# Patient Record
Sex: Female | Born: 1935 | Race: White | Hispanic: No | Smoking: Former smoker
Health system: Southern US, Community
[De-identification: ages and names within clinical notes are randomized; demographics above are authoritative.]

## PROBLEM LIST (undated history)

## (undated) DIAGNOSIS — I5032 Chronic diastolic (congestive) heart failure: Secondary | ICD-10-CM

## (undated) DIAGNOSIS — Z8709 Personal history of other diseases of the respiratory system: Secondary | ICD-10-CM

## (undated) DIAGNOSIS — E7800 Pure hypercholesterolemia, unspecified: Secondary | ICD-10-CM

## (undated) DIAGNOSIS — C50919 Malignant neoplasm of unspecified site of unspecified female breast: Secondary | ICD-10-CM

## (undated) DIAGNOSIS — I509 Heart failure, unspecified: Secondary | ICD-10-CM

## (undated) DIAGNOSIS — IMO0001 Reserved for inherently not codable concepts without codable children: Secondary | ICD-10-CM

## (undated) DIAGNOSIS — R918 Other nonspecific abnormal finding of lung field: Secondary | ICD-10-CM

## (undated) DIAGNOSIS — R4189 Other symptoms and signs involving cognitive functions and awareness: Secondary | ICD-10-CM

## (undated) DIAGNOSIS — J90 Pleural effusion, not elsewhere classified: Secondary | ICD-10-CM

## (undated) DIAGNOSIS — R011 Cardiac murmur, unspecified: Secondary | ICD-10-CM

## (undated) DIAGNOSIS — I639 Cerebral infarction, unspecified: Secondary | ICD-10-CM

## (undated) DIAGNOSIS — R202 Paresthesia of skin: Secondary | ICD-10-CM

## (undated) DIAGNOSIS — R200 Anesthesia of skin: Secondary | ICD-10-CM

## (undated) HISTORY — PX: CORONARY ANGIOPLASTY WITH STENT PLACEMENT: SHX49

## (undated) HISTORY — PX: TONSILLECTOMY: SUR1361

## (undated) HISTORY — PX: COLONOSCOPY: SHX174

## (undated) HISTORY — DX: Chronic diastolic (congestive) heart failure: I50.32

## (undated) HISTORY — PX: CATARACT EXTRACTION: SUR2

## (undated) HISTORY — PX: FOOT SURGERY: SHX648

## (undated) HISTORY — DX: Malignant neoplasm of unspecified site of unspecified female breast: C50.919

---

## 1974-12-22 HISTORY — PX: ABDOMINAL HYSTERECTOMY: SHX81

## 1983-12-23 HISTORY — PX: MASTECTOMY: SHX3

## 2012-12-22 DIAGNOSIS — I639 Cerebral infarction, unspecified: Secondary | ICD-10-CM

## 2012-12-22 HISTORY — DX: Cerebral infarction, unspecified: I63.9

## 2014-12-26 DIAGNOSIS — K5900 Constipation, unspecified: Secondary | ICD-10-CM | POA: Diagnosis not present

## 2014-12-26 DIAGNOSIS — J90 Pleural effusion, not elsewhere classified: Secondary | ICD-10-CM | POA: Diagnosis not present

## 2014-12-26 DIAGNOSIS — R51 Headache: Secondary | ICD-10-CM | POA: Diagnosis not present

## 2014-12-26 DIAGNOSIS — I639 Cerebral infarction, unspecified: Secondary | ICD-10-CM | POA: Diagnosis not present

## 2014-12-26 DIAGNOSIS — E785 Hyperlipidemia, unspecified: Secondary | ICD-10-CM | POA: Diagnosis not present

## 2014-12-26 DIAGNOSIS — R918 Other nonspecific abnormal finding of lung field: Secondary | ICD-10-CM | POA: Diagnosis not present

## 2014-12-26 DIAGNOSIS — R05 Cough: Secondary | ICD-10-CM | POA: Diagnosis not present

## 2015-01-23 DIAGNOSIS — N390 Urinary tract infection, site not specified: Secondary | ICD-10-CM | POA: Diagnosis not present

## 2015-01-23 DIAGNOSIS — I10 Essential (primary) hypertension: Secondary | ICD-10-CM | POA: Diagnosis not present

## 2015-02-06 DIAGNOSIS — B373 Candidiasis of vulva and vagina: Secondary | ICD-10-CM | POA: Diagnosis not present

## 2015-02-06 DIAGNOSIS — R300 Dysuria: Secondary | ICD-10-CM | POA: Diagnosis not present

## 2015-02-06 DIAGNOSIS — N390 Urinary tract infection, site not specified: Secondary | ICD-10-CM | POA: Diagnosis not present

## 2015-02-06 DIAGNOSIS — R609 Edema, unspecified: Secondary | ICD-10-CM | POA: Diagnosis not present

## 2015-02-14 DIAGNOSIS — N390 Urinary tract infection, site not specified: Secondary | ICD-10-CM | POA: Diagnosis not present

## 2015-02-14 DIAGNOSIS — N3000 Acute cystitis without hematuria: Secondary | ICD-10-CM | POA: Diagnosis not present

## 2015-02-27 DIAGNOSIS — N390 Urinary tract infection, site not specified: Secondary | ICD-10-CM | POA: Diagnosis not present

## 2015-07-03 DIAGNOSIS — Z1231 Encounter for screening mammogram for malignant neoplasm of breast: Secondary | ICD-10-CM | POA: Diagnosis not present

## 2015-07-03 DIAGNOSIS — I639 Cerebral infarction, unspecified: Secondary | ICD-10-CM | POA: Diagnosis not present

## 2015-07-03 DIAGNOSIS — K5909 Other constipation: Secondary | ICD-10-CM | POA: Diagnosis not present

## 2015-07-03 DIAGNOSIS — J984 Other disorders of lung: Secondary | ICD-10-CM | POA: Diagnosis not present

## 2015-07-26 DIAGNOSIS — R269 Unspecified abnormalities of gait and mobility: Secondary | ICD-10-CM | POA: Diagnosis not present

## 2015-07-26 DIAGNOSIS — R531 Weakness: Secondary | ICD-10-CM | POA: Diagnosis not present

## 2015-07-26 DIAGNOSIS — Z5189 Encounter for other specified aftercare: Secondary | ICD-10-CM | POA: Diagnosis not present

## 2015-07-31 DIAGNOSIS — R531 Weakness: Secondary | ICD-10-CM | POA: Diagnosis not present

## 2015-07-31 DIAGNOSIS — Z5189 Encounter for other specified aftercare: Secondary | ICD-10-CM | POA: Diagnosis not present

## 2015-07-31 DIAGNOSIS — R269 Unspecified abnormalities of gait and mobility: Secondary | ICD-10-CM | POA: Diagnosis not present

## 2015-08-03 DIAGNOSIS — R269 Unspecified abnormalities of gait and mobility: Secondary | ICD-10-CM | POA: Diagnosis not present

## 2015-08-03 DIAGNOSIS — Z5189 Encounter for other specified aftercare: Secondary | ICD-10-CM | POA: Diagnosis not present

## 2015-08-03 DIAGNOSIS — R531 Weakness: Secondary | ICD-10-CM | POA: Diagnosis not present

## 2015-08-07 DIAGNOSIS — Z5189 Encounter for other specified aftercare: Secondary | ICD-10-CM | POA: Diagnosis not present

## 2015-08-07 DIAGNOSIS — R531 Weakness: Secondary | ICD-10-CM | POA: Diagnosis not present

## 2015-08-07 DIAGNOSIS — R269 Unspecified abnormalities of gait and mobility: Secondary | ICD-10-CM | POA: Diagnosis not present

## 2015-08-10 DIAGNOSIS — R531 Weakness: Secondary | ICD-10-CM | POA: Diagnosis not present

## 2015-08-10 DIAGNOSIS — Z5189 Encounter for other specified aftercare: Secondary | ICD-10-CM | POA: Diagnosis not present

## 2015-08-10 DIAGNOSIS — R269 Unspecified abnormalities of gait and mobility: Secondary | ICD-10-CM | POA: Diagnosis not present

## 2015-08-13 DIAGNOSIS — J984 Other disorders of lung: Secondary | ICD-10-CM | POA: Diagnosis not present

## 2015-08-13 DIAGNOSIS — I712 Thoracic aortic aneurysm, without rupture: Secondary | ICD-10-CM | POA: Diagnosis not present

## 2015-08-13 DIAGNOSIS — J181 Lobar pneumonia, unspecified organism: Secondary | ICD-10-CM | POA: Diagnosis not present

## 2015-08-13 DIAGNOSIS — J90 Pleural effusion, not elsewhere classified: Secondary | ICD-10-CM | POA: Diagnosis not present

## 2015-08-14 DIAGNOSIS — Z5189 Encounter for other specified aftercare: Secondary | ICD-10-CM | POA: Diagnosis not present

## 2015-08-14 DIAGNOSIS — R531 Weakness: Secondary | ICD-10-CM | POA: Diagnosis not present

## 2015-08-14 DIAGNOSIS — R269 Unspecified abnormalities of gait and mobility: Secondary | ICD-10-CM | POA: Diagnosis not present

## 2015-08-21 DIAGNOSIS — J984 Other disorders of lung: Secondary | ICD-10-CM | POA: Diagnosis not present

## 2015-08-28 DIAGNOSIS — D190 Benign neoplasm of mesothelial tissue of pleura: Secondary | ICD-10-CM | POA: Diagnosis not present

## 2015-08-28 DIAGNOSIS — R918 Other nonspecific abnormal finding of lung field: Secondary | ICD-10-CM | POA: Diagnosis not present

## 2015-08-28 DIAGNOSIS — J90 Pleural effusion, not elsewhere classified: Secondary | ICD-10-CM | POA: Diagnosis not present

## 2015-12-31 DIAGNOSIS — Z8673 Personal history of transient ischemic attack (TIA), and cerebral infarction without residual deficits: Secondary | ICD-10-CM | POA: Diagnosis not present

## 2015-12-31 DIAGNOSIS — E7800 Pure hypercholesterolemia, unspecified: Secondary | ICD-10-CM | POA: Diagnosis not present

## 2015-12-31 DIAGNOSIS — R011 Cardiac murmur, unspecified: Secondary | ICD-10-CM | POA: Diagnosis not present

## 2016-03-03 ENCOUNTER — Encounter: Payer: Self-pay | Admitting: Hematology & Oncology

## 2016-03-03 ENCOUNTER — Other Ambulatory Visit (HOSPITAL_BASED_OUTPATIENT_CLINIC_OR_DEPARTMENT_OTHER): Payer: Medicare Other

## 2016-03-03 ENCOUNTER — Ambulatory Visit (HOSPITAL_BASED_OUTPATIENT_CLINIC_OR_DEPARTMENT_OTHER): Payer: Medicare Other | Admitting: Hematology & Oncology

## 2016-03-03 ENCOUNTER — Ambulatory Visit: Payer: Medicare Other

## 2016-03-03 VITALS — BP 146/71 | HR 77 | Temp 97.3°F | Resp 16 | Ht 63.0 in | Wt 134.0 lb

## 2016-03-03 DIAGNOSIS — M818 Other osteoporosis without current pathological fracture: Secondary | ICD-10-CM

## 2016-03-03 DIAGNOSIS — C50912 Malignant neoplasm of unspecified site of left female breast: Secondary | ICD-10-CM

## 2016-03-03 DIAGNOSIS — Z853 Personal history of malignant neoplasm of breast: Secondary | ICD-10-CM

## 2016-03-03 DIAGNOSIS — R899 Unspecified abnormal finding in specimens from other organs, systems and tissues: Secondary | ICD-10-CM

## 2016-03-03 DIAGNOSIS — I427 Cardiomyopathy due to drug and external agent: Secondary | ICD-10-CM

## 2016-03-03 DIAGNOSIS — J90 Pleural effusion, not elsewhere classified: Secondary | ICD-10-CM | POA: Diagnosis not present

## 2016-03-03 DIAGNOSIS — T386X5A Adverse effect of antigonadotrophins, antiestrogens, antiandrogens, not elsewhere classified, initial encounter: Secondary | ICD-10-CM

## 2016-03-03 DIAGNOSIS — T451X5A Adverse effect of antineoplastic and immunosuppressive drugs, initial encounter: Secondary | ICD-10-CM

## 2016-03-03 LAB — COMPREHENSIVE METABOLIC PANEL
ALT: 9 U/L (ref 0–55)
ANION GAP: 8 meq/L (ref 3–11)
AST: 15 U/L (ref 5–34)
Albumin: 3.4 g/dL — ABNORMAL LOW (ref 3.5–5.0)
Alkaline Phosphatase: 79 U/L (ref 40–150)
BUN: 14.2 mg/dL (ref 7.0–26.0)
CALCIUM: 9.1 mg/dL (ref 8.4–10.4)
CHLORIDE: 103 meq/L (ref 98–109)
CO2: 28 meq/L (ref 22–29)
CREATININE: 1.1 mg/dL (ref 0.6–1.1)
EGFR: 46 mL/min/{1.73_m2} — AB (ref >90)
Glucose: 120 mg/dl (ref 70–140)
Potassium: 4.1 mEq/L (ref 3.5–5.1)
Sodium: 139 mEq/L (ref 136–145)
Total Bilirubin: 0.37 mg/dL (ref 0.20–1.20)
Total Protein: 7.2 g/dL (ref 6.4–8.3)

## 2016-03-03 LAB — CBC WITH DIFFERENTIAL (CANCER CENTER ONLY)
BASO#: 0 10*3/uL (ref 0.0–0.2)
BASO%: 0.4 % (ref 0.0–2.0)
EOS ABS: 0.3 10*3/uL (ref 0.0–0.5)
EOS%: 3.5 % (ref 0.0–7.0)
HCT: 41.4 % (ref 34.8–46.6)
HGB: 13.5 g/dL (ref 11.6–15.9)
LYMPH#: 0.7 10*3/uL — ABNORMAL LOW (ref 0.9–3.3)
LYMPH%: 9.3 % — AB (ref 14.0–48.0)
MCH: 29 pg (ref 26.0–34.0)
MCHC: 32.6 g/dL (ref 32.0–36.0)
MCV: 89 fL (ref 81–101)
MONO#: 0.6 10*3/uL (ref 0.1–0.9)
MONO%: 8.6 % (ref 0.0–13.0)
NEUT#: 5.6 10*3/uL (ref 1.5–6.5)
NEUT%: 78.2 % (ref 39.6–80.0)
PLATELETS: 307 10*3/uL (ref 145–400)
RBC: 4.66 10*6/uL (ref 3.70–5.32)
RDW: 13.7 % (ref 11.1–15.7)
WBC: 7.1 10*3/uL (ref 3.9–10.0)

## 2016-03-03 LAB — LACTATE DEHYDROGENASE: LDH: 171 U/L (ref 125–245)

## 2016-03-03 NOTE — Progress Notes (Signed)
Referral MD  Reason for Referral: Left pleural effusion. History of left breast cancer in 1985   Chief Complaint  Patient presents with  . OTHER    New Patient  : I just moved down here from [place].  HPI: Leslie Carr is a very charming 82-year-old white female. She has a very interesting history. She had breast cancer back in 1985. At that time, she had a mastectomy. She says she took chemotherapy and radiation therapy.  She has moved to different places. She was up in [place] when she had a CT scan done. I'm still not sure as to why the scan was done. She is not having any symptoms of shortness of breath. However, the CT scan showed that there was a significant left pleural effusion.  She had a left thoracentesis. This is back in August 2016. The procedure through out 700 mL of fluid. Cytology on the fluid was negative for any obvious malignancy. However, the pathologist noted some "abnormal" T cells. On flow cytometry, there is no monoclonal population of cells. Even the pathologist was not sure what this T-cell population signified.  Because this, when she moved down to [place], her family doctor felt that this needed to be followed up by oncology. As such, we are seeing her for this.  She denies any shortness of breath. She denies any chest pain. Her appetite is doing okay. She has lost a little bit of weight.  She's had no nausea or vomiting. She's had no cough. She's had no bony pain. She's had no leg swelling. She's had no rashes.  Overall, her performance status is ECOG 0.     Past Medical History  Diagnosis Date  . Breast cancer ([place])   :  No past surgical history on file.:   Current outpatient prescriptions:  .  aspirin 81 MG tablet, Take 81 mg by mouth daily., Disp: , Rfl:  .  pravastatin (PRAVACHOL) 20 MG tablet, Take 20 mg by mouth daily., Disp: , Rfl: :  :  Not on File:  No family history on file.:  Social History   Social History  .  Marital Status: Divorced    Spouse Name: N/A  . Number of Children: N/A  . Years of Education: N/A   Occupational History  . Not on file.   Social History Main Topics  . Smoking status: Former Research scientist (life sciences)  . Smokeless tobacco: Not on file     Comment: Quit over 40 years ago  . Alcohol Use: Not on file  . Drug Use: Not on file  . Sexual Activity: Not on file   Other Topics Concern  . Not on file   Social History Narrative  . No narrative on file  :  Pertinent items are noted in HPI.  Exam: @[facility] @ Elderly but well nourished white female in no obvious distress. Vital signs show temperature of 97.3. All 77. Low pressure 146/71. Weight is 134 pounds. Head and neck exam shows no ocular or oral lesions. Are no palpable cervical or supraclavicular lymph nodes. Lungs show clear breath sounds bilaterally. Cardiac exam regular rate and rhythm with a 2/6 systolic ejection murmur. Breast exam shows right breast with no masses, edema or erythema. There is no right axillary adenopathy. Left chest wall shows well-healed mastectomy. No left chest wall nodules are noted. There is no left axillary adenopathy. Abdomen is soft. She has good bowel sounds. There is no fluid wave. There is no palpable liver or spleen tip. Back exam  shows no tenderness over the spine, ribs or hips. She has some slight osteoporotic changes. Extremities shows no clubbing, cyanosis or edema. No lymphedema is noted in the left arm. She has marked decreased range of motion of the left shoulder. Skin exam shows no rashes, ecchymoses or petechia. Neurological exam shows no focal neurological deficits.   Recent Labs  03/03/16 0946  WBC 7.1  HGB 13.5  HCT 41.4  PLT 307    Recent Labs  03/03/16 0946  NA 139  K 4.1  CO2 28  GLUCOSE 120  BUN 14.2  CREATININE 1.1  CALCIUM 9.1    Blood smear review: None  Pathology: None     Assessment and Plan:  Leslie Carr is an 82 year old white female with a left pleural effusion.  It just surprised me how this was not further evaluated. I guess she must have decided not to go back to see the doctor.  I have no clue what this "abnormal T-cell population" means. I cannot imagine that this is malignant.  I suspect that she probably has some cardiac issues. Given the fact that she had radiation back in 1985, I suspect her heart took a "hit" from the radiation and that she may have some cardiomyopathy. She really needs to have an echocardiogram done.  She does need to have a CT scan done. I believe this is imperative so we can look at this left pleural space again. If she does have recurrence of the effusion, and another thoracentesis would be reasonable. I would not put her through a thoracoscopic procedure right now.  She needs to have a bone density test done. She is not taking vitamin D. She needs to be on 2000 units daily of vitamin D.  I spent about 45 minutes with she and her daughter. They're both very nice. It was nice talking to her. She has a good performance status.  I want see her back in 2 months. It is anything on the scans that are suspicious, then we will get her back sooner.

## 2016-03-04 ENCOUNTER — Telehealth: Payer: Self-pay | Admitting: *Deleted

## 2016-03-04 ENCOUNTER — Ambulatory Visit (HOSPITAL_BASED_OUTPATIENT_CLINIC_OR_DEPARTMENT_OTHER)
Admission: RE | Admit: 2016-03-04 | Discharge: 2016-03-04 | Disposition: A | Discharge status: Home / Self Care | Payer: Medicare Other | Source: Ambulatory Visit | Attending: Hematology & Oncology | Admitting: Hematology & Oncology

## 2016-03-04 ENCOUNTER — Other Ambulatory Visit: Payer: Self-pay | Admitting: Hematology & Oncology

## 2016-03-04 ENCOUNTER — Encounter (HOSPITAL_BASED_OUTPATIENT_CLINIC_OR_DEPARTMENT_OTHER): Payer: Self-pay

## 2016-03-04 DIAGNOSIS — R05 Cough: Secondary | ICD-10-CM | POA: Diagnosis not present

## 2016-03-04 DIAGNOSIS — J90 Pleural effusion, not elsewhere classified: Secondary | ICD-10-CM | POA: Diagnosis not present

## 2016-03-04 DIAGNOSIS — T386X5A Adverse effect of antigonadotrophins, antiestrogens, antiandrogens, not elsewhere classified, initial encounter: Secondary | ICD-10-CM

## 2016-03-04 DIAGNOSIS — T451X5A Adverse effect of antineoplastic and immunosuppressive drugs, initial encounter: Secondary | ICD-10-CM | POA: Diagnosis not present

## 2016-03-04 DIAGNOSIS — M818 Other osteoporosis without current pathological fracture: Secondary | ICD-10-CM

## 2016-03-04 DIAGNOSIS — I427 Cardiomyopathy due to drug and external agent: Secondary | ICD-10-CM | POA: Diagnosis not present

## 2016-03-04 DIAGNOSIS — Z1231 Encounter for screening mammogram for malignant neoplasm of breast: Secondary | ICD-10-CM

## 2016-03-04 DIAGNOSIS — C50912 Malignant neoplasm of unspecified site of left female breast: Secondary | ICD-10-CM | POA: Insufficient documentation

## 2016-03-04 DIAGNOSIS — I2510 Atherosclerotic heart disease of native coronary artery without angina pectoris: Secondary | ICD-10-CM | POA: Diagnosis not present

## 2016-03-04 DIAGNOSIS — Z853 Personal history of malignant neoplasm of breast: Secondary | ICD-10-CM

## 2016-03-04 DIAGNOSIS — R899 Unspecified abnormal finding in specimens from other organs, systems and tissues: Secondary | ICD-10-CM

## 2016-03-04 DIAGNOSIS — R918 Other nonspecific abnormal finding of lung field: Secondary | ICD-10-CM | POA: Insufficient documentation

## 2016-03-04 HISTORY — DX: Heart failure, unspecified: I50.9

## 2016-03-04 MED ORDER — IOHEXOL 300 MG/ML  SOLN
100.0000 mL | Freq: Once | INTRAMUSCULAR | Status: AC | PRN
  Administered 2016-03-04: 100 mL via INTRAVENOUS

## 2016-03-04 NOTE — Telephone Encounter (Addendum)
Patient aware of results  ----- Message from Volanda Napoleon, MD sent at 03/03/2016  4:38 PM EDT ----- Call - the chemical studies (liver, kidney, sodium, calcium, etc..) look great!!  pete

## 2016-03-05 ENCOUNTER — Other Ambulatory Visit: Payer: Self-pay | Admitting: Hematology & Oncology

## 2016-03-05 DIAGNOSIS — J90 Pleural effusion, not elsewhere classified: Secondary | ICD-10-CM

## 2016-03-07 ENCOUNTER — Other Ambulatory Visit: Payer: Self-pay | Admitting: Radiology

## 2016-03-07 ENCOUNTER — Ambulatory Visit (HOSPITAL_COMMUNITY)
Admission: RE | Admit: 2016-03-07 | Discharge: 2016-03-07 | Disposition: A | Discharge status: Home / Self Care | Payer: Medicare Other | Source: Ambulatory Visit | Attending: Hematology & Oncology | Admitting: Hematology & Oncology

## 2016-03-07 ENCOUNTER — Ambulatory Visit (HOSPITAL_COMMUNITY)
Admission: RE | Admit: 2016-03-07 | Discharge: 2016-03-07 | Disposition: A | Discharge status: Home / Self Care | Payer: Medicare Other | Source: Ambulatory Visit | Attending: Radiology | Admitting: Radiology

## 2016-03-07 ENCOUNTER — Other Ambulatory Visit (HOSPITAL_BASED_OUTPATIENT_CLINIC_OR_DEPARTMENT_OTHER): Payer: Medicare Other

## 2016-03-07 DIAGNOSIS — Z9889 Other specified postprocedural states: Secondary | ICD-10-CM | POA: Diagnosis not present

## 2016-03-07 DIAGNOSIS — M818 Other osteoporosis without current pathological fracture: Secondary | ICD-10-CM | POA: Insufficient documentation

## 2016-03-07 DIAGNOSIS — J90 Pleural effusion, not elsewhere classified: Secondary | ICD-10-CM

## 2016-03-07 DIAGNOSIS — Z79899 Other long term (current) drug therapy: Secondary | ICD-10-CM | POA: Insufficient documentation

## 2016-03-07 DIAGNOSIS — T451X5A Adverse effect of antineoplastic and immunosuppressive drugs, initial encounter: Secondary | ICD-10-CM | POA: Insufficient documentation

## 2016-03-07 DIAGNOSIS — Z794 Long term (current) use of insulin: Secondary | ICD-10-CM | POA: Diagnosis not present

## 2016-03-07 DIAGNOSIS — R918 Other nonspecific abnormal finding of lung field: Secondary | ICD-10-CM | POA: Insufficient documentation

## 2016-03-07 DIAGNOSIS — I509 Heart failure, unspecified: Secondary | ICD-10-CM | POA: Diagnosis not present

## 2016-03-07 DIAGNOSIS — I5189 Other ill-defined heart diseases: Secondary | ICD-10-CM | POA: Diagnosis not present

## 2016-03-07 DIAGNOSIS — J948 Other specified pleural conditions: Secondary | ICD-10-CM | POA: Diagnosis not present

## 2016-03-07 DIAGNOSIS — R899 Unspecified abnormal finding in specimens from other organs, systems and tissues: Secondary | ICD-10-CM | POA: Diagnosis not present

## 2016-03-07 DIAGNOSIS — I427 Cardiomyopathy due to drug and external agent: Secondary | ICD-10-CM | POA: Diagnosis not present

## 2016-03-07 DIAGNOSIS — T386X5A Adverse effect of antigonadotrophins, antiestrogens, antiandrogens, not elsewhere classified, initial encounter: Secondary | ICD-10-CM | POA: Insufficient documentation

## 2016-03-07 DIAGNOSIS — Z7982 Long term (current) use of aspirin: Secondary | ICD-10-CM | POA: Diagnosis not present

## 2016-03-07 DIAGNOSIS — I517 Cardiomegaly: Secondary | ICD-10-CM | POA: Diagnosis not present

## 2016-03-07 DIAGNOSIS — C50912 Malignant neoplasm of unspecified site of left female breast: Secondary | ICD-10-CM | POA: Diagnosis not present

## 2016-03-07 DIAGNOSIS — E785 Hyperlipidemia, unspecified: Secondary | ICD-10-CM | POA: Insufficient documentation

## 2016-03-07 MED ORDER — LIDOCAINE HCL (PF) 1 % IJ SOLN
INTRAMUSCULAR | Status: AC
  Filled 2016-03-07: qty 10

## 2016-03-07 NOTE — Procedures (Signed)
   US guided Left thoracentesis  950 cc dark yellow Sent for labs per MD  cxr pending Pt tolerated well

## 2016-03-07 NOTE — Progress Notes (Signed)
  Echocardiogram 2D Echocardiogram has been performed.  Diamond Nickel 03/07/2016, 12:23 PM

## 2016-03-08 ENCOUNTER — Ambulatory Visit (HOSPITAL_COMMUNITY)
Admission: RE | Admit: 2016-03-08 | Discharge: 2016-03-08 | Disposition: A | Discharge status: Home / Self Care | Payer: Medicare Other | Source: Ambulatory Visit | Attending: Radiology | Admitting: Radiology

## 2016-03-08 ENCOUNTER — Other Ambulatory Visit: Payer: Self-pay | Admitting: Physician Assistant

## 2016-03-08 ENCOUNTER — Ambulatory Visit (HOSPITAL_COMMUNITY)
Admission: AD | Admit: 2016-03-08 | Discharge: 2016-03-08 | Disposition: A | Discharge status: Home / Self Care | Payer: Medicare Other | Source: Ambulatory Visit | Attending: Radiology | Admitting: Radiology

## 2016-03-08 DIAGNOSIS — J90 Pleural effusion, not elsewhere classified: Secondary | ICD-10-CM

## 2016-03-08 DIAGNOSIS — J948 Other specified pleural conditions: Secondary | ICD-10-CM | POA: Diagnosis not present

## 2016-03-08 DIAGNOSIS — J939 Pneumothorax, unspecified: Secondary | ICD-10-CM | POA: Diagnosis not present

## 2016-03-08 NOTE — Progress Notes (Signed)
Referring Physician(s): Burney Gauze  Supervising Physician: Markus Daft  Chief Complaint:  F/u after thoracentesis 03/07/2016  Subjective:  Leslie Carr underwent a thoracentesis yesterday by Jannifer Franklin, PA-C  There was incomplete re-expansion of the left lung most likely due to chronic cortical scarring  She was instructed to return today for repeat CXR to make sure she was table.  CXR revealed increase in left hydropneumothorax since the previous day's exam.  Leslie Carr is completely comfortable.  She denies any SOB or dyspnea on exertion.   Her daughter is a Marine scientist and is with her today.   She reports "I just walked all the way in from the car and I was fine".  Allergies: Lipitor  Medications: Prior to Admission medications   Medication Sig Start Date End Date Taking? Authorizing Provider  aspirin 81 MG tablet Take 81 mg by mouth daily.    Historical Provider, MD  pravastatin (PRAVACHOL) 20 MG tablet Take 20 mg by mouth daily.    Historical Provider, MD     Vital Signs: There were no vitals taken for this visit.  Physical Exam  Awake and alert NAD Lung clear bilaterally with breath sounds up to apices and actually clear at both bases Heart RRR  Imaging: Dg Chest 1 View  03/08/2016  CLINICAL DATA:  Pneumothorax post thoracentesis. Recheck small pneumo post left thoracentesis from yesterday. Pt states that she is having improved breathing and energy EXAM: CHEST  1 VIEW COMPARISON:  the previous day's study FINDINGS: Left hydropneumothorax, appearing marginally increased since previous exam. Filling apex projects at the level of the left fourth rib posteriorly. There is no midline shift. Nodular poorly marginated opacity in the right mid lung stable. Heart size within normal limits for technique. Atheromatous aorta. Advanced degenerative change of the left shoulder. IMPRESSION: 1. Slight increase in left hydropneumothorax since previous day's exam. Patient will be  examined and evaluated. Electronically Signed   By: Lucrezia Europe M.D.   On: 03/08/2016 09:53   Dg Chest 1 View  03/07/2016  CLINICAL DATA:  Status post left-sided thoracentesis. EXAM: CHEST 1 VIEW COMPARISON:  CT scan of March 04, 2016. FINDINGS: Mild cardiomegaly is noted. Atherosclerosis of descending thoracic aorta is noted. Minimal left apical and mild left basilar pneumothorax is noted most likely due to incomplete re-expansion of the lung secondary to cortical scarring status post thoracentesis. Nodular mass is noted in the right middle lobe as described on prior CT scan. Left plural effusion is significantly smaller compared to prior exam. Bony thorax is unremarkable. IMPRESSION: Left pleural effusion is significantly smaller status post left thoracentesis. Incomplete re-expansion of the left lung is noted most likely due to chronic cortical scarring. Right middle lobe mass is noted as described on prior CT scan. Electronically Signed   By: Marijo Conception, M.D.   On: 03/07/2016 14:09   Ct Chest W Contrast  03/04/2016  CLINICAL DATA:  82 year old female with history of left-sided breast cancer. Cough now improving. Abnormal pleural fluid. Followup study. EXAM: CT CHEST, ABDOMEN, AND PELVIS WITH CONTRAST TECHNIQUE: Multidetector CT imaging of the chest, abdomen and pelvis was performed following the standard protocol during bolus administration of intravenous contrast. CONTRAST:  173mL OMNIPAQUE IOHEXOL 300 MG/ML  SOLN COMPARISON:  CT the abdomen and pelvis 11/05/2006. FINDINGS: CT CHEST FINDINGS Mediastinum/Lymph Nodes: Heart size is normal. There is no significant pericardial fluid, thickening or pericardial calcification. There is atherosclerosis of the thoracic aorta, the great vessels of the mediastinum and  the coronary arteries, including calcified atherosclerotic plaque in the left anterior descending and right coronary arteries. In addition, there is mild aneurysmal dilatation of the ascending  thoracic aorta (4.5 cm in diameter). Extensive atheromatous plaque is noted in the left subclavian artery, where there is potentially hemodynamically significant stenosis as the vessel traverses the thoracic inlet. Thickening calcification of the aortic valve, mitral valve and mitral annulus. Esophagus is unremarkable in appearance. No axillary lymphadenopathy. Lungs/Pleura: Large left pleural effusion which appears to be partially loculated anteriorly. Additionally, there are areas of pleural thickening and enhancement, suggestive of a malignant pleural effusion. There is near complete atelectasis of the left upper lobe. Partial subsegmental atelectasis of the left lower lobe. New mass-like lesion in the lateral segment of the right middle lobe measuring 1.7 x 3.1 x 1.7 cm (image 37 of series 3 and coronal image 69 of series 4). 3 mm right lower lobe pulmonary nodule (image 47 of series 3) is new compared to the prior study, but nonspecific. Musculoskeletal/Soft Tissues: There are no aggressive appearing lytic or blastic lesions noted in the visualized portions of the skeleton. CT ABDOMEN AND PELVIS FINDINGS Hepatobiliary: No cystic or solid hepatic lesions. No intra or extrahepatic biliary ductal dilatation. Gallbladder is normal in appearance. Pancreas: No pancreatic mass. No pancreatic ductal dilatation. No pancreatic or peripancreatic fluid or inflammatory changes. Spleen: Unremarkable. Adrenals/Urinary Tract: Bilateral adrenal glands and bilateral kidneys are normal in appearance. No hydroureteronephrosis. Urinary bladder is normal in appearance. Stomach/Bowel: Stomach is normal in appearance. No pathologic dilatation of small bowel or colon. Vascular/Lymphatic: Atherosclerosis throughout the abdominal and pelvic vasculature, including focal saccular aneurysmal dilatation of the immediate infrarenal abdominal aorta which measures up to 3.5 x 3.2 cm. No lymphadenopathy noted in the abdomen or pelvis.  Reproductive: Status post hysterectomy. Ovaries are not confidently identified may be surgically absent or atrophic. Other: No significant volume of ascites.  No pneumoperitoneum. Musculoskeletal: There are no aggressive appearing lytic or blastic lesions noted in the visualized portions of the skeleton. IMPRESSION: 1. Large left pleural effusion with extensive pleural thickening and enhancement, concerning for a malignant pleural effusion. Correlation with thoracentesis is suggested. This is associated with extensive passive atelectasis in the left lung, as discussed above. 2. New mass-like area in the lateral segment of the right middle lobe measuring 1.7 x 3.1 x 1.7 cm. Differential considerations include a primary bronchogenic neoplasm, focus of infection/inflammation, or a new pulmonary metastasis. Correlation with PET-CT and/or biopsy should be considered if clinically appropriate. 3. Atherosclerosis, including 2 vessel coronary artery disease. 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 Recommend followup by ultrasound in 3 years. This recommendation follows ACR consensus guidelines: White Paper of the ACR Incidental Findings Committee II on Vascular Findings. J Am Coll Radiol 2013; 10:789-794. 4. Additional incidental findings, as above. Electronically Signed   By: Vinnie Langton M.D.   On: 03/04/2016 14:31   Ct Abdomen Pelvis W Contrast  03/04/2016  CLINICAL DATA:  82 year old female with history of left-sided breast cancer. Cough now improving. Abnormal pleural fluid. Followup study. EXAM: CT CHEST, ABDOMEN, AND PELVIS WITH CONTRAST TECHNIQUE: Multidetector CT imaging of the chest, abdomen and pelvis was performed following the standard protocol during bolus administration of intravenous contrast. CONTRAST:  165mL OMNIPAQUE IOHEXOL 300 MG/ML  SOLN COMPARISON:  CT the abdomen and  pelvis 11/05/2006. FINDINGS: CT CHEST FINDINGS Mediastinum/Lymph Nodes: Heart size  is normal. There is no significant pericardial fluid, thickening or pericardial calcification. There is atherosclerosis of the thoracic aorta, the great vessels of the mediastinum and the coronary arteries, including calcified atherosclerotic plaque in the left anterior descending and right coronary arteries. In addition, there is mild aneurysmal dilatation of the ascending thoracic aorta (4.5 cm in diameter). Extensive atheromatous plaque is noted in the left subclavian artery, where there is potentially hemodynamically significant stenosis as the vessel traverses the thoracic inlet. Thickening calcification of the aortic valve, mitral valve and mitral annulus. Esophagus is unremarkable in appearance. No axillary lymphadenopathy. Lungs/Pleura: Large left pleural effusion which appears to be partially loculated anteriorly. Additionally, there are areas of pleural thickening and enhancement, suggestive of a malignant pleural effusion. There is near complete atelectasis of the left upper lobe. Partial subsegmental atelectasis of the left lower lobe. New mass-like lesion in the lateral segment of the right middle lobe measuring 1.7 x 3.1 x 1.7 cm (image 37 of series 3 and coronal image 69 of series 4). 3 mm right lower lobe pulmonary nodule (image 47 of series 3) is new compared to the prior study, but nonspecific. Musculoskeletal/Soft Tissues: There are no aggressive appearing lytic or blastic lesions noted in the visualized portions of the skeleton. CT ABDOMEN AND PELVIS FINDINGS Hepatobiliary: No cystic or solid hepatic lesions. No intra or extrahepatic biliary ductal dilatation. Gallbladder is normal in appearance. Pancreas: No pancreatic mass. No pancreatic ductal dilatation. No pancreatic or peripancreatic fluid or inflammatory changes. Spleen: Unremarkable. Adrenals/Urinary Tract: Bilateral adrenal glands and bilateral kidneys are  normal in appearance. No hydroureteronephrosis. Urinary bladder is normal in appearance. Stomach/Bowel: Stomach is normal in appearance. No pathologic dilatation of small bowel or colon. Vascular/Lymphatic: Atherosclerosis throughout the abdominal and pelvic vasculature, including focal saccular aneurysmal dilatation of the immediate infrarenal abdominal aorta which measures up to 3.5 x 3.2 cm. No lymphadenopathy noted in the abdomen or pelvis. Reproductive: Status post hysterectomy. Ovaries are not confidently identified may be surgically absent or atrophic. Other: No significant volume of ascites.  No pneumoperitoneum. Musculoskeletal: There are no aggressive appearing lytic or blastic lesions noted in the visualized portions of the skeleton. IMPRESSION: 1. Large left pleural effusion with extensive pleural thickening and enhancement, concerning for a malignant pleural effusion. Correlation with thoracentesis is suggested. This is associated with extensive passive atelectasis in the left lung, as discussed above. 2. New mass-like area in the lateral segment of the right middle lobe measuring 1.7 x 3.1 x 1.7 cm. Differential considerations include a primary bronchogenic neoplasm, focus of infection/inflammation, or a new pulmonary metastasis. Correlation with PET-CT and/or biopsy should be considered if clinically appropriate. 3. Atherosclerosis, including 2 vessel coronary artery disease. 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 Recommend followup by ultrasound in 3 years. This recommendation follows ACR consensus guidelines: White Paper of the ACR Incidental Findings Committee II on Vascular Findings. J Am Coll Radiol 2013; 10:789-794. 4. Additional incidental findings, as above. Electronically Signed   By: Vinnie Langton M.D.   On: 03/04/2016 14:31   US Thoracentesis Asp Pleural Space  W/img Guide  03/07/2016  INDICATION: Symptomatic left sided pleural effusion EXAM: US THORACENTESIS ASP PLEURAL SPACE W/IMG GUIDE COMPARISON:  None. MEDICATIONS: 10 cc 1% lidocaine COMPLICATIONS: None immediate. TECHNIQUE: Informed written consent was obtained from the patient after a discussion of the risks, benefits and alternatives to treatment. A timeout  was performed prior to the initiation of the procedure. Initial ultrasound scanning demonstrates a left pleural effusion. The lower chest was prepped and draped in the usual sterile fashion. 1% lidocaine was used for local anesthesia. Under direct ultrasound guidance, a 19 gauge, 7-cm, Yueh catheter was introduced. An ultrasound image was saved for documentation purposes. The thoracentesis was performed. The catheter was removed and a dressing was applied. The patient tolerated the procedure well without immediate post procedural complication. The patient was escorted to have an upright chest radiograph. FINDINGS: A total of approximately 950 cc of dark yellow fluid was removed. Requested samples were sent to the laboratory. IMPRESSION: Successful ultrasound-guided left sided thoracentesis yielding 950 cc of pleural fluid. Read by:  Lavonia Drafts [facility] Electronically Signed   By: Lucrezia Europe M.D.   On: 03/07/2016 13:44    Labs:  CBC:  Recent Labs  03/03/16 0946  WBC 7.1  HGB 13.5  HCT 41.4  PLT 307    COAGS: No results for input(s): INR, APTT in the last 8760 hours.  BMP:  Recent Labs  03/03/16 0946  NA 139  K 4.1  CO2 28  GLUCOSE 120  BUN 14.2  CALCIUM 9.1  CREATININE 1.1    LIVER FUNCTION TESTS:  Recent Labs  03/03/16 0946  BILITOT 0.37  AST 15  ALT 9  ALKPHOS 79  PROT 7.2  ALBUMIN 3.4*    Assessment and Plan:  Pleural effison s/p thoracentesis yesterday.  Incomplete re-expansion on CXR yesterday  F/U CXR with slightly larger hydropneumothorax.    Patient is completely asymptomatic.    Agrees to return on  Monday for repeat CXR.    Understands if her condition should worsen over the weekend she is to go immediately to an Emergency Room.  She and her daughter express understanding.  Electronically Signed: Murrell Redden PA-C 03/08/2016, 12:00 PM   I spent a total of 15 Minutes at the the patient's bedside AND on the patient's hospital floor or unit, greater than 50% of which was counseling/coordinating care for f/u after thoracentesis.

## 2016-03-10 ENCOUNTER — Ambulatory Visit (HOSPITAL_COMMUNITY)
Admission: RE | Admit: 2016-03-10 | Discharge: 2016-03-10 | Disposition: A | Discharge status: Home / Self Care | Payer: Medicare Other | Source: Ambulatory Visit | Attending: Physician Assistant | Admitting: Physician Assistant

## 2016-03-10 DIAGNOSIS — J95811 Postprocedural pneumothorax: Secondary | ICD-10-CM | POA: Diagnosis not present

## 2016-03-10 DIAGNOSIS — J948 Other specified pleural conditions: Secondary | ICD-10-CM | POA: Insufficient documentation

## 2016-03-10 DIAGNOSIS — R918 Other nonspecific abnormal finding of lung field: Secondary | ICD-10-CM | POA: Diagnosis not present

## 2016-03-10 NOTE — Progress Notes (Signed)
Patient ID: Leslie Carr, female   DOB: 12-30-1930, 82 y.o.   MRN: [id number]   Left thora 3/17 [id number]: Left pleural effusion is significantly smaller status post left thoracentesis. Incomplete re-expansion of the left lung is noted most likely due to chronic cortical scarring. Right middle lobe mass is noted as described on prior CT scan.  CXR 3/18 am:  IMPRESSION: 1. Slight increase in left hydropneumothorax since previous day's exam. Patient will be examined and evaluated.  Pt asymptomatic agreed to return 3/20 am for cxr   3/20 CXR: IMPRESSION: No change in the size of the left pneumothorax. Increased left effusion. More conspicuous mass in the right middle lobe.   Remains asymptomatic breathing better daily No complaints Discussed with Dr Vernard Gambles  Pt given reassurance Await results of labs on thora Pt should hear from Dr Marin Olp office soon  Leaves here today with good understanding To go to ED if chest pain or SOB

## 2016-03-12 ENCOUNTER — Other Ambulatory Visit: Payer: Self-pay | Admitting: Hematology & Oncology

## 2016-03-12 ENCOUNTER — Telehealth: Payer: Self-pay | Admitting: *Deleted

## 2016-03-12 DIAGNOSIS — R918 Other nonspecific abnormal finding of lung field: Secondary | ICD-10-CM

## 2016-03-12 NOTE — Telephone Encounter (Addendum)
Patient's daughter aware of results  ----- Message from Volanda Napoleon, MD sent at 03/12/2016  7:09 AM EDT ----- Call her dgtr - No cancer seen in the fluid taken off around the lung!!  Leslie Carr

## 2016-03-14 ENCOUNTER — Ambulatory Visit (HOSPITAL_BASED_OUTPATIENT_CLINIC_OR_DEPARTMENT_OTHER)
Admission: RE | Admit: 2016-03-14 | Discharge: 2016-03-14 | Disposition: A | Discharge status: Home / Self Care | Payer: Medicare Other | Source: Ambulatory Visit | Attending: Hematology & Oncology | Admitting: Hematology & Oncology

## 2016-03-14 ENCOUNTER — Other Ambulatory Visit: Payer: Self-pay | Admitting: Hematology & Oncology

## 2016-03-14 DIAGNOSIS — Z1231 Encounter for screening mammogram for malignant neoplasm of breast: Secondary | ICD-10-CM | POA: Insufficient documentation

## 2016-03-14 DIAGNOSIS — T386X5A Adverse effect of antigonadotrophins, antiestrogens, antiandrogens, not elsewhere classified, initial encounter: Secondary | ICD-10-CM

## 2016-03-14 DIAGNOSIS — T451X5A Adverse effect of antineoplastic and immunosuppressive drugs, initial encounter: Secondary | ICD-10-CM | POA: Insufficient documentation

## 2016-03-14 DIAGNOSIS — C50912 Malignant neoplasm of unspecified site of left female breast: Secondary | ICD-10-CM | POA: Diagnosis not present

## 2016-03-14 DIAGNOSIS — Z9012 Acquired absence of left breast and nipple: Secondary | ICD-10-CM | POA: Insufficient documentation

## 2016-03-14 DIAGNOSIS — C50919 Malignant neoplasm of unspecified site of unspecified female breast: Secondary | ICD-10-CM

## 2016-03-14 DIAGNOSIS — R899 Unspecified abnormal finding in specimens from other organs, systems and tissues: Secondary | ICD-10-CM

## 2016-03-14 DIAGNOSIS — M818 Other osteoporosis without current pathological fracture: Secondary | ICD-10-CM

## 2016-03-14 DIAGNOSIS — I427 Cardiomyopathy due to drug and external agent: Secondary | ICD-10-CM | POA: Diagnosis not present

## 2016-03-14 DIAGNOSIS — Z90710 Acquired absence of both cervix and uterus: Secondary | ICD-10-CM | POA: Diagnosis not present

## 2016-03-14 DIAGNOSIS — Z780 Asymptomatic menopausal state: Secondary | ICD-10-CM | POA: Diagnosis not present

## 2016-03-14 DIAGNOSIS — Z87891 Personal history of nicotine dependence: Secondary | ICD-10-CM | POA: Insufficient documentation

## 2016-03-14 DIAGNOSIS — M810 Age-related osteoporosis without current pathological fracture: Secondary | ICD-10-CM | POA: Diagnosis not present

## 2016-03-20 ENCOUNTER — Ambulatory Visit: Payer: Medicare Other

## 2016-03-26 ENCOUNTER — Ambulatory Visit (HOSPITAL_COMMUNITY)
Admission: RE | Admit: 2016-03-26 | Discharge: 2016-03-26 | Disposition: A | Discharge status: Home / Self Care | Payer: Medicare Other | Source: Ambulatory Visit | Attending: Hematology & Oncology | Admitting: Hematology & Oncology

## 2016-03-26 DIAGNOSIS — C50919 Malignant neoplasm of unspecified site of unspecified female breast: Secondary | ICD-10-CM | POA: Insufficient documentation

## 2016-03-26 DIAGNOSIS — C799 Secondary malignant neoplasm of unspecified site: Secondary | ICD-10-CM | POA: Diagnosis present

## 2016-03-26 DIAGNOSIS — J9811 Atelectasis: Secondary | ICD-10-CM | POA: Diagnosis not present

## 2016-03-26 DIAGNOSIS — J90 Pleural effusion, not elsewhere classified: Secondary | ICD-10-CM | POA: Insufficient documentation

## 2016-03-26 DIAGNOSIS — I700 Atherosclerosis of aorta: Secondary | ICD-10-CM | POA: Insufficient documentation

## 2016-03-26 DIAGNOSIS — I719 Aortic aneurysm of unspecified site, without rupture: Secondary | ICD-10-CM | POA: Diagnosis not present

## 2016-03-26 LAB — GLUCOSE, CAPILLARY: GLUCOSE-CAPILLARY: 91 mg/dL (ref 65–99)

## 2016-03-26 MED ORDER — FLUDEOXYGLUCOSE F - 18 (FDG) INJECTION
6.5100 | Freq: Once | INTRAVENOUS | Status: AC | PRN
  Administered 2016-03-26: 6.51 via INTRAVENOUS

## 2016-03-27 ENCOUNTER — Telehealth: Payer: Self-pay | Admitting: Hematology & Oncology

## 2016-03-27 NOTE — Telephone Encounter (Signed)
I left a message on answering machine for Ms. Hohenstein daughter, Santiago Glad, and told her what was going on with the PET scan. I think that the next episode had to be a VATS procedure to look in the chest wall and the pleura of the left lung. The fluid keeps coming back. There is activity on the PET scan that might suggest malignancy.  I think a VATS thoracoscopic procedure is the best way breast to get biopsies and drain the fluid again.  I have to believe that there is some form of malignancy there. We discussed have not found it yet. Again with pleural biopsies, this will be the best way for [place] notes going on. Plus, the surgeon can place some Talc powder to sclerose the pleural lining.  I told her that she can call us back if she has any questions.  I will have to talk to her mom about the results.  Laurey Arrow

## 2016-04-09 ENCOUNTER — Encounter: Payer: Self-pay | Admitting: Thoracic Surgery (Cardiothoracic Vascular Surgery)

## 2016-04-09 ENCOUNTER — Other Ambulatory Visit: Payer: Self-pay | Admitting: *Deleted

## 2016-04-09 ENCOUNTER — Institutional Professional Consult (permissible substitution) (INDEPENDENT_AMBULATORY_CARE_PROVIDER_SITE_OTHER): Payer: Medicare Other | Admitting: Thoracic Surgery (Cardiothoracic Vascular Surgery)

## 2016-04-09 VITALS — BP 179/90 | HR 90 | Resp 20 | Ht 63.0 in | Wt 134.0 lb

## 2016-04-09 DIAGNOSIS — I639 Cerebral infarction, unspecified: Secondary | ICD-10-CM | POA: Diagnosis not present

## 2016-04-09 DIAGNOSIS — J90 Pleural effusion, not elsewhere classified: Secondary | ICD-10-CM | POA: Diagnosis not present

## 2016-04-09 DIAGNOSIS — Z853 Personal history of malignant neoplasm of breast: Secondary | ICD-10-CM

## 2016-04-09 DIAGNOSIS — J9811 Atelectasis: Secondary | ICD-10-CM | POA: Diagnosis not present

## 2016-04-09 DIAGNOSIS — R011 Cardiac murmur, unspecified: Secondary | ICD-10-CM

## 2016-04-09 DIAGNOSIS — R911 Solitary pulmonary nodule: Secondary | ICD-10-CM | POA: Diagnosis not present

## 2016-04-09 DIAGNOSIS — Z8673 Personal history of transient ischemic attack (TIA), and cerebral infarction without residual deficits: Secondary | ICD-10-CM | POA: Insufficient documentation

## 2016-04-09 DIAGNOSIS — E785 Hyperlipidemia, unspecified: Secondary | ICD-10-CM

## 2016-04-09 NOTE — Progress Notes (Signed)
PCP is Corine Shelter, PA-C Referring Provider is Ennever, Rudell Cobb, MD  Chief Complaint  Patient presents with  . Pleural Effusion    Surgical eval on recurrent Pleural Effusion, Pet Scan 03/26/16, Chest CT 03/04/16    HPI: [name] is an 82 year old woman who was sent for consultation regarding a left pleural effusion.  [name] is an 82 year old woman with a past medical history significant for hypercholesterolemia, breast cancer treated in 1985, and a stroke affecting her left arm in 2014. She lived in [place] until recently. In August 2016 she was found to have a left pleural effusion. She is not sure why imaging was done and thinks it may have just been a routine checkup. She did not have any complaints of shortness of breath. A thoracentesis was performed and drained 700 mL of fluid. Cytology was negative for malignancy but reported "abnormal T cells". Flow cytometry was negative. Because of this result her family physician felt that she needed to see an oncologist when she came to [place].  She recently saw Dr. Burney Gauze.  He recommended a repeat CT scan and it showed a large left pleural effusion. There also was a right middle lobe nodule. An ultrasound-guided thoracentesis was performed. There was incomplete reexpansion of the left lower lobe on the postthoracentesis film. She was already showing signs of fluid reaccumulation the following day. Cytology was negative for malignancy. A PET CT was done which showed a left pleural effusion with associated atelectasis. There was hypermetabolic enhancing rim along the pleura. There was mild hypermetabolic activity associated with the right middle lobe nodule which radiology felt was most likely infectious or inflammatory.  She has been feeling well. She has some residual left arm and hand weakness due to her stroke in 2014. She has not had any recent changes in her neurologic status. She also has paresthesias in the left arm. She  denies any chest pressure or tightness with exertion. She does not have any orthopnea or paroxysmal nocturnal dyspnea. She does get short of breath when she walks up a full flight of stairs, but can do so without stopping. She does have some mild left sided pain under the left breast. She has had a nonproductive cough. She is not aware of any hemoptysis or wheezing. Her appetite is good. She has lost 3 pounds in the past 3 months.  Zubrod Score: At the time of surgery this patient's most appropriate activity status/level should be described as: [x]     0    Normal activity, no symptoms []     1    Restricted in physical strenuous activity but ambulatory, able to do out light work []     2    Ambulatory and capable of self care, unable to do work activities, up and about >50 % of waking hours                              []     3    Only limited self care, in bed greater than 50% of waking hours []     4    Completely disabled, no self care, confined to bed or chair []     5    Moribund   Past Medical History  Diagnosis Date  . Breast cancer ([facility])   . CHF (congestive heart failure) ([facility])   Hyperlipidemia Left pleural effusion  Past Surgical History  Hysterectomy 1976 Left mastectomy 1985   Family  history  Father and brother with heart disease Mother had cancer  Social History Social History  Substance Use Topics  . Smoking status: Former Research scientist (life sciences)  . Smokeless tobacco: None     Comment: Quit over 40 years ago  . Alcohol Use: None  Smoked in the 1960s, quit 50 years ago  Current Outpatient Prescriptions  Medication Sig Dispense Refill  . aspirin 81 MG tablet Take 81 mg by mouth daily.    . pravastatin (PRAVACHOL) 20 MG tablet Take 20 mg by mouth daily.     No current facility-administered medications for this visit.    Allergies  Allergen Reactions  . Lipitor [Atorvastatin] Other (See Comments)    Dizzy and make her head feel huge     Review of Systems  Constitutional:  Negative for fever, chills, activity change, appetite change and unexpected weight change.  HENT: Negative for trouble swallowing and voice change.   Eyes: Negative for visual disturbance.  Respiratory: Positive for cough (Nonproductive), shortness of breath (With heavy exertion) and wheezing.        No orthopnea or paroxysmal nocturnal dyspnea, mild pleuritic pain under her left breast  Cardiovascular: Negative for chest pain, palpitations and leg swelling.       No orthopnea or paroxysmal nocturnal dyspnea  Gastrointestinal: Negative for abdominal pain and blood in stool.  Genitourinary: Negative for hematuria and difficulty urinating.  Musculoskeletal: Negative for arthralgias and gait problem.  Neurological: Positive for weakness (LUE). Negative for syncope.       Paresthesias left arm  Hematological: Negative for adenopathy. Does not bruise/bleed easily.  All other systems reviewed and are negative.   BP 179/90 mmHg  Pulse 90  Resp 20  Ht 5\' 3"  (1.6 m)  Wt 134 lb (60.782 kg)  BMI 23.74 kg/m2  SpO2 91% Physical Exam  Constitutional: She is oriented to person, place, and time. She appears well-developed and well-nourished. No distress.  HENT:  Head: Normocephalic and atraumatic.  Eyes: Conjunctivae and EOM are normal. No scleral icterus.  Neck: Neck supple. No thyromegaly present.  Cardiovascular: Normal rate and regular rhythm.  Exam reveals no gallop and no friction rub.   Murmur (2/6 crescendo decrescendo right upper sternal border) heard. Pulmonary/Chest: Effort normal. No respiratory distress. She has no wheezes. She has no rales.  Absent breath sounds left base, diminished throughout the left side. Status post mastectomy with prosthesis on left  Abdominal: Soft. She exhibits no distension. There is no tenderness.  Musculoskeletal: She exhibits no edema.  Lymphadenopathy:    She has no cervical adenopathy.  Neurological: She is alert and oriented to person, place, and  time. No cranial nerve deficit. She exhibits abnormal muscle tone (Left arm and hand).  Diminished grip strength left hand  Skin: Skin is warm and dry.  Vitals reviewed.    Diagnostic Tests: CT CHEST, ABDOMEN, AND PELVIS WITH CONTRAST 03/04/2016  TECHNIQUE: Multidetector CT imaging of the chest, abdomen and pelvis was performed following the standard protocol during bolus administration of intravenous contrast.  CONTRAST: 145mL OMNIPAQUE IOHEXOL 300 MG/ML SOLN  COMPARISON: CT the abdomen and pelvis 11/05/2006.  FINDINGS: CT CHEST FINDINGS  Mediastinum/Lymph Nodes: Heart size is normal. There is no significant pericardial fluid, thickening or pericardial calcification. There is atherosclerosis of the thoracic aorta, the great vessels of the mediastinum and the coronary arteries, including calcified atherosclerotic plaque in the left anterior descending and right coronary arteries. In addition, there is mild aneurysmal dilatation of the ascending thoracic aorta (4.5 cm  in diameter). Extensive atheromatous plaque is noted in the left subclavian artery, where there is potentially hemodynamically significant stenosis as the vessel traverses the thoracic inlet. Thickening calcification of the aortic valve, mitral valve and mitral annulus. Esophagus is unremarkable in appearance. No axillary lymphadenopathy.  Lungs/Pleura: Large left pleural effusion which appears to be partially loculated anteriorly. Additionally, there are areas of pleural thickening and enhancement, suggestive of a malignant pleural effusion. There is near complete atelectasis of the left upper lobe. Partial subsegmental atelectasis of the left lower lobe. New mass-like lesion in the lateral segment of the right middle lobe measuring 1.7 x 3.1 x 1.7 cm (image 37 of series 3 and coronal image 69 of series 4). 3 mm right lower lobe pulmonary nodule (image 47 of series 3) is new compared to the prior  study, but nonspecific.  Musculoskeletal/Soft Tissues: There are no aggressive appearing lytic or blastic lesions noted in the visualized portions of the skeleton.  CT ABDOMEN AND PELVIS FINDINGS  Hepatobiliary: No cystic or solid hepatic lesions. No intra or extrahepatic biliary ductal dilatation. Gallbladder is normal in appearance.  Pancreas: No pancreatic mass. No pancreatic ductal dilatation. No pancreatic or peripancreatic fluid or inflammatory changes.  Spleen: Unremarkable.  Adrenals/Urinary Tract: Bilateral adrenal glands and bilateral kidneys are normal in appearance. No hydroureteronephrosis. Urinary bladder is normal in appearance.  Stomach/Bowel: Stomach is normal in appearance. No pathologic dilatation of small bowel or colon.  Vascular/Lymphatic: Atherosclerosis throughout the abdominal and pelvic vasculature, including focal saccular aneurysmal dilatation of the immediate infrarenal abdominal aorta which measures up to 3.5 x 3.2 cm. No lymphadenopathy noted in the abdomen or pelvis.  Reproductive: Status post hysterectomy. Ovaries are not confidently identified may be surgically absent or atrophic.  Other: No significant volume of ascites. No pneumoperitoneum.  Musculoskeletal: There are no aggressive appearing lytic or blastic lesions noted in the visualized portions of the skeleton.  IMPRESSION: 1. Large left pleural effusion with extensive pleural thickening and enhancement, concerning for a malignant pleural effusion. Correlation with thoracentesis is suggested. This is associated with extensive passive atelectasis in the left lung, as discussed above. 2. New mass-like area in the lateral segment of the right middle lobe measuring 1.7 x 3.1 x 1.7 cm. Differential considerations include a primary bronchogenic neoplasm, focus of infection/inflammation, or a new pulmonary metastasis. Correlation with PET-CT and/or biopsy should be considered  if clinically appropriate. 3. Atherosclerosis, including 2 vessel coronary artery disease. 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 Recommend followup by ultrasound in 3 years. This recommendation follows ACR consensus guidelines: White Paper of the ACR Incidental Findings Committee II on Vascular Findings. J Am Coll Radiol 2013; 10:789-794. 4. Additional incidental findings, as above.   Electronically Signed  By: Vinnie Langton M.D.  On: 03/04/2016 14:31  NUCLEAR MEDICINE PET SKULL BASE TO THIGH 03/26/2016  TECHNIQUE: 6.5 mCi F-18 FDG was injected intravenously. Full-ring PET imaging was performed from the skull base to thigh after the radiotracer. CT data was obtained and used for attenuation correction and anatomic localization.  FASTING BLOOD GLUCOSE: Value: 91 mg/dl  COMPARISON: CT scans 03/04/2016  FINDINGS: NECK  No hypermetabolic lymph nodes in the neck.  CHEST  Surgical changes from a left mastectomy. No findings for chest wall tumor or axillary adenopathy.  Persistent large loculated left pleural fluid collection with compressive atelectasis. There is a rim of hypermetabolism around pleural  fluid which could be inflammatory, infectious or neoplastic. I do not see any obvious nodularity. The SUV max is 2.9.  The right middle lobe lesion identified on the prior chest CT is weakly hypermetabolic with SUV max of 2.5. This could be an inflammatory or infectious process. Recommend continued surveillance.  Stable advanced atherosclerotic calcifications involving the thoracic aorta and branch vessels including the coronary arteries. The ascending aorta measures 4.4 cm.  ABDOMEN/PELVIS  No abnormal hypermetabolic activity within the liver, pancreas, adrenal glands, or spleen. No hypermetabolic lymph nodes in the abdomen  or pelvis.  Advanced atherosclerotic calcifications involving the aorta and branch vessels. Stable 3 cm infrarenal aortic aneurysm.  SKELETON  No findings to suggest osseous metastatic disease.  Severe degenerative changes involving the left shoulder. The the  IMPRESSION: 1. Persistent large loculated left pleural fluid collection and compressive lung atelectasis. Rim of hypermetabolism involving the pleural could be inflammatory, infectious or possibly neoplastic. No obvious nodularity and the SUV max is 2.9. 2. Stable right middle lobe lesion weakly hypermetabolic. This is most likely inflammatory or infectious. Recommend continued surveillance. 3. Severe atherosclerotic disease involving the thoracic and abdominal aorta and branch vessels.   Electronically Signed  By: Marijo Sanes M.D.  On: 03/26/2016 13:12   Echocardiogram 03/07/2016  Study Conclusions  - Procedure narrative: Transthoracic echocardiography. Technically  difficult study. - Left ventricle: The cavity size was normal. There was moderate  concentric hypertrophy. Systolic function was normal. The  estimated ejection fraction was 48%. Diffuse hypokinesis. Doppler  parameters are consistent with abnormal left ventricular  relaxation (grade 1 diastolic dysfunction). The E/e&' ratio is  >15, suggesting elevated LV filling pressure. - Aortic valve: Poorly visualized. Transvalvular velocity was  minimally increased. - Left atrium: The atrium was normal in size. - Pericardium, extracardiac: There was a right pleural effusion.  There was a left pleural effusion.  Impressions:  - Technically difficult study. LVEF 48%, diffuse hypokinesis,  moderate LVH, disatolic dysfunction, elevated LV filling  pressure, bilateral pleural effusions, normal LA size, mildly  increased aortic valve velocity.  I personally reviewed the CT and PET CT and I concur with the findings as noted  above. Impression: 82 year old woman with a recurrent left pleural effusion and a right lower lobe lung nodule. She has had thoracentesis on 2 occasions, the first in [place] in August and more recently in March here in [place]. Cytology was negative on both occasions. Of note after her recent thoracentesis she had incomplete reexpansion of the left lower lobe. PET/CT shows hypermetabolic enhancement of the parietal pleura, which is suspicious for possible malignancy, but could also be an indicator for chronic inflammatory state. She needs a pleural biopsy for diagnostic purposes.  I recommended to [name] and her daughter accompanied her that we proceed with left VATS, drainage of pleural effusion, pleural biopsy, possible decortication and possible pleural catheter placement. I doubt that a decortication will be possible, but will need to evaluate that intraoperatively before making a final determination. They understand that the decision to do a decortication and/or place a pleural catheter will depend on intraoperative findings. I explained the general nature of the procedure to them including the need for general anesthesia, the incisions to be used, the use of drainage tubes postoperatively, the intraoperative decision making, the expected hospital stay, and the overall recovery. I reviewed the indications, risks, benefits, and alternatives. They understand the risks include, but are not limited to death, MI, DVT, PE, bleeding, possible need for transfusion, infection, inability to  reexpand the lung, respiratory failure, as well as the possibility for other unforeseeable complications.  She accepts the risk and wishes to proceed with surgery  She does have a rather prominent heart murmur. Her recent echo showed possible mild aortic valve gradient, but no suggestion of significant aortic stenosis. She is not having any anginal-type symptoms.  She does have a history of stroke but has  minimal residual deficit.  Workup of the right middle lobe nodule be deferred while dealing with the left pleural effusion.  Plan: Left VATS, drainage of pleural effusion, pleural biopsy, possible decortication, possible pleural catheter placement on Thursday, 04/17/2016  Melrose Nakayama, MD [facility] [phone]

## 2016-04-15 ENCOUNTER — Other Ambulatory Visit: Payer: Self-pay

## 2016-04-15 ENCOUNTER — Encounter (HOSPITAL_COMMUNITY)
Admission: RE | Admit: 2016-04-15 | Discharge: 2016-04-15 | Disposition: A | Discharge status: Home / Self Care | Payer: Medicare Other | Source: Ambulatory Visit | Attending: Thoracic Surgery (Cardiothoracic Vascular Surgery) | Admitting: Thoracic Surgery (Cardiothoracic Vascular Surgery)

## 2016-04-15 ENCOUNTER — Ambulatory Visit (HOSPITAL_COMMUNITY)
Admission: RE | Admit: 2016-04-15 | Discharge: 2016-04-15 | Disposition: A | Discharge status: Home / Self Care | Payer: Medicare Other | Source: Ambulatory Visit | Attending: Thoracic Surgery (Cardiothoracic Vascular Surgery) | Admitting: Thoracic Surgery (Cardiothoracic Vascular Surgery)

## 2016-04-15 ENCOUNTER — Encounter (HOSPITAL_COMMUNITY): Payer: Self-pay

## 2016-04-15 VITALS — BP 145/68 | HR 90 | Temp 98.0°F | Resp 20 | Ht 61.0 in | Wt 133.5 lb

## 2016-04-15 DIAGNOSIS — D62 Acute posthemorrhagic anemia: Secondary | ICD-10-CM | POA: Diagnosis not present

## 2016-04-15 DIAGNOSIS — Z01818 Encounter for other preprocedural examination: Secondary | ICD-10-CM | POA: Insufficient documentation

## 2016-04-15 DIAGNOSIS — Z01810 Encounter for preprocedural cardiovascular examination: Secondary | ICD-10-CM | POA: Insufficient documentation

## 2016-04-15 DIAGNOSIS — Z7982 Long term (current) use of aspirin: Secondary | ICD-10-CM

## 2016-04-15 DIAGNOSIS — R911 Solitary pulmonary nodule: Secondary | ICD-10-CM

## 2016-04-15 DIAGNOSIS — Z79899 Other long term (current) drug therapy: Secondary | ICD-10-CM

## 2016-04-15 DIAGNOSIS — J948 Other specified pleural conditions: Secondary | ICD-10-CM

## 2016-04-15 DIAGNOSIS — J90 Pleural effusion, not elsewhere classified: Secondary | ICD-10-CM

## 2016-04-15 DIAGNOSIS — E785 Hyperlipidemia, unspecified: Secondary | ICD-10-CM

## 2016-04-15 DIAGNOSIS — I5032 Chronic diastolic (congestive) heart failure: Secondary | ICD-10-CM | POA: Diagnosis not present

## 2016-04-15 DIAGNOSIS — Z01812 Encounter for preprocedural laboratory examination: Secondary | ICD-10-CM

## 2016-04-15 DIAGNOSIS — I69354 Hemiplegia and hemiparesis following cerebral infarction affecting left non-dominant side: Secondary | ICD-10-CM | POA: Diagnosis not present

## 2016-04-15 HISTORY — DX: Reserved for inherently not codable concepts without codable children: IMO0001

## 2016-04-15 HISTORY — DX: Pleural effusion, not elsewhere classified: J90

## 2016-04-15 HISTORY — DX: Cardiac murmur, unspecified: R01.1

## 2016-04-15 HISTORY — DX: Personal history of other diseases of the respiratory system: Z87.09

## 2016-04-15 HISTORY — DX: Anesthesia of skin: R20.2

## 2016-04-15 HISTORY — DX: Anesthesia of skin: R20.0

## 2016-04-15 HISTORY — DX: Cerebral infarction, unspecified: I63.9

## 2016-04-15 HISTORY — DX: Pure hypercholesterolemia, unspecified: E78.00

## 2016-04-15 LAB — URINALYSIS, ROUTINE W REFLEX MICROSCOPIC
BILIRUBIN URINE: NEGATIVE
Glucose, UA: NEGATIVE mg/dL
Hgb urine dipstick: NEGATIVE
Ketones, ur: NEGATIVE mg/dL
LEUKOCYTES UA: NEGATIVE
NITRITE: NEGATIVE
PH: 6 (ref 5.0–8.0)
Protein, ur: NEGATIVE mg/dL
SPECIFIC GRAVITY, URINE: 1.014 (ref 1.005–1.030)

## 2016-04-15 LAB — CBC
HEMATOCRIT: 40.6 % (ref 36.0–46.0)
HEMOGLOBIN: 12.9 g/dL (ref 12.0–15.0)
MCH: 27.9 pg (ref 26.0–34.0)
MCHC: 31.8 g/dL (ref 30.0–36.0)
MCV: 87.9 fL (ref 78.0–100.0)
Platelets: 326 10*3/uL (ref 150–400)
RBC: 4.62 MIL/uL (ref 3.87–5.11)
RDW: 14 % (ref 11.5–15.5)
WBC: 7 10*3/uL (ref 4.0–10.5)

## 2016-04-15 LAB — COMPREHENSIVE METABOLIC PANEL
ALK PHOS: 71 U/L (ref 38–126)
ALT: 12 U/L — ABNORMAL LOW (ref 14–54)
AST: 21 U/L (ref 15–41)
Albumin: 3.4 g/dL — ABNORMAL LOW (ref 3.5–5.0)
Anion gap: 9 (ref 5–15)
BILIRUBIN TOTAL: 0.3 mg/dL (ref 0.3–1.2)
BUN: 14 mg/dL (ref 6–20)
CALCIUM: 8.9 mg/dL (ref 8.9–10.3)
CO2: 27 mmol/L (ref 22–32)
Chloride: 102 mmol/L (ref 101–111)
Creatinine, Ser: 1.13 mg/dL — ABNORMAL HIGH (ref 0.44–1.00)
GFR calc Af Amer: 51 mL/min — ABNORMAL LOW (ref >60)
GFR, EST NON AFRICAN AMERICAN: 44 mL/min — AB (ref >60)
GLUCOSE: 113 mg/dL — AB (ref 65–99)
POTASSIUM: 4.4 mmol/L (ref 3.5–5.1)
Sodium: 138 mmol/L (ref 135–145)
TOTAL PROTEIN: 6.6 g/dL (ref 6.5–8.1)

## 2016-04-15 LAB — BLOOD GAS, ARTERIAL
Acid-Base Excess: 2.2 mmol/L — ABNORMAL HIGH (ref 0.0–2.0)
BICARBONATE: 25.3 meq/L — AB (ref 20.0–24.0)
Drawn by: 206361
FIO2: 0.21
O2 Saturation: 95.7 %
PCO2 ART: 33.3 mmHg — AB (ref 35.0–45.0)
PH ART: 7.494 — AB (ref 7.350–7.450)
Patient temperature: 98.6
TCO2: 26.3 mmol/L (ref 0–100)
pO2, Arterial: 76.7 mmHg — ABNORMAL LOW (ref 80.0–100.0)

## 2016-04-15 LAB — SURGICAL PCR SCREEN
MRSA, PCR: NEGATIVE
STAPHYLOCOCCUS AUREUS: NEGATIVE

## 2016-04-15 LAB — PROTIME-INR
INR: 1.02 (ref 0.00–1.49)
PROTHROMBIN TIME: 13.6 s (ref 11.6–15.2)

## 2016-04-15 LAB — APTT: aPTT: 25 seconds (ref 24–37)

## 2016-04-15 NOTE — Progress Notes (Signed)
PCP - Dr. Brandt Loosen Cardiologist - denies  EKG - 04/15/16 CXR - 04/15/16  Echo - 2017 Stress test/Cardiac Cath - denies  Patient denies chest pain and shortness of breath at PAT appointment.

## 2016-04-15 NOTE — Pre-Procedure Instructions (Signed)
    Leslie Carr  04/15/2016      [id number] - [place], [place] - [address] [place] [address] Phone: [phone] Fax: [phone]    Your procedure is scheduled on Thursday, April 27th, 2017.  Report to [facility] Admitting at 5:30 A.M.   Call this number if you have problems the morning of surgery:  [phone]   Remember:   Do not eat food or drink liquids after midnight.   Take these medicines the morning of surgery with A SIP OF WATER: None.   Stop taking: Aspirin, NSAIDS, Aleve, Naproxen, Ibuprofen, Advil, Motrin, BC's, Goody's, Fish oil, all herbal medications, and all vitamins.    Do not wear jewelry, make-up or nail polish.  Do not wear lotions, powders, or perfumes.  You may NOT wear deodorant.  Do not shave 48 hours prior to surgery.    Do not bring valuables to the hospital.   [facility] is not responsible for any belongings or valuables.  Contacts, dentures or bridgework may not be worn into surgery.  Leave your suitcase in the car.  After surgery it may be brought to your room.  For patients admitted to the hospital, discharge time will be determined by your treatment team.  Patients discharged the day of surgery will not be allowed to drive home.   Special instructions:  See attached.   Please read over the following fact sheets that you were given. Pain Booklet, Coughing and Deep Breathing, Blood Transfusion Information, MRSA Information and Surgical Site Infection Prevention

## 2016-04-16 ENCOUNTER — Other Ambulatory Visit: Payer: Self-pay | Admitting: *Deleted

## 2016-04-16 DIAGNOSIS — J90 Pleural effusion, not elsewhere classified: Secondary | ICD-10-CM

## 2016-04-16 MED ORDER — DEXTROSE 5 % IV SOLN
1.5000 g | INTRAVENOUS | Status: AC
  Administered 2016-04-17: 1.5 g via INTRAVENOUS
  Filled 2016-04-16: qty 1.5

## 2016-04-17 ENCOUNTER — Inpatient Hospital Stay (HOSPITAL_COMMUNITY): Payer: Medicare Other | Admitting: Anesthesiology

## 2016-04-17 ENCOUNTER — Encounter (HOSPITAL_COMMUNITY)
Admission: RE | Disposition: A | Discharge status: Home / Self Care | Payer: Self-pay | Source: Ambulatory Visit | Attending: Thoracic Surgery (Cardiothoracic Vascular Surgery)

## 2016-04-17 ENCOUNTER — Encounter (HOSPITAL_COMMUNITY): Payer: Self-pay | Admitting: General Practice

## 2016-04-17 ENCOUNTER — Inpatient Hospital Stay (HOSPITAL_COMMUNITY)
Admission: RE | Admit: 2016-04-17 | Discharge: 2016-04-25 | DRG: 164 | Disposition: A | Discharge status: Home / Self Care | Payer: Medicare Other | Source: Ambulatory Visit | Attending: Thoracic Surgery (Cardiothoracic Vascular Surgery) | Admitting: Thoracic Surgery (Cardiothoracic Vascular Surgery)

## 2016-04-17 ENCOUNTER — Inpatient Hospital Stay (HOSPITAL_COMMUNITY): Payer: Medicare Other

## 2016-04-17 DIAGNOSIS — Z09 Encounter for follow-up examination after completed treatment for conditions other than malignant neoplasm: Secondary | ICD-10-CM

## 2016-04-17 DIAGNOSIS — Z9012 Acquired absence of left breast and nipple: Secondary | ICD-10-CM

## 2016-04-17 DIAGNOSIS — J939 Pneumothorax, unspecified: Secondary | ICD-10-CM

## 2016-04-17 DIAGNOSIS — J948 Other specified pleural conditions: Secondary | ICD-10-CM | POA: Diagnosis present

## 2016-04-17 DIAGNOSIS — J9811 Atelectasis: Secondary | ICD-10-CM | POA: Diagnosis present

## 2016-04-17 DIAGNOSIS — Z809 Family history of malignant neoplasm, unspecified: Secondary | ICD-10-CM

## 2016-04-17 DIAGNOSIS — Z8249 Family history of ischemic heart disease and other diseases of the circulatory system: Secondary | ICD-10-CM | POA: Diagnosis not present

## 2016-04-17 DIAGNOSIS — I5032 Chronic diastolic (congestive) heart failure: Secondary | ICD-10-CM | POA: Diagnosis not present

## 2016-04-17 DIAGNOSIS — S2239XA Fracture of one rib, unspecified side, initial encounter for closed fracture: Secondary | ICD-10-CM | POA: Diagnosis not present

## 2016-04-17 DIAGNOSIS — Z853 Personal history of malignant neoplasm of breast: Secondary | ICD-10-CM

## 2016-04-17 DIAGNOSIS — E785 Hyperlipidemia, unspecified: Secondary | ICD-10-CM | POA: Diagnosis not present

## 2016-04-17 DIAGNOSIS — R911 Solitary pulmonary nodule: Secondary | ICD-10-CM | POA: Diagnosis present

## 2016-04-17 DIAGNOSIS — I4891 Unspecified atrial fibrillation: Secondary | ICD-10-CM | POA: Diagnosis not present

## 2016-04-17 DIAGNOSIS — Z8673 Personal history of transient ischemic attack (TIA), and cerebral infarction without residual deficits: Secondary | ICD-10-CM | POA: Diagnosis present

## 2016-04-17 DIAGNOSIS — I359 Nonrheumatic aortic valve disorder, unspecified: Secondary | ICD-10-CM

## 2016-04-17 DIAGNOSIS — Z23 Encounter for immunization: Secondary | ICD-10-CM | POA: Diagnosis not present

## 2016-04-17 DIAGNOSIS — Z90710 Acquired absence of both cervix and uterus: Secondary | ICD-10-CM | POA: Diagnosis not present

## 2016-04-17 DIAGNOSIS — I69354 Hemiplegia and hemiparesis following cerebral infarction affecting left non-dominant side: Secondary | ICD-10-CM

## 2016-04-17 DIAGNOSIS — Z888 Allergy status to other drugs, medicaments and biological substances status: Secondary | ICD-10-CM | POA: Diagnosis not present

## 2016-04-17 DIAGNOSIS — E7800 Pure hypercholesterolemia, unspecified: Secondary | ICD-10-CM | POA: Diagnosis present

## 2016-04-17 DIAGNOSIS — I63429 Cerebral infarction due to embolism of unspecified anterior cerebral artery: Secondary | ICD-10-CM | POA: Diagnosis not present

## 2016-04-17 DIAGNOSIS — I9789 Other postprocedural complications and disorders of the circulatory system, not elsewhere classified: Secondary | ICD-10-CM | POA: Diagnosis not present

## 2016-04-17 DIAGNOSIS — J60 Coalworker's pneumoconiosis: Secondary | ICD-10-CM | POA: Diagnosis not present

## 2016-04-17 DIAGNOSIS — I639 Cerebral infarction, unspecified: Secondary | ICD-10-CM

## 2016-04-17 DIAGNOSIS — Z87891 Personal history of nicotine dependence: Secondary | ICD-10-CM | POA: Diagnosis not present

## 2016-04-17 DIAGNOSIS — Z9010 Acquired absence of unspecified breast and nipple: Secondary | ICD-10-CM

## 2016-04-17 DIAGNOSIS — I480 Paroxysmal atrial fibrillation: Secondary | ICD-10-CM | POA: Diagnosis not present

## 2016-04-17 DIAGNOSIS — J9382 Other air leak: Secondary | ICD-10-CM | POA: Diagnosis not present

## 2016-04-17 DIAGNOSIS — I2510 Atherosclerotic heart disease of native coronary artery without angina pectoris: Secondary | ICD-10-CM | POA: Diagnosis present

## 2016-04-17 DIAGNOSIS — Z7982 Long term (current) use of aspirin: Secondary | ICD-10-CM

## 2016-04-17 DIAGNOSIS — Z4682 Encounter for fitting and adjustment of non-vascular catheter: Secondary | ICD-10-CM | POA: Diagnosis not present

## 2016-04-17 DIAGNOSIS — D62 Acute posthemorrhagic anemia: Secondary | ICD-10-CM | POA: Diagnosis not present

## 2016-04-17 DIAGNOSIS — J90 Pleural effusion, not elsewhere classified: Principal | ICD-10-CM | POA: Diagnosis present

## 2016-04-17 DIAGNOSIS — R091 Pleurisy: Secondary | ICD-10-CM | POA: Diagnosis not present

## 2016-04-17 DIAGNOSIS — I350 Nonrheumatic aortic (valve) stenosis: Secondary | ICD-10-CM

## 2016-04-17 DIAGNOSIS — Z79899 Other long term (current) drug therapy: Secondary | ICD-10-CM

## 2016-04-17 DIAGNOSIS — R918 Other nonspecific abnormal finding of lung field: Secondary | ICD-10-CM | POA: Diagnosis not present

## 2016-04-17 DIAGNOSIS — I509 Heart failure, unspecified: Secondary | ICD-10-CM | POA: Diagnosis not present

## 2016-04-17 HISTORY — PX: VIDEO ASSISTED THORACOSCOPY: SHX5073

## 2016-04-17 HISTORY — PX: PLEURAL EFFUSION DRAINAGE: SHX5099

## 2016-04-17 HISTORY — PX: DECORTICATION: SHX5101

## 2016-04-17 HISTORY — PX: PLEURAL BIOPSY: SHX5082

## 2016-04-17 LAB — POCT I-STAT 7, (LYTES, BLD GAS, ICA,H+H)
ACID-BASE DEFICIT: 2 mmol/L (ref 0.0–2.0)
Acid-base deficit: 1 mmol/L (ref 0.0–2.0)
BICARBONATE: 24.5 meq/L — AB (ref 20.0–24.0)
Bicarbonate: 23.9 mEq/L (ref 20.0–24.0)
CALCIUM ION: 1.02 mmol/L — AB (ref 1.13–1.30)
Calcium, Ion: 1.02 mmol/L — ABNORMAL LOW (ref 1.13–1.30)
HCT: 26 % — ABNORMAL LOW (ref 36.0–46.0)
HEMATOCRIT: 27 % — AB (ref 36.0–46.0)
Hemoglobin: 9.2 g/dL — ABNORMAL LOW (ref 12.0–15.0)
Hemoglobin: 8.8 g/dL — ABNORMAL LOW (ref 12.0–15.0)
O2 SAT: 100 %
O2 SAT: 100 %
PCO2 ART: 41.9 mmHg (ref 35.0–45.0)
PH ART: 7.376 (ref 7.350–7.450)
PO2 ART: 484 mmHg — AB (ref 80.0–100.0)
POTASSIUM: 4 mmol/L (ref 3.5–5.1)
POTASSIUM: 4.9 mmol/L (ref 3.5–5.1)
Patient temperature: 35.4
Patient temperature: 35.2
SODIUM: 143 mmol/L (ref 135–145)
SODIUM: 138 mmol/L (ref 135–145)
TCO2: 25 mmol/L (ref 0–100)
TCO2: 26 mmol/L (ref 0–100)
pCO2 arterial: 40 mmHg (ref 35.0–45.0)
pH, Arterial: 7.367 (ref 7.350–7.450)
pO2, Arterial: 316 mmHg — ABNORMAL HIGH (ref 80.0–100.0)

## 2016-04-17 LAB — TYPE AND SCREEN
ABO/RH(D): B POS
ANTIBODY SCREEN: NEGATIVE

## 2016-04-17 LAB — CBC
HEMATOCRIT: 21.8 % — AB (ref 36.0–46.0)
Hemoglobin: 7.1 g/dL — ABNORMAL LOW (ref 12.0–15.0)
MCH: 28.4 pg (ref 26.0–34.0)
MCHC: 32.6 g/dL (ref 30.0–36.0)
MCV: 87.2 fL (ref 78.0–100.0)
PLATELETS: 111 10*3/uL — AB (ref 150–400)
RBC: 2.5 MIL/uL — ABNORMAL LOW (ref 3.87–5.11)
RDW: 14.3 % (ref 11.5–15.5)
WBC: 9.3 10*3/uL (ref 4.0–10.5)

## 2016-04-17 LAB — BASIC METABOLIC PANEL
ANION GAP: 5 (ref 5–15)
BUN: 13 mg/dL (ref 6–20)
CALCIUM: 5.8 mg/dL — AB (ref 8.9–10.3)
CHLORIDE: 118 mmol/L — AB (ref 101–111)
CO2: 18 mmol/L — ABNORMAL LOW (ref 22–32)
Creatinine, Ser: 0.9 mg/dL (ref 0.44–1.00)
GFR calc non Af Amer: 58 mL/min — ABNORMAL LOW (ref >60)
Glucose, Bld: 197 mg/dL — ABNORMAL HIGH (ref 65–99)
Potassium: 3.8 mmol/L (ref 3.5–5.1)
SODIUM: 141 mmol/L (ref 135–145)

## 2016-04-17 LAB — BLOOD GAS, ARTERIAL
ACID-BASE DEFICIT: 2.8 mmol/L — AB (ref 0.0–2.0)
Bicarbonate: 22.6 mEq/L (ref 20.0–24.0)
O2 CONTENT: 15 L/min
O2 SAT: 98.8 %
PATIENT TEMPERATURE: 97.9
PCO2 ART: 45.5 mmHg — AB (ref 35.0–45.0)
PO2 ART: 149 mmHg — AB (ref 80.0–100.0)
TCO2: 24 mmol/L (ref 0–100)
pH, Arterial: 7.314 — ABNORMAL LOW (ref 7.350–7.450)

## 2016-04-17 LAB — GRAM STAIN

## 2016-04-17 LAB — GLUCOSE, CAPILLARY: GLUCOSE-CAPILLARY: 194 mg/dL — AB (ref 65–99)

## 2016-04-17 LAB — MAGNESIUM: MAGNESIUM: 1.3 mg/dL — AB (ref 1.7–2.4)

## 2016-04-17 LAB — PREPARE RBC (CROSSMATCH)

## 2016-04-17 SURGERY — VIDEO ASSISTED THORACOSCOPY
Anesthesia: General | Site: Chest | Laterality: Left

## 2016-04-17 MED ORDER — OXYCODONE HCL 5 MG PO TABS: 5.0000 mg | ORAL_TABLET | Freq: Once | ORAL | Status: DC | PRN

## 2016-04-17 MED ORDER — ONDANSETRON HCL 4 MG/2ML IJ SOLN
4.0000 mg | Freq: Four times a day (QID) | INTRAMUSCULAR | Status: DC | PRN
  Administered 2016-04-19 – 2016-04-24 (×4): 4 mg via INTRAVENOUS
  Filled 2016-04-17 (×5): qty 2

## 2016-04-17 MED ORDER — SUGAMMADEX SODIUM 200 MG/2ML IV SOLN
INTRAVENOUS | Status: DC | PRN
  Administered 2016-04-17: 200 mg via INTRAVENOUS

## 2016-04-17 MED ORDER — DIPHENHYDRAMINE HCL 50 MG/ML IJ SOLN
12.5000 mg | Freq: Four times a day (QID) | INTRAMUSCULAR | Status: DC | PRN
  Filled 2016-04-17: qty 0.25

## 2016-04-17 MED ORDER — BUPIVACAINE ON-Q PAIN PUMP (FOR ORDER SET NO CHG)
INJECTION | Status: AC
  Filled 2016-04-17: qty 1

## 2016-04-17 MED ORDER — SUGAMMADEX SODIUM 200 MG/2ML IV SOLN
INTRAVENOUS | Status: AC
  Filled 2016-04-17: qty 2

## 2016-04-17 MED ORDER — ACETAMINOPHEN 160 MG/5ML PO SOLN
325.0000 mg | ORAL | Status: DC | PRN
  Filled 2016-04-17: qty 20.3

## 2016-04-17 MED ORDER — ASPIRIN EC 81 MG PO TBEC
81.0000 mg | DELAYED_RELEASE_TABLET | Freq: Every day | ORAL | Status: DC
  Administered 2016-04-19 – 2016-04-25 (×7): 81 mg via ORAL
  Filled 2016-04-17 (×7): qty 1

## 2016-04-17 MED ORDER — MAGNESIUM SULFATE 4 GM/100ML IV SOLN
4.0000 g | Freq: Once | INTRAVENOUS | Status: AC
  Administered 2016-04-17: 4 g via INTRAVENOUS
  Filled 2016-04-17: qty 100

## 2016-04-17 MED ORDER — ONDANSETRON HCL 4 MG/2ML IJ SOLN
4.0000 mg | Freq: Four times a day (QID) | INTRAMUSCULAR | Status: DC | PRN
  Filled 2016-04-17: qty 2

## 2016-04-17 MED ORDER — ADULT MULTIVITAMIN W/MINERALS CH
1.0000 | ORAL_TABLET | Freq: Every day | ORAL | Status: DC
  Administered 2016-04-18 – 2016-04-25 (×7): 1 via ORAL
  Filled 2016-04-17 (×7): qty 1

## 2016-04-17 MED ORDER — SENNOSIDES-DOCUSATE SODIUM 8.6-50 MG PO TABS
1.0000 | ORAL_TABLET | Freq: Every day | ORAL | Status: DC
  Administered 2016-04-17 – 2016-04-23 (×4): 1 via ORAL
  Filled 2016-04-17 (×7): qty 1

## 2016-04-17 MED ORDER — LACTATED RINGERS IV SOLN
INTRAVENOUS | Status: DC | PRN
  Administered 2016-04-17 (×2): via INTRAVENOUS

## 2016-04-17 MED ORDER — NALOXONE HCL 0.4 MG/ML IJ SOLN
0.4000 mg | INTRAMUSCULAR | Status: DC | PRN
  Filled 2016-04-17: qty 1

## 2016-04-17 MED ORDER — ONDANSETRON HCL 4 MG/2ML IJ SOLN
INTRAMUSCULAR | Status: AC
  Filled 2016-04-17: qty 2

## 2016-04-17 MED ORDER — PHENYLEPHRINE HCL 10 MG/ML IJ SOLN
INTRAMUSCULAR | Status: DC | PRN
  Administered 2016-04-17: 80 ug via INTRAVENOUS

## 2016-04-17 MED ORDER — BUPIVACAINE HCL (PF) 0.5 % IJ SOLN
INTRAMUSCULAR | Status: DC | PRN
  Administered 2016-04-17: 10 mL

## 2016-04-17 MED ORDER — PRAVASTATIN SODIUM 20 MG PO TABS
20.0000 mg | ORAL_TABLET | Freq: Every evening | ORAL | Status: DC
  Administered 2016-04-17 – 2016-04-21 (×5): 20 mg via ORAL
  Filled 2016-04-17 (×5): qty 1

## 2016-04-17 MED ORDER — DIPHENHYDRAMINE HCL 12.5 MG/5ML PO ELIX
12.5000 mg | ORAL_SOLUTION | Freq: Four times a day (QID) | ORAL | Status: DC | PRN
  Filled 2016-04-17: qty 5

## 2016-04-17 MED ORDER — ROCURONIUM BROMIDE 50 MG/5ML IV SOLN
INTRAVENOUS | Status: AC
  Filled 2016-04-17: qty 2

## 2016-04-17 MED ORDER — OXYCODONE HCL 5 MG/5ML PO SOLN: 5.0000 mg | Freq: Once | ORAL | Status: DC | PRN

## 2016-04-17 MED ORDER — BUPIVACAINE HCL (PF) 0.5 % IJ SOLN
INTRAMUSCULAR | Status: AC
  Filled 2016-04-17: qty 10

## 2016-04-17 MED ORDER — ROCURONIUM BROMIDE 100 MG/10ML IV SOLN
INTRAVENOUS | Status: DC | PRN
  Administered 2016-04-17: 10 mg via INTRAVENOUS
  Administered 2016-04-17: 50 mg via INTRAVENOUS
  Administered 2016-04-17 (×2): 20 mg via INTRAVENOUS

## 2016-04-17 MED ORDER — 0.9 % SODIUM CHLORIDE (POUR BTL) OPTIME
TOPICAL | Status: DC | PRN
  Administered 2016-04-17: 1000 mL
  Administered 2016-04-17: 2000 mL

## 2016-04-17 MED ORDER — VASOPRESSIN 20 UNIT/ML IV SOLN
INTRAVENOUS | Status: DC | PRN
  Administered 2016-04-17 (×2): 1 [IU] via INTRAVENOUS

## 2016-04-17 MED ORDER — SODIUM CHLORIDE 0.9% FLUSH: 9.0000 mL | INTRAVENOUS | Status: DC | PRN

## 2016-04-17 MED ORDER — PANTOPRAZOLE SODIUM 40 MG PO TBEC
40.0000 mg | DELAYED_RELEASE_TABLET | Freq: Every day | ORAL | Status: DC
  Administered 2016-04-17 – 2016-04-25 (×9): 40 mg via ORAL
  Filled 2016-04-17 (×9): qty 1

## 2016-04-17 MED ORDER — TRAMADOL HCL 50 MG PO TABS
50.0000 mg | ORAL_TABLET | Freq: Four times a day (QID) | ORAL | Status: DC | PRN
  Administered 2016-04-19 – 2016-04-25 (×9): 50 mg via ORAL
  Filled 2016-04-17 (×9): qty 1

## 2016-04-17 MED ORDER — BISACODYL 5 MG PO TBEC
10.0000 mg | DELAYED_RELEASE_TABLET | Freq: Every day | ORAL | Status: DC
  Administered 2016-04-19 – 2016-04-24 (×5): 10 mg via ORAL
  Filled 2016-04-17 (×6): qty 2

## 2016-04-17 MED ORDER — HYDROMORPHONE HCL 1 MG/ML IJ SOLN
0.2500 mg | INTRAMUSCULAR | Status: DC | PRN
  Administered 2016-04-17 (×2): 0.5 mg via INTRAVENOUS

## 2016-04-17 MED ORDER — ACETAMINOPHEN 500 MG PO TABS
1000.0000 mg | ORAL_TABLET | Freq: Four times a day (QID) | ORAL | Status: AC
Start: 2016-04-17 — End: 2016-04-22
  Administered 2016-04-18 – 2016-04-22 (×7): 1000 mg via ORAL
  Filled 2016-04-17 (×11): qty 2

## 2016-04-17 MED ORDER — FENTANYL CITRATE (PF) 250 MCG/5ML IJ SOLN
INTRAMUSCULAR | Status: AC
  Filled 2016-04-17: qty 5

## 2016-04-17 MED ORDER — CETYLPYRIDINIUM CHLORIDE 0.05 % MT LIQD
7.0000 mL | Freq: Two times a day (BID) | OROMUCOSAL | Status: DC
  Administered 2016-04-18 – 2016-04-25 (×11): 7 mL via OROMUCOSAL

## 2016-04-17 MED ORDER — POTASSIUM CHLORIDE 10 MEQ/50ML IV SOLN
10.0000 meq | INTRAVENOUS | Status: AC
  Administered 2016-04-17 (×3): 10 meq via INTRAVENOUS
  Filled 2016-04-17 (×3): qty 50

## 2016-04-17 MED ORDER — CEFUROXIME SODIUM 1.5 G IJ SOLR
1.5000 g | Freq: Two times a day (BID) | INTRAMUSCULAR | Status: AC
  Administered 2016-04-17 – 2016-04-18 (×2): 1.5 g via INTRAVENOUS
  Filled 2016-04-17 (×2): qty 1.5

## 2016-04-17 MED ORDER — ALBUMIN HUMAN 5 % IV SOLN
INTRAVENOUS | Status: DC | PRN
  Administered 2016-04-17 (×2): via INTRAVENOUS

## 2016-04-17 MED ORDER — PROPOFOL 10 MG/ML IV BOLUS
INTRAVENOUS | Status: AC
  Filled 2016-04-17: qty 20

## 2016-04-17 MED ORDER — OXYCODONE HCL 5 MG PO TABS
5.0000 mg | ORAL_TABLET | ORAL | Status: DC | PRN
Start: 2016-04-17 — End: 2016-04-19
  Filled 2016-04-17: qty 2

## 2016-04-17 MED ORDER — SODIUM CHLORIDE 0.9 % IV SOLN: Freq: Once | INTRAVENOUS | Status: AC

## 2016-04-17 MED ORDER — PROPOFOL 10 MG/ML IV BOLUS
INTRAVENOUS | Status: DC | PRN
  Administered 2016-04-17: 50 mg via INTRAVENOUS
  Administered 2016-04-17: 30 mg via INTRAVENOUS
  Administered 2016-04-17: 100 mg via INTRAVENOUS

## 2016-04-17 MED ORDER — INSULIN ASPART 100 UNIT/ML ~~LOC~~ SOLN
0.0000 [IU] | Freq: Four times a day (QID) | SUBCUTANEOUS | Status: DC
Start: 2016-04-17 — End: 2016-04-18
  Administered 2016-04-17: 4 [IU] via SUBCUTANEOUS
  Administered 2016-04-18: 8 [IU] via SUBCUTANEOUS

## 2016-04-17 MED ORDER — ACETAMINOPHEN 160 MG/5ML PO SOLN
1000.0000 mg | Freq: Four times a day (QID) | ORAL | Status: AC
  Administered 2016-04-19 – 2016-04-22 (×5): 1000 mg via ORAL
  Filled 2016-04-17 (×5): qty 40.6

## 2016-04-17 MED ORDER — DOCUSATE SODIUM 100 MG PO CAPS
100.0000 mg | ORAL_CAPSULE | Freq: Every evening | ORAL | Status: DC
  Administered 2016-04-18 – 2016-04-23 (×5): 100 mg via ORAL
  Filled 2016-04-17 (×5): qty 1

## 2016-04-17 MED ORDER — POTASSIUM CHLORIDE 10 MEQ/50ML IV SOLN: 10.0000 meq | Freq: Every day | INTRAVENOUS | Status: DC | PRN

## 2016-04-17 MED ORDER — BUPIVACAINE 0.5 % ON-Q PUMP SINGLE CATH 400 ML
400.0000 mL | INJECTION | Status: DC
  Administered 2016-04-17: 400 mL
  Filled 2016-04-17: qty 400

## 2016-04-17 MED ORDER — DEXAMETHASONE SODIUM PHOSPHATE 4 MG/ML IJ SOLN
INTRAMUSCULAR | Status: DC | PRN
  Administered 2016-04-17: 5 mg via INTRAVENOUS

## 2016-04-17 MED ORDER — FENTANYL 40 MCG/ML IV SOLN
INTRAVENOUS | Status: DC
  Administered 2016-04-17: 20 ug via INTRAVENOUS
  Administered 2016-04-17: 14:00:00 via INTRAVENOUS
  Administered 2016-04-18: 60 ug via INTRAVENOUS
  Administered 2016-04-18: 10 ug via INTRAVENOUS
  Administered 2016-04-18: 20 ug via INTRAVENOUS
  Administered 2016-04-18: 0 ug via INTRAVENOUS
  Administered 2016-04-18: 20 ug via INTRAVENOUS
  Administered 2016-04-19: 60 ug via INTRAVENOUS

## 2016-04-17 MED ORDER — EPHEDRINE 5 MG/ML INJ
INTRAVENOUS | Status: AC
Start: 2016-04-17 — End: 2016-04-17
  Filled 2016-04-17: qty 10

## 2016-04-17 MED ORDER — ONDANSETRON HCL 4 MG/2ML IJ SOLN
INTRAMUSCULAR | Status: DC | PRN
  Administered 2016-04-17: 4 mg via INTRAVENOUS

## 2016-04-17 MED ORDER — HYDROMORPHONE HCL 1 MG/ML IJ SOLN
INTRAMUSCULAR | Status: AC
  Administered 2016-04-17: 0.5 mg via INTRAVENOUS
  Filled 2016-04-17: qty 1

## 2016-04-17 MED ORDER — SODIUM CHLORIDE 0.9 % IV SOLN: Freq: Once | INTRAVENOUS | Status: DC

## 2016-04-17 MED ORDER — ACETAMINOPHEN 325 MG PO TABS: 325.0000 mg | ORAL_TABLET | ORAL | Status: DC | PRN

## 2016-04-17 MED ORDER — MULTI-VITAMIN/MINERALS PO TABS: 1.0000 | ORAL_TABLET | Freq: Every day | ORAL | Status: DC

## 2016-04-17 MED ORDER — PHENYLEPHRINE HCL 10 MG/ML IJ SOLN
10.0000 mg | INTRAVENOUS | Status: DC | PRN
  Administered 2016-04-17: 20 ug/min via INTRAVENOUS

## 2016-04-17 MED ORDER — DEXTROSE-NACL 5-0.9 % IV SOLN
INTRAVENOUS | Status: DC
  Administered 2016-04-17 – 2016-04-18 (×2): via INTRAVENOUS

## 2016-04-17 MED ORDER — FENTANYL CITRATE (PF) 250 MCG/5ML IJ SOLN
INTRAMUSCULAR | Status: DC | PRN
  Administered 2016-04-17: 50 ug via INTRAVENOUS
  Administered 2016-04-17: 100 ug via INTRAVENOUS
  Administered 2016-04-17 (×7): 50 ug via INTRAVENOUS

## 2016-04-17 MED ORDER — PHENYLEPHRINE 40 MCG/ML (10ML) SYRINGE FOR IV PUSH (FOR BLOOD PRESSURE SUPPORT)
PREFILLED_SYRINGE | INTRAVENOUS | Status: AC
  Filled 2016-04-17: qty 10

## 2016-04-17 MED ORDER — FENTANYL 40 MCG/ML IV SOLN
INTRAVENOUS | Status: AC
  Administered 2016-04-17: 20 ug via INTRAVENOUS
  Filled 2016-04-17: qty 25

## 2016-04-17 SURGICAL SUPPLY — 97 items
ADH SKN CLS APL DERMABOND .7 (GAUZE/BANDAGES/DRESSINGS) ×2
APPLIER CLIP ROT 10 11.4 M/L (STAPLE)
APR CLP MED LRG 11.4X10 (STAPLE)
BAG SPEC RTRVL LRG 6X4 10 (ENDOMECHANICALS)
BIT DRILL 7/64X5 DISP (BIT) ×2 IMPLANT
CANISTER SUCTION 2500CC (MISCELLANEOUS) ×4 IMPLANT
CATH KIT ON Q 5IN SLV (PAIN MANAGEMENT) IMPLANT
CATH KIT ON-Q SILVERSOAK 5 (CATHETERS) IMPLANT
CATH KIT ON-Q SILVERSOAK 5IN (CATHETERS) ×4 IMPLANT
CATH THORACIC 28FR (CATHETERS) IMPLANT
CATH THORACIC 28FR RT ANG (CATHETERS) IMPLANT
CATH THORACIC 36FR (CATHETERS) IMPLANT
CATH THORACIC 36FR RT ANG (CATHETERS) IMPLANT
CLIP APPLIE ROT 10 11.4 M/L (STAPLE) IMPLANT
CLIP TI MEDIUM 6 (CLIP) IMPLANT
CONN ST 1/4X3/8  BEN (MISCELLANEOUS) ×4
CONN ST 1/4X3/8 BEN (MISCELLANEOUS) IMPLANT
CONN Y 3/8X3/8X3/8  BEN (MISCELLANEOUS) ×2
CONN Y 3/8X3/8X3/8 BEN (MISCELLANEOUS) ×2 IMPLANT
CONT SPEC 4OZ CLIKSEAL STRL BL (MISCELLANEOUS) ×10 IMPLANT
COVER SURGICAL LIGHT HANDLE (MISCELLANEOUS) ×4 IMPLANT
DERMABOND ADVANCED (GAUZE/BANDAGES/DRESSINGS) ×2
DERMABOND ADVANCED .7 DNX12 (GAUZE/BANDAGES/DRESSINGS) ×2 IMPLANT
DRAIN CHANNEL 28F RND 3/8 FF (WOUND CARE) ×2 IMPLANT
DRAIN CHANNEL 32F RND 10.7 FF (WOUND CARE) ×2 IMPLANT
DRAPE C-ARM 42X72 X-RAY (DRAPES) ×2 IMPLANT
DRAPE LAPAROSCOPIC ABDOMINAL (DRAPES) ×4 IMPLANT
DRAPE WARM FLUID 44X44 (DRAPE) ×2 IMPLANT
ELECT BLADE 6.5 EXT (BLADE) ×2 IMPLANT
ELECT REM PT RETURN 9FT ADLT (ELECTROSURGICAL) ×4
ELECTRODE REM PT RTRN 9FT ADLT (ELECTROSURGICAL) ×2 IMPLANT
FELT TEFLON 1X6 (MISCELLANEOUS) ×2 IMPLANT
GAUZE SPONGE 4X4 12PLY STRL (GAUZE/BANDAGES/DRESSINGS) ×2 IMPLANT
GLOVE ECLIPSE 6.5 STRL STRAW (GLOVE) ×2 IMPLANT
GLOVE SURG SIGNA 7.5 PF LTX (GLOVE) ×8 IMPLANT
GOWN STRL REUS W/ TWL LRG LVL3 (GOWN DISPOSABLE) ×4 IMPLANT
GOWN STRL REUS W/ TWL XL LVL3 (GOWN DISPOSABLE) ×2 IMPLANT
GOWN STRL REUS W/TWL LRG LVL3 (GOWN DISPOSABLE) ×12
GOWN STRL REUS W/TWL XL LVL3 (GOWN DISPOSABLE) ×8
HEMOSTAT SURGICEL 2X14 (HEMOSTASIS) IMPLANT
KIT BASIN OR (CUSTOM PROCEDURE TRAY) ×4 IMPLANT
KIT PLEURX DRAIN CATH 1000ML (MISCELLANEOUS) ×2 IMPLANT
KIT PLEURX DRAIN CATH 15.5FR (DRAIN) ×2 IMPLANT
KIT ROOM TURNOVER OR (KITS) ×4 IMPLANT
KIT SUCTION CATH 14FR (SUCTIONS) ×2 IMPLANT
NS IRRIG 1000ML POUR BTL (IV SOLUTION) ×8 IMPLANT
PACK CHEST (CUSTOM PROCEDURE TRAY) ×4 IMPLANT
PACK GENERAL/GYN (CUSTOM PROCEDURE TRAY) ×2 IMPLANT
PAD ARMBOARD 7.5X6 YLW CONV (MISCELLANEOUS) ×8 IMPLANT
POUCH ENDO CATCH II 15MM (MISCELLANEOUS) IMPLANT
POUCH SPECIMEN RETRIEVAL 10MM (ENDOMECHANICALS) IMPLANT
SEALANT PROGEL (MISCELLANEOUS) IMPLANT
SEALANT SURG COSEAL 4ML (VASCULAR PRODUCTS) IMPLANT
SEALANT SURG COSEAL 8ML (VASCULAR PRODUCTS) IMPLANT
SET DRAINAGE LINE (MISCELLANEOUS) IMPLANT
SOLUTION ANTI FOG 6CC (MISCELLANEOUS) ×4 IMPLANT
SPECIMEN JAR MEDIUM (MISCELLANEOUS) ×2 IMPLANT
SPONGE GAUZE 4X4 12PLY STER LF (GAUZE/BANDAGES/DRESSINGS) ×2 IMPLANT
SPONGE INTESTINAL PEANUT (DISPOSABLE) ×22 IMPLANT
SPONGE TONSIL 1 RF SGL (DISPOSABLE) ×6 IMPLANT
SUT ETHILON 3 0 FSL (SUTURE) ×2 IMPLANT
SUT PROLENE 3 0 SH DA (SUTURE) ×2 IMPLANT
SUT PROLENE 4 0 RB 1 (SUTURE) ×24
SUT PROLENE 4 0 SH DA (SUTURE) ×2 IMPLANT
SUT PROLENE 4-0 RB1 .5 CRCL 36 (SUTURE) IMPLANT
SUT SILK  1 MH (SUTURE) ×6
SUT SILK 1 MH (SUTURE) ×4 IMPLANT
SUT SILK 2 0 SH (SUTURE) ×4 IMPLANT
SUT SILK 2 0SH CR/8 30 (SUTURE) IMPLANT
SUT SILK 3 0SH CR/8 30 (SUTURE) IMPLANT
SUT VIC AB 1 CTX 27 (SUTURE) ×2 IMPLANT
SUT VIC AB 1 CTX 36 (SUTURE) ×12
SUT VIC AB 1 CTX36XBRD ANBCTR (SUTURE) IMPLANT
SUT VIC AB 2-0 CTX 36 (SUTURE) ×6 IMPLANT
SUT VIC AB 2-0 UR6 27 (SUTURE) IMPLANT
SUT VIC AB 3-0 MH 27 (SUTURE) IMPLANT
SUT VIC AB 3-0 X1 27 (SUTURE) ×6 IMPLANT
SUT VICRYL 2 TP 1 (SUTURE) ×2 IMPLANT
SWAB COLLECTION DEVICE MRSA (MISCELLANEOUS) IMPLANT
SYR CONTROL 10ML LL (SYRINGE) ×2 IMPLANT
SYSTEM SAHARA CHEST DRAIN ATS (WOUND CARE) ×4 IMPLANT
TAPE CLOTH 4X10 WHT NS (GAUZE/BANDAGES/DRESSINGS) ×4 IMPLANT
TAPE CLOTH SURG 6X10 WHT LF (GAUZE/BANDAGES/DRESSINGS) ×2 IMPLANT
TIP APPLICATOR SPRAY EXTEND 16 (VASCULAR PRODUCTS) IMPLANT
TOWEL OR 17X24 6PK STRL BLUE (TOWEL DISPOSABLE) ×6 IMPLANT
TOWEL OR 17X26 10 PK STRL BLUE (TOWEL DISPOSABLE) ×4 IMPLANT
TRAP SPECIMEN MUCOUS 40CC (MISCELLANEOUS) ×4 IMPLANT
TRAY FOLEY CATH 16FRSI W/METER (SET/KITS/TRAYS/PACK) ×4 IMPLANT
TROCAR XCEL BLADELESS 5X75MML (TROCAR) ×4 IMPLANT
TROCAR XCEL NON-BLD 5MMX100MML (ENDOMECHANICALS) ×2 IMPLANT
TUBE ANAEROBIC SPECIMEN COL (MISCELLANEOUS) IMPLANT
TUBE CONNECTING 20'X1/4 (TUBING) ×1
TUBE CONNECTING 20X1/4 (TUBING) ×1 IMPLANT
TUNNELER SHEATH ON-Q 11GX8 DSP (PAIN MANAGEMENT) ×2 IMPLANT
VALVE REPLACEMENT CAP (MISCELLANEOUS) IMPLANT
WATER STERILE IRR 1000ML POUR (IV SOLUTION) ×6 IMPLANT
YANKAUER SUCT BULB TIP NO VENT (SUCTIONS) ×2 IMPLANT

## 2016-04-17 NOTE — Anesthesia Postprocedure Evaluation (Deleted)
Anesthesia Post Note  Patient: Leslie Carr  Procedure(s) Performed: Procedure(s) (LRB): VIDEO ASSISTED THORACOSCOPY (Left) DRAINAGE OF PLEURAL EFFUSION (Left) PLEURAL BIOPSY (Left) POSSIBLE DECORTICATION (Left) POSSIBLE INSERTION PLEURAL DRAINAGE CATHETER (Left)  Patient location during evaluation: PACU Anesthesia Type: MAC Level of consciousness: awake Pain management: pain level controlled Vital Signs Assessment: post-procedure vital signs reviewed and stable Respiratory status: spontaneous breathing Cardiovascular status: stable Postop Assessment: no signs of nausea or vomiting Anesthetic complications: no    Last Vitals:  Filed Vitals:   04/17/16 0603  BP: 166/67  Pulse: 86  Temp: 36.8 C  Resp: 20    Last Pain:  Filed Vitals:   04/17/16 0604  PainSc: 1                  Trystyn Sitts

## 2016-04-17 NOTE — Transfer of Care (Signed)
Immediate Anesthesia Transfer of Care Note  Patient: Leslie Carr  Procedure(s) Performed: Procedure(s):  Left VIDEO ASSISTED THORACOSCOPY with Drainage of Pleural Effusion, Pleural Biopsy, Visceral Pleural Decortication and Placement of On-Q pain pump (Left)  Left PLEURAL BIOPSY (Left) Visceral Pleural DECORTICATION (Left) DRAINAGE OF Left PLEURAL EFFUSION (Left)  Patient Location: PACU  Anesthesia Type:General  Level of Consciousness: awake and alert   Airway & Oxygen Therapy: Patient Spontanous Breathing and Patient connected to face mask oxygen  Post-op Assessment: Report given to RN, Post -op Vital signs reviewed and stable and Patient moving all extremities X 4  Post vital signs: Reviewed and stable  Last Vitals:  Filed Vitals:   04/17/16 0603  BP: 166/67  Pulse: 86  Temp: 36.8 C  Resp: 20    Last Pain:  Filed Vitals:   04/17/16 0604  PainSc: 1       Patients Stated Pain Goal: 2 ([id number])  Complications: No apparent anesthesia complications

## 2016-04-17 NOTE — Op Note (Signed)
NAMENASHAYLA, DEC              ACCOUNT NO.:  [id number]  MEDICAL RECORD NO.:  [id number]  LOCATION:  2S03C                        FACILITY:  [facility]  PHYSICIAN:  Revonda Standard. Roxan Hockey, M.D.DATE OF BIRTH:  03/28/1931  DATE OF PROCEDURE:  04/17/2016 DATE OF DISCHARGE:                              OPERATIVE REPORT   PREOPERATIVE DIAGNOSIS:  Recurrent left pleural effusion.  POSTOPERATIVE DIAGNOSIS:  Recurrent left pleural effusion.  PROCEDURE:   Left video-assisted thoracoscopy with conversion to thoracotomy Drainage of left pleural effusion Pleural biopsies Visceral pleural decortication  On-Q local anesthetic catheter placement.  SURGEON:  Revonda Standard. Roxan Hockey, M.D.  ASSISTANT:  Lars Pinks, PA.  ANESTHESIA:  General.  FINDINGS:  Large pleural effusion, slightly greater than a liter of fluid.  Pleural biopsy frozen section revealed chronic inflammation. Lung trapped. Good re-expansion of the lung after decortication. Bleeding from pulmonary artery necessitated conversion to open thoracotomy.  CLINICAL NOTE:  Ms. Orfanos is an 82 year old woman who recently moved to [place] from [place].  She has a chronic recurrent left pleural effusion.  On PET-CT, there was some hypermetabolic activity in the pleura.  She was advised to undergo left VATS, drainage of the pleural effusion with pleural biopsies and possible decortication or pleural catheter placement.  The indications, risks, benefits, and alternatives were discussed in detail with the patient.  She understood and accepted the risks and wished to proceed.  OPERATIVE NOTE:  Ms. Pirolli was brought to the preoperative holding area on April 17, 2016.  Anesthesia placed a central line and arterial blood pressure monitoring line.  She was taken to the operating room, anesthetized, and intubated with a double-lumen endotracheal tube. Intravenous antibiotics were administered.  A Foley catheter was  placed. Sequential compression devices were placed on the calves for DVT prophylaxis.  She was placed in a right lateral decubitus position, and the left chest was prepped and draped in usual sterile fashion.  A small incision was made in the seventh intercostal space in the midaxillary line.  A sucker was placed through the incision but there was minimal fluid. Palpation through the incision revealed adhesions in the area.  A small working incision was made in the fifth interspace anterolaterally, this was initially 5 cm in length.  After opening this incision, the pleural effusion was encountered.  It was a large effusion with over a liter of fluid evacuated.  The fluid was sent for cytology and culture.  A port then was inserted through the original port incision and a second port incision was made anterior to that.  The parietal pleural surface was smooth and white with thickened tissue.  The ribs were not visible. Biopsies were taken of the parietal pleura from the lateral lower portion of the chest wall and sent for frozen section.  The diaphragm likewise was encased in the same type of fibrous tissue as was the lung. The lower lobe was essentially completely encased, the upper lobe was also encased but to a lesser degree on the lateral aspect.  The frozen section returned showing chronic inflammation.  Decision made to attempt to decorticate the lung. The peel came off the lower lobe of the lung relatively easily.  This  was an extensive peel, and there were multiple invaginations and this was a slow and tedious process.  The lower lobe was freed up circumferentially.  There were a few small pleural tears. The major fissure was not apparent, although was later identified.  As the dissection was carried superiorly, there were adhesions of the upper lobe to the lateral chest wall and these were taken down.  A fissure was identified, this turned out to be an accessory fissure in the  anterior segment of the left upper lobe not the true major fissure.  The lung was densely adhered to the mediastinum anteriorly.  This was limiting re- expansion of the upper lobe so the adhesions were taken down.  As the lung was being retracted laterally.  There was pulmonary arterial bleeding noted. Pressure was applied with a sponge stick to control bleeding but there was significant blood loss.  Decision was made to convert to an open thoracotomy.  The incision was extended posteriorly, the superior rib was cut and retractor was placed.  The patient was resuscitated and only had transient hypotension during resuscitation.  Attempts to visualize the bleeding resulted in further bleeding and the decision was made to open the pericardium.  The peel overlying the pericardium was removed and the pericardium was incised.  The right pulmonary artery was identified and a clamp was placed on the right pulmonary artery.  The patient tolerated this well hemodynamically.  There was still significant backbleeding from thetear, but this maneuver did allow visualization of the bleeding site. It was a branch artery of the upper lobe had partially avulsed off the main pulmonary artery.  This was repaired with 4-0 Prolene sutures.  After achieving control of the bleeding, the clamp was removed from the pulmonary artery and no further bleeding was noted.  The decortication was completed on the upper lobe.  The chest was copiously irrigated with warm saline.  The patient did receive 3 units of packed red blood cells during the resuscitation.  An On-Q local anesthetic catheter was placed through a separate stab incision posteriorly and tunneled into a subpleural location, it was primed with 10 mL of 0.5% Marcaine.  A test inflation showed good aeration of the left lung with essentially complete re-expansion. There were multiple areas of small air leaks from pleural tears.  A 32-French Blake drain was  placed posteriorly and a 28- [place] Blake drain was placed along the diaphragm.  A 28-French chest tube was placed through a separate stab incision and directed anteriorly and laterally to the apex.  The tubes were secured to skin with #1 silk sutures.  There was a rib fracture noted after removal of the retractor. The rib was reapproximated with a #1 Vicryl suture.  The rib that had been cut was also reapproximated with a #1 Vicryl suture.  A 2-0 Vicryl pericostal sutures were placed.  The lung was reinflated, and the chest was closed.  The serratus and latissimus were closed with #1 Vicryl sutures, the subcutaneous tissue and skin were closed in standard fashion.  The chest tubes were placed to suction.  The patient then was extubated in the operating room and taken to the postanesthetic care unit in good condition.     Revonda Standard Roxan Hockey, M.D.     SCH/MEDQ  D:  04/17/2016  T:  04/17/2016  Job:  [id number]

## 2016-04-17 NOTE — Progress Notes (Signed)
CT surgery p.m. Rounds  Patient sitting in bed comfortable on room air Vital signs stable after  last unit of blood transfusion Chest tube drainage is minimal but 3+ air leak present Patient with pain well controlled using her PCA [facility] stable

## 2016-04-17 NOTE — Progress Notes (Signed)
04/17/2016 1630 Dr. Roxan Hockey at bedside and made aware of BMET results from PACU. Orders received for 3 Runs KCL IV and 4g Mag Sulfate IV. Orders enacted. Pt. And family updated on plan of care. Will continue to closely monitor patient.  Antonea Gaut, Arville Lime

## 2016-04-17 NOTE — Brief Op Note (Addendum)
04/17/2016  12:25 PM  PATIENT:  Leslie Carr  82 y.o. female  PRE-OPERATIVE DIAGNOSIS:  RECURRENT LEFT PLEURAL EFFUSION  POST-OPERATIVE DIAGNOSIS:  RECURRENT LEFT PLEURAL EFFUSION  PROCEDURE:   LEFT VIDEO ASSISTED THORACOSCOPY,  DRAINAGE OF LEFT PLEURAL EFFUSION,  PLEURAL BIOPSIES VISCERAL PLEURAL DECORTICATION, ON Q LOCAL ANESTHETIC CATHETER PLACEMENT  SURGEON:  Surgeon(s) and Role:    * Melrose Nakayama, MD - Primary  PHYSICIAN ASSISTANT: Lars Pinks PA-C  ANESTHESIA:   general  EBL:  Total I/O In: 2505 [I.V.:1000; Blood:1005; IV Piggyback:500] Out: 76 [Urine:100; Blood:2500]  BLOOD ADMINISTERED:Three CC PRBC  DRAINS: 28 French chest tube and 28 and 32 Blake drains all placed in the left pleural space   LOCAL MEDICATIONS USED:  MARCAINE     SPECIMEN:  Source of Specimen:  Pleural biopsies and left pleural fluid  DISPOSITION OF SPECIMEN:  Pathology and culture  COUNTS CORRECT:  YES  PLAN OF CARE: Admit to inpatient   PATIENT DISPOSITION:  PACU - hemodynamically stable.   Delay start of Pharmacological VTE agent (>24hrs) due to surgical blood loss or risk of bleeding: yes

## 2016-04-17 NOTE — H&P (View-Only) (Signed)
PCP is Corine Shelter, PA-C Referring Provider is Ennever, Rudell Cobb, MD  Chief Complaint  Patient presents with  . Pleural Effusion    Surgical eval on recurrent Pleural Effusion, Pet Scan 03/26/16, Chest CT 03/04/16    HPI: Mrs. Kurth is an 82 year old woman who was sent for consultation regarding a left pleural effusion.  Mrs. Leser is an 82 year old woman with a past medical history significant for hypercholesterolemia, breast cancer treated in 1985, and a stroke affecting her left arm in 2014. She lived in [place] until recently. In August 2016 she was found to have a left pleural effusion. She is not sure why imaging was done and thinks it may have just been a routine checkup. She did not have any complaints of shortness of breath. A thoracentesis was performed and drained 700 mL of fluid. Cytology was negative for malignancy but reported "abnormal T cells". Flow cytometry was negative. Because of this result her family physician felt that she needed to see an oncologist when she came to [place].  She recently saw Dr. Burney Gauze.  He recommended a repeat CT scan and it showed a large left pleural effusion. There also was a right middle lobe nodule. An ultrasound-guided thoracentesis was performed. There was incomplete reexpansion of the left lower lobe on the postthoracentesis film. She was already showing signs of fluid reaccumulation the following day. Cytology was negative for malignancy. A PET CT was done which showed a left pleural effusion with associated atelectasis. There was hypermetabolic enhancing rim along the pleura. There was mild hypermetabolic activity associated with the right middle lobe nodule which radiology felt was most likely infectious or inflammatory.  She has been feeling well. She has some residual left arm and hand weakness due to her stroke in 2014. She has not had any recent changes in her neurologic status. She also has paresthesias in the left arm. She  denies any chest pressure or tightness with exertion. She does not have any orthopnea or paroxysmal nocturnal dyspnea. She does get short of breath when she walks up a full flight of stairs, but can do so without stopping. She does have some mild left sided pain under the left breast. She has had a nonproductive cough. She is not aware of any hemoptysis or wheezing. Her appetite is good. She has lost 3 pounds in the past 3 months.  Zubrod Score: At the time of surgery this patient's most appropriate activity status/level should be described as: [x]     0    Normal activity, no symptoms []     1    Restricted in physical strenuous activity but ambulatory, able to do out light work []     2    Ambulatory and capable of self care, unable to do work activities, up and about >50 % of waking hours                              []     3    Only limited self care, in bed greater than 50% of waking hours []     4    Completely disabled, no self care, confined to bed or chair []     5    Moribund   Past Medical History  Diagnosis Date  . Breast cancer ([place])   . CHF (congestive heart failure) ([facility])   Hyperlipidemia Left pleural effusion  Past Surgical History  Hysterectomy 1976 Left mastectomy 1985   Family  history  Father and brother with heart disease Mother had cancer  Social History Social History  Substance Use Topics  . Smoking status: Former Research scientist (life sciences)  . Smokeless tobacco: None     Comment: Quit over 40 years ago  . Alcohol Use: None  Smoked in the 1960s, quit 50 years ago  Current Outpatient Prescriptions  Medication Sig Dispense Refill  . aspirin 81 MG tablet Take 81 mg by mouth daily.    . pravastatin (PRAVACHOL) 20 MG tablet Take 20 mg by mouth daily.     No current facility-administered medications for this visit.    Allergies  Allergen Reactions  . Lipitor [Atorvastatin] Other (See Comments)    Dizzy and make her head feel huge     Review of Systems  Constitutional:  Negative for fever, chills, activity change, appetite change and unexpected weight change.  HENT: Negative for trouble swallowing and voice change.   Eyes: Negative for visual disturbance.  Respiratory: Positive for cough (Nonproductive), shortness of breath (With heavy exertion) and wheezing.        No orthopnea or paroxysmal nocturnal dyspnea, mild pleuritic pain under her left breast  Cardiovascular: Negative for chest pain, palpitations and leg swelling.       No orthopnea or paroxysmal nocturnal dyspnea  Gastrointestinal: Negative for abdominal pain and blood in stool.  Genitourinary: Negative for hematuria and difficulty urinating.  Musculoskeletal: Negative for arthralgias and gait problem.  Neurological: Positive for weakness (LUE). Negative for syncope.       Paresthesias left arm  Hematological: Negative for adenopathy. Does not bruise/bleed easily.  All other systems reviewed and are negative.   BP 179/90 mmHg  Pulse 90  Resp 20  Ht 5\' 3"  (1.6 m)  Wt 134 lb (60.782 kg)  BMI 23.74 kg/m2  SpO2 91% Physical Exam  Constitutional: She is oriented to person, place, and time. She appears well-developed and well-nourished. No distress.  HENT:  Head: Normocephalic and atraumatic.  Eyes: Conjunctivae and EOM are normal. No scleral icterus.  Neck: Neck supple. No thyromegaly present.  Cardiovascular: Normal rate and regular rhythm.  Exam reveals no gallop and no friction rub.   Murmur (2/6 crescendo decrescendo right upper sternal border) heard. Pulmonary/Chest: Effort normal. No respiratory distress. She has no wheezes. She has no rales.  Absent breath sounds left base, diminished throughout the left side. Status post mastectomy with prosthesis on left  Abdominal: Soft. She exhibits no distension. There is no tenderness.  Musculoskeletal: She exhibits no edema.  Lymphadenopathy:    She has no cervical adenopathy.  Neurological: She is alert and oriented to person, place, and  time. No cranial nerve deficit. She exhibits abnormal muscle tone (Left arm and hand).  Diminished grip strength left hand  Skin: Skin is warm and dry.  Vitals reviewed.    Diagnostic Tests: CT CHEST, ABDOMEN, AND PELVIS WITH CONTRAST 03/04/2016  TECHNIQUE: Multidetector CT imaging of the chest, abdomen and pelvis was performed following the standard protocol during bolus administration of intravenous contrast.  CONTRAST: 18mL OMNIPAQUE IOHEXOL 300 MG/ML SOLN  COMPARISON: CT the abdomen and pelvis 11/05/2006.  FINDINGS: CT CHEST FINDINGS  Mediastinum/Lymph Nodes: Heart size is normal. There is no significant pericardial fluid, thickening or pericardial calcification. There is atherosclerosis of the thoracic aorta, the great vessels of the mediastinum and the coronary arteries, including calcified atherosclerotic plaque in the left anterior descending and right coronary arteries. In addition, there is mild aneurysmal dilatation of the ascending thoracic aorta (4.5 cm  in diameter). Extensive atheromatous plaque is noted in the left subclavian artery, where there is potentially hemodynamically significant stenosis as the vessel traverses the thoracic inlet. Thickening calcification of the aortic valve, mitral valve and mitral annulus. Esophagus is unremarkable in appearance. No axillary lymphadenopathy.  Lungs/Pleura: Large left pleural effusion which appears to be partially loculated anteriorly. Additionally, there are areas of pleural thickening and enhancement, suggestive of a malignant pleural effusion. There is near complete atelectasis of the left upper lobe. Partial subsegmental atelectasis of the left lower lobe. New mass-like lesion in the lateral segment of the right middle lobe measuring 1.7 x 3.1 x 1.7 cm (image 37 of series 3 and coronal image 69 of series 4). 3 mm right lower lobe pulmonary nodule (image 47 of series 3) is new compared to the prior  study, but nonspecific.  Musculoskeletal/Soft Tissues: There are no aggressive appearing lytic or blastic lesions noted in the visualized portions of the skeleton.  CT ABDOMEN AND PELVIS FINDINGS  Hepatobiliary: No cystic or solid hepatic lesions. No intra or extrahepatic biliary ductal dilatation. Gallbladder is normal in appearance.  Pancreas: No pancreatic mass. No pancreatic ductal dilatation. No pancreatic or peripancreatic fluid or inflammatory changes.  Spleen: Unremarkable.  Adrenals/Urinary Tract: Bilateral adrenal glands and bilateral kidneys are normal in appearance. No hydroureteronephrosis. Urinary bladder is normal in appearance.  Stomach/Bowel: Stomach is normal in appearance. No pathologic dilatation of small bowel or colon.  Vascular/Lymphatic: Atherosclerosis throughout the abdominal and pelvic vasculature, including focal saccular aneurysmal dilatation of the immediate infrarenal abdominal aorta which measures up to 3.5 x 3.2 cm. No lymphadenopathy noted in the abdomen or pelvis.  Reproductive: Status post hysterectomy. Ovaries are not confidently identified may be surgically absent or atrophic.  Other: No significant volume of ascites. No pneumoperitoneum.  Musculoskeletal: There are no aggressive appearing lytic or blastic lesions noted in the visualized portions of the skeleton.  IMPRESSION: 1. Large left pleural effusion with extensive pleural thickening and enhancement, concerning for a malignant pleural effusion. Correlation with thoracentesis is suggested. This is associated with extensive passive atelectasis in the left lung, as discussed above. 2. New mass-like area in the lateral segment of the right middle lobe measuring 1.7 x 3.1 x 1.7 cm. Differential considerations include a primary bronchogenic neoplasm, focus of infection/inflammation, or a new pulmonary metastasis. Correlation with PET-CT and/or biopsy should be considered  if clinically appropriate. 3. Atherosclerosis, including 2 vessel coronary artery disease. 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 Recommend followup by ultrasound in 3 years. This recommendation follows ACR consensus guidelines: White Paper of the ACR Incidental Findings Committee II on Vascular Findings. J Am Coll Radiol 2013; 10:789-794. 4. Additional incidental findings, as above.   Electronically Signed  By: Vinnie Langton M.D.  On: 03/04/2016 14:31  NUCLEAR MEDICINE PET SKULL BASE TO THIGH 03/26/2016  TECHNIQUE: 6.5 mCi F-18 FDG was injected intravenously. Full-ring PET imaging was performed from the skull base to thigh after the radiotracer. CT data was obtained and used for attenuation correction and anatomic localization.  FASTING BLOOD GLUCOSE: Value: 91 mg/dl  COMPARISON: CT scans 03/04/2016  FINDINGS: NECK  No hypermetabolic lymph nodes in the neck.  CHEST  Surgical changes from a left mastectomy. No findings for chest wall tumor or axillary adenopathy.  Persistent large loculated left pleural fluid collection with compressive atelectasis. There is a rim of hypermetabolism around pleural  fluid which could be inflammatory, infectious or neoplastic. I do not see any obvious nodularity. The SUV max is 2.9.  The right middle lobe lesion identified on the prior chest CT is weakly hypermetabolic with SUV max of 2.5. This could be an inflammatory or infectious process. Recommend continued surveillance.  Stable advanced atherosclerotic calcifications involving the thoracic aorta and branch vessels including the coronary arteries. The ascending aorta measures 4.4 cm.  ABDOMEN/PELVIS  No abnormal hypermetabolic activity within the liver, pancreas, adrenal glands, or spleen. No hypermetabolic lymph nodes in the abdomen  or pelvis.  Advanced atherosclerotic calcifications involving the aorta and branch vessels. Stable 3 cm infrarenal aortic aneurysm.  SKELETON  No findings to suggest osseous metastatic disease.  Severe degenerative changes involving the left shoulder. The the  IMPRESSION: 1. Persistent large loculated left pleural fluid collection and compressive lung atelectasis. Rim of hypermetabolism involving the pleural could be inflammatory, infectious or possibly neoplastic. No obvious nodularity and the SUV max is 2.9. 2. Stable right middle lobe lesion weakly hypermetabolic. This is most likely inflammatory or infectious. Recommend continued surveillance. 3. Severe atherosclerotic disease involving the thoracic and abdominal aorta and branch vessels.   Electronically Signed  By: Marijo Sanes M.D.  On: 03/26/2016 13:12   Echocardiogram 03/07/2016  Study Conclusions  - Procedure narrative: Transthoracic echocardiography. Technically  difficult study. - Left ventricle: The cavity size was normal. There was moderate  concentric hypertrophy. Systolic function was normal. The  estimated ejection fraction was 48%. Diffuse hypokinesis. Doppler  parameters are consistent with abnormal left ventricular  relaxation (grade 1 diastolic dysfunction). The E/e&' ratio is  >15, suggesting elevated LV filling pressure. - Aortic valve: Poorly visualized. Transvalvular velocity was  minimally increased. - Left atrium: The atrium was normal in size. - Pericardium, extracardiac: There was a right pleural effusion.  There was a left pleural effusion.  Impressions:  - Technically difficult study. LVEF 48%, diffuse hypokinesis,  moderate LVH, disatolic dysfunction, elevated LV filling  pressure, bilateral pleural effusions, normal LA size, mildly  increased aortic valve velocity.  I personally reviewed the CT and PET CT and I concur with the findings as noted  above. Impression: 82 year old woman with a recurrent left pleural effusion and a right lower lobe lung nodule. She has had thoracentesis on 2 occasions, the first in [place] in August and more recently in March here in [place]. Cytology was negative on both occasions. Of note after her recent thoracentesis she had incomplete reexpansion of the left lower lobe. PET/CT shows hypermetabolic enhancement of the parietal pleura, which is suspicious for possible malignancy, but could also be an indicator for chronic inflammatory state. She needs a pleural biopsy for diagnostic purposes.  I recommended to Mrs. Delsol and her daughter accompanied her that we proceed with left VATS, drainage of pleural effusion, pleural biopsy, possible decortication and possible pleural catheter placement. I doubt that a decortication will be possible, but will need to evaluate that intraoperatively before making a final determination. They understand that the decision to do a decortication and/or place a pleural catheter will depend on intraoperative findings. I explained the general nature of the procedure to them including the need for general anesthesia, the incisions to be used, the use of drainage tubes postoperatively, the intraoperative decision making, the expected hospital stay, and the overall recovery. I reviewed the indications, risks, benefits, and alternatives. They understand the risks include, but are not limited to death, MI, DVT, PE, bleeding, possible need for transfusion, infection, inability to  reexpand the lung, respiratory failure, as well as the possibility for other unforeseeable complications.  She accepts the risk and wishes to proceed with surgery  She does have a rather prominent heart murmur. Her recent echo showed possible mild aortic valve gradient, but no suggestion of significant aortic stenosis. She is not having any anginal-type symptoms.  She does have a history of stroke but has  minimal residual deficit.  Workup of the right middle lobe nodule be deferred while dealing with the left pleural effusion.  Plan: Left VATS, drainage of pleural effusion, pleural biopsy, possible decortication, possible pleural catheter placement on Thursday, 04/17/2016  Melrose Nakayama, MD [facility] [phone]

## 2016-04-17 NOTE — Interval H&P Note (Signed)
History and Physical Interval Note:  04/17/2016 7:34 AM  Leslie Carr  has presented today for surgery, with the diagnosis of LEFT PLEURAL EFFUSION  The various methods of treatment have been discussed with the patient and family. After consideration of risks, benefits and other options for treatment, the patient has consented to  Procedure(s): VIDEO ASSISTED THORACOSCOPY (Left) DRAINAGE OF PLEURAL EFFUSION (Left) PLEURAL BIOPSY (Left) POSSIBLE DECORTICATION (Left) POSSIBLE INSERTION PLEURAL DRAINAGE CATHETER (Left) as a surgical intervention .  The patient's history has been reviewed, patient examined, no change in status, stable for surgery.  I have reviewed the patient's chart and labs.  Questions were answered to the patient's satisfaction.     Melrose Nakayama

## 2016-04-17 NOTE — Anesthesia Procedure Notes (Addendum)
Procedure Name: Intubation Date/Time: 04/17/2016 7:58 AM Performed by: Ollen Bowl Pre-anesthesia Checklist: Patient identified, Emergency Drugs available, Suction available, Patient being monitored and Timeout performed Patient Re-evaluated:Patient Re-evaluated prior to inductionOxygen Delivery Method: Circle system utilized Preoxygenation: Pre-oxygenation with 100% oxygen Intubation Type: IV induction Ventilation: Mask ventilation without difficulty Laryngoscope Size: Mac and 3 Grade View: Grade I Endobronchial tube: Left, Double lumen EBT, EBT position confirmed by auscultation and EBT position confirmed by fiberoptic bronchoscope and 37 Fr Number of attempts: 1 Airway Equipment and Method: Patient positioned with wedge pillow,  Stylet,  Fiberoptic brochoscope and Oral airway Placement Confirmation: ETT inserted through vocal cords under direct vision,  positive ETCO2 and breath sounds checked- equal and bilateral Secured at: 29 cm Tube secured with: Tape Dental Injury: Teeth and Oropharynx as per pre-operative assessment    Central Venous Catheter Insertion Performed by: anesthesiologist Patient location: OR. Preanesthetic checklist: patient identified, IV checked, site marked, risks and benefits discussed, surgical consent, monitors and equipment checked, pre-op evaluation, timeout performed and anesthesia consent Position: supine Lidocaine 1% used for infiltration Landmarks identified and Seldinger technique used Catheter size: 8 Fr Central line was placed.Double lumen Procedure performed using ultrasound guided technique. Attempts: 1 Following insertion, dressing applied, line sutured and Biopatch. Post procedure assessment: blood return through all ports, free fluid flow and no air. Patient tolerated the procedure well with no immediate complications.

## 2016-04-17 NOTE — Anesthesia Preprocedure Evaluation (Signed)
Anesthesia Evaluation  Patient identified by MRN, date of birth, ID band Patient awake    Reviewed: Allergy & Precautions, NPO status , Patient's Chart, lab work & pertinent test results  History of Anesthesia Complications Negative for: history of anesthetic complications  Airway Mallampati: II  TM Distance: <3 FB Neck ROM: Full    Dental  (+) Teeth Intact   Pulmonary shortness of breath, former smoker,    breath sounds clear to auscultation       Cardiovascular +CHF   Rhythm:Regular     Neuro/Psych Left hand tingling  CVA, Residual Symptoms negative psych ROS   GI/Hepatic negative GI ROS, Neg liver ROS,   Endo/Other  negative endocrine ROS  Renal/GU negative Renal ROS     Musculoskeletal   Abdominal   Peds  Hematology negative hematology ROS (+)   Anesthesia Other Findings   Reproductive/Obstetrics                             Anesthesia Physical Anesthesia Plan  ASA: III  Anesthesia Plan: General   Post-op Pain Management:    Induction: Intravenous  Airway Management Planned: Double Lumen EBT  Additional Equipment: Arterial line  Intra-op Plan:   Post-operative Plan: Extubation in OR  Informed Consent: I have reviewed the patients History and Physical, chart, labs and discussed the procedure including the risks, benefits and alternatives for the proposed anesthesia with the patient or authorized representative who has indicated his/her understanding and acceptance.   Dental advisory given  Plan Discussed with: CRNA and Surgeon  Anesthesia Plan Comments:         Anesthesia Quick Evaluation

## 2016-04-18 ENCOUNTER — Inpatient Hospital Stay (HOSPITAL_COMMUNITY): Payer: Medicare Other

## 2016-04-18 ENCOUNTER — Encounter (HOSPITAL_COMMUNITY): Payer: Self-pay | Admitting: Thoracic Surgery (Cardiothoracic Vascular Surgery)

## 2016-04-18 LAB — TYPE AND SCREEN
ABO/RH(D): B POS
Antibody Screen: NEGATIVE
UNIT DIVISION: 0
UNIT DIVISION: 0
UNIT DIVISION: 0
Unit division: 0

## 2016-04-18 LAB — POCT I-STAT 3, ART BLOOD GAS (G3+)
Acid-base deficit: 3 mmol/L — ABNORMAL HIGH (ref 0.0–2.0)
BICARBONATE: 21.3 meq/L (ref 20.0–24.0)
O2 Saturation: 92 %
PH ART: 7.39 (ref 7.350–7.450)
PO2 ART: 64 mmHg — AB (ref 80.0–100.0)
TCO2: 22 mmol/L (ref 0–100)
pCO2 arterial: 35.1 mmHg (ref 35.0–45.0)

## 2016-04-18 LAB — GLUCOSE, CAPILLARY
GLUCOSE-CAPILLARY: 103 mg/dL — AB (ref 65–99)
GLUCOSE-CAPILLARY: 105 mg/dL — AB (ref 65–99)
Glucose-Capillary: 120 mg/dL — ABNORMAL HIGH (ref 65–99)
Glucose-Capillary: 160 mg/dL — ABNORMAL HIGH (ref 65–99)
Glucose-Capillary: 203 mg/dL — ABNORMAL HIGH (ref 65–99)

## 2016-04-18 LAB — BASIC METABOLIC PANEL
Anion gap: 5 (ref 5–15)
BUN: 20 mg/dL (ref 6–20)
CHLORIDE: 110 mmol/L (ref 101–111)
CO2: 21 mmol/L — AB (ref 22–32)
Calcium: 7.6 mg/dL — ABNORMAL LOW (ref 8.9–10.3)
Creatinine, Ser: 1.34 mg/dL — ABNORMAL HIGH (ref 0.44–1.00)
GFR calc Af Amer: 42 mL/min — ABNORMAL LOW (ref >60)
GFR calc non Af Amer: 36 mL/min — ABNORMAL LOW (ref >60)
GLUCOSE: 150 mg/dL — AB (ref 65–99)
POTASSIUM: 5.3 mmol/L — AB (ref 3.5–5.1)
Sodium: 136 mmol/L (ref 135–145)

## 2016-04-18 LAB — CBC
HCT: 33.9 % — ABNORMAL LOW (ref 36.0–46.0)
HEMOGLOBIN: 11.3 g/dL — AB (ref 12.0–15.0)
MCH: 28.3 pg (ref 26.0–34.0)
MCHC: 33.3 g/dL (ref 30.0–36.0)
MCV: 85 fL (ref 78.0–100.0)
Platelets: 159 10*3/uL (ref 150–400)
RBC: 3.99 MIL/uL (ref 3.87–5.11)
RDW: 14.8 % (ref 11.5–15.5)
WBC: 12.7 10*3/uL — ABNORMAL HIGH (ref 4.0–10.5)

## 2016-04-18 MED ORDER — INSULIN ASPART 100 UNIT/ML ~~LOC~~ SOLN
0.0000 [IU] | Freq: Three times a day (TID) | SUBCUTANEOUS | Status: DC
  Administered 2016-04-18: 2 [IU] via SUBCUTANEOUS
  Administered 2016-04-19 – 2016-04-22 (×4): 1 [IU] via SUBCUTANEOUS

## 2016-04-18 MED ORDER — SODIUM CHLORIDE 0.45 % IV SOLN
INTRAVENOUS | Status: DC
  Administered 2016-04-18 (×2): via INTRAVENOUS

## 2016-04-18 MED ORDER — ENOXAPARIN SODIUM 30 MG/0.3ML ~~LOC~~ SOLN
30.0000 mg | Freq: Every day | SUBCUTANEOUS | Status: DC
  Administered 2016-04-18 – 2016-04-23 (×6): 30 mg via SUBCUTANEOUS
  Filled 2016-04-18 (×6): qty 0.3

## 2016-04-18 NOTE — Progress Notes (Signed)
CCMD notified that patient had an episode of AFIB with HR in the 120's. AFIB currently ended but pt continues to have elevated HR. Nurse in room with pt as she began AFIB, pt began vomiting. Pt is having increased confusion. VSS. Nurse to notify MD Roxan Hockey.

## 2016-04-18 NOTE — Progress Notes (Signed)
1 Day Post-Op Procedure(s) (LRB):  Left VIDEO ASSISTED THORACOSCOPY with Drainage of Pleural Effusion, Pleural Biopsy, Visceral Pleural Decortication and Placement of On-Q pain pump (Left)  Left PLEURAL BIOPSY (Left) Visceral Pleural DECORTICATION (Left) DRAINAGE OF Left PLEURAL EFFUSION (Left) Subjective: C/o incisional pain, feels tired  Objective: Vital signs in last 24 hours: Temp:  [97 F (36.1 C)-98.2 F (36.8 C)] 98.1 F (36.7 C) (04/28 0400) Pulse Rate:  [74-115] 85 (04/28 0700) Cardiac Rhythm:  [-] Normal sinus rhythm (04/28 0600) Resp:  [6-22] 16 (04/28 0726) BP: (77-120)/(50-79) 101/60 mmHg (04/28 0700) SpO2:  [85 %-100 %] 100 % (04/28 0726) Arterial Line BP: (91-137)/(42-73) 107/49 mmHg (04/28 0700) Weight:  [135 lb 9.3 oz (61.5 kg)] 135 lb 9.3 oz (61.5 kg) (04/28 0600)  Hemodynamic parameters for last 24 hours:    Intake/Output from previous day: 04/27 0701 - 04/28 0700 In: 6000 [I.V.:3850; Blood:1370; IV Piggyback:780] Out: [id number] [Urine:665; Blood:2500; Chest Tube:780] Intake/Output this shift:    General appearance: alert, cooperative and no distress Neurologic: intact Heart: regular rate and rhythm Lungs: rubs on left Abdomen: normal findings: soft, non-tender + air leak  Lab Results:  Recent Labs  04/17/16 1330 04/18/16 0410  WBC 9.3 12.7*  HGB 7.1* 11.3*  HCT 21.8* 33.9*  PLT 111* 159   BMET:  Recent Labs  04/17/16 1330 04/18/16 0410  NA 141 136  K 3.8 5.3*  CL 118* 110  CO2 18* 21*  GLUCOSE 197* 150*  BUN 13 20  CREATININE 0.90 1.34*  CALCIUM 5.8* 7.6*    PT/INR:  Recent Labs  04/15/16 1333  LABPROT 13.6  INR 1.02   ABG    Component Value Date/Time   PHART 7.390 04/18/2016 0411   HCO3 21.3 04/18/2016 0411   TCO2 22 04/18/2016 0411   ACIDBASEDEF 3.0* 04/18/2016 0411   O2SAT 92.0 04/18/2016 0411   CBG (last 3)   Recent Labs  04/17/16 1702 04/18/16 0013 04/18/16 0611  GLUCAP 194* 203* 103*     Assessment/Plan: S/P Procedure(s) (LRB):  Left VIDEO ASSISTED THORACOSCOPY with Drainage of Pleural Effusion, Pleural Biopsy, Visceral Pleural Decortication and Placement of On-Q pain pump (Left)  Left PLEURAL BIOPSY (Left) Visceral Pleural DECORTICATION (Left) DRAINAGE OF Left PLEURAL EFFUSION (Left) Plan for transfer to step-down: see transfer orders  CV- in SR, BP a little on low side but no significant hypotension  RESP- s/p decortication. CXR shows excellent result  Air leak- keep CT to suction today  IS  RENAL- creatinine up this AM. Good UO, continue IVF.  Keep Foley  ENDO- CBG elevated last night.  Take D5 out of IVF. Change to AC/HS  Anemia- secondary to ABL, resolved after transfusion  SCD prophylaxis- SCD, add enoxaparin   LOS: 1 day    Leslie Carr 04/18/2016

## 2016-04-18 NOTE — Progress Notes (Signed)
CCMD notified that Pt HR reached 125. Pt is not sustaining elevated HR. Nurse continues to remind pt to use PCA to relieve pain. VSS, pt asymptomatic and resting comfortably

## 2016-04-18 NOTE — Care Management Note (Signed)
Case Management Note  Patient Details  Name: Leslie Carr MRN: [id number] Date of Birth: [birth year]  Subjective/Objective:       Pt s/p VATS             Action/Plan:  Pt is from home with husband on bottom floor - adult daughter stays upstairs.  CM will continue to monitor for disposition needs   Expected Discharge Date:                  Expected Discharge Plan:  Home/Self Care  In-House Referral:     Discharge planning Services  CM Consult  Post Acute Care Choice:    Choice offered to:     DME Arranged:    DME Agency:     HH Arranged:    HH Agency:     Status of Service:  In process, will continue to follow  Medicare Important Message Given:    Date Medicare IM Given:    Medicare IM give by:    Date Additional Medicare IM Given:    Additional Medicare Important Message give by:     If discussed at [place] of Stay Meetings, dates discussed:    Additional Comments:  Maryclare Labrador, RN 04/18/2016, 11:49 AM

## 2016-04-19 ENCOUNTER — Inpatient Hospital Stay (HOSPITAL_COMMUNITY): Payer: Medicare Other

## 2016-04-19 LAB — COMPREHENSIVE METABOLIC PANEL
ALT: 14 U/L (ref 14–54)
AST: 24 U/L (ref 15–41)
Albumin: 2 g/dL — ABNORMAL LOW (ref 3.5–5.0)
Alkaline Phosphatase: 42 U/L (ref 38–126)
Anion gap: 6 (ref 5–15)
BUN: 13 mg/dL (ref 6–20)
CHLORIDE: 101 mmol/L (ref 101–111)
CO2: 21 mmol/L — AB (ref 22–32)
CREATININE: 1.23 mg/dL — AB (ref 0.44–1.00)
Calcium: 7.4 mg/dL — ABNORMAL LOW (ref 8.9–10.3)
GFR calc non Af Amer: 40 mL/min — ABNORMAL LOW (ref >60)
GFR, EST AFRICAN AMERICAN: 46 mL/min — AB (ref >60)
Glucose, Bld: 134 mg/dL — ABNORMAL HIGH (ref 65–99)
Potassium: 4.8 mmol/L (ref 3.5–5.1)
SODIUM: 128 mmol/L — AB (ref 135–145)
Total Bilirubin: 0.5 mg/dL (ref 0.3–1.2)
Total Protein: 4.2 g/dL — ABNORMAL LOW (ref 6.5–8.1)

## 2016-04-19 LAB — GLUCOSE, CAPILLARY
GLUCOSE-CAPILLARY: 112 mg/dL — AB (ref 65–99)
Glucose-Capillary: 139 mg/dL — ABNORMAL HIGH (ref 65–99)
Glucose-Capillary: 137 mg/dL — ABNORMAL HIGH (ref 65–99)
Glucose-Capillary: 111 mg/dL — ABNORMAL HIGH (ref 65–99)

## 2016-04-19 LAB — CBC
HCT: 30.7 % — ABNORMAL LOW (ref 36.0–46.0)
Hemoglobin: 10.2 g/dL — ABNORMAL LOW (ref 12.0–15.0)
MCH: 28.8 pg (ref 26.0–34.0)
MCHC: 33.2 g/dL (ref 30.0–36.0)
MCV: 86.7 fL (ref 78.0–100.0)
PLATELETS: 156 10*3/uL (ref 150–400)
RBC: 3.54 MIL/uL — AB (ref 3.87–5.11)
RDW: 14.7 % (ref 11.5–15.5)
WBC: 17.1 10*3/uL — AB (ref 4.0–10.5)

## 2016-04-19 MED ORDER — METOCLOPRAMIDE HCL 5 MG/ML IJ SOLN
10.0000 mg | Freq: Four times a day (QID) | INTRAMUSCULAR | Status: AC
  Administered 2016-04-19 – 2016-04-21 (×8): 10 mg via INTRAVENOUS
  Filled 2016-04-19 (×8): qty 2

## 2016-04-19 MED ORDER — SODIUM CHLORIDE 0.9 % IV SOLN
INTRAVENOUS | Status: DC
  Administered 2016-04-19 (×2): 100 mL/h via INTRAVENOUS
  Administered 2016-04-21: 11:00:00 via INTRAVENOUS

## 2016-04-19 MED ORDER — METOPROLOL TARTRATE 5 MG/5ML IV SOLN
2.5000 mg | Freq: Once | INTRAVENOUS | Status: AC
  Administered 2016-04-19: 2.5 mg via INTRAVENOUS
  Filled 2016-04-19: qty 5

## 2016-04-19 NOTE — Progress Notes (Signed)
PCA discontinued. Wasted 60ml in [place], verified by Juanda Crumble, Therapist, sports. Disposed of properly.

## 2016-04-19 NOTE — Anesthesia Postprocedure Evaluation (Signed)
Anesthesia Post Note  Patient: Leslie Carr  Procedure(s) Performed: Procedure(s) (LRB):  Left VIDEO ASSISTED THORACOSCOPY with Drainage of Pleural Effusion, Pleural Biopsy, Visceral Pleural Decortication and Placement of On-Q pain pump (Left)  Left PLEURAL BIOPSY (Left) Visceral Pleural DECORTICATION (Left) DRAINAGE OF Left PLEURAL EFFUSION (Left)  Patient location during evaluation: PACU Anesthesia Type: General Level of consciousness: awake Pain management: pain level controlled Vital Signs Assessment: post-procedure vital signs reviewed and stable Respiratory status: spontaneous breathing Cardiovascular status: stable Postop Assessment: no signs of nausea or vomiting Anesthetic complications: no    Last Vitals:  Filed Vitals:   04/18/16 2326 04/19/16 0000  BP: 109/61   Pulse: 100   Temp: 36.7 C   Resp: 21 25    Last Pain:  Filed Vitals:   04/19/16 0004  PainSc: 6                  Tehila Sokolow

## 2016-04-19 NOTE — Progress Notes (Signed)
[address]       ,[place] [address]             [phone]      2 Days Post-Op Procedure(s) (LRB):  Left VIDEO ASSISTED THORACOSCOPY with Drainage of Pleural Effusion, Pleural Biopsy, Visceral Pleural Decortication and Placement of On-Q pain pump (Left)  Left PLEURAL BIOPSY (Left) Visceral Pleural DECORTICATION (Left) DRAINAGE OF Left PLEURAL EFFUSION (Left) Subjective: Feels nauseated and is somewhat confused  Objective: Vital signs in last 24 hours: Temp:  [98 F (36.7 C)-99.9 F (37.7 C)] 99.9 F (37.7 C) (04/29 0350) Pulse Rate:  [87-117] 91 (04/29 0800) Cardiac Rhythm:  [-] Junctional rhythm (04/29 0746) Resp:  [18-25] 20 (04/29 0800) BP: (88-136)/(45-71) 94/49 mmHg (04/29 0800) SpO2:  [92 %-100 %] 96 % (04/29 0800)  Hemodynamic parameters for last 24 hours:    Intake/Output from previous day: 04/28 0701 - 04/29 0700 In: [id number] [P.O.:240; I.V.:900; IV Piggyback:50] Out: 1630 [Urine:1060; Chest Tube:570] Intake/Output this shift:    General appearance: alert, cooperative, distracted and no distress Heart: regular rate and rhythm Lungs: dim in lower fields Abdomen: benign Extremities: no edema Wound: incis healing well  Lab Results:  Recent Labs  04/18/16 0410 04/19/16 0311  WBC 12.7* 17.1*  HGB 11.3* 10.2*  HCT 33.9* 30.7*  PLT 159 156   BMET:  Recent Labs  04/18/16 0410 04/19/16 0311  NA 136 128*  K 5.3* 4.8  CL 110 101  CO2 21* 21*  GLUCOSE 150* 134*  BUN 20 13  CREATININE 1.34* 1.23*  CALCIUM 7.6* 7.4*    PT/INR: No results for input(s): LABPROT, INR in the last 72 hours. ABG    Component Value Date/Time   PHART 7.390 04/18/2016 0411   HCO3 21.3 04/18/2016 0411   TCO2 22 04/18/2016 0411   ACIDBASEDEF 3.0* 04/18/2016 0411   O2SAT 92.0 04/18/2016 0411   CBG (last 3)   Recent Labs  04/18/16 1704 04/18/16 2130 04/19/16 0810  GLUCAP 160* 105* 139*    Meds Scheduled Meds: . acetaminophen  1,000 mg  Oral Q6H   Or  . acetaminophen (TYLENOL) oral liquid 160 mg/5 mL  1,000 mg Oral Q6H  . antiseptic oral rinse  7 mL Mouth Rinse BID  . aspirin EC  81 mg Oral Daily  . bisacodyl  10 mg Oral Daily  . docusate sodium  100 mg Oral QPM  . enoxaparin (LOVENOX) injection  30 mg Subcutaneous Daily  . insulin aspart  0-9 Units Subcutaneous TID WC  . multivitamin with minerals  1 tablet Oral Daily  . pantoprazole  40 mg Oral Daily  . pravastatin  20 mg Oral QPM  . senna-docusate  1 tablet Oral QHS   Continuous Infusions: . sodium chloride 100 mL/hr (04/19/16 0747)  . bupivacaine ON-Q pain pump     PRN Meds:.ondansetron (ZOFRAN) IV, oxyCODONE, potassium chloride, traMADol  Xrays Dg Chest Port 1 View  04/19/2016  CLINICAL DATA:  Patient with history of pleural effusion. EXAM: PORTABLE CHEST 1 VIEW COMPARISON:  Chest radiograph 04/18/2016. FINDINGS: Stable cardiac and mediastinal contours. Left IJ central venous catheter tip projects over the superior vena cava. Multiple left-sided chest tubes are stable in position. Low lung volumes. Slight interval worsening bibasilar airspace opacities. Persistent tiny left apical pneumothorax. IMPRESSION: Three left chest tubes remain in place. Unchanged small left apical pneumothorax. Worsening bilateral mid and lower lung airspace opacities favored to represent atelectasis. Electronically Signed   By: Dian Situ  Rosana Hoes M.D.   On: 04/19/2016 07:50   Dg Chest Port 1 View  04/18/2016  CLINICAL DATA:  Status post left VATS EXAM: PORTABLE CHEST 1 VIEW COMPARISON:  04/17/2016 FINDINGS: Left central venous line is again identified and stable. Three left-sided thoracostomy catheters are again identified. Small apical pneumothorax is again seen on the left and stable. No new focal abnormality is seen. The cardiac shadow is unchanged. No focal infiltrate is noted. IMPRESSION: Stable tiny left apical pneumothorax. No new focal abnormality is seen. Electronically Signed   By: Inez Catalina M.D.   On: 04/18/2016 07:27   Dg Chest Port 1 View  04/17/2016  CLINICAL DATA:  Status post VATS surgery with drainage of left pleural effusion and pleural decortication. EXAM: PORTABLE CHEST 1 VIEW COMPARISON:  04/15/2016 FINDINGS: A lateral left-sided chest tube is present as well as 2 additional surgical drains in the left pleural space. The left pleural effusion now appears completely drained. There is a very small left apical pneumothorax present of less than 5% volume. Left jugular central line present with the catheter tip at the origin of the SVC. No pulmonary edema identified. The heart size and mediastinal contours are normal. IMPRESSION: Small left apical pneumothorax of less than 5% volume postoperatively. Electronically Signed   By: Aletta Edouard M.D.   On: 04/17/2016 13:25   Results for orders placed or performed during the hospital encounter of 04/17/16  Culture, body fluid-bottle     Status: None (Preliminary result)   Collection Time: 04/17/16  8:48 AM  Result Value Ref Range Status   Specimen Description FLUID PLEURAL LEFT  Final   Special Requests NONE  Final   Culture NO GROWTH < 24 HOURS  Final   Report Status PENDING  Incomplete  Gram stain     Status: None   Collection Time: 04/17/16  8:48 AM  Result Value Ref Range Status   Specimen Description FLUID PLEURAL LEFT  Final   Special Requests NONE  Final   Gram Stain   Final    MODERATE WBC PRESENT, PREDOMINANTLY MONONUCLEAR NO ORGANISMS SEEN    Report Status 04/17/2016 FINAL  Final   Assessment/Plan: S/P Procedure(s) (LRB):  Left VIDEO ASSISTED THORACOSCOPY with Drainage of Pleural Effusion, Pleural Biopsy, Visceral Pleural Decortication and Placement of On-Q pain pump (Left)  Left PLEURAL BIOPSY (Left) Visceral Pleural DECORTICATION (Left) DRAINAGE OF Left PLEURAL EFFUSION (Left)  1 confusion and nausea, prob med related- PCA now not being used 2 conts with air leak- cont suction to CT, still a sig amt  of drainage- 630 cc yesterday 3 change IVF to saline- BP has been low at times 4 low grade temp, increased leukocytosis, some increased pulm opacities- may have to consider further abx coverage- monitor closely/push pulm toilet, cultures neg so far 5 H.H fairly  stable 6 creat improved, UO is good    LOS: 2 days    Isabella Roemmich E 04/19/2016

## 2016-04-19 NOTE — Progress Notes (Signed)
Notified MD Roxan Hockey.Orders received.

## 2016-04-20 ENCOUNTER — Inpatient Hospital Stay (HOSPITAL_COMMUNITY): Payer: Medicare Other

## 2016-04-20 LAB — CBC
HCT: 27.4 % — ABNORMAL LOW (ref 36.0–46.0)
Hemoglobin: 9.2 g/dL — ABNORMAL LOW (ref 12.0–15.0)
MCH: 29.8 pg (ref 26.0–34.0)
MCHC: 33.6 g/dL (ref 30.0–36.0)
MCV: 88.7 fL (ref 78.0–100.0)
PLATELETS: 155 10*3/uL (ref 150–400)
RBC: 3.09 MIL/uL — ABNORMAL LOW (ref 3.87–5.11)
RDW: 15.2 % (ref 11.5–15.5)
WBC: 9.7 10*3/uL (ref 4.0–10.5)

## 2016-04-20 LAB — BASIC METABOLIC PANEL
Anion gap: 6 (ref 5–15)
BUN: 13 mg/dL (ref 6–20)
CO2: 22 mmol/L (ref 22–32)
CREATININE: 1.09 mg/dL — AB (ref 0.44–1.00)
Calcium: 7.8 mg/dL — ABNORMAL LOW (ref 8.9–10.3)
Chloride: 109 mmol/L (ref 101–111)
GFR calc Af Amer: 53 mL/min — ABNORMAL LOW (ref >60)
GFR, EST NON AFRICAN AMERICAN: 46 mL/min — AB (ref >60)
GLUCOSE: 99 mg/dL (ref 65–99)
Potassium: 4.2 mmol/L (ref 3.5–5.1)
SODIUM: 137 mmol/L (ref 135–145)

## 2016-04-20 LAB — GLUCOSE, CAPILLARY
GLUCOSE-CAPILLARY: 91 mg/dL (ref 65–99)
GLUCOSE-CAPILLARY: 107 mg/dL — AB (ref 65–99)
GLUCOSE-CAPILLARY: 110 mg/dL — AB (ref 65–99)
GLUCOSE-CAPILLARY: 110 mg/dL — AB (ref 65–99)

## 2016-04-20 LAB — TSH: TSH: 2.961 u[IU]/mL (ref 0.350–4.500)

## 2016-04-20 MED ORDER — AMIODARONE HCL IN DEXTROSE 360-4.14 MG/200ML-% IV SOLN
60.0000 mg/h | INTRAVENOUS | Status: AC
  Administered 2016-04-20: 60 mg/h via INTRAVENOUS
  Filled 2016-04-20: qty 200

## 2016-04-20 MED ORDER — AMIODARONE LOAD VIA INFUSION
150.0000 mg | Freq: Once | INTRAVENOUS | Status: AC
  Administered 2016-04-20: 150 mg via INTRAVENOUS
  Filled 2016-04-20: qty 83.34

## 2016-04-20 MED ORDER — AMIODARONE HCL IN DEXTROSE 360-4.14 MG/200ML-% IV SOLN
30.0000 mg/h | INTRAVENOUS | Status: AC
  Administered 2016-04-20 – 2016-04-21 (×3): 30 mg/h via INTRAVENOUS
  Filled 2016-04-20 (×4): qty 200

## 2016-04-20 NOTE — Progress Notes (Signed)
Report received from [facility], RN for assumption of care.

## 2016-04-20 NOTE — Progress Notes (Addendum)
[address]       [place] [address]             [phone]      3 Days Post-Op Procedure(s) (LRB):  Left VIDEO ASSISTED THORACOSCOPY with Drainage of Pleural Effusion, Pleural Biopsy, Visceral Pleural Decortication and Placement of On-Q pain pump (Left)  Left PLEURAL BIOPSY (Left) Visceral Pleural DECORTICATION (Left) DRAINAGE OF Left PLEURAL EFFUSION (Left) Subjective: Feels a bit better, has gone into afib w/RVR in 130's  Objective: Vital signs in last 24 hours: Temp:  [97.7 F (36.5 C)-98.8 F (37.1 C)] 97.7 F (36.5 C) (04/30 0839) Pulse Rate:  [79-125] 79 (04/30 0530) Cardiac Rhythm:  [-] Normal sinus rhythm (04/30 0800) Resp:  [16-23] 16 (04/30 0530) BP: (81-112)/(49-79) 103/49 mmHg (04/30 0530) SpO2:  [98 %-100 %] 100 % (04/30 0530)  Hemodynamic parameters for last 24 hours:    Intake/Output from previous day: 04/29 0701 - 04/30 0700 In: 1000 [I.V.:1000] Out: 2545 [Urine:2025; Chest Tube:520] Intake/Output this shift:    General appearance: alert, cooperative and no distress Heart: irregularly irregular rhythm Lungs: coarse Abdomen: benign Extremities: no edema Wound: incis ok  Lab Results:  Recent Labs  04/19/16 0311 04/20/16 0500  WBC 17.1* 9.7  HGB 10.2* 9.2*  HCT 30.7* 27.4*  PLT 156 155   BMET:  Recent Labs  04/19/16 0311 04/20/16 0500  NA 128* 137  K 4.8 4.2  CL 101 109  CO2 21* 22  GLUCOSE 134* 99  BUN 13 13  CREATININE 1.23* 1.09*  CALCIUM 7.4* 7.8*    PT/INR: No results for input(s): LABPROT, INR in the last 72 hours. ABG    Component Value Date/Time   PHART 7.390 04/18/2016 0411   HCO3 21.3 04/18/2016 0411   TCO2 22 04/18/2016 0411   ACIDBASEDEF 3.0* 04/18/2016 0411   O2SAT 92.0 04/18/2016 0411   CBG (last 3)   Recent Labs  04/19/16 1646 04/19/16 2137 04/20/16 0837  GLUCAP 111* 112* 91    Meds Scheduled Meds: . acetaminophen  1,000 mg Oral Q6H   Or  . acetaminophen (TYLENOL) oral  liquid 160 mg/5 mL  1,000 mg Oral Q6H  . antiseptic oral rinse  7 mL Mouth Rinse BID  . aspirin EC  81 mg Oral Daily  . bisacodyl  10 mg Oral Daily  . docusate sodium  100 mg Oral QPM  . enoxaparin (LOVENOX) injection  30 mg Subcutaneous Daily  . insulin aspart  0-9 Units Subcutaneous TID WC  . metoCLOPramide (REGLAN) injection  10 mg Intravenous Q6H  . multivitamin with minerals  1 tablet Oral Daily  . pantoprazole  40 mg Oral Daily  . pravastatin  20 mg Oral QPM  . senna-docusate  1 tablet Oral QHS   Continuous Infusions: . sodium chloride 100 mL/hr (04/19/16 1926)  . bupivacaine ON-Q pain pump     PRN Meds:.ondansetron (ZOFRAN) IV, potassium chloride, traMADol  Xrays Dg Chest Port 1 View  04/19/2016  CLINICAL DATA:  Patient with history of pleural effusion. EXAM: PORTABLE CHEST 1 VIEW COMPARISON:  Chest radiograph 04/18/2016. FINDINGS: Stable cardiac and mediastinal contours. Left IJ central venous catheter tip projects over the superior vena cava. Multiple left-sided chest tubes are stable in position. Low lung volumes. Slight interval worsening bibasilar airspace opacities. Persistent tiny left apical pneumothorax. IMPRESSION: Three left chest tubes remain in place. Unchanged small left apical pneumothorax. Worsening bilateral mid and lower lung airspace opacities favored to represent atelectasis.  Electronically Signed   By: Lovey Newcomer M.D.   On: 04/19/2016 07:50    Assessment/Plan: S/P Procedure(s) (LRB):  Left VIDEO ASSISTED THORACOSCOPY with Drainage of Pleural Effusion, Pleural Biopsy, Visceral Pleural Decortication and Placement of On-Q pain pump (Left)  Left PLEURAL BIOPSY (Left) Visceral Pleural DECORTICATION (Left) DRAINAGE OF Left PLEURAL EFFUSION (Left)  1 afib- will d/w MD 2 H/H - now 9.7/27.4- CT's mod drainage , serosang. 450 out yesterday. Platelets normal. On lovenox/ low dose asa. On protonix - cont to monitor 3 good UO 4 CXR is stable- + air leak- cont  suction 5 sodium improved- decrease IVF rate  LOS: 3 days    Carr,Leslie E 04/20/2016  Patient seen and examined, agree with above She is in rapid atrial fib this AM- will load with amiodarone She is on prophylactic enoxaparin. No indication for full dose anticoagulation at present but if atrial fib persists will ned to consider.  Leslie Standard Roxan Hockey, MD [facility] [phone]

## 2016-04-20 NOTE — Progress Notes (Signed)
  Amiodarone Drug - Drug Interaction Consult Note  Recommendations: Only interaction at this time is with pravastatin - no adjustment required. Monitor for muscle pain and weakness.   Amiodarone is metabolized by the cytochrome P450 system and therefore has the potential to cause many drug interactions. Amiodarone has an average plasma half-life of 50 days (range 20 to 100 days).   There is potential for drug interactions to occur several weeks or months after stopping treatment and the onset of drug interactions may be slow after initiating amiodarone.   [x]  Statins: Increased risk of myopathy. Simvastatin- restrict dose to 20mg  daily. Other statins: counsel patients to report any muscle pain or weakness immediately.  []  Anticoagulants: Amiodarone can increase anticoagulant effect. Consider warfarin dose reduction. Patients should be monitored closely and the dose of anticoagulant altered accordingly, remembering that amiodarone levels take several weeks to stabilize.  []  Antiepileptics: Amiodarone can increase plasma concentration of phenytoin, the dose should be reduced. Note that small changes in phenytoin dose can result in large changes in levels. Monitor patient and counsel on signs of toxicity.  []  Beta blockers: increased risk of bradycardia, AV block and myocardial depression. Sotalol - avoid concomitant use.  []   Calcium channel blockers (diltiazem and verapamil): increased risk of bradycardia, AV block and myocardial depression.  []   Cyclosporine: Amiodarone increases levels of cyclosporine. Reduced dose of cyclosporine is recommended.  []  Digoxin dose should be halved when amiodarone is started.  []  Diuretics: increased risk of cardiotoxicity if hypokalemia occurs.  []  Oral hypoglycemic agents (glyburide, glipizide, glimepiride): increased risk of hypoglycemia. Patient's glucose levels should be monitored closely when initiating amiodarone therapy.   []  Drugs that prolong the  QT interval:  Torsades de pointes risk may be increased with concurrent use - avoid if possible.  Monitor QTc, also keep magnesium/potassium WNL if concurrent therapy can't be avoided. Marland Kitchen Antibiotics: e.g. fluoroquinolones, erythromycin. . Antiarrhythmics: e.g. quinidine, procainamide, disopyramide, sotalol. . Antipsychotics: e.g. phenothiazines, haloperidol.  . Lithium, tricyclic antidepressants, and methadone. Thank You,  Brain Hilts  04/20/2016 10:20 AM

## 2016-04-21 ENCOUNTER — Inpatient Hospital Stay (HOSPITAL_COMMUNITY): Payer: Medicare Other

## 2016-04-21 LAB — GLUCOSE, CAPILLARY
GLUCOSE-CAPILLARY: 108 mg/dL — AB (ref 65–99)
GLUCOSE-CAPILLARY: 88 mg/dL (ref 65–99)
Glucose-Capillary: 132 mg/dL — ABNORMAL HIGH (ref 65–99)
Glucose-Capillary: 82 mg/dL (ref 65–99)

## 2016-04-21 LAB — CBC
HEMATOCRIT: 26 % — AB (ref 36.0–46.0)
HEMOGLOBIN: 8.7 g/dL — AB (ref 12.0–15.0)
MCH: 29.4 pg (ref 26.0–34.0)
MCHC: 33.5 g/dL (ref 30.0–36.0)
MCV: 87.8 fL (ref 78.0–100.0)
Platelets: 186 10*3/uL (ref 150–400)
RBC: 2.96 MIL/uL — ABNORMAL LOW (ref 3.87–5.11)
RDW: 15.2 % (ref 11.5–15.5)
WBC: 8.2 10*3/uL (ref 4.0–10.5)

## 2016-04-21 MED ORDER — METOPROLOL SUCCINATE ER 25 MG PO TB24
12.5000 mg | ORAL_TABLET | Freq: Every day | ORAL | Status: DC
  Administered 2016-04-21 – 2016-04-24 (×4): 12.5 mg via ORAL
  Filled 2016-04-21 (×5): qty 1

## 2016-04-21 MED ORDER — PNEUMOCOCCAL VAC POLYVALENT 25 MCG/0.5ML IJ INJ
0.5000 mL | INJECTION | INTRAMUSCULAR | Status: AC
  Administered 2016-04-24: 0.5 mL via INTRAMUSCULAR
  Filled 2016-04-21 (×2): qty 0.5

## 2016-04-21 MED ORDER — AMIODARONE IV BOLUS ONLY 150 MG/100ML
150.0000 mg | Freq: Once | INTRAVENOUS | Status: AC
  Administered 2016-04-21: 150 mg via INTRAVENOUS
  Filled 2016-04-21: qty 100

## 2016-04-21 NOTE — Care Management Important Message (Signed)
Important Message  Patient Details  Name: Leslie Carr MRN: [id number] Date of Birth: [birth year]   Medicare Important Message Given:  Yes    Nathen May 04/21/2016, 4:09 PM

## 2016-04-21 NOTE — Care Management Note (Signed)
Case Management Note  Patient Details  Name: Leslie Carr MRN: [id number] Date of Birth: 02-26-1931  Subjective/Objective:   Patient  pod4 l vats, drainage of pl. Effusion, decortication, positie air leak, conts on amiodarone for afib, transition to po, chest tube to water seal.  Patient states she will be going to stay with her daughter who is a Therapist, sports at discharge, she has a rolling walker and a bsc at home. NCM will cont to follow for dc needs.               Action/Plan:   Expected Discharge Date:                  Expected Discharge Plan:  Home/Self Care  In-House Referral:     Discharge planning Services  CM Consult  Post Acute Care Choice:    Choice offered to:     DME Arranged:    DME Agency:     HH Arranged:    HH Agency:     Status of Service:  In process, will continue to follow  Medicare Important Message Given:  Yes Date Medicare IM Given:    Medicare IM give by:    Date Additional Medicare IM Given:    Additional Medicare Important Message give by:     If discussed at [place] of Stay Meetings, dates discussed:    Additional Comments:  Zenon Mayo, RN 04/21/2016, 7:22 PM

## 2016-04-21 NOTE — Progress Notes (Addendum)
[address]       [place] [address]             [phone]      4 Days Post-Op Procedure(s) (LRB):  Left VIDEO ASSISTED THORACOSCOPY with Drainage of Pleural Effusion, Pleural Biopsy, Visceral Pleural Decortication and Placement of On-Q pain pump (Left)  Left PLEURAL BIOPSY (Left) Visceral Pleural DECORTICATION (Left) DRAINAGE OF Left PLEURAL EFFUSION (Left) Subjective: Feels fairly well, some soreness, back in sinus rhythm  Objective: Vital signs in last 24 hours: Temp:  [97.7 F (36.5 C)-99 F (37.2 C)] 98.5 F (36.9 C) (05/01 0306) Pulse Rate:  [78-132] 78 (05/01 0310) Cardiac Rhythm:  [-] Normal sinus rhythm (05/01 0310) Resp:  [18-30] 18 (05/01 0310) BP: (92-118)/(47-74) 118/54 mmHg (05/01 0310) SpO2:  [94 %-100 %] 95 % (05/01 0310)  Hemodynamic parameters for last 24 hours:    Intake/Output from previous day: 04/30 0701 - 05/01 0700 In: 593.6 [P.O.:60; I.V.:533.6] Out: 1330 [Urine:1000; Chest Tube:330] Intake/Output this shift:    General appearance: alert, cooperative, fatigued and no distress Heart: regular rate and rhythm Lungs: coarse L>R Abdomen: benign Extremities: no edema Wound: incis healing well  Lab Results:  Recent Labs  04/20/16 0500 04/21/16 0400  WBC 9.7 8.2  HGB 9.2* 8.7*  HCT 27.4* 26.0*  PLT 155 186   BMET:  Recent Labs  04/19/16 0311 04/20/16 0500  NA 128* 137  K 4.8 4.2  CL 101 109  CO2 21* 22  GLUCOSE 134* 99  BUN 13 13  CREATININE 1.23* 1.09*  CALCIUM 7.4* 7.8*    PT/INR: No results for input(s): LABPROT, INR in the last 72 hours. ABG    Component Value Date/Time   PHART 7.390 04/18/2016 0411   HCO3 21.3 04/18/2016 0411   TCO2 22 04/18/2016 0411   ACIDBASEDEF 3.0* 04/18/2016 0411   O2SAT 92.0 04/18/2016 0411   CBG (last 3)   Recent Labs  04/20/16 1140 04/20/16 1657 04/20/16 2106  GLUCAP 107* 110* 110*    Meds Scheduled Meds: . acetaminophen  1,000 mg Oral Q6H   Or  .  acetaminophen (TYLENOL) oral liquid 160 mg/5 mL  1,000 mg Oral Q6H  . antiseptic oral rinse  7 mL Mouth Rinse BID  . aspirin EC  81 mg Oral Daily  . bisacodyl  10 mg Oral Daily  . docusate sodium  100 mg Oral QPM  . enoxaparin (LOVENOX) injection  30 mg Subcutaneous Daily  . insulin aspart  0-9 Units Subcutaneous TID WC  . multivitamin with minerals  1 tablet Oral Daily  . pantoprazole  40 mg Oral Daily  . [START ON 04/22/2016] pneumococcal 23 valent vaccine  0.5 mL Intramuscular Tomorrow-1000  . pravastatin  20 mg Oral QPM  . senna-docusate  1 tablet Oral QHS   Continuous Infusions: . sodium chloride 50 mL/hr (04/20/16 1548)  . amiodarone 30 mg/hr (04/20/16 2212)   PRN Meds:.ondansetron (ZOFRAN) IV, potassium chloride, traMADol  Xrays Dg Chest Port 1 View  04/20/2016  CLINICAL DATA:  82 year old female with history of left-sided chest tube. Left pleural effusion. EXAM: PORTABLE CHEST 1 VIEW COMPARISON:  Chest x-ray 04/19/2016. FINDINGS: There are 3 left-sided chest tubes in place, 2 with tip directed to the left apex and 1 with tip directed in the medial aspect of the left base. Small residual left apical pneumothorax occupying less than 5% of the volume of the left hemithorax. There is a left-sided internal jugular central venous  catheter with tip terminating in the mid superior vena cava. Patchy asymmetrically distributed interstitial and airspace opacities throughout the lungs bilaterally. In addition, there is passive subsegmental atelectasis in the left lower lobe. No right-sided pleural effusion. No evidence of pulmonary edema. Heart size is borderline enlarged. The patient is rotated to the left on today's exam, resulting in distortion of the mediastinal contours and reduced diagnostic sensitivity and specificity for mediastinal pathology. Atherosclerosis in the thoracic aorta. IMPRESSION: 1. Support apparatus, as above. 2. Small left apical pneumothorax appears essentially unchanged  compared to the prior study. 3. Asymmetrically distributed airspace opacities in the lungs bilaterally concerning for mild multilobar bronchopneumonia. 4. Subsegmental atelectasis in the left lower lobe. 5. Atherosclerosis. Electronically Signed   By: Vinnie Langton M.D.   On: 04/20/2016 10:26    Assessment/Plan: S/P Procedure(s) (LRB):  Left VIDEO ASSISTED THORACOSCOPY with Drainage of Pleural Effusion, Pleural Biopsy, Visceral Pleural Decortication and Placement of On-Q pain pump (Left)  Left PLEURAL BIOPSY (Left) Visceral Pleural DECORTICATION (Left) DRAINAGE OF Left PLEURAL EFFUSION (Left)  1 has + air leak, no CXR - will check, may be able to get a tube out, drainage decreasing 2 cont amio, transition to PO over next day 3 H/H is pretty stable, no leukocytosis 4 push rehab/pulm toilet as able   LOS: 4 days    GOLD,WAYNE E 04/21/2016  Patient seen and examined, agree with above She is back in atrial fib currently Drainage down and air leak minimal- will try on water seal- hopefully can start getting tubes out tomorrow  Remo Lipps C. Roxan Hockey, MD [facility] [phone]

## 2016-04-22 ENCOUNTER — Inpatient Hospital Stay (HOSPITAL_COMMUNITY): Payer: Medicare Other

## 2016-04-22 DIAGNOSIS — I639 Cerebral infarction, unspecified: Secondary | ICD-10-CM

## 2016-04-22 DIAGNOSIS — E785 Hyperlipidemia, unspecified: Secondary | ICD-10-CM

## 2016-04-22 DIAGNOSIS — I350 Nonrheumatic aortic (valve) stenosis: Secondary | ICD-10-CM

## 2016-04-22 DIAGNOSIS — I359 Nonrheumatic aortic valve disorder, unspecified: Secondary | ICD-10-CM

## 2016-04-22 DIAGNOSIS — I480 Paroxysmal atrial fibrillation: Secondary | ICD-10-CM

## 2016-04-22 DIAGNOSIS — I63429 Cerebral infarction due to embolism of unspecified anterior cerebral artery: Secondary | ICD-10-CM

## 2016-04-22 LAB — GLUCOSE, CAPILLARY
Glucose-Capillary: 96 mg/dL (ref 65–99)
Glucose-Capillary: 135 mg/dL — ABNORMAL HIGH (ref 65–99)
Glucose-Capillary: 101 mg/dL — ABNORMAL HIGH (ref 65–99)
Glucose-Capillary: 94 mg/dL (ref 65–99)

## 2016-04-22 LAB — CULTURE, BODY FLUID-BOTTLE: CULTURE: NO GROWTH

## 2016-04-22 LAB — TROPONIN I: Troponin I: <0.03 ng/mL (ref <0.031)

## 2016-04-22 LAB — CULTURE, BODY FLUID W GRAM STAIN -BOTTLE

## 2016-04-22 MED ORDER — ROSUVASTATIN CALCIUM 20 MG PO TABS
20.0000 mg | ORAL_TABLET | Freq: Every day | ORAL | Status: DC
  Administered 2016-04-22 – 2016-04-24 (×3): 20 mg via ORAL
  Filled 2016-04-22 (×3): qty 1

## 2016-04-22 MED ORDER — AMIODARONE HCL 200 MG PO TABS
400.0000 mg | ORAL_TABLET | Freq: Two times a day (BID) | ORAL | Status: DC
  Administered 2016-04-22 – 2016-04-24 (×6): 400 mg via ORAL
  Filled 2016-04-22 (×6): qty 2

## 2016-04-22 MED ORDER — FERROUS SULFATE 325 (65 FE) MG PO TABS
325.0000 mg | ORAL_TABLET | Freq: Three times a day (TID) | ORAL | Status: DC
  Administered 2016-04-22 – 2016-04-23 (×4): 325 mg via ORAL
  Filled 2016-04-22 (×5): qty 1

## 2016-04-22 MED ORDER — HYDROCODONE-HOMATROPINE 5-1.5 MG/5ML PO SYRP
5.0000 mL | ORAL_SOLUTION | Freq: Four times a day (QID) | ORAL | Status: DC | PRN
  Administered 2016-04-22 – 2016-04-25 (×4): 5 mL via ORAL
  Filled 2016-04-22 (×4): qty 5

## 2016-04-22 MED ORDER — BENZONATATE 100 MG PO CAPS
200.0000 mg | ORAL_CAPSULE | Freq: Three times a day (TID) | ORAL | Status: DC | PRN
  Administered 2016-04-22: 200 mg via ORAL
  Filled 2016-04-22: qty 2

## 2016-04-22 MED ORDER — MORPHINE SULFATE (PF) 2 MG/ML IV SOLN
2.0000 mg | INTRAVENOUS | Status: DC | PRN
  Administered 2016-04-22 – 2016-04-24 (×3): 2 mg via INTRAVENOUS
  Filled 2016-04-22 (×3): qty 1

## 2016-04-22 NOTE — Consult Note (Addendum)
Name: Leslie Carr is a 82 y.o. female Admit date: 04/17/2016 Referring Physician:  Dr. Roxan Hockey Primary Physician:  Leslie Shelter, PA-C Primary Cardiologist:  none  Reason for Consultation:  Atrial fibrillation w/ RVR  ASSESSMENT: 82 y/o F w/ PMHx of CAD, dCHF, breast cancer, and recurrent left pleural effusions who went into afib 1 day after left lung pleural decortication on 4/27. Pt has a hx of a stroke with unclear cause that could be reasonably assumed to be due to embolic source from afib as she endorses 5 year hx of intermittent chest palpitations that feel the same as the palpitations she experiences during this hospitalization when she is noted to be in afib w/ RVR. She has been in and out of afib since after her procedure 5 days ago which could be the result the procedure as a pulmonary etiology can cause afib, also her age itself is a risk factor for pAFib. Other etiologies could be pericarditis but she denies pleuritic chest pain and EKG is not suggestive of this, could also be structural heart disease although ECHO last month was not significant for left atrial dilation, she has a LUSB systolic murmur consistent with aortic stenosis however her aortic valve gradient is not clinically significant. Likely patient has had underlying paroxysmal afib with a CHA2Ds- VASc score of 7 that warrants lifelong anticoagulation.   PLAN: 1. Paroxysmal atrial fibrillation-- This patients CHA2DS2-VASc Score and unadjusted Ischemic Stroke Rate (% per year) is equal to 11.2 % stroke rate/year from a score of 7 (CHF, Vascular=MI/PAD/Aortic Plaque, Age if 65-74, Female,Age > 75, and Stroke). She was started on max dose of po amiodarone 400mg  BID and metoprolol 12.5mg  qd for afib. Diltiazem is contraindicated for afib in this patient w/ CHF. Can consider increasing pt's BB for better rate control however her BP are low normal. Can also consider starting digoxin if afib does not resolve after improvement  of lungs from recent procedure 5 days ago.  - start on AC once stable as per history likely pt has had episodes of afib in the past, along w/ hx stroke that was probably due to embolic source, per primary - consider outpatient ECHO - f/u with cardiology outpatient to wean amiodarone as needed  2. Two vessel coronary artery disease--noted on CT chest on 03/04/16, asymptomatic - checking a troponin level. - risk modification -- changing statin to crestor 20mg , will monitor for side effects as she could not tolerate lipitor.  3. Diastolic congestive heart failure-- EF 48% w/ grade 1 diastolic dysfunction, LVH, diffuse hypokinesis.  - can consider ACEinh once creatinine returns to baseline - continue BB as above  4. Acute blood loss anemia-- s/p 4 units RBC transfusion - started on iron 325mg  TID w/ meals   HPI: [name] is an 82 year old female with past medical history of breast cancer s/p lt mastectomy in [id number], systolic and diastolic CHF, CVA w/ residual left sided weakness, and recurrent lt pleural effusions who is now day 5 s/p left visceral pleural decortication. Cardiology was consulted for new atrial fibrillation with rapid ventricular rate of 120 that developed one day after her pleural decortication procedure. She was started on amiodarone with intermittent restoration of NSR. Today she was changed from IV amiodarone to po 400mg  BID. She is also started on metoprolol XL 12.5mg  this admission. Her TSH level was nl. Her EKG today confirmed atrial fib w/ RVR, however she was in NSR on exam. She endorses intermittent chest palpitations that  feel like the palpitations she experiences here in the hospital during episodes of afib w/ RVR that she has noticed the past 5 years and they are associated w/ feelings of anxiety. She has a hx of a stroke with residual left sided weakness 3 years ago, she reports the source of the stroke was due to stress of her son's death. These records are not in EPIC.  After her left pleural decortication procedure 5 days ago she received 4 units of RBC transfusion due to acute blood loss from the procedure, her hemoglobin is 8.7 today and she denies melena, hematochezia, SOB, and chest pain. Her only pain is from her surgery incision site. She has had increased coughing, mainly at night due to a dry through, denies congestion or sputum production.   PMH:   Past Medical History  Diagnosis Date  . Breast cancer ([place])   . CHF (congestive heart failure) ([place])   . Heart murmur   . Hypercholesteremia   . Stroke [facility]) 2014    left sided weakness - mainly left hand  . Pleural effusion on left   . Shortness of breath dyspnea     "only going up a flight of stairs"  . History of bronchitis   . Numbness and tingling in left hand     PSH:   Past Surgical History  Procedure Laterality Date  . Abdominal hysterectomy  1976  . Mastectomy Left 1985  . Foot surgery Right     "bone spur removal"  . Cataract extraction Right   . Colonoscopy    . Tonsillectomy    . Video assisted thoracoscopy Left 04/17/2016    Procedure:  Left VIDEO ASSISTED THORACOSCOPY with Drainage of Pleural Effusion, Pleural Biopsy, Visceral Pleural Decortication and Placement of On-Q pain pump;  Surgeon: Melrose Nakayama, MD;  Location: [place];  Service: Thoracic;  Laterality: Left;  . Pleural biopsy Left 04/17/2016    Procedure:  Left PLEURAL BIOPSY;  Surgeon: Melrose Nakayama, MD;  Location: [place];  Service: Thoracic;  Laterality: Left;  . Decortication Left 04/17/2016    Procedure: Visceral Pleural DECORTICATION;  Surgeon: Melrose Nakayama, MD;  Location: [place];  Service: Thoracic;  Laterality: Left;  . Pleural effusion drainage Left 04/17/2016    Procedure: DRAINAGE OF Left PLEURAL EFFUSION;  Surgeon: Melrose Nakayama, MD;  Location: [place];  Service: Thoracic;  Laterality: Left;   Allergies:  Lipitor Prior to Admit Meds:   Prescriptions prior to admission  Medication Sig  Dispense Refill Last Dose  . aspirin 81 MG tablet Take 81 mg by mouth every evening.    04/16/2016 at Unknown time  . docusate sodium (COLACE) 100 MG capsule Take 100 mg by mouth every evening.   04/16/2016 at Unknown time  . Multiple Vitamins-Minerals (MULTIVITAMIN WITH MINERALS) tablet Take 1 tablet by mouth daily.   04/16/2016 at Unknown time  . pravastatin (PRAVACHOL) 20 MG tablet Take 20 mg by mouth every evening.    04/16/2016 at Unknown time   Fam HX:   History reviewed. No pertinent family history. Social HX:    Social History   Social History  . Marital Status: Divorced    Spouse Name: N/A  . Number of Children: N/A  . Years of Education: N/A   Occupational History  . Not on file.   Social History Main Topics  . Smoking status: Former Research scientist (life sciences)  . Smokeless tobacco: Never Used     Comment: Quit over 40 years ago  .  Alcohol Use: No  . Drug Use: No  . Sexual Activity: Not on file   Other Topics Concern  . Not on file   Social History Narrative     Review of Systems: Pertinent ROS noted in HPI. All other systems are negative.  Physical Exam: Blood pressure 106/59, pulse 107, temperature 98.8 F (37.1 C), temperature source Oral, resp. rate 20, height 5\' 1"  (1.549 m), weight 135 lb 9.3 oz (61.5 kg), SpO2 96 %. Weight change:   Physical Exam  Constitutional: She appears well-developed and well-nourished. No distress.  HENT:  Head: Normocephalic and atraumatic.  Nose: Nose normal.  Eyes: Conjunctivae and EOM are normal. No scleral icterus.  Cardiovascular: Normal rate and regular rhythm.  Exam reveals no gallop and no friction rub.   Murmur (systolic murmur at LUSB) heard. Pulmonary/Chest: Effort normal. No respiratory distress (LLL). She has no wheezes. She has rales.  Abdominal: Soft. Bowel sounds are normal. She exhibits no distension and no mass. There is no tenderness. There is no rebound and no guarding.  Skin: Skin is warm and dry. No rash noted. She is not  diaphoretic. No erythema. No pallor.    Labs: Lab Results  Component Value Date   WBC 8.2 04/21/2016   HGB 8.7* 04/21/2016   HCT 26.0* 04/21/2016   MCV 87.8 04/21/2016   PLT 186 04/21/2016    Recent Labs Lab 04/19/16 0311 04/20/16 0500  NA 128* 137  K 4.8 4.2  CL 101 109  CO2 21* 22  BUN 13 13  CREATININE 1.23* 1.09*  CALCIUM 7.4* 7.8*  PROT 4.2*  --   BILITOT 0.5  --   ALKPHOS 42  --   ALT 14  --   AST 24  --   GLUCOSE 134* 99   No results found for: PTT Lab Results  Component Value Date   INR 1.02 04/15/2016      Radiology:  Dg Chest Port 1 View  04/22/2016  CLINICAL DATA:  Pleural effusion. EXAM: PORTABLE CHEST 1 VIEW COMPARISON:  Apr 21, 2016. FINDINGS: Stable cardiomegaly. Three left-sided chest tubes are again noted and unchanged in position. Stable mild left apical pneumothorax is noted. Stable bibasilar atelectasis is noted. Left internal jugular catheter is unchanged in position. IMPRESSION: Three left-sided chest tubes are unchanged in position. Stable mild left apical pneumothorax. Electronically Signed   By: Marijo Conception, M.D.   On: 04/22/2016 07:52   Dg Chest Port 1 View  04/21/2016  CLINICAL DATA:  Pleural effusion.  Evaluate chest tube. EXAM: PORTABLE CHEST 1 VIEW COMPARISON:  04/20/2016 FINDINGS: There is a left IJ catheter with tip in the projection of the SVC. The heart size is mildly enlarged. Aortic atherosclerosis noted. There are 3 left chest tubes. The small left apical pneumothorax appears decreased in volume from previous exam. Atelectasis within both lung bases noted. IMPRESSION: 1. Decrease in size of small left apical pneumothorax. 2. No new findings. Electronically Signed   By: Kerby Moors M.D.   On: 04/21/2016 09:32    EKG:  afib w/ RVR, VR 101    Julious Oka 04/22/2016 9:35 AM   The patient has been seen in conjunction with Julious Oka, MD. All aspects of care have been considered and discussed. The patient has been personally  interviewed, examined, and all clinical data has been reviewed.   Suspect h/o PAF over several years, finally documented during this admission. Prior h/o CVA x 2 of unknown etiology. She has a several  year h/o palpitations similar to those present during AF identified on this admission.  Needs Amiodarone to suppress AF recurrence and chronic life-long anticoagulation going forward given extremely high CHADS-VASC.  Start anticoagulation when safe from surgical standpoint. She wants to avoid coumadin.

## 2016-04-22 NOTE — Progress Notes (Signed)
5 Days Post-Op Procedure(s) (LRB):  Left VIDEO ASSISTED THORACOSCOPY with Drainage of Pleural Effusion, Pleural Biopsy, Visceral Pleural Decortication and Placement of On-Q pain pump (Left)  Left PLEURAL BIOPSY (Left) Visceral Pleural DECORTICATION (Left) DRAINAGE OF Left PLEURAL EFFUSION (Left) Subjective Having more pain this AM- had severe coughing spell last night  Objective: Vital signs in last 24 hours: Temp:  [97.5 F (36.4 C)-98.8 F (37.1 C)] 98.8 F (37.1 C) (05/02 0406) Pulse Rate:  [69-118] 69 (05/02 0406) Cardiac Rhythm:  [-] Normal sinus rhythm (05/02 0406) Resp:  [20-23] 22 (05/02 0406) BP: (94-139)/(52-71) 126/52 mmHg (05/02 0406) SpO2:  [96 %-100 %] 100 % (05/02 0406)  Hemodynamic parameters for last 24 hours:    Intake/Output from previous day: 05/01 0701 - 05/02 0700 In: 1200.6 [I.V.:1200.6] Out: 1060 [Urine:700; Chest Tube:360] Intake/Output this shift:    General appearance: alert, cooperative and no distress Neurologic: intact Heart: slightly irregular Lungs: clear to auscultation bilaterally no air leak  Lab Results:  Recent Labs  04/20/16 0500 04/21/16 0400  WBC 9.7 8.2  HGB 9.2* 8.7*  HCT 27.4* 26.0*  PLT 155 186   BMET:  Recent Labs  04/20/16 0500  NA 137  K 4.2  CL 109  CO2 22  GLUCOSE 99  BUN 13  CREATININE 1.09*  CALCIUM 7.8*    PT/INR: No results for input(s): LABPROT, INR in the last 72 hours. ABG    Component Value Date/Time   PHART 7.390 04/18/2016 0411   HCO3 21.3 04/18/2016 0411   TCO2 22 04/18/2016 0411   ACIDBASEDEF 3.0* 04/18/2016 0411   O2SAT 92.0 04/18/2016 0411   CBG (last 3)   Recent Labs  04/21/16 1129 04/21/16 1715 04/21/16 2102  GLUCAP 82 108* 88    Assessment/Plan: S/P Procedure(s) (LRB):  Left VIDEO ASSISTED THORACOSCOPY with Drainage of Pleural Effusion, Pleural Biopsy, Visceral Pleural Decortication and Placement of On-Q pain pump (Left)  Left PLEURAL BIOPSY (Left) Visceral Pleural  DECORTICATION (Left) DRAINAGE OF Left PLEURAL EFFUSION (Left) -  CV- in SR with PACs this AM  Change amiodarone to PO  RESP- no air leak, still with moderate drainage from CT  DC anterior CT  Continue IS  RENAL- no issues  ENDO- CBG well controlled  DVT prophylaxis- SCD + enoxaparin   LOS: 5 days    Melrose Nakayama 04/22/2016

## 2016-04-23 ENCOUNTER — Inpatient Hospital Stay (HOSPITAL_COMMUNITY): Payer: Medicare Other

## 2016-04-23 DIAGNOSIS — I639 Cerebral infarction, unspecified: Secondary | ICD-10-CM

## 2016-04-23 LAB — GLUCOSE, CAPILLARY
GLUCOSE-CAPILLARY: 86 mg/dL (ref 65–99)
GLUCOSE-CAPILLARY: 112 mg/dL — AB (ref 65–99)
Glucose-Capillary: 83 mg/dL (ref 65–99)
Glucose-Capillary: 117 mg/dL — ABNORMAL HIGH (ref 65–99)

## 2016-04-23 MED ORDER — RIVAROXABAN 15 MG PO TABS
15.0000 mg | ORAL_TABLET | Freq: Every day | ORAL | Status: DC
  Administered 2016-04-23 – 2016-04-24 (×2): 15 mg via ORAL
  Filled 2016-04-23 (×2): qty 1

## 2016-04-23 NOTE — Progress Notes (Signed)
[address]       [place] [address]             [phone]      6 Days Post-Op Procedure(s) (LRB):  Left VIDEO ASSISTED THORACOSCOPY with Drainage of Pleural Effusion, Pleural Biopsy, Visceral Pleural Decortication and Placement of On-Q pain pump (Left)  Left PLEURAL BIOPSY (Left) Visceral Pleural DECORTICATION (Left) DRAINAGE OF Left PLEURAL EFFUSION (Left) Subjective: conts to feel better  Objective: Vital signs in last 24 hours: Temp:  [97.7 F (36.5 C)-99.2 F (37.3 C)] 98.8 F (37.1 C) (05/03 0324) Pulse Rate:  [78-137] 78 (05/03 0735) Cardiac Rhythm:  [-] Heart block (05/02 2002) Resp:  [20-29] 23 (05/03 0735) BP: (101-146)/(52-74) 135/65 mmHg (05/03 0735) SpO2:  [91 %-100 %] 100 % (05/03 0735)  Hemodynamic parameters for last 24 hours:    Intake/Output from previous day: 05/02 0701 - 05/03 0700 In: 1460 [P.O.:660; I.V.:800] Out: 900 [Urine:700; Chest Tube:200] Intake/Output this shift:    General appearance: alert, cooperative and no distress Heart: regular rate and rhythm Lungs: dim in left base Abdomen: benign Extremities: no edema Wound: incis healing well  Lab Results:  Recent Labs  04/21/16 0400  WBC 8.2  HGB 8.7*  HCT 26.0*  PLT 186   BMET: No results for input(s): NA, K, CL, CO2, GLUCOSE, BUN, CREATININE, CALCIUM in the last 72 hours.  PT/INR: No results for input(s): LABPROT, INR in the last 72 hours. ABG    Component Value Date/Time   PHART 7.390 04/18/2016 0411   HCO3 21.3 04/18/2016 0411   TCO2 22 04/18/2016 0411   ACIDBASEDEF 3.0* 04/18/2016 0411   O2SAT 92.0 04/18/2016 0411   CBG (last 3)   Recent Labs  04/22/16 1216 04/22/16 1644 04/22/16 2118  GLUCAP 135* 101* 94    Meds Scheduled Meds: . amiodarone  400 mg Oral BID  . antiseptic oral rinse  7 mL Mouth Rinse BID  . aspirin EC  81 mg Oral Daily  . bisacodyl  10 mg Oral Daily  . docusate sodium  100 mg Oral QPM  . enoxaparin (LOVENOX)  injection  30 mg Subcutaneous Daily  . ferrous sulfate  325 mg Oral TID WC  . insulin aspart  0-9 Units Subcutaneous TID WC  . metoprolol succinate  12.5 mg Oral Daily  . multivitamin with minerals  1 tablet Oral Daily  . pantoprazole  40 mg Oral Daily  . pneumococcal 23 valent vaccine  0.5 mL Intramuscular Tomorrow-1000  . rosuvastatin  20 mg Oral q1800  . senna-docusate  1 tablet Oral QHS   Continuous Infusions: . sodium chloride 50 mL/hr at 04/22/16 1600   PRN Meds:.benzonatate, HYDROcodone-homatropine, morphine injection, ondansetron (ZOFRAN) IV, potassium chloride, traMADol  Xrays Dg Chest Port 1 View  04/23/2016  CLINICAL DATA:  History of pleural effusion, followup, history of breast carcinoma EXAM: PORTABLE CHEST 1 VIEW COMPARISON:  Portable chest x-ray of 04/22/2016 and two-view chest x-ray of 04/15/2016 FINDINGS: The lungs are not as well aerated with and increase in bibasilar opacities consistent with atelectasis and effusions. A left chest tube remains and no definite left pneumothorax is seen. A nodular opacity remains at the right lung base. Left central venous line is unchanged. Cardiomegaly is stable. IMPRESSION: 1. Diminished aeration with increase in bibasilar atelectasis and effusions. 2. Left chest tube remains.  No definite left pneumothorax is seen. Electronically Signed   By: Ivar Drape M.D.   On: 04/23/2016 08:07  Dg Chest Port 1 View  04/22/2016  CLINICAL DATA:  Pleural effusion. EXAM: PORTABLE CHEST 1 VIEW COMPARISON:  Apr 21, 2016. FINDINGS: Stable cardiomegaly. Three left-sided chest tubes are again noted and unchanged in position. Stable mild left apical pneumothorax is noted. Stable bibasilar atelectasis is noted. Left internal jugular catheter is unchanged in position. IMPRESSION: Three left-sided chest tubes are unchanged in position. Stable mild left apical pneumothorax. Electronically Signed   By: Marijo Conception, M.D.   On: 04/22/2016 07:52   Dg Chest Port 1  View  04/21/2016  CLINICAL DATA:  Pleural effusion.  Evaluate chest tube. EXAM: PORTABLE CHEST 1 VIEW COMPARISON:  04/20/2016 FINDINGS: There is a left IJ catheter with tip in the projection of the SVC. The heart size is mildly enlarged. Aortic atherosclerosis noted. There are 3 left chest tubes. The small left apical pneumothorax appears decreased in volume from previous exam. Atelectasis within both lung bases noted. IMPRESSION: 1. Decrease in size of small left apical pneumothorax. 2. No new findings. Electronically Signed   By: Kerby Moors M.D.   On: 04/21/2016 09:32    Assessment/Plan: S/P Procedure(s) (LRB):  Left VIDEO ASSISTED THORACOSCOPY with Drainage of Pleural Effusion, Pleural Biopsy, Visceral Pleural Decortication and Placement of On-Q pain pump (Left)  Left PLEURAL BIOPSY (Left) Visceral Pleural DECORTICATION (Left) DRAINAGE OF Left PLEURAL EFFUSION (Left)  1 doing well 2 CT no air leak, drainage conts to decrease 3 prob can d/c another tube today 4 cardiology recs coumadin for high chads-vasc score (7), when safe from surgical viewpoint. She is maintaining sinus rhythm- start xarelto  LOS: 6 days    GOLD,WAYNE E 04/23/2016

## 2016-04-23 NOTE — Progress Notes (Signed)
ANTICOAGULATION CONSULT NOTE - Initial Consult  Pharmacy Consult for Xarelto Indication: atrial fibrillation  Allergies  Allergen Reactions  . Lipitor [Atorvastatin] Other (See Comments)    Dizzy and make her head feel huge     Patient Measurements: Height: 5\' 1"  (154.9 cm) Weight: 135 lb 9.3 oz (61.5 kg) IBW/kg (Calculated) : 47.8  Vital Signs: Temp: 98.8 F (37.1 C) (05/03 0324) Temp Source: Oral (05/03 0324) BP: 135/65 mmHg (05/03 0735) Pulse Rate: 78 (05/03 0735)  Labs:  Recent Labs  04/21/16 0400 04/22/16 1425  HGB 8.7*  --   HCT 26.0*  --   PLT 186  --   TROPONINI  --  <0.03    Estimated Creatinine Clearance: 33.5 mL/min (by C-G formula based on Cr of 1.09).   Medical History: Past Medical History  Diagnosis Date  . Breast cancer ([place])   . CHF (congestive heart failure) ([place])   . Heart murmur   . Hypercholesteremia   . Stroke [facility]) 2014    left sided weakness - mainly left hand  . Pleural effusion on left   . Shortness of breath dyspnea     "only going up a flight of stairs"  . History of bronchitis   . Numbness and tingling in left hand     Assessment: 82 yo F presents on 4/19 with pleural effusion. Found to have Aifb. CHA2DS2-VASc = 7. Pharmacy consulted to start Xarelto. Got last enoxaparin dose at 0800 this am. Hgb low at 8.7, plts wnl. No s/s of bleed. SCr down to 1.09, CrCl ~38ml/min.   Goal of Therapy:  Monitor platelets by anticoagulation protocol: Yes   Plan:  Start Xarelto 15mg  PO with supper Monitor CBC, s/s of bleed   Rilynne Lonsway J 04/23/2016,8:47 AM

## 2016-04-23 NOTE — Progress Notes (Signed)
Patient Name: Leslie Carr Date of Encounter: 04/23/2016    SUBJECTIVE:Pt only complaints are regarding sx incision site, otherwise doing well.   TELEMETRY:  NSR Filed Vitals:   04/22/16 2310 04/23/16 0322 04/23/16 0324 04/23/16 0735  BP: 138/74 135/57  135/65  Pulse: 91 89  78  Temp:   98.8 F (37.1 C)   TempSrc:   Oral   Resp: 25 29  23   Height:      Weight:      SpO2: 98% 99%  100%    Intake/Output Summary (Last 24 hours) at 04/23/16 0842 Last data filed at 04/23/16 0400  Gross per 24 hour  Intake   1060 ml  Output    550 ml  Net    510 ml   LABS: Basic Metabolic Panel: No results for input(s): NA, K, CL, CO2, GLUCOSE, BUN, CREATININE, CALCIUM, MG, PHOS in the last 72 hours. CBC:  Recent Labs  04/21/16 0400  WBC 8.2  HGB 8.7*  HCT 26.0*  MCV 87.8  PLT 186   Cardiac Enzymes:  Recent Labs  04/22/16 1425  TROPONINI <0.03    Physical Exam: Blood pressure 135/65, pulse 78, temperature 98.8 F (37.1 C), temperature source Oral, resp. rate 23, height 5\' 1"  (1.549 m), weight 135 lb 9.3 oz (61.5 kg), SpO2 100 %. Weight change:   Wt Readings from Last 3 Encounters:  04/18/16 135 lb 9.3 oz (61.5 kg)  04/15/16 133 lb 8 oz (60.555 kg)  04/09/16 134 lb (60.782 kg)    Physical Exam  Constitutional: She appears well-developed and well-nourished. No distress.  HENT:  Head: Normocephalic and atraumatic.  Nose: Nose normal.  Eyes: Conjunctivae and EOM are normal. No scleral icterus.  Cardiovascular: Normal rate and regular rhythm. Exam reveals no gallop and no friction rub.  Murmur (systolic murmur at LUSB) heard. Pulmonary/Chest: Effort normal. No respiratory distress. Crackles at LLL. She has no wheezes.  Abdominal: Soft. Bowel sounds are normal. She exhibits no distension and no mass. There is no tenderness. There is no rebound and no guarding.  Skin: Skin is warm and dry. No rash noted. She is not diaphoretic. No erythema. No pallor.    ASSESSMENT: 82 y/o F w/ PMHx of CAD, dCHF, breast cancer, and recurrent left pleural effusions who went into afib 1 day after left lung pleural decortication on 4/27. She is currently in NSR, likely pt has paroxysmal afib that was subsequently captured on tele during admission.  PLAN: 1. Paroxysmal atrial fibrillation-- This patients CHA2DS2-VASc Score and unadjusted Ischemic Stroke Rate (% per year) is equal to 11.2 % stroke rate/year from a score of 7 (CHF, Vascular=MI/PAD/Aortic Plaque, Age if 65-74, Female,Age > 75, and Stroke). She was started on max dose of po amiodarone 400mg  BID and metoprolol 12.5mg  qd for afib. Diltiazem is contraindicated for afib in this patient w/ CHF. Can consider increasing pt's BB for better rate control however her BP are low normal. Can also consider starting digoxin if afib does not resolve after improvement of lungs from recent procedure. - xarelto 20mg  qd - consider outpatient ECHO - f/u with cardiology outpatient to wean amiodarone as needed  2. Two vessel coronary artery disease--noted on CT chest on 03/04/16, asymptomatic. - crestor 20mg   3. Diastolic congestive heart failure-- EF 48% w/ grade 1 diastolic dysfunction, LVH, diffuse hypokinesis.  - can consider ACEinh once creatinine returns to baseline - continue BB as above  4. Acute blood loss anemia--  s/p 4 units RBC transfusion - started on iron 325mg  TID w/ meals   Signed, Julious Oka 04/23/2016, 8:42 AM The patient has been seen in conjunction with Julious Oka, M.D. All aspects of care have been considered and discussed. The patient has been personally interviewed, examined, and all clinical data has been reviewed.   Agree with note as dictated above.

## 2016-04-23 NOTE — Progress Notes (Signed)
Nurse attempted to ambulate pt today and pt refused stated that she is in too much pain. Pain medication has been given to pt, but is still reluctant to get up and move. States that she is tired and in pain.

## 2016-04-23 NOTE — Progress Notes (Signed)
CCMD notified pt had 2.76 pause at 0032. Pt is asymptomatic and resting comfortably.

## 2016-04-23 NOTE — Progress Notes (Signed)
Patient's anterior chest tube removed per MD order. Patient tolerated well and reports she feels better knowing that she is down one more tube.

## 2016-04-24 ENCOUNTER — Inpatient Hospital Stay (HOSPITAL_COMMUNITY): Payer: Medicare Other

## 2016-04-24 LAB — BASIC METABOLIC PANEL
Anion gap: 7 (ref 5–15)
BUN: 12 mg/dL (ref 6–20)
CHLORIDE: 105 mmol/L (ref 101–111)
CO2: 24 mmol/L (ref 22–32)
Calcium: 7.5 mg/dL — ABNORMAL LOW (ref 8.9–10.3)
Creatinine, Ser: 0.92 mg/dL (ref 0.44–1.00)
GFR calc Af Amer: >60 mL/min (ref >60)
GFR calc non Af Amer: 56 mL/min — ABNORMAL LOW (ref >60)
Glucose, Bld: 103 mg/dL — ABNORMAL HIGH (ref 65–99)
POTASSIUM: 3.8 mmol/L (ref 3.5–5.1)
SODIUM: 136 mmol/L (ref 135–145)

## 2016-04-24 LAB — GLUCOSE, CAPILLARY
GLUCOSE-CAPILLARY: 86 mg/dL (ref 65–99)
GLUCOSE-CAPILLARY: 113 mg/dL — AB (ref 65–99)
Glucose-Capillary: 101 mg/dL — ABNORMAL HIGH (ref 65–99)
Glucose-Capillary: 133 mg/dL — ABNORMAL HIGH (ref 65–99)

## 2016-04-24 LAB — CBC
HEMATOCRIT: 27.2 % — AB (ref 36.0–46.0)
HEMOGLOBIN: 8.9 g/dL — AB (ref 12.0–15.0)
MCH: 29.4 pg (ref 26.0–34.0)
MCHC: 32.7 g/dL (ref 30.0–36.0)
MCV: 89.8 fL (ref 78.0–100.0)
Platelets: 370 10*3/uL (ref 150–400)
RBC: 3.03 MIL/uL — AB (ref 3.87–5.11)
RDW: 15.4 % (ref 11.5–15.5)
WBC: 9.2 10*3/uL (ref 4.0–10.5)

## 2016-04-24 MED ORDER — LISINOPRIL 10 MG PO TABS
10.0000 mg | ORAL_TABLET | Freq: Every day | ORAL | Status: DC
  Administered 2016-04-24: 10 mg via ORAL
  Filled 2016-04-24 (×2): qty 1

## 2016-04-24 NOTE — Progress Notes (Addendum)
[address]       [place] [address]             [phone]      7 Days Post-Op Procedure(s) (LRB):  Left VIDEO ASSISTED THORACOSCOPY with Drainage of Pleural Effusion, Pleural Biopsy, Visceral Pleural Decortication and Placement of On-Q pain pump (Left)  Left PLEURAL BIOPSY (Left) Visceral Pleural DECORTICATION (Left) DRAINAGE OF Left PLEURAL EFFUSION (Left) Subjective: Increased nausea, "most of night"  Objective: Vital signs in last 24 hours: Temp:  [97.6 F (36.4 C)-98.5 F (36.9 C)] 97.6 F (36.4 C) (05/04 0420) Pulse Rate:  [72-94] 72 (05/04 0700) Cardiac Rhythm:  [-] Normal sinus rhythm (05/04 0701) Resp:  [19-25] 21 (05/04 0700) BP: (105-129)/(49-60) 120/58 mmHg (05/04 0700) SpO2:  [82 %-96 %] 96 % (05/04 0700)  Hemodynamic parameters for last 24 hours:    Intake/Output from previous day: 05/03 0701 - 05/04 0700 In: 120 [P.O.:120] Out: 270 [Urine:200; Chest Tube:70] Intake/Output this shift:    General appearance: alert, cooperative and no distress Heart: regular rate and rhythm Lungs: coarse and dim on left Abdomen: benign Extremities: L arm/hand more swollen today, some left lower ext edema Wound: incis healing well  Lab Results:  Recent Labs  04/24/16 0415  WBC 9.2  HGB 8.9*  HCT 27.2*  PLT 370   BMET:  Recent Labs  04/24/16 0415  NA 136  K 3.8  CL 105  CO2 24  GLUCOSE 103*  BUN 12  CREATININE 0.92  CALCIUM 7.5*    PT/INR: No results for input(s): LABPROT, INR in the last 72 hours. ABG    Component Value Date/Time   PHART 7.390 04/18/2016 0411   HCO3 21.3 04/18/2016 0411   TCO2 22 04/18/2016 0411   ACIDBASEDEF 3.0* 04/18/2016 0411   O2SAT 92.0 04/18/2016 0411   CBG (last 3)   Recent Labs  04/23/16 1241 04/23/16 1655 04/23/16 2106  GLUCAP 83 112* 117*    Meds Scheduled Meds: . amiodarone  400 mg Oral BID  . antiseptic oral rinse  7 mL Mouth Rinse BID  . aspirin EC  81 mg Oral Daily  .  bisacodyl  10 mg Oral Daily  . docusate sodium  100 mg Oral QPM  . ferrous sulfate  325 mg Oral TID WC  . insulin aspart  0-9 Units Subcutaneous TID WC  . metoprolol succinate  12.5 mg Oral Daily  . multivitamin with minerals  1 tablet Oral Daily  . pantoprazole  40 mg Oral Daily  . pneumococcal 23 valent vaccine  0.5 mL Intramuscular Tomorrow-1000  . rivaroxaban  15 mg Oral Q supper  . rosuvastatin  20 mg Oral q1800  . senna-docusate  1 tablet Oral QHS   Continuous Infusions: . sodium chloride 50 mL/hr at 04/22/16 1600   PRN Meds:.benzonatate, HYDROcodone-homatropine, morphine injection, ondansetron (ZOFRAN) IV, potassium chloride, traMADol  Xrays Dg Chest Port 1 View  04/23/2016  CLINICAL DATA:  History of pleural effusion, followup, history of breast carcinoma EXAM: PORTABLE CHEST 1 VIEW COMPARISON:  Portable chest x-ray of 04/22/2016 and two-view chest x-ray of 04/15/2016 FINDINGS: The lungs are not as well aerated with and increase in bibasilar opacities consistent with atelectasis and effusions. A left chest tube remains and no definite left pneumothorax is seen. A nodular opacity remains at the right lung base. Left central venous line is unchanged. Cardiomegaly is stable. IMPRESSION: 1. Diminished aeration with increase in bibasilar atelectasis and effusions. 2. Left chest  tube remains.  No definite left pneumothorax is seen. Electronically Signed   By: Ivar Drape M.D.   On: 04/23/2016 08:07    Assessment/Plan: S/P Procedure(s) (LRB):  Left VIDEO ASSISTED THORACOSCOPY with Drainage of Pleural Effusion, Pleural Biopsy, Visceral Pleural Decortication and Placement of On-Q pain pump (Left)  Left PLEURAL BIOPSY (Left) Visceral Pleural DECORTICATION (Left) DRAINAGE OF Left PLEURAL EFFUSION (Left)   1 steady progress 2 check CXR, may be able to d/c last tube today- no air leak 3 increased swelling left arm, could potentially have upper ext DVT, cont rivaroxiban 4 mult meds could be  contrib to nausea- she is not taking Iron now 5 SR , hemodyn stable  LOS: 7 days    GOLD,WAYNE E 04/24/2016  Patient seen and examined, agree with above Dc chest tube Possibly home tomorrow   Remo Lipps C. Roxan Hockey, MD [facility] [phone]

## 2016-04-24 NOTE — Progress Notes (Signed)
Last chest tube removed per MD order. Patient tolerated the procedure well. No problems or complications.

## 2016-04-24 NOTE — Care Management Important Message (Signed)
Important Message  Patient Details  Name: Leslie Carr MRN: [id number] Date of Birth: October 02, 1931   Medicare Important Message Given:  Yes    Kani Jobson Abena 04/24/2016, 1:02 PM

## 2016-04-24 NOTE — Progress Notes (Signed)
Patient Name: Leslie Carr Date of Encounter: 04/24/2016    SUBJECTIVE: Pt having new left hand and left foot swelling. Denies chest pain or palpitations.  TELEMETRY:  NSR since yesterday   Filed Vitals:   04/23/16 2059 04/24/16 0026 04/24/16 0420 04/24/16 0700  BP: 105/58 128/55 129/60 120/58  Pulse: 79 75 74 72  Temp: 98.5 F (36.9 C) 97.6 F (36.4 C) 97.6 F (36.4 C)   TempSrc: Oral Oral Oral   Resp: 25 22 22 21   Height:      Weight:      SpO2: 94% 95% 94% 96%    Intake/Output Summary (Last 24 hours) at 04/24/16 0936 Last data filed at 04/24/16 0420  Gross per 24 hour  Intake    120 ml  Output    270 ml  Net   -150 ml   LABS: Basic Metabolic Panel:  Recent Labs  04/24/16 0415  NA 136  K 3.8  CL 105  CO2 24  GLUCOSE 103*  BUN 12  CREATININE 0.92  CALCIUM 7.5*   CBC:  Recent Labs  04/24/16 0415  WBC 9.2  HGB 8.9*  HCT 27.2*  MCV 89.8  PLT 370   Cardiac Enzymes:  Recent Labs  04/22/16 1425  TROPONINI <0.03    Physical Exam: Blood pressure 120/58, pulse 72, temperature 97.6 F (36.4 C), temperature source Oral, resp. rate 21, height 5\' 1"  (1.549 m), weight 135 lb 9.3 oz (61.5 kg), SpO2 96 %. Weight change:   Wt Readings from Last 3 Encounters:  04/18/16 135 lb 9.3 oz (61.5 kg)  04/15/16 133 lb 8 oz (60.555 kg)  04/09/16 134 lb (60.782 kg)    Physical Exam  Constitutional: She appears well-developed and well-nourished. No distress.  HENT:  Head: Normocephalic and atraumatic.  Nose: Nose normal.  Eyes: Conjunctivae and EOM are normal. No scleral icterus.  Cardiovascular: Normal rate and regular rhythm. Exam reveals no gallop and no friction rub.  Murmur (systolic murmur at LUSB) heard. Pulmonary/Chest: Effort normal. No respiratory distress. Crackles at LLL. She has no wheezes.  Abdominal: Soft. Bowel sounds are normal. She exhibits no distension and no mass. There is no tenderness. There is no rebound and no guarding.   Skin: Skin is warm and dry. No rash noted. She is not diaphoretic. No erythema. No pallor.  Ext: Left hand swelling and left foot swelling, elevated on pillow, no erythema or warmth compared to other side  ASSESSMENT: 82 y/o F w/ PMHx of CAD, dCHF, breast cancer, and recurrent left pleural effusions who went into afib 1 day after left lung pleural decortication on 4/27. She is currently in NSR, likely pt has paroxysmal afib that was subsequently captured on tele during admission.  PLAN: 1. Paroxysmal atrial fibrillation--Currently NSR. CHA2DS2-VASc Score and unadjusted Ischemic Stroke Rate (% per year) is equal to 11.2 % stroke rate/year from a score of 7 (CHF, Vascular=MI/PAD/Aortic Plaque, Age if 65-74, Female,Age > 75, and Stroke).  - xarelto 20mg  qd - amiodarone 400mg  BID, f/u with cardiology outpatient to wean amiodarone as needed  - toprol xl 12.5mg  daily- consider outpatient ECHO  2. Two vessel coronary artery disease--noted on CT chest on 03/04/16, asymptomatic. - crestor 20mg   3. Diastolic congestive heart failure-- EF 48% w/ grade 1 diastolic dysfunction, LVH, diffuse hypokinesis.  - creatinine improved, 0.92 today. With CAD and systolic HF will start on lisinopril 10mg .  - continue BB as above  4. Acute blood loss  anemia-- s/p 4 units RBC transfusion, hgb stable at 8.9 this morning - continue iron 325mg  TID w/ meals  5. Left hand and left foot swelling--new finding this morning. She has had left foot swelling in the past she states is due to hot weather. She has a hx of left breast mastectomy, possible welling due to lymphedema. Also could be due to a DVT however she is on xarelto that was started yesterday. Will continue to monitor.    Boykin Reaper 04/24/2016, 9:36 AM  The patient has been seen in conjunction with D. Hulen Luster, M.D. All aspects of care have been considered and discussed. The patient has been personally interviewed, examined, and all clinical data has  been reviewed.   Contents of the note as presented are accurate. At discharge amiodarone can be decreased to 200 mg twice a day and after 2 weeks, decrease further to 200 mg daily.  She will need outpatient cardiology follow-up.

## 2016-04-24 NOTE — Discharge Summary (Signed)
Physician Discharge Summary  Patient ID: Leslie Carr MRN: [id number] DOB/AGE: May 15, 1931 82 y.o.  Admit date: 04/17/2016 Discharge date: 04/25/2016  Admission Diagnoses: Recurrent left pleural effusion    Discharge Diagnoses:  Active Problems:   CVA (cerebral infarction)   Hyperlipidemia   Recurrent pleural effusion on left   CVA (cerebral vascular accident) ([place])   PAF (paroxysmal atrial fibrillation) ([place])   Aortic valve disease   Patient Active Problem List   Diagnosis Date Noted  . CVA (cerebral vascular accident) ([place]) 04/22/2016  . PAF (paroxysmal atrial fibrillation) ([place]) 04/22/2016  . Aortic valve disease 04/22/2016  . Recurrent pleural effusion on left 04/17/2016  . Recurrent left pleural effusion 04/09/2016  . Lung nodule, solitary 04/09/2016  . Atelectasis 04/09/2016  . CVA (cerebral infarction) 04/09/2016  . Hyperlipidemia 04/09/2016  . Heart murmur 04/09/2016  . History of breast cancer 04/09/2016    HPI: Leslie Carr is an 82 year old woman who was sent for consultation regarding a left pleural effusion.  Leslie Carr is an 82 year old woman with a past medical history significant for hypercholesterolemia, breast cancer treated in 1985, and a stroke affecting her left arm in 2014. She lived in [place] until recently. In August 2016 she was found to have a left pleural effusion. She is not sure why imaging was done and thinks it may have just been a routine checkup. She did not have any complaints of shortness of breath. A thoracentesis was performed and drained 700 mL of fluid. Cytology was negative for malignancy but reported "abnormal T cells". Flow cytometry was negative. Because of this result her family physician felt that she needed to see an oncologist when she came to [place].  She recently saw Dr. Burney Gauze. He recommended a repeat CT scan and it showed a large left pleural effusion. There also was a right middle lobe nodule. An  ultrasound-guided thoracentesis was performed. There was incomplete reexpansion of the left lower lobe on the postthoracentesis film. She was already showing signs of fluid reaccumulation the following day. Cytology was negative for malignancy. A PET CT was done which showed a left pleural effusion with associated atelectasis. There was hypermetabolic enhancing rim along the pleura. There was mild hypermetabolic activity associated with the right middle lobe nodule which radiology felt was most likely infectious or inflammatory.  She has been feeling well. She has some residual left arm and hand weakness due to her stroke in 2014. She has not had any recent changes in her neurologic status. She also has paresthesias in the left arm. She denies any chest pressure or tightness with exertion. She does not have any orthopnea or paroxysmal nocturnal dyspnea. She does get short of breath when she walks up a full flight of stairs, but can do so without stopping. She does have some mild left sided pain under the left breast. She has had a nonproductive cough. She is not aware of any hemoptysis or wheezing. Her appetite is good. She has lost 3 pounds in the past 3 months.  She was admitted for Vats  Discharged Condition: good  Hospital Course: The patient was admitted electively and taken to the operating room where she underwent the below described procedure on 04/17/2016. Please see the detailed operative report, and as noted there was significant intraoperative bleeding encountered. She was stable as to the operating room and transferred to the postanesthesia care unit.  Postoperatively she has done well but recovery has been somewhat slow. The chest tubes were left in  place for several days and she did have a somewhat prolonged air leak which did stop over time. She is also had postoperative atrial fibrillation and has been chemically cardioverted with amiodarone to a sinus rhythm. She has also been seen by  cardiology and is felt to require lifelong NOAC therapy due to a Chads-Vasc score of 7. She has been started on Rivaroxiban. Per Dr. Tamala Julian, Amiodarone was decreased to 200 mg daily. I have decreased her Lisinopril to 5 mg daily as in order to avoid hypotension. Incisions are healing well without evidence of infection. She is tolerating gradually increasing activities using standard protocols. She has an expected acute blood loss anemia which is stable. Renal function is normal. Chest x ray is stable this am. Per Dr. Roxan Hockey, she is surgically stable for discharge today.  Consults: cardiology  Diagnosis 1. Pleura, biopsy, Left - MILDLY INFLAMED MESOTHELIAL LINED FIBROUS TISSUE. - THERE IS NO EVIDENCE OF MALIGNANCY. 2. Pleura, biopsy, Left visceral - MILDLY INFLAMED MESOTHELIAL LINED FIBROUS TISSUE. - THERE IS NO EVIDENCE OF MALIGNANCY. 3. Pleura, peel, Left pleural peel - MILDLY INFLAMED MESOTHELIAL LINED FIBROUS TISSUE. - THERE IS NO EVIDENCE OF MALIGNANCY. 4. Lymph node, biopsy, Level 5 - BENIGN LYMPH NODE WITH ANTHRACOSIS. - THERE IS NO EVIDENCE OF MALIGNANCY. Enid Cutter MD Pathologist, [employer]   Significant Diagnostic Studies: routine labs/CXR's  Treatments: Surgery:  DATE OF PROCEDURE: 04/17/2016 DATE OF DISCHARGE:   OPERATIVE REPORT   PREOPERATIVE DIAGNOSIS: Recurrent left pleural effusion.  POSTOPERATIVE DIAGNOSIS: Recurrent left pleural effusion.  PROCEDURE: Left video-assisted thoracoscopy with conversion to thoracotomy, drainage of left pleural effusion, pleural biopsies, visceral pleural decortication, On-Q local anesthetic catheter placement.  SURGEON: Revonda Standard. Roxan Hockey, M.D.  ASSISTANT: Lars Pinks, PA.  ANESTHESIA: General.  FINDINGS: Large pleural effusion slightly greater than a liter of fluid. Pleural biopsy frozen section revealed chronic inflammation, lung trapped, a good  re-expansion of the lung after decortication, bleeding from pulmonary artery and necessitating conversion to open Thoracotomy.  Latest CXR: EXAM: CHEST 2 VIEW  COMPARISON: Portable chest x-ray of Apr 24, 2016  FINDINGS: The right lung is adequately inflated. Confluent alveolar opacity in the lower third of the right lung is stable. There is a small right pleural effusion slightly more conspicuous today. On the left there is stable volume loss. There are coarse lung markings in the mid and lower lung which are fairly stable. There is a small left pleural effusion. The cardiac silhouette is mildly enlarged. There is mild central pulmonary vascular congestion. The left internal jugular venous catheter tip projects over the proximal SVC. There is moderate to severe degenerative change of the left shoulder.  IMPRESSION: Postsurgical changes on the left. Persistent small bilateral pleural effusions. Stable atelectasis or infiltrate at the left lung base. Mild cardiomegaly with slightly increased central pulmonary vascular congestion.   Electronically Signed  By: David Martinique M.D.  On: 04/25/2016 07:46   Discharge Exam: Blood pressure 109/58, pulse 88, temperature 97.7 F (36.5 C), temperature source Oral, resp. rate 24, height 5\' 1"  (1.549 m), weight 135 lb 9.3 oz (61.5 kg), SpO2 93 %.  General appearance: alert, cooperative and no distress Neurologic: intact Heart: regular rate and rhythm Lungs: diminished breath sounds left base Abdomen: normal findings: soft, non-tender Extremities: edema 1+ left LE Wound: clean and dry  Disposition: Final discharge disposition not confirmed     Medication List    TAKE these medications        amiodarone 200 MG tablet  Commonly  known as:  PACERONE  Take 1 tablet (200 mg total) by mouth daily.     aspirin 81 MG tablet  Take 81 mg by mouth every evening.     docusate sodium 100 MG capsule  Commonly known as:  COLACE  Take  100 mg by mouth every evening.     ferrous sulfate 325 (65 FE) MG tablet  Take 1 tablet (325 mg total) by mouth daily with breakfast. For one month then stop.     lisinopril 5 MG tablet  Commonly known as:  PRINIVIL,ZESTRIL  Take 1 tablet (5 mg total) by mouth daily.     metoprolol succinate 25 MG 24 hr tablet  Commonly known as:  TOPROL-XL  Take 0.5 tablets (12.5 mg total) by mouth daily.     multivitamin with minerals tablet  Take 1 tablet by mouth daily.     pravastatin 20 MG tablet  Commonly known as:  PRAVACHOL  Take 20 mg by mouth every evening.     Rivaroxaban 15 MG Tabs tablet  Commonly known as:  XARELTO  Take 1 tablet (15 mg total) by mouth daily with supper.     traMADol 50 MG tablet  Commonly known as:  ULTRAM  Take 1 tablet (50 mg total) by mouth every 6 (six) hours as needed (mild pain).       Follow-up Information    Follow up with Melrose Nakayama, MD.   Specialty:  Cardiothoracic Surgery   Why:  Appointment with the physician assistant on 05/05/2016 at 2 PM. Please obtain a chest x-ray at [facility] at 1:30 PM. [facility] imaging is located in the same office complex.   Contact information:   [address] [place] [address] [phone]       Follow up with [facility].   Specialty:  Cardiothoracic Surgery   Why:  05/01/2016 at 10:30 AM-appointment with the nurse for suture removal   Contact information:   [place], [place] [phone]      Follow up with Sinclair Grooms, MD. Go on 05/08/2016.   Specialty:  Cardiology   Why:  Follow up with Dr. Tamala Julian on May 18th, 2017 at 1:30pm.   Contact information:   [address] [place] [address] [phone]       Signed: Nani Skillern PA-C 04/25/2016, 10:55 AM

## 2016-04-25 ENCOUNTER — Inpatient Hospital Stay (HOSPITAL_COMMUNITY): Payer: Medicare Other

## 2016-04-25 LAB — GLUCOSE, CAPILLARY
GLUCOSE-CAPILLARY: 98 mg/dL (ref 65–99)
GLUCOSE-CAPILLARY: 98 mg/dL (ref 65–99)

## 2016-04-25 MED ORDER — LISINOPRIL 5 MG PO TABS: 5.0000 mg | ORAL_TABLET | Freq: Every day | ORAL | Status: DC

## 2016-04-25 MED ORDER — AMIODARONE HCL 200 MG PO TABS: 200.0000 mg | ORAL_TABLET | Freq: Every day | ORAL | Status: DC

## 2016-04-25 MED ORDER — AMIODARONE HCL 200 MG PO TABS: 200.0000 mg | ORAL_TABLET | Freq: Two times a day (BID) | ORAL | Status: DC

## 2016-04-25 MED ORDER — METOPROLOL SUCCINATE ER 25 MG PO TB24: 12.5000 mg | ORAL_TABLET | Freq: Every day | ORAL | Status: DC

## 2016-04-25 MED ORDER — RIVAROXABAN 15 MG PO TABS: 15.0000 mg | ORAL_TABLET | Freq: Every day | ORAL | Status: DC

## 2016-04-25 MED ORDER — TRAMADOL HCL 50 MG PO TABS: 50.0000 mg | ORAL_TABLET | Freq: Four times a day (QID) | ORAL | Status: DC | PRN

## 2016-04-25 MED ORDER — AMIODARONE HCL 200 MG PO TABS
200.0000 mg | ORAL_TABLET | Freq: Two times a day (BID) | ORAL | Status: DC
  Administered 2016-04-25: 200 mg via ORAL
  Filled 2016-04-25: qty 1

## 2016-04-25 MED ORDER — FERROUS SULFATE 325 (65 FE) MG PO TABS: 325.0000 mg | ORAL_TABLET | Freq: Every day | ORAL | Status: DC

## 2016-04-25 MED ORDER — LISINOPRIL 5 MG PO TABS
5.0000 mg | ORAL_TABLET | Freq: Every day | ORAL | Status: DC
Start: 2016-04-25 — End: 2016-05-08

## 2016-04-25 MED ORDER — LISINOPRIL 10 MG PO TABS: 10.0000 mg | ORAL_TABLET | Freq: Every day | ORAL | Status: DC

## 2016-04-25 NOTE — Discharge Instructions (Signed)
Video-Assisted Thoracic Surgery, Care After  Refer to this sheet in the next few weeks. These instructions provide you with information on caring for yourself after your procedure. Your doctor may also give you more specific instructions. Your procedure has been planned according to current medical practices, but problems sometimes occur. Call your doctor if you have any problems or questions after your procedure. HOME CARE   Only take over-the-counter or prescription medicines as told by your doctor.  Only take pain medicines (narcotics) as told by your doctor.  Do not drive until your doctor says it is OK. Driving while taking pain medicines or soon after surgery can be dangerous.  Avoid activities that use your chest muscles for at least 3-4 weeks. These include lifting heavy objects.  Take deep breaths. This expands the lungs and protects against a lung infection (pneumonia).  Do breathing exercises as told by your doctor. If you were given a device to help with breathing, use it as told by your doctor.  You may resume a normal diet and activities when you feel you are able to or as told by your doctor.  Do not take a bath until your doctor says it is OK. Use the shower instead.  Keep the bandage (dressing) covering the area where the chest tube was put (incision site) dry for 48 hours. Remove the bandage after 48 hours unless there is new fluid on the bandage.  Remove bandages as told by your doctor.  Change bandages if necessary or as told by your doctor.  Keep all follow-up doctor visits. It is important to see your doctor after surgery. You may need follow-up care and your condition may need to be watched. GET HELP RIGHT AWAY IF:  You have a fever.  You have chest pain.  You have a rash.  You have shortness of breath.  You have trouble breathing.  You feel weak, lightheaded, dizzy, or faint.  You feel a lot of pain near an area where a surgical cut was made.  The  pain at an area where a surgical cut was made is getting worse.  You notice bleeding, fluid, or pus coming from where a surgical cut was made.  You notice irritation, puffiness (swelling), or redness near the surgical cut.  There is a bad smell coming from from a bandage or from an area where a surgical cut was made.  It feels like your heart is fluttering or beating rapidly.  Your pain medicine does not relieve your pain. MAKE SURE YOU:   Understand these instructions.  Will watch your condition.  Will get help right away if you are not doing well or get worse.   This information is not intended to replace advice given to you by your health care provider. Make sure you discuss any questions you have with your health care provider.   Document Released: 04/04/2013 Document Revised: 12/29/2014 Document Reviewed: 04/04/2013 Elsevier Interactive Patient Education 2016 [facility] on my medicine - XARELTO (Rivaroxaban)  This medication education was reviewed with me or my healthcare representative as part of my discharge preparation.  The pharmacist that spoke with me during my hospital stay was:  Reginia Naas, [facility]  Why was Xarelto prescribed for you? Xarelto was prescribed for you to reduce the risk of a blood clot forming that can cause a stroke if you have a medical condition called atrial fibrillation (a type of irregular heartbeat).  What do you need to know about xarelto ?  Take your Xarelto ONCE DAILY at the same time every day with your evening meal. If you have difficulty swallowing the tablet whole, you may crush it and mix in applesauce just prior to taking your dose.  Take Xarelto exactly as prescribed by your doctor and DO NOT stop taking Xarelto without talking to the doctor who prescribed the medication.  Stopping without other stroke prevention medication to take the place of Xarelto may increase your risk of developing a clot that causes  a stroke.  Refill your prescription before you run out.  After discharge, you should have regular check-up appointments with your healthcare provider that is prescribing your Xarelto.  In the future your dose may need to be changed if your kidney function or weight changes by a significant amount.  What do you do if you miss a dose? If you are taking Xarelto ONCE DAILY and you miss a dose, take it as soon as you remember on the same day then continue your regularly scheduled once daily regimen the next day. Do not take two doses of Xarelto at the same time or on the same day.   Important Safety Information A possible side effect of Xarelto is bleeding. You should call your healthcare provider right away if you experience any of the following: ? Bleeding from an injury or your nose that does not stop. ? Unusual colored urine (red or dark brown) or unusual colored stools (red or black). ? Unusual bruising for unknown reasons. ? A serious fall or if you hit your head (even if there is no bleeding).  Some medicines may interact with Xarelto and might increase your risk of bleeding while on Xarelto. To help avoid this, consult your healthcare provider or pharmacist prior to using any new prescription or non-prescription medications, including herbals, vitamins, non-steroidal anti-inflammatory drugs (NSAIDs) and supplements.  This website has more information on Xarelto: [email or web address].[email or web address]

## 2016-04-25 NOTE — Progress Notes (Signed)
Patient Name: Leslie Carr Date of Encounter: 04/25/2016    SUBJECTIVE: Pt denies chest pain or palpitations. Left hand and left foot swelling improved.   TELEMETRY:  NSR, no evidence of afib on tele review   Filed Vitals:   04/24/16 2000 04/24/16 2300 04/25/16 0502 04/25/16 0844  BP: 109/50 122/52 123/54 109/58  Pulse: 80 82 81 88  Temp: 99.4 F (37.4 C) 98.9 F (37.2 C) 97.9 F (36.6 C) 97.7 F (36.5 C)  TempSrc: Oral Oral Oral Oral  Resp: 30 23 25 24   Height:      Weight:      SpO2: 93% 94% 96% 93%    Intake/Output Summary (Last 24 hours) at 04/25/16 1038 Last data filed at 04/25/16 0600  Gross per 24 hour  Intake 2698.67 ml  Output    450 ml  Net 2248.67 ml   LABS: Basic Metabolic Panel:  Recent Labs  04/24/16 0415  NA 136  K 3.8  CL 105  CO2 24  GLUCOSE 103*  BUN 12  CREATININE 0.92  CALCIUM 7.5*   CBC:  Recent Labs  04/24/16 0415  WBC 9.2  HGB 8.9*  HCT 27.2*  MCV 89.8  PLT 370   Cardiac Enzymes:  Recent Labs  04/22/16 1425  TROPONINI <0.03    Physical Exam: Blood pressure 109/58, pulse 88, temperature 97.7 F (36.5 C), temperature source Oral, resp. rate 24, height 5\' 1"  (1.549 m), weight 135 lb 9.3 oz (61.5 kg), SpO2 93 %. Weight change:   Wt Readings from Last 3 Encounters:  04/18/16 135 lb 9.3 oz (61.5 kg)  04/15/16 133 lb 8 oz (60.555 kg)  04/09/16 134 lb (60.782 kg)    Physical Exam  Constitutional: She appears well-developed and well-nourished. No distress.  HENT:  Head: Normocephalic and atraumatic.  Nose: Nose normal.  Eyes: Conjunctivae and EOM are normal. No scleral icterus.  Cardiovascular: Normal rate and regular rhythm. Exam reveals no gallop and no friction rub.  Murmur (systolic murmur at LUSB) heard. Pulmonary/Chest: Effort normal. No respiratory distress.  Abdominal: Soft. Bowel sounds are normal. She exhibits no distension and no mass. There is no tenderness. There is no rebound and no  guarding.  Skin: Skin is warm and dry. No rash noted. She is not diaphoretic. No erythema. No pallor.  Ext: Left hand swelling resolved.   ASSESSMENT: 82 y/o F w/ PMHx of CAD, dCHF, breast cancer, and recurrent left pleural effusions who went into afib 1 day after left lung pleural decortication on 4/27. She is currently in NSR, likely pt has paroxysmal afib that was subsequently captured on tele during admission. She is going to be discharged home today.   PLAN: 1. Paroxysmal atrial fibrillation--Currently NSR. CHA2DS2-VASc Score and unadjusted Ischemic Stroke Rate (% per year) is equal to 11.2 % stroke rate/year from a score of 7 (CHF, Vascular=MI/PAD/Aortic Plaque, Age if 65-74, Female,Age > 75, and Stroke).  - xarelto 20mg  qd, this will be indefinite therapy - amiodarone 200mg  BID, has cardiology follow up with Dr. Tamala Julian on 5/18 to wean amiodarone as needed - toprol xl 12.5mg  daily  2. Two vessel coronary artery disease--noted on CT chest on 03/04/16, asymptomatic. - crestor 20mg   3. Diastolic congestive heart failure-- EF 48% w/ grade 1 diastolic dysfunction, LVH, diffuse hypokinesis.  - lisinopril 10mg  and toprol xl 12.5 mg daily  4. Acute blood loss anemia-- s/p 4 units RBC transfusion, hgb stable at 8.9 this morning - continue  iron 325mg  TID w/ meals  5. Left hand and left foot swelling--resolved  Signed, Julious Oka 04/25/2016, 10:38 AM

## 2016-04-25 NOTE — Care Management Note (Signed)
Case Management Note  Patient Details  Name: MILLICENT SHAREEF MRN: [id number] Date of Birth: 1931-01-28  Subjective/Objective:    Patient discharged home with daughter today, she has rolling walker and bsc at home.  Her daughter is a Therapist, sports also.  Per pt eval no pt follow up needed.                  Action/Plan:   Expected Discharge Date:                  Expected Discharge Plan:  Home/Self Care  In-House Referral:     Discharge planning Services  CM Consult  Post Acute Care Choice:    Choice offered to:     DME Arranged:    DME Agency:     HH Arranged:    [place] Agency:     Status of Service:  Completed, signed off  Medicare Important Message Given:  Yes Date Medicare IM Given:    Medicare IM give by:    Date Additional Medicare IM Given:    Additional Medicare Important Message give by:     If discussed at [place] of Stay Meetings, dates discussed:    Additional Comments:  Zenon Mayo, RN 04/25/2016, 2:24 PM

## 2016-04-25 NOTE — Progress Notes (Signed)
8 Days Post-Op Procedure(s) (LRB):  Left VIDEO ASSISTED THORACOSCOPY with Drainage of Pleural Effusion, Pleural Biopsy, Visceral Pleural Decortication and Placement of On-Q pain pump (Left)  Left PLEURAL BIOPSY (Left) Visceral Pleural DECORTICATION (Left) DRAINAGE OF Left PLEURAL EFFUSION (Left) Subjective: No complaints. Hand swelling resolved, leg about the same  Objective: Vital signs in last 24 hours: Temp:  [97.9 F (36.6 C)-99.4 F (37.4 C)] 97.9 F (36.6 C) (05/05 0502) Pulse Rate:  [79-87] 81 (05/05 0502) Cardiac Rhythm:  [-] Normal sinus rhythm (05/05 0746) Resp:  [18-30] 25 (05/05 0502) BP: (109-123)/(50-57) 123/54 mmHg (05/05 0502) SpO2:  [93 %-96 %] 96 % (05/05 0502)  Hemodynamic parameters for last 24 hours:    Intake/Output from previous day: 05/04 0701 - 05/05 0700 In: 2698.7 [P.O.:480; I.V.:2218.7] Out: 450 [Urine:450] Intake/Output this shift:    General appearance: alert, cooperative and no distress Neurologic: intact Heart: regular rate and rhythm Lungs: diminished breath sounds left base Abdomen: normal findings: soft, non-tender Extremities: edema 1+ left LE Wound: clean and dry  Lab Results:  Recent Labs  04/24/16 0415  WBC 9.2  HGB 8.9*  HCT 27.2*  PLT 370   BMET:  Recent Labs  04/24/16 0415  NA 136  K 3.8  CL 105  CO2 24  GLUCOSE 103*  BUN 12  CREATININE 0.92  CALCIUM 7.5*    PT/INR: No results for input(s): LABPROT, INR in the last 72 hours. ABG    Component Value Date/Time   PHART 7.390 04/18/2016 0411   HCO3 21.3 04/18/2016 0411   TCO2 22 04/18/2016 0411   ACIDBASEDEF 3.0* 04/18/2016 0411   O2SAT 92.0 04/18/2016 0411   CBG (last 3)   Recent Labs  04/24/16 1545 04/24/16 2133 04/25/16 0733  GLUCAP 133* 113* 98    Assessment/Plan: S/P Procedure(s) (LRB):  Left VIDEO ASSISTED THORACOSCOPY with Drainage of Pleural Effusion, Pleural Biopsy, Visceral Pleural Decortication and Placement of On-Q pain pump (Left)  Left PLEURAL BIOPSY (Left) Visceral Pleural DECORTICATION (Left) DRAINAGE OF Left PLEURAL EFFUSION (Left) Plan for discharge: see discharge orders  Doing well DC central line Dc home today She will go home on Xarelto + baby aspirin Amiodarone 200 mg PO BID Will need follow up with Dr. Tamala Julian as outpatient   LOS: 8 days    Leslie Carr 04/25/2016

## 2016-04-25 NOTE — Progress Notes (Signed)
Discharge Note. Discharge instructions and education done with patient and patient's daughter at the bedside. All Rx's reviewed and given. Pt's central line was removed this morning with no complications and patient tolerated well. Pt and daughter thanked staff for taking care of her. Patient discharged.

## 2016-04-25 NOTE — Evaluation (Signed)
Physical Therapy Evaluation and D/C Patient Details Name: Leslie Carr MRN: [id number] DOB: [birth year] Today's Date: 04/25/2016   History of Present Illness  82 y/o F w/ PMHx of CAD, dCHF, breast cancer, and recurrent left pleural effusions who went into afib 1 day after left lung pleural decortication on 4/27. She is currently in NSR, likely pt has paroxysmal afib that was subsequently captured on tele during admission. She is going to be discharged home today.   Clinical Impression  Pt admitted with above diagnosis. Pt currently with functional limitations due to the deficits listed below (see PT Problem List). Pt was able to ambulate in hall and ascend and descend steps with min guard assist to supervision.  Going home with daughter and will have 24 hour assist at home.  Going home today. D/C PT secondary to education complete.    Follow Up Recommendations No PT follow up    Equipment Recommendations  None recommended by PT (has equipment that she may need)    Recommendations for Other Services       Precautions / Restrictions Precautions Precautions: Fall Restrictions Weight Bearing Restrictions: No      Mobility  Bed Mobility Overal bed mobility: Independent                Transfers Overall transfer level: Independent Equipment used: None             General transfer comment: No LOB with standing.   Ambulation/Gait Ambulation/Gait assistance: Min guard Ambulation Distance (Feet): 150 Feet Assistive device: None Gait Pattern/deviations: Step-through pattern;Decreased stride length   Gait velocity interpretation: Below normal speed for age/gender General Gait Details: Able to ambulate without device with mild unsteady gait but no LOB.  Pt wants to use her RW at home for safety.  Daughter will be home to assist as needed.    Stairs Stairs: Yes Stairs assistance: Min guard Stair Management: One rail Left;Step to pattern;Forwards Number of Stairs:  13 General stair comments: Pt was able to go up and down stairs without LOB using rail and taking her time.  Pt surprised she wasnt more SOB.  HR 84-120 bpm with going up and down stairs.    Wheelchair Mobility    Modified Rankin (Stroke Patients Only)       Balance Overall balance assessment: Needs assistance         Standing balance support: No upper extremity supported;During functional activity Standing balance-Leahy Scale: Fair Standing balance comment: Pt can stand statically without UE support.              High level balance activites: Direction changes;Turns;Sudden stops High Level Balance Comments: min guard assist without device.  Supervision with device.              Pertinent Vitals/Pain Pain Assessment: 0-10 Pain Score: 5  Pain Location: left side Pain Descriptors / Indicators: Aching Pain Intervention(s): Repositioned;Limited activity within patient's tolerance;Monitored during session  HR 84-120 bpm, O2 >92% on RA with activity.     Home Living Family/patient expects to be discharged to:: Private residence Living Arrangements: Children;Spouse/significant other Available Help at Discharge: Family;Available 24 hours/day (daughter and husband with dementia) Type of Home: House Home Access: Stairs to enter Entrance Stairs-Rails: Left Entrance Stairs-Number of Steps: 15 Home Layout: Two level Home Equipment: Walker - 2 wheels;Cane - single point;Shower seat      Prior Function Level of Independence: Independent  Hand Dominance        Extremity/Trunk Assessment   Upper Extremity Assessment: Defer to OT evaluation           Lower Extremity Assessment: Generalized weakness      Cervical / Trunk Assessment: Normal  Communication   Communication: No difficulties  Cognition Arousal/Alertness: Awake/alert Behavior During Therapy: WFL for tasks assessed/performed Overall Cognitive Status: Within Functional Limits for  tasks assessed                      General Comments      Exercises        Assessment/Plan    PT Assessment Patent does not need any further PT services  PT Diagnosis Generalized weakness   PT Problem List Decreased activity tolerance;Decreased balance;Decreased mobility;Decreased knowledge of use of DME;Decreased safety awareness;Decreased knowledge of precautions;Cardiopulmonary status limiting activity  PT Treatment Interventions Gait training;Stair training;Patient/family education   PT Goals (Current goals can be found in the Care Plan section) Acute Rehab PT Goals PT Goal Formulation: All assessment and education complete, DC therapy    Frequency     Barriers to discharge        Co-evaluation               End of Session Equipment Utilized During Treatment: Gait belt Activity Tolerance: Patient limited by fatigue Patient left: in chair;with call bell/phone within reach;with family/visitor present Nurse Communication: Mobility status         Time: [id number] PT Time Calculation (min) (ACUTE ONLY): 14 min   Charges:   PT Evaluation $PT Eval Moderate Complexity: 1 Procedure     PT G CodesDenice Paradise May 07, 2016, 1:14 PM [facility] Acute Rehabilitation [phone] (pager)

## 2016-04-25 NOTE — Progress Notes (Signed)
   The patient is being discharged today.  She will be discharged on amiodarone 200 mg daily. I decided to decrease the dose because she is also on low-dose beta blocker therapy.  She will need cardiology clinic follow-up to determine if long-term amiodarone is needed. We'll likely need a 30 day monitor on amiodarone before before discontinuing therapy.

## 2016-04-28 ENCOUNTER — Ambulatory Visit
Admission: RE | Admit: 2016-04-28 | Discharge: 2016-04-28 | Disposition: A | Discharge status: Home / Self Care | Payer: Medicare Other | Source: Ambulatory Visit | Attending: Thoracic Surgery (Cardiothoracic Vascular Surgery) | Admitting: Thoracic Surgery (Cardiothoracic Vascular Surgery)

## 2016-04-28 ENCOUNTER — Ambulatory Visit (INDEPENDENT_AMBULATORY_CARE_PROVIDER_SITE_OTHER): Payer: Self-pay | Admitting: Surgical

## 2016-04-28 ENCOUNTER — Other Ambulatory Visit: Payer: Medicare Other

## 2016-04-28 ENCOUNTER — Ambulatory Visit: Payer: Medicare Other | Admitting: Hematology & Oncology

## 2016-04-28 ENCOUNTER — Other Ambulatory Visit: Payer: Self-pay | Admitting: *Deleted

## 2016-04-28 VITALS — BP 121/76 | HR 93 | Temp 97.1°F | Resp 16 | Ht 61.0 in | Wt 149.0 lb

## 2016-04-28 DIAGNOSIS — R0602 Shortness of breath: Secondary | ICD-10-CM

## 2016-04-28 DIAGNOSIS — Z853 Personal history of malignant neoplasm of breast: Secondary | ICD-10-CM

## 2016-04-28 DIAGNOSIS — J90 Pleural effusion, not elsewhere classified: Secondary | ICD-10-CM | POA: Diagnosis not present

## 2016-04-28 DIAGNOSIS — Z09 Encounter for follow-up examination after completed treatment for conditions other than malignant neoplasm: Secondary | ICD-10-CM

## 2016-04-28 DIAGNOSIS — E8770 Fluid overload, unspecified: Secondary | ICD-10-CM

## 2016-04-28 DIAGNOSIS — I509 Heart failure, unspecified: Secondary | ICD-10-CM | POA: Diagnosis not present

## 2016-04-28 MED ORDER — POTASSIUM CHLORIDE CRYS ER 20 MEQ PO TBCR: 10.0000 meq | EXTENDED_RELEASE_TABLET | Freq: Every day | ORAL | Status: DC

## 2016-04-28 MED ORDER — FUROSEMIDE 40 MG PO TABS: 40.0000 mg | ORAL_TABLET | Freq: Every day | ORAL | Status: DC

## 2016-04-28 NOTE — Progress Notes (Signed)
[address]       [place] [address]             [phone]                  Juliannah A Situ Amalga Medical Record [id number] Date of Birth: Apr 18, 1931  Referring [id number], Truddie Coco Primary Cardiology: Primary Council, PA-C  Chief Complaint:  Follow Up Visit DATE OF PROCEDURE: 04/17/2016 DATE OF DISCHARGE:   OPERATIVE REPORT   PREOPERATIVE DIAGNOSIS: Recurrent left pleural effusion.  POSTOPERATIVE DIAGNOSIS: Recurrent left pleural effusion.  PROCEDURE: Left video-assisted thoracoscopy with conversion to thoracotomy, drainage of left pleural effusion, pleural biopsies, visceral pleural decortication, On-Q local anesthetic catheter placement.  SURGEON: Revonda Standard. Roxan Hockey, M.D.  ASSISTANT: Lars Pinks, PA.  ANESTHESIA: General.  FINDINGS: Large pleural effusion slightly greater than a liter of fluid. Pleural biopsy frozen section revealed chronic inflammation, lung trapped, a good re-expansion of the lung after decortication, bleeding from pulmonary artery and necessitating conversion to open Thoracotomy. History of Present Illness:     The patient is a 82 year old white female status post above described procedure. She was discharged from the hospital 3 days ago. She and the family noted increasing lower extremity edema. She is only short of breath with ambulation and has no orthopnea. She denies chest pain. She does have some incisional discomfort. She has not had any further palpitations. She does not have fevers, chills or other constitutional symptoms. Generally she feels fairly well but this is concerning to her and her family.     Zubrod Score: At the time of surgery this patient's most appropriate activity status/level should be described as: []     0    Normal activity, no symptoms []     1    Restricted in physical strenuous activity but ambulatory, able to do out  light work []     2    Ambulatory and capable of self care, unable to do work activities, up and about                 >50 % of waking hours                                                                                   []     3    Only limited self care, in bed greater than 50% of waking hours []     4    Completely disabled, no self care, confined to bed or chair []     5    Moribund  History  Smoking status  . Former Smoker  Smokeless tobacco  . Never Used    Comment: Quit over 40 years ago       Allergies  Allergen Reactions  . Lipitor [Atorvastatin] Other (See Comments)    Dizzy and make her head feel huge     Current Outpatient Prescriptions  Medication Sig Dispense Refill  . amiodarone (PACERONE) 200 MG tablet Take 1 tablet (200 mg total) by mouth daily. 30 tablet 1  . aspirin 81 MG tablet Take 81 mg by mouth every evening.     . docusate sodium (  COLACE) 100 MG capsule Take 100 mg by mouth every evening.    . ferrous sulfate 325 (65 FE) MG tablet Take 1 tablet (325 mg total) by mouth daily with breakfast. For one month then stop.  3  . metoprolol succinate (TOPROL-XL) 25 MG 24 hr tablet Take 0.5 tablets (12.5 mg total) by mouth daily. 30 tablet 1  . Multiple Vitamins-Minerals (MULTIVITAMIN WITH MINERALS) tablet Take 1 tablet by mouth daily.    . pravastatin (PRAVACHOL) 20 MG tablet Take 20 mg by mouth every evening.     . Rivaroxaban (XARELTO) 15 MG TABS tablet Take 1 tablet (15 mg total) by mouth daily with supper. 30 tablet 1  . traMADol (ULTRAM) 50 MG tablet Take 1 tablet (50 mg total) by mouth every 6 (six) hours as needed (mild pain). 30 tablet 0  . furosemide (LASIX) 40 MG tablet Take 1 tablet (40 mg total) by mouth daily. 14 tablet 0  . lisinopril (PRINIVIL,ZESTRIL) 5 MG tablet Take 1 tablet (5 mg total) by mouth daily. (Patient not taking: Reported on 04/28/2016) 30 tablet 1  . potassium chloride (K-DUR,KLOR-CON) 20 MEQ tablet Take 0.5 tablets (10 mEq total) by mouth  daily. 14 tablet 0   No current facility-administered medications for this visit.       Physical Exam: BP 121/76 mmHg  Pulse 93  Temp(Src) 97.1 F (36.2 C) (Oral)  Resp 16  Ht 5\' 1"  (1.549 m)  Wt 149 lb (67.586 kg)  BMI 28.17 kg/m2  SpO2 89%  General appearance: alert, cooperative and no distress Heart: regular rate and rhythm, no rub and No murmur Lungs: Mildly diminished in the left base Abdomen: Benign exam Extremities: 2 plus lower extremity edema Wound: Incisions noted to be healing well without evidence of infection. Wounds:  Diagnostic Studies & Laboratory data:         Recent Radiology Findings: Dg Chest 2 View  04/28/2016  CLINICAL DATA:  Patient underwent pleural biopsy and surgical decortication and pleural effusion drainage on April 17, 2016; patient reports left-sided chest soreness in generalize weakness; history of breast malignancy, CHF. EXAM: CHEST  2 VIEW COMPARISON:  None in PACs FINDINGS: The right lung is well-expanded. There is a small right pleural effusion. There is an approximately 2 x 3 cm area of alveolar opacity in the right lower lung. On the left the interstitial markings are increased in the mid and lower lung. Small amounts of pleural fluid and/or pleural thickening are noted inferiorly and laterally. The cardiac silhouette is mildly enlarged. The pulmonary vascularity is not engorged. There is dense calcification in the aortic arch. There are degenerative changes of the left shoulder and at multiple thoracic disc levels. IMPRESSION: 1. Small left pleural effusion. Increased interstitial density in the left mid and lower lung which may reflect residual atelectasis or pneumonia. Small right pleural effusion. Area of alveolar opacity in the lower third of the right lung may reflect pneumonia or other alveolar filling process. 2. Mild cardiomegaly without significant pulmonary vascular congestion. 3. Chest CT scanning is recommended. Given the lack of any  previous radiographic studies for comparison. Electronically Signed   By: David  Martinique M.D.   On: 04/28/2016 15:35      I have independently reviewed the above radiology findings and reviewed findings  with the patient.  Recent Labs: Lab Results  Component Value Date   WBC 9.2 04/24/2016   HGB 8.9* 04/24/2016   HCT 27.2* 04/24/2016   PLT 370 04/24/2016   GLUCOSE  103* 04/24/2016   ALT 14 04/19/2016   AST 24 04/19/2016   NA 136 04/24/2016   K 3.8 04/24/2016   CL 105 04/24/2016   CREATININE 0.92 04/24/2016   BUN 12 04/24/2016   CO2 24 04/24/2016   TSH 2.961 04/20/2016   INR 1.02 04/15/2016      Assessment / Plan: Increased lower extremity edema since time of discharge, although she did have some then. She does not appear to be acutely ill. Chest x-ray does not appear to show frank congestive failure although she does have some effusion. The infiltrative changes are most likely related to the decortication and atelectasis. She does not appear to be septic. I will place her on Lasix 40 mg daily and potassium chloride 20 mEq daily for 14 days. She is elevating her lower extremities as able already. We will see again in 1 week or prior to that should any new symptoms arise or if she does not improve with the diuresis. She is are ready noted to be on xarelto which would be appropriate treatment for this to be associated with DVT. She is on this for atrial fibrillation with a high Chads-Vasc score of 7. Her most recent renal function is within normal limits. There may be a component of nutritional impairment related to lower protein stores due to recent long hospitalization.        Ruqayya Ventress E 04/28/2016 4:04 PM

## 2016-04-28 NOTE — Patient Instructions (Signed)
Patient is instructed to follow-up in one week. She is instructed to keep her legs elevated. She is instructed to take medications as prescribed.

## 2016-05-02 ENCOUNTER — Other Ambulatory Visit: Payer: Self-pay | Admitting: Thoracic Surgery (Cardiothoracic Vascular Surgery)

## 2016-05-02 DIAGNOSIS — J90 Pleural effusion, not elsewhere classified: Secondary | ICD-10-CM

## 2016-05-05 ENCOUNTER — Ambulatory Visit (INDEPENDENT_AMBULATORY_CARE_PROVIDER_SITE_OTHER): Payer: Self-pay | Admitting: Surgical

## 2016-05-05 ENCOUNTER — Ambulatory Visit
Admission: RE | Admit: 2016-05-05 | Discharge: 2016-05-05 | Disposition: A | Discharge status: Home / Self Care | Payer: Medicare Other | Source: Ambulatory Visit | Attending: Thoracic Surgery (Cardiothoracic Vascular Surgery) | Admitting: Thoracic Surgery (Cardiothoracic Vascular Surgery)

## 2016-05-05 VITALS — BP 89/54 | HR 85 | Resp 20 | Ht 61.0 in | Wt 135.0 lb

## 2016-05-05 DIAGNOSIS — Z09 Encounter for follow-up examination after completed treatment for conditions other than malignant neoplasm: Secondary | ICD-10-CM

## 2016-05-05 DIAGNOSIS — J90 Pleural effusion, not elsewhere classified: Secondary | ICD-10-CM | POA: Diagnosis not present

## 2016-05-05 NOTE — Progress Notes (Signed)
[address]       [place] [address]             [phone]                  Kao A Kempner [place] Medical Record [id number] Date of Birth: 09-09-1931  Referring [id number], Rudell Cobb, MD Primary Cardiology: Primary Care:HEPLER,MARK, PA-C  Chief Complaint:  Follow Up Visit Follow-up  History of Present Illness:      Patient seen again in the office on today's date in follow-up. The lower extremity swelling has improved and she has approached her preoperative weight. She continues to have multiple complaints including cough, nausea and poor appetite. Her blood pressure has been low at times and on today's date systolic is less than 90. She is slow and activity progression but is slightly improved as far as exercise tolerance.       Zubrod Score: At the time of surgery this patient's most appropriate activity status/level should be described as: []     0    Normal activity, no symptoms []     1    Restricted in physical strenuous activity but ambulatory, able to do out light work []     2    Ambulatory and capable of self care, unable to do work activities, up and about                 >50 % of waking hours                                                                                   []     3    Only limited self care, in bed greater than 50% of waking hours []     4    Completely disabled, no self care, confined to bed or chair []     5    Moribund  History  Smoking status  . Former Smoker  Smokeless tobacco  . Never Used    Comment: Quit over 40 years ago       Allergies  Allergen Reactions  . Lipitor [Atorvastatin] Other (See Comments)    Dizzy and make her head feel huge     Current Outpatient Prescriptions  Medication Sig Dispense Refill  . amiodarone (PACERONE) 200 MG tablet Take 1 tablet (200 mg total) by mouth daily. 30 tablet 1  . aspirin 81 MG tablet Take 81 mg by mouth every evening.     . docusate sodium (COLACE) 100 MG  capsule Take 100 mg by mouth every evening.    . ferrous sulfate 325 (65 FE) MG tablet Take 1 tablet (325 mg total) by mouth daily with breakfast. For one month then stop.  3  . furosemide (LASIX) 40 MG tablet Take 1 tablet (40 mg total) by mouth daily. 14 tablet 0  . lisinopril (PRINIVIL,ZESTRIL) 5 MG tablet Take 1 tablet (5 mg total) by mouth daily. 30 tablet 1  . metoprolol succinate (TOPROL-XL) 25 MG 24 hr tablet Take 0.5 tablets (12.5 mg total) by mouth daily. 30 tablet 1  . Multiple Vitamins-Minerals (MULTIVITAMIN WITH MINERALS) tablet Take 1 tablet by  mouth daily.    . potassium chloride (K-DUR,KLOR-CON) 20 MEQ tablet Take 0.5 tablets (10 mEq total) by mouth daily. 14 tablet 0  . pravastatin (PRAVACHOL) 20 MG tablet Take 20 mg by mouth every evening.     . Rivaroxaban (XARELTO) 15 MG TABS tablet Take 1 tablet (15 mg total) by mouth daily with supper. 30 tablet 1  . traMADol (ULTRAM) 50 MG tablet Take 1 tablet (50 mg total) by mouth every 6 (six) hours as needed (mild pain). 30 tablet 0   No current facility-administered medications for this visit.       Physical Exam: BP 89/54 mmHg  Pulse 85  Resp 20  Ht 5\' 1"  (1.549 m)  Wt 135 lb (61.236 kg)  BMI 25.52 kg/m2  SpO2 96%  General appearance: alert, cooperative, fatigued and no distress Heart: regular rate and rhythm Lungs: Diminished breath sounds in the left lower lung fields Abdomen: Soft nontender Extremities: Bilateral lower extremity edema, improved Wound: Incisions healing well without evidence of infection   Diagnostic Studies & Laboratory data:         Recent Radiology Findings: Dg Chest 2 View  05/05/2016  CLINICAL DATA:  Recurrent left-sided pleural effusion EXAM: CHEST  2 VIEW COMPARISON:  PA and lateral chest x-ray of Apr 28, 2016 FINDINGS: The right lung is adequately inflated. There is a small stable pleural effusion. There is stable parenchymal density that projects just inferior to the minor fissure on the  frontal view. On the left there is persistent increased interstitial density in the mid and lower lung. There is a persistent small pleural effusion. The cardiac silhouette remains enlarged. The pulmonary vascularity is less prominent today. There is mild multilevel degenerative disc disease of the thoracic spine. IMPRESSION: Persistent small bilateral pleural effusions. Persistent focal parenchymal opacity in the right mid to lower lung. Slightly improved interstitial markings in the left mid and lower lung. Electronically Signed   By: David  Martinique M.D.   On: 05/05/2016 13:45      I have independently reviewed the above radiology findings and reviewed findings  with the patient.  Recent Labs: Lab Results  Component Value Date   WBC 9.2 04/24/2016   HGB 8.9* 04/24/2016   HCT 27.2* 04/24/2016   PLT 370 04/24/2016   GLUCOSE 103* 04/24/2016   ALT 14 04/19/2016   AST 24 04/19/2016   NA 136 04/24/2016   K 3.8 04/24/2016   CL 105 04/24/2016   CREATININE 0.92 04/24/2016   BUN 12 04/24/2016   CO2 24 04/24/2016   TSH 2.961 04/20/2016   INR 1.02 04/15/2016      Assessment / Plan:  The patient does continue to have some struggles in her overall recovery. She is in sinus rhythm clinically and chest x-ray showing slow improvement. I believe we can stop the Lasix at this time. I also think we should stop the ACE inhibitor as well as amiodarone due to nausea and low blood pressure. She is scheduled see cardiology later in the week. She will continue on eliquis for her high Chads2vasc score and possible lower extremity DVT. Edema is probably multifactorial related to this as well as decreased nutritional protein stores we will see again in 2-3 weeks in the office for follow-up. She is encouraged to increase activity as tolerated including keeping legs elevated when not ambulating.         GOLD,WAYNE E 05/05/2016 2:25 PM

## 2016-05-08 ENCOUNTER — Ambulatory Visit (INDEPENDENT_AMBULATORY_CARE_PROVIDER_SITE_OTHER): Payer: Medicare Other | Admitting: Physician Assistant

## 2016-05-08 ENCOUNTER — Encounter: Payer: Self-pay | Admitting: Physician Assistant

## 2016-05-08 VITALS — BP 98/40 | HR 84 | Ht 61.0 in | Wt 134.8 lb

## 2016-05-08 DIAGNOSIS — I714 Abdominal aortic aneurysm, without rupture, unspecified: Secondary | ICD-10-CM

## 2016-05-08 DIAGNOSIS — I5042 Chronic combined systolic (congestive) and diastolic (congestive) heart failure: Secondary | ICD-10-CM

## 2016-05-08 DIAGNOSIS — I480 Paroxysmal atrial fibrillation: Secondary | ICD-10-CM | POA: Diagnosis not present

## 2016-05-08 DIAGNOSIS — D649 Anemia, unspecified: Secondary | ICD-10-CM | POA: Diagnosis not present

## 2016-05-08 DIAGNOSIS — Z8709 Personal history of other diseases of the respiratory system: Secondary | ICD-10-CM

## 2016-05-08 DIAGNOSIS — I509 Heart failure, unspecified: Secondary | ICD-10-CM | POA: Diagnosis not present

## 2016-05-08 DIAGNOSIS — I2510 Atherosclerotic heart disease of native coronary artery without angina pectoris: Secondary | ICD-10-CM

## 2016-05-08 DIAGNOSIS — C50912 Malignant neoplasm of unspecified site of left female breast: Secondary | ICD-10-CM

## 2016-05-08 DIAGNOSIS — I639 Cerebral infarction, unspecified: Secondary | ICD-10-CM

## 2016-05-08 DIAGNOSIS — E46 Unspecified protein-calorie malnutrition: Secondary | ICD-10-CM

## 2016-05-08 LAB — BASIC METABOLIC PANEL
BUN: 11 mg/dL (ref 7–25)
CO2: 25 mmol/L (ref 20–31)
CREATININE: 1.08 mg/dL — AB (ref 0.60–0.88)
Calcium: 7.9 mg/dL — ABNORMAL LOW (ref 8.6–10.4)
Chloride: 101 mmol/L (ref 98–110)
Glucose, Bld: 149 mg/dL — ABNORMAL HIGH (ref 65–99)
POTASSIUM: 4.3 mmol/L (ref 3.5–5.3)
Sodium: 136 mmol/L (ref 135–146)

## 2016-05-08 LAB — CBC WITH DIFFERENTIAL/PLATELET
BASOS PCT: 0 %
Basophils Absolute: 0 cells/uL (ref 0–200)
Eosinophils Absolute: 207 cells/uL (ref 15–500)
Eosinophils Relative: 3 %
HCT: 26.5 % — ABNORMAL LOW (ref 35.0–45.0)
Hemoglobin: 8.3 g/dL — ABNORMAL LOW (ref 11.7–15.5)
LYMPHS PCT: 10 %
Lymphs Abs: 690 cells/uL — ABNORMAL LOW (ref 850–3900)
MCH: 28.2 pg (ref 27.0–33.0)
MCHC: 31.3 g/dL — ABNORMAL LOW (ref 32.0–36.0)
MCV: 90.1 fL (ref 80.0–100.0)
MONO ABS: 345 {cells}/uL (ref 200–950)
MONOS PCT: 5 %
MPV: 9.4 fL (ref 7.5–12.5)
NEUTROS ABS: 5658 {cells}/uL (ref 1500–7800)
Neutrophils Relative %: 82 %
PLATELETS: 551 10*3/uL — AB (ref 140–400)
RBC: 2.94 MIL/uL — AB (ref 3.80–5.10)
RDW: 15.1 % — AB (ref 11.0–15.0)
WBC: 6.9 10*3/uL (ref 3.8–10.8)

## 2016-05-08 NOTE — Progress Notes (Addendum)
Cardiology Office Note:    Date:  05/08/2016   ID:  Leslie Carr, DOB Apr 22, 1931, MRN [id number]  PCP:  Leslie Carr  Cardiologist:  Dr. Daneen Carr   Electrophysiologist:  n/a  Referring MD: Leslie Shelter, PA-C   Chief Complaint  Patient presents with  . Hospitalization Follow-up    post op AF with RVR    History of Present Illness:     Leslie Carr is a 82 y.o. female with a hx of CAD (coronary calcification on CT), diastolic HF, breast CA, recurrent pleural effusions, prior CVA.  She was admitted 4/27-5/5 for left lung pleural decortication for recurrent pleural effusions.  Hospital stay was c/b AF with RVR. She was seen by Dr. Daneen Carr.  CHADS2-VASc=7 (female, age > 79, vascular disease, CVA, CHF). She was treated with Amiodarone, Metoprolol.  Xarelto was started for anticoagulation.  She did require transfusion with PRBCs due to blood loss anemia.  EF 48% by prior echo.  She was also started on ACE inhibitor. Statin therapy was adjusted given coronary Ca on CT.    Patient was seen in FU with Leslie Pierini, PA-C with TCTS x 2 since DC.  She had LE edema and small effusions on CXR.  She was placed on Lasix.  Last seen 05/05/16.  Lasix was DC'd.  BP was low and she complained of nausea.  Lisinopril and Amiodarone was also DC'd.    Returns for FU.  Here with her daughter.  She had significant SEs to lasix, Amiodarone, Lisinopril.  She was hypotensive, weak, nauseated.  She feels better off these medications.  She is still somewhat nauseated.  She has no appetite.  Weight had gone up to 149 at discharge. This came down quickly to 134 with the Lasix. Weight has remained at 134. LE edema gets worse throughout the day and is almost resolved in the morning after laying flat. She sleeps on an incline. Denies PND. Denies chest discomfort or syncope. Stools are dark from iron. She denies melena or hematochezia.    Past Medical History  Diagnosis Date  . Breast cancer ([place])   . CHF  (congestive heart failure) ([place])   . Heart murmur   . Hypercholesteremia   . Stroke [facility]) 2014    left sided weakness - mainly left hand  . Pleural effusion on left   . Shortness of breath dyspnea     "only going up a flight of stairs"  . History of bronchitis   . Numbness and tingling in left hand     Past Surgical History  Procedure Laterality Date  . Abdominal hysterectomy  1976  . Mastectomy Left 1985  . Foot surgery Right     "bone spur removal"  . Cataract extraction Right   . Colonoscopy    . Tonsillectomy    . Video assisted thoracoscopy Left 04/17/2016    Procedure:  Left VIDEO ASSISTED THORACOSCOPY with Drainage of Pleural Effusion, Pleural Biopsy, Visceral Pleural Decortication and Placement of On-Q pain pump;  Surgeon: Leslie Nakayama, MD;  Location: [place];  Service: Thoracic;  Laterality: Left;  . Pleural biopsy Left 04/17/2016    Procedure:  Left PLEURAL BIOPSY;  Surgeon: Leslie Nakayama, MD;  Location: [facility];  Service: Thoracic;  Laterality: Left;  . Decortication Left 04/17/2016    Procedure: Visceral Pleural DECORTICATION;  Surgeon: Leslie Nakayama, MD;  Location: [facility];  Service: Thoracic;  Laterality: Left;  . Pleural effusion drainage Left 04/17/2016  Procedure: DRAINAGE OF Left PLEURAL EFFUSION;  Surgeon: Leslie Nakayama, MD;  Location: [facility];  Service: Thoracic;  Laterality: Left;    Current Medications: Outpatient Prescriptions Prior to Visit  Medication Sig Dispense Refill  . docusate sodium (COLACE) 100 MG capsule Take 100 mg by mouth every evening.    . Multiple Vitamins-Minerals (MULTIVITAMIN WITH MINERALS) tablet Take 1 tablet by mouth daily.    . pravastatin (PRAVACHOL) 20 MG tablet Take 20 mg by mouth every evening.     . Rivaroxaban (XARELTO) 15 MG TABS tablet Take 1 tablet (15 mg total) by mouth daily with supper. 30 tablet 1  . aspirin 81 MG tablet Take 81 mg by mouth every evening.     . furosemide (LASIX) 40 MG tablet Take 1  tablet (40 mg total) by mouth daily. 14 tablet 0  . lisinopril (PRINIVIL,ZESTRIL) 5 MG tablet Take 1 tablet (5 mg total) by mouth daily. 30 tablet 1  . potassium chloride (K-DUR,KLOR-CON) 20 MEQ tablet Take 0.5 tablets (10 mEq total) by mouth daily. 14 tablet 0  . amiodarone (PACERONE) 200 MG tablet Take 1 tablet (200 mg total) by mouth daily. (Patient not taking: Reported on 05/08/2016) 30 tablet 1  . ferrous sulfate 325 (65 FE) MG tablet Take 1 tablet (325 mg total) by mouth daily with breakfast. For one month then stop. (Patient not taking: Reported on 05/08/2016)  3  . metoprolol succinate (TOPROL-XL) 25 MG 24 hr tablet Take 0.5 tablets (12.5 mg total) by mouth daily. (Patient not taking: Reported on 05/08/2016) 30 tablet 1  . traMADol (ULTRAM) 50 MG tablet Take 1 tablet (50 mg total) by mouth every 6 (six) hours as needed (mild pain). (Patient not taking: Reported on 05/08/2016) 30 tablet 0   No facility-administered medications prior to visit.     Current Meds  Medication Sig  . docusate sodium (COLACE) 100 MG capsule Take 100 mg by mouth every evening.  . Multiple Vitamins-Minerals (MULTIVITAMIN WITH MINERALS) tablet Take 1 tablet by mouth daily.  . pravastatin (PRAVACHOL) 20 MG tablet Take 20 mg by mouth every evening.   . Rivaroxaban (XARELTO) 15 MG TABS tablet Take 1 tablet (15 mg total) by mouth daily with supper.  . traMADol (ULTRAM) 50 MG tablet Take 1/2 tablet by mouth every 6 hours as needed for pain  . [DISCONTINUED] aspirin 81 MG tablet Take 81 mg by mouth every evening.   . [DISCONTINUED] furosemide (LASIX) 40 MG tablet Take 1 tablet (40 mg total) by mouth daily.  . [DISCONTINUED] lisinopril (PRINIVIL,ZESTRIL) 5 MG tablet Take 1 tablet (5 mg total) by mouth daily.  . [DISCONTINUED] potassium chloride (K-DUR,KLOR-CON) 20 MEQ tablet Take 0.5 tablets (10 mEq total) by mouth daily.     Allergies:   Lipitor   Social History   Social History  . Marital Status: Divorced    Spouse  Name: N/A  . Number of Children: N/A  . Years of Education: N/A   Social History Main Topics  . Smoking status: Former Research scientist (life sciences)  . Smokeless tobacco: Never Used     Comment: Quit over 40 years ago  . Alcohol Use: No  . Drug Use: No  . Sexual Activity: Not Asked   Other Topics Concern  . None   Social History Narrative     Family History:  The patient's family history is negative for Heart disease, Heart attack, Hypertension, Hyperlipidemia, and Heart failure.   ROS:   Please see the history of present illness.  Review of Systems  Cardiovascular: Positive for dyspnea on exertion and leg swelling.  Respiratory: Positive for cough.   Gastrointestinal: Positive for nausea.   All other systems reviewed and are negative.   Physical Exam:    VS:  BP 98/40 mmHg  Pulse 84  Ht 5\' 1"  (1.549 m)  Wt 134 lb 12.8 oz (61.145 kg)  BMI 25.48 kg/m2   GEN: Well nourished, well developed, in no acute distress HEENT: normal Neck: no JVD, no masses Cardiac: Normal 99991111, RRR; 2/6 systolic murmur RUSB, no rubs, or gallops, 1-2+ bilat LE edema;     Respiratory:  Decreased breath sounds bilaterally; no wheezing, rhonchi or rales GI: soft, nontender, nondistended MS: no deformity or atrophy Skin: warm and dry Neuro: No focal deficits  Psych: Alert and oriented x 3, normal affect  Wt Readings from Last 3 Encounters:  05/08/16 134 lb 12.8 oz (61.145 kg)  05/05/16 135 lb (61.236 kg)  04/28/16 149 lb (67.586 kg)      Studies/Labs Reviewed:     EKG:  EKG is  ordered today.  The ekg ordered today demonstrates NSR, HR 84  Recent Labs: 04/17/2016: Magnesium 1.3* 04/19/2016: ALT 14 04/20/2016: TSH 2.961 04/24/2016: BUN 12; Creatinine, Ser 0.92; Hemoglobin 8.9*; Platelets 370; Potassium 3.8; Sodium 136   Recent Lipid Panel No results found for: CHOL, TRIG, HDL, CHOLHDL, VLDL, LDLCALC, LDLDIRECT  Additional studies/ records that were reviewed today include:   Dg Chest 2 View    05/05/2016 IMPRESSION: Persistent small bilateral pleural effusions. Persistent focal parenchymal opacity in the right mid to lower lung. Slightly improved interstitial markings in the left mid and lower lung. Electronically Signed   By: David  Martinique M.D.   On: 05/05/2016 13:45   Chest CTA 3/17 IMPRESSION: 1. Large left pleural effusion with extensive pleural thickening and enhancement, concerning for a malignant pleural effusion. Correlation with thoracentesis is suggested. This is associated with extensive passive atelectasis in the left lung, as discussed above. 2. New mass-like area in the lateral segment of the right middle lobe measuring 1.7 x 3.1 x 1.7 cm. Differential considerations include a primary bronchogenic neoplasm, focus of infection/inflammation, or a new pulmonary metastasis. Correlation with PET-CT and/or biopsy should be considered if clinically appropriate. 3. Atherosclerosis, including 2 vessel coronary artery disease. 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 Recommend followup by ultrasound in 3 years. This recommendation follows ACR consensus guidelines: White Paper of the ACR Incidental Findings Committee II on Vascular Findings. J Am Coll Radiol 2013; 10:789-794. 4. Additional incidental findings, as above.  Echo 03/07/16 Moderate concentric LVH, EF 48%, diffuse HK, grade 1 diastolic dysfunction, right and left pleural effusion     ASSESSMENT:     1. PAF (paroxysmal atrial fibrillation) ([place])   2. Chronic combined systolic and diastolic CHF (congestive heart failure) ([facility])   3. Abdominal aortic aneurysm (AAA) without rupture ([place])   4. Breast cancer, stage 2, left ([place])   5. History of pleural effusion   6. Coronary artery disease involving native coronary artery of native heart without angina pectoris   7. Protein calorie  malnutrition ([place])   8. Anemia, unspecified anemia type     PLAN:     In order of problems listed above:  1. PAF - She is maintaining normal sinus rhythm. She could not tolerate amiodarone secondary to significant nausea. She has significant stroke  risk and will remain on Xarelto for stroke prophylaxis. Creatinine clearance is 45.24 cc/m. She should remain on Xarelto 15 mg daily. It is quite possible that her atrial fibrillation was exacerbated by her surgery. At this point, I am not certain that a different antiarrhythmic should be considered, especially with her significant side effects. At this point, I'm not sure that an event monitor would be helpful. Hopefully, after her blood pressure improves, we can resume low-dose beta blocker.  2. Chronic Combined Systolic and Diastolic CHF - Recent echocardiogram with mildly depressed LV function with an EF of 48%. She does have chronic calcification on CT scan. She also has mild diastolic dysfunction. She does have evidence of edema that is likely related to malnutrition. She did have residual effusions on her chest x-ray.  However, these were small. She could not tolerate Lasix. Her weight has remained stable. Obtain follow-up BMET today.  As BP improves, consider trial of beta blocker and ACE inhibitor again.    3. AAA - 3.5 x 3.2 cm by CTA in 3/17.  Rec is to repeat abdominal US in 3 years.  4. Breast CA - FU with oncology as planned. Of note her breast cancer was over 20 years ago. Pathology from recent lung decortication was negative for malignancy.  5. Recurrent pleural effusion - s/p Decortication procedure.  FU with TCTS as planned.  6. CAD - As noted, coronary Ca noted on CT.  Med Rx was initiated in the hospital with beta-blocker, ACE inhibitor, statin.  She could not tolerate beta blocker, ACE inhibitor due to hypotension and weakness. Continue statin. She does not need to be on aspirin as she is now on Xarelto. DC aspirin. She does not have  angina. At some point, we may want to consider stress testing for risk stratification.  7. Protein calorie malnutrition - Her albumin was 2.0 in 4/17. She also significant blood loss during her surgery. I suspect her LE edema is mainly related to this. We discussed the importance of keeping her legs elevated as well as increasing protein in her diet.   8. Anemia - Given her significant weakness that continues as well as anticoagulation therapy, obtain follow-up BMET, CBC.   Medication Adjustments/Labs and Tests Ordered: Current medicines are reviewed at length with the patient today.  Concerns regarding medicines are outlined above.  Medication changes, Labs and Tests ordered today are outlined in the Patient Instructions noted below. Patient Instructions  Medication Instructions:  1. STOP ASPIRIN Labwork: TODAY BMET, CBC W/DIFF Testing/Procedures: NONE Follow-Up: [employer], [facility] 05/23/16 @ 10:45  Any Other Special Instructions Will Be Listed Below (If Applicable). If you need a refill on your cardiac medications before your next appointment, please call your pharmacy.    Signed, Richardson Dopp, PA-C  05/08/2016 2:25 PM    [facility] [place], [place], [place]  [address] Phone: [phone]; Fax: [phone]

## 2016-05-08 NOTE — Patient Instructions (Addendum)
Medication Instructions:  1. STOP ASPIRIN Labwork: TODAY BMET, CBC W/DIFF Testing/Procedures: NONE Follow-Up: [employer], [facility] 05/23/16 @ 10:45  Any Other Special Instructions Will Be Listed Below (If Applicable). If you need a refill on your cardiac medications before your next appointment, please call your pharmacy.

## 2016-05-09 ENCOUNTER — Telehealth: Payer: Self-pay | Admitting: *Deleted

## 2016-05-09 MED ORDER — FERROUS SULFATE 325 (65 FE) MG PO TABS
325.0000 mg | ORAL_TABLET | Freq: Two times a day (BID) | ORAL | Status: DC
Start: 2016-05-09 — End: 2016-07-15

## 2016-05-09 NOTE — Telephone Encounter (Signed)
Pt notified of lab results and findings by phone with verbal understanding. Pt advised to increase FE 325 mg BID x 30months. I will fax results to PCP, Pt aware to f/u w/PCP in regards to anemia. Pt agreeable to plan of care.

## 2016-05-16 ENCOUNTER — Other Ambulatory Visit: Payer: Self-pay | Admitting: Thoracic Surgery (Cardiothoracic Vascular Surgery)

## 2016-05-16 DIAGNOSIS — J90 Pleural effusion, not elsewhere classified: Secondary | ICD-10-CM

## 2016-05-20 ENCOUNTER — Ambulatory Visit
Admission: RE | Admit: 2016-05-20 | Discharge: 2016-05-20 | Disposition: A | Discharge status: Home / Self Care | Payer: Medicare Other | Source: Ambulatory Visit | Attending: Thoracic Surgery (Cardiothoracic Vascular Surgery) | Admitting: Thoracic Surgery (Cardiothoracic Vascular Surgery)

## 2016-05-20 ENCOUNTER — Encounter: Payer: Self-pay | Admitting: Physician Assistant

## 2016-05-20 ENCOUNTER — Ambulatory Visit (INDEPENDENT_AMBULATORY_CARE_PROVIDER_SITE_OTHER): Payer: Self-pay | Admitting: Thoracic Surgery (Cardiothoracic Vascular Surgery)

## 2016-05-20 VITALS — BP 114/64 | HR 83 | Resp 16 | Ht 63.0 in | Wt 124.6 lb

## 2016-05-20 DIAGNOSIS — J90 Pleural effusion, not elsewhere classified: Secondary | ICD-10-CM

## 2016-05-20 DIAGNOSIS — Z09 Encounter for follow-up examination after completed treatment for conditions other than malignant neoplasm: Secondary | ICD-10-CM

## 2016-05-20 NOTE — Progress Notes (Signed)
[address]       [place] [address]             [phone]       HPI: Leslie Carr returns today for scheduled postoperative follow-up visit.  She is an 82 year old woman who was found to have a left pleural effusion. She underwent a left VATS for drainage of the effusion and decortication of the trapped lung on 04/17/2016. This was converted to a thoracotomy due to bleeding. Her postoperative course was complicated by atrial fibrillation. She was started on amiodarone and converted to sinus rhythm. She was discharged on Xarelto.  She was having a lot of issues with swelling initially and was treated with Lasix. That has resolved and she is no longer on Lasix. She also was having a lot of nausea and complained food tasted bad. Her amiodarone was discontinued. She says that helps but she still has problems with abdominal cramping feeling nauseated even sometimes just seeing food. She does have some incisional pain but is only taking pain medication at night before she goes to bed.  Past Medical History  Diagnosis Date  . Breast cancer ([place])   . CHF (congestive heart failure) ([place])   . Heart murmur   . Hypercholesteremia   . Stroke [facility]) 2014    left sided weakness - mainly left hand  . Pleural effusion on left   . Shortness of breath dyspnea     "only going up a flight of stairs"  . History of bronchitis   . Numbness and tingling in left hand       Current Outpatient Prescriptions  Medication Sig Dispense Refill  . docusate sodium (COLACE) 100 MG capsule Take 100 mg by mouth every evening.    . ferrous sulfate 325 (65 FE) MG tablet Take 1 tablet (325 mg total) by mouth 2 (two) times daily with a meal. 60 tablet 11  . Multiple Vitamins-Minerals (MULTIVITAMIN WITH MINERALS) tablet Take 1 tablet by mouth daily.    . pravastatin (PRAVACHOL) 20 MG tablet Take 20 mg by mouth every evening.     . Rivaroxaban (XARELTO) 15 MG TABS tablet Take 1 tablet (15 mg  total) by mouth daily with supper. 30 tablet 1  . traMADol (ULTRAM) 50 MG tablet Take 1/2 tablet by mouth every 6 hours as needed for pain     No current facility-administered medications for this visit.    Physical Exam BP 114/64 mmHg  Pulse 83  Resp 16  Ht 5\' 3"  (1.6 m)  Wt 124 lb 9.6 oz (56.518 kg)  BMI 22.08 kg/m2  SpO66 88% 82 year old woman in no acute distress Alert and oriented 3 with no focal deficits Incisions healing well Cardiac regular rate and rhythm normal S1 and S2 2/6 systolic murmur Lungs mildly diminished left base, otherwise clear  Diagnostic Tests: CHEST 2 VIEW  COMPARISON: 05/05/2016  FINDINGS: Cardiac shadow is stable. Aortic calcifications are again seen. Improved aeration is noted in the left lung base. Chronic blunting of the left costophrenic angle is noted. Areas of prior left rib fracture are again seen and stable. A a somewhat ovoid density again is identified in the right middle lobe similar to that seen on the prior examination. No new focal sizable effusion is seen. No acute bony abnormality is noted.  IMPRESSION: Chronic changes bilaterally stable from the previous exam. Small left pleural effusion remains although unchanged.  Improved aeration in the left base. Stable left seventh  rib fracture is seen.   Electronically Signed  By: Inez Catalina M.D.  On: 05/20/2016 12:47  I personally reviewed the CXR and concur with plans noted above.  Impression: 82 year old woman who is now about a month out from a left thoracotomy, drainage of pleural effusion and decortication. Her progress has been slow. That is not surprising given her age, intraoperative bleeding, and postoperative atrial fibrillation.  Her incisional pain is recently well controlled. She is only using pain medication at night to help her sleep.  She was anemic. She is currently on iron twice daily. She stopped one of the iron tablets because of issues with  constipation and abdominal cramping. I think that may be contributing to a lot of or GI complaints and recommended she stop the iron altogether. It will take longer for her anemia to correct, but it should improve over time.  She remains on Xarelto due to high CHADS VASC score  Plan: Stop iron  I'll see her back in one month to check on her progress.  Melrose Nakayama, MD [facility] [phone]

## 2016-05-23 ENCOUNTER — Ambulatory Visit: Payer: Medicare Other | Admitting: Physician Assistant

## 2016-06-03 DIAGNOSIS — D62 Acute posthemorrhagic anemia: Secondary | ICD-10-CM | POA: Diagnosis not present

## 2016-06-03 DIAGNOSIS — E7800 Pure hypercholesterolemia, unspecified: Secondary | ICD-10-CM | POA: Diagnosis not present

## 2016-06-03 DIAGNOSIS — R251 Tremor, unspecified: Secondary | ICD-10-CM | POA: Diagnosis not present

## 2016-06-16 ENCOUNTER — Other Ambulatory Visit: Payer: Self-pay | Admitting: Thoracic Surgery (Cardiothoracic Vascular Surgery)

## 2016-06-16 DIAGNOSIS — J90 Pleural effusion, not elsewhere classified: Secondary | ICD-10-CM

## 2016-06-17 ENCOUNTER — Other Ambulatory Visit: Payer: Self-pay | Admitting: *Deleted

## 2016-06-17 ENCOUNTER — Encounter: Payer: Self-pay | Admitting: Thoracic Surgery (Cardiothoracic Vascular Surgery)

## 2016-06-17 ENCOUNTER — Ambulatory Visit (INDEPENDENT_AMBULATORY_CARE_PROVIDER_SITE_OTHER): Payer: Medicare Other | Admitting: Thoracic Surgery (Cardiothoracic Vascular Surgery)

## 2016-06-17 ENCOUNTER — Ambulatory Visit
Admission: RE | Admit: 2016-06-17 | Discharge: 2016-06-17 | Disposition: A | Discharge status: Home / Self Care | Payer: Medicare Other | Source: Ambulatory Visit | Attending: Thoracic Surgery (Cardiothoracic Vascular Surgery) | Admitting: Thoracic Surgery (Cardiothoracic Vascular Surgery)

## 2016-06-17 VITALS — BP 114/71 | HR 96 | Resp 20 | Ht 63.0 in | Wt 124.0 lb

## 2016-06-17 DIAGNOSIS — Z09 Encounter for follow-up examination after completed treatment for conditions other than malignant neoplasm: Secondary | ICD-10-CM | POA: Diagnosis not present

## 2016-06-17 DIAGNOSIS — R911 Solitary pulmonary nodule: Secondary | ICD-10-CM

## 2016-06-17 DIAGNOSIS — J90 Pleural effusion, not elsewhere classified: Secondary | ICD-10-CM

## 2016-06-17 NOTE — Progress Notes (Signed)
[address]       [place] [address]             [phone]       HPI: Leslie Carr returns today for scheduled follow-up visit.  She is an 82 year old woman who had a left VATS and decortication for a chronic pleural effusion on 04/17/2016. Pathology showed inflammation. There was no malignancy. During the decortication there was bleeding which necessitated conversion to an open thoracotomy.  She still has some aching pains in her left chest. She's taking Tylenol for that. She complains of a tremor the gets worse during the course of the day.  When I last saw her she was having a lot of GI complaints. We stopped her iron, but apparently she is back on that.   She says her exercise tolerance is improving slowly.  Past Medical History  Diagnosis Date  . Breast cancer ([place])   . CHF (congestive heart failure) ([place])   . Heart murmur   . Hypercholesteremia   . Stroke [facility]) 2014    left sided weakness - mainly left hand  . Pleural effusion on left   . Shortness of breath dyspnea     "only going up a flight of stairs"  . History of bronchitis   . Numbness and tingling in left hand       Current Outpatient Prescriptions  Medication Sig Dispense Refill  . docusate sodium (COLACE) 100 MG capsule Take 100 mg by mouth every evening.    . ferrous sulfate 325 (65 FE) MG tablet Take 1 tablet (325 mg total) by mouth 2 (two) times daily with a meal. 60 tablet 11  . Multiple Vitamins-Minerals (MULTIVITAMIN WITH MINERALS) tablet Take 1 tablet by mouth daily.    . pravastatin (PRAVACHOL) 20 MG tablet Take 20 mg by mouth every evening.     . Rivaroxaban (XARELTO) 15 MG TABS tablet Take 1 tablet (15 mg total) by mouth daily with supper. 30 tablet 1   No current facility-administered medications for this visit.    Physical Exam BP 114/71 mmHg  Pulse 96  Resp 20  Ht 5\' 3"  (1.6 m)  Wt 124 lb (56.246 kg)  BMI 21.97 kg/m2  SpO6 42% 82 year old woman in no acute  distress Alert and oriented 3 with mild tremor Incisions well healed Lungs slightly diminished at left base otherwise clear Cardiac regular rate and rhythm with 2/6 systolic murmur  Diagnostic Tests: CHEST 2 VIEW  COMPARISON: PA and lateral chest x-ray of March 20, 2016. A PET-CT study of March 26, 2016 was reviewed.  FINDINGS: The right lung is well-expanded. There is stable ovoid approximately 2 x 3 cm alveolar density in the middle lobe consistent with a known pulmonary parenchymal nodule. There is no right pleural effusion. On the left there is a small pleural effusion blunting the costophrenic angles. It appears stable since the previous study. The heart is normal in size. The pulmonary vascularity is not engorged. There is dense calcification in the wall of the thoracic aorta. There is mild multilevel degenerative disc disease of the thoracic spine.  IMPRESSION: Interval resolution of the small right pleural effusion. Stable small left pleural effusion. Stable pulmonary parenchymal nodule in the right middle lobe.   Electronically Signed  By: David Martinique M.D.  On: 06/17/2016 09:58  I personally reviewed the chest x-ray and concur with the findings as noted above.  Impression: Leslie Carr is an 82 year old woman who had a  left thoracotomy and decortication for chronic pleural effusion. She had be converted from VATS to an open thoracotomy for bleeding intraoperatively. She did have a rib fracture with the conversion.  She currently is doing reasonably well from that. Her exercise tolerance is improved from preop. She does still have some pain but is not requiring any narcotics. She's only taking Tylenol occasionally.  Her chest x-ray today shows a good postoperative result on the left chest. There is a persistent right middle lobe density but does not appear to have changed in size. This was present on her PET/CT and was mildly hypermetabolic. Given that it is  now resolving I do think we need to further investigate that. I recommended to her that we do a repeat CT chest. Unless is getting smaller I think the next step would be a bronchoscopic biopsy. She is not anxious to have any procedures or interventions done. But she does agree to go ahead with CT and we'll discuss it at that time.  Plan: Return in one month with CT chest  Melrose Nakayama, MD [facility] [phone]

## 2016-07-15 ENCOUNTER — Ambulatory Visit
Admission: RE | Admit: 2016-07-15 | Discharge: 2016-07-15 | Disposition: A | Discharge status: Home / Self Care | Payer: Medicare Other | Source: Ambulatory Visit | Attending: Thoracic Surgery (Cardiothoracic Vascular Surgery) | Admitting: Thoracic Surgery (Cardiothoracic Vascular Surgery)

## 2016-07-15 ENCOUNTER — Ambulatory Visit (INDEPENDENT_AMBULATORY_CARE_PROVIDER_SITE_OTHER): Payer: Self-pay | Admitting: Thoracic Surgery (Cardiothoracic Vascular Surgery)

## 2016-07-15 VITALS — BP 160/80 | HR 85 | Resp 20 | Ht 63.0 in | Wt 124.0 lb

## 2016-07-15 DIAGNOSIS — R911 Solitary pulmonary nodule: Secondary | ICD-10-CM

## 2016-07-15 NOTE — Progress Notes (Signed)
[address]       [place] [address]             [phone]       HPI: Leslie Carr returns to discuss results of her recent CT chest  She is an 82 year old woman who had a left VATS and decortication for a chronic pleural effusion on 04/17/2016. Pathology showed inflammation. There was no malignancy. During the decortication there was bleeding which necessitated conversion to an open thoracotomy.  I last saw her in June. At that time she was making progress but still having some significant incisional pain.  She continues to have pain in her incision. She is taking Tylenol 1 tablet once a day for that. She feels pins and needles type sensation frequently and also has an aching soreness. Her breathing has improved since prior to surgery. She is walking about a mile a day.  I saw her in June we discussed repeating a CT scan to follow-up her right lung nodule. That was mildly hypermetabolic on PET CT back in April.  Past Medical History:  Diagnosis Date  . Breast cancer ([place])   . CHF (congestive heart failure) ([place])   . Heart murmur   . History of bronchitis   . Hypercholesteremia   . Numbness and tingling in left hand   . Pleural effusion on left   . Shortness of breath dyspnea    "only going up a flight of stairs"  . Stroke [facility]) 2014   left sided weakness - mainly left hand     Current Outpatient Prescriptions  Medication Sig Dispense Refill  . docusate sodium (COLACE) 100 MG capsule Take 100 mg by mouth every evening.    . Multiple Vitamins-Minerals (MULTIVITAMIN WITH MINERALS) tablet Take 1 tablet by mouth daily.    . pravastatin (PRAVACHOL) 20 MG tablet Take 20 mg by mouth every evening.     . Rivaroxaban (XARELTO) 15 MG TABS tablet Take 1 tablet (15 mg total) by mouth daily with supper. 30 tablet 1   No current facility-administered medications for this visit.     Physical Exam BP (!) 160/80 (BP Location: Right Arm, Patient Position:  Sitting, Cuff Size: Small)   Pulse 85   Resp 20   Ht 5\' 3"  (1.6 m)   Wt 124 lb (56.2 kg)   BMI 21.73 kg/m  82 year old woman in no acute distress Alert and oriented 3 with no focal deficits Incisions well healed Lungs with equal breath sounds bilaterally  Diagnostic Tests: CT CHEST WITHOUT CONTRAST TECHNIQUE: Multidetector CT imaging of the chest was performed following the standard protocol without IV contrast. COMPARISON:  Chest x-ray of 06/17/2016 and CT chest of 03/04/2016 FINDINGS: Cardiovascular: Heart size is stable. Calcification again is noted in the distribution of the left anterior descending circumflex artery. The mid ascending thoracic aorta measures 44 mm in diameter which is unchanged. Extensive thoracic aortic atherosclerotic change remains. Mediastinum/Nodes: On this unenhanced study, no mediastinal or hilar adenopathy is seen with small mediastinal lymph nodes remaining stable. Lungs/Pleura: On lung window images, there are air irregularly marginated partially peripherally calcified nodular lesion within the right middle lobe is unchanged, measuring 26 x 17 x 16 mm. No new parenchymal nodule is seen. A small left pleural effusion is noted. Chronic changes remain within the partially collapsed left upper lobe anteromedially with bronchiectatic change present. A small calcified granuloma is present within the right lower lobe on image 68. The central airway is  patent. A small left pleural effusion is noted. Upper Abdomen: There is a small calcification within the spleen consistent with prior granulomatous disease. The remainder of the upper abdomen that is visualized is unremarkable. Musculoskeletal: Degenerative changes are present within both shoulders and throughout the thoracic spine. The thoracic vertebrae are in normal alignment with no compression deformity noted. IMPRESSION: 1. Stable irregularly marginated peripherally calcified nodule within the  right middle lobe. Continued followup is recommended, with CT of the chest in 3-6 months. 2. Aneurysmal dilatation of the mid ascending thoracic aorta measuring 44 mm in diameter. Recommend annual imaging followup by CTA or MRA. This recommendation follows 2010 ACCF/AHA/AATS/ACR/ASA/SCA/SCAI/SIR/STS/SVM Guidelines for the Diagnosis and Management of Patients with Thoracic Aortic Disease. Circulation. 2010; 121: [id number] . 3. Small left pleural effusion. 4. Extensive thoracic aortic atherosclerotic change. Electronically Signed   By: Ivar Drape M.D.   On: 07/15/2016 11:34 I personally reviewed the CT chest and concur with findings as noted above.  Impression: [name] is an 82 year old woman who had a chronic inflammatory pleural effusion that required a left thoracotomy and decortication in April. She has a right middle lobe nodule that was mildly hypermetabolic on PET back in April. When I last saw her we discussed possible bronchoscopy or CT-guided biopsy. She did not want to do either of those. Her CT today shows no change in the nodule over the past 4 months. We again discussed the possibility of biopsy versus continued radiographic follow-up. We discussed the relative advantages and disadvantages of each of those approaches. I also mentioned that surgical resection would be feasible. She absolutely does not want surgery or a biopsy this time. We will plan to continue with radiographic follow-up with a CT in 3 months.  She does have an ascending aortic aneurysm which will need continued follow-up. He currently is 4.4 cm.  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  Plan: Return in 3 months with CT chest  Melrose Nakayama, MD [facility] [phone]

## 2016-09-11 ENCOUNTER — Other Ambulatory Visit: Payer: Self-pay | Admitting: Thoracic Surgery (Cardiothoracic Vascular Surgery)

## 2016-09-11 DIAGNOSIS — R911 Solitary pulmonary nodule: Secondary | ICD-10-CM

## 2016-10-07 ENCOUNTER — Encounter: Payer: Self-pay | Admitting: Thoracic Surgery (Cardiothoracic Vascular Surgery)

## 2016-10-07 ENCOUNTER — Ambulatory Visit
Admission: RE | Admit: 2016-10-07 | Discharge: 2016-10-07 | Disposition: A | Discharge status: Home / Self Care | Payer: Medicare Other | Source: Ambulatory Visit | Attending: Thoracic Surgery (Cardiothoracic Vascular Surgery) | Admitting: Thoracic Surgery (Cardiothoracic Vascular Surgery)

## 2016-10-07 ENCOUNTER — Ambulatory Visit (INDEPENDENT_AMBULATORY_CARE_PROVIDER_SITE_OTHER): Payer: Medicare Other | Admitting: Thoracic Surgery (Cardiothoracic Vascular Surgery)

## 2016-10-07 VITALS — BP 144/82 | HR 76 | Resp 16 | Ht 63.0 in | Wt 127.0 lb

## 2016-10-07 DIAGNOSIS — Z09 Encounter for follow-up examination after completed treatment for conditions other than malignant neoplasm: Secondary | ICD-10-CM | POA: Diagnosis not present

## 2016-10-07 DIAGNOSIS — D381 Neoplasm of uncertain behavior of trachea, bronchus and lung: Secondary | ICD-10-CM

## 2016-10-07 DIAGNOSIS — I712 Thoracic aortic aneurysm, without rupture, unspecified: Secondary | ICD-10-CM

## 2016-10-07 DIAGNOSIS — R911 Solitary pulmonary nodule: Secondary | ICD-10-CM | POA: Diagnosis not present

## 2016-10-07 DIAGNOSIS — I639 Cerebral infarction, unspecified: Secondary | ICD-10-CM | POA: Diagnosis not present

## 2016-10-07 DIAGNOSIS — J90 Pleural effusion, not elsewhere classified: Secondary | ICD-10-CM

## 2016-10-07 NOTE — Progress Notes (Signed)
[address]       [place], [address]             [phone]       HPI: Leslie Carr returns for a scheduled 3 month follow-up visit.  She is an 82 year old woman who had a left VATS decortication for chronic pleural effusion in April. During the procedure there was significant bleeding which necessitated conversion to an open thoracotomy. Pathology showed inflammation. There was no evidence of malignancy. She also has a right middle lobe nodule and some calcified hilar lymph nodes that we are following. She also has a 4.5 cm ascending aneurysm.  She still has some pain related to her thoracotomy. She's not taking anything for that. She has not had any problems with her breathing. Her appetite is good and her weight is stable.  Past Medical History:  Diagnosis Date  . Breast cancer ([place])   . CHF (congestive heart failure) ([place])   . Heart murmur   . History of bronchitis   . Hypercholesteremia   . Numbness and tingling in left hand   . Pleural effusion on left   . Shortness of breath dyspnea    "only going up a flight of stairs"  . Stroke [facility]) 2014   left sided weakness - mainly left hand     Current Outpatient Prescriptions  Medication Sig Dispense Refill  . docusate sodium (COLACE) 100 MG capsule Take 100 mg by mouth every evening.    . Multiple Vitamins-Minerals (MULTIVITAMIN WITH MINERALS) tablet Take 1 tablet by mouth daily.    . pravastatin (PRAVACHOL) 20 MG tablet Take 20 mg by mouth every evening.      No current facility-administered medications for this visit.     Physical Exam BP (!) 144/82   Pulse 76   Resp 16   Ht 5\' 3"  (1.6 m)   Wt 127 lb (57.6 kg)   SpO2 93% Comment: ON RA  BMI 22.35 kg/m  82 year old woman in no acute distress Alert and oriented 3 with no focal deficits No cervical or supraclavicular adenopathy Cardiac regular rate and rhythm with 2/6 systolic murmur Lungs slightly diminished at left base, otherwise  clear Limited range of motion left shoulder  Diagnostic Tests: CT CHEST WITHOUT CONTRAST  TECHNIQUE: Multidetector CT imaging of the chest was performed following the standard protocol without IV contrast.  COMPARISON:  07/15/2016  FINDINGS: Cardiovascular: Ascending aortic aneurysm measures 4.4 cm in maximal diameter, unchanged. Diffuse thoracic aortic atherosclerosis is again noted. Left anterior descending and right coronary artery calcification. Normal heart size. No pericardial effusion.  Mediastinum/Nodes: Coarsely calcified 1.2 cm left thyroid nodule, unchanged. No enlarged axillary, mediastinal, or hilar lymph nodes identified.  Lungs/Pleura: Small left pleural effusion and mild left-sided pleural thickening are unchanged. The irregular, partially calcified 2.6 x 1.7 cm nodule in the right middle lobe is unchanged. Small calcified nodule in the right lower lobe is unchanged (series 4, image 87), as is a 3 mm noncalcified right upper lobe nodule (series 4, image 19). The left upper lobe remains partially collapsed medially with bronchiectasis, unchanged. Bronchiectasis is also noted inferiorly in the lingula. Atelectasis is present in the basilar left lower lobe.  Upper Abdomen: Small calcified granuloma in the spleen. 3.4 cm infrarenal abdominal aortic aneurysm is unchanged from the prior PET-CT.  Musculoskeletal: Advanced left glenohumeral degenerative change. Multilevel thoracic disc degeneration with chronic calcified disc extrusion at T9-10.  IMRESSION: 1. Unchanged 2.6 cm right middle  lobe nodule. 2. Unchanged small left pleural effusion and pleural thickening. 3. Unchanged 4.4 cm ascending aortic aneurysm and 3.4 cm infrarenal abdominal aortic aneurysm.   Electronically Signed   By: Logan Bores M.D.   On: 10/07/2016 12:11 I personally reviewed the CT chest findings noted above  Impression: Leslie Carr is an 82 year old woman who had a  complicated left pleural effusion. She had a left VATS which was converted to a thoracotomy due to bleeding during a decortication back in April. She had a good result with that and has not had any recurrent effusion.  Right middle lobe nodule- this is been present dating back to March. It is unchanged over that time frame. It was very mildly hypermetabolic on PET/CT. It most likely is infectious or inflammatory in nature, but the possibility of cancer cannot be ruled out. She her daughter well aware of that possibility. We again discussed the option of bronchoscopic biopsy. She has no interest in that. We will plan to see her back with a CT in 6 months to follow up on that problem.  Ascending aneurysm- 4.5 cm with concentric calcification "porcelain aorta." Stable. Will need continued follow-up. We'll reassess on her 6 month CTA.  Plan: Return in 6 months with CT chest  Melrose Nakayama, MD [facility] [phone]

## 2016-10-20 IMAGING — PT NM PET TUM IMG INITIAL (PI) SKULL BASE T - THIGH
8 series · 25 of 25 positions shown · non-contrast
Comparison: CT scans 03/04/2016

CLINICAL DATA: Subsequent treatment strategy for breast cancer.
Remote history of breast cancer with large left pleural effusion.

EXAM:
NUCLEAR MEDICINE PET SKULL BASE TO THIGH
TECHNIQUE: 6.5 mCi F-18 FDG was injected intravenously. Full-ring PET imaging
was performed from the skull base to thigh after the radiotracer. CT
data was obtained and used for attenuation correction and anatomic
localization.
FASTING BLOOD GLUCOSE:  Value: 91 mg/dl

[Series 4: ct sk_thigh 5.0 b31f · axial · 5.0mm · 0.84mm/px · z∈[-742,+46]mm · 4 of 198 slices shown]
[im 1/198]
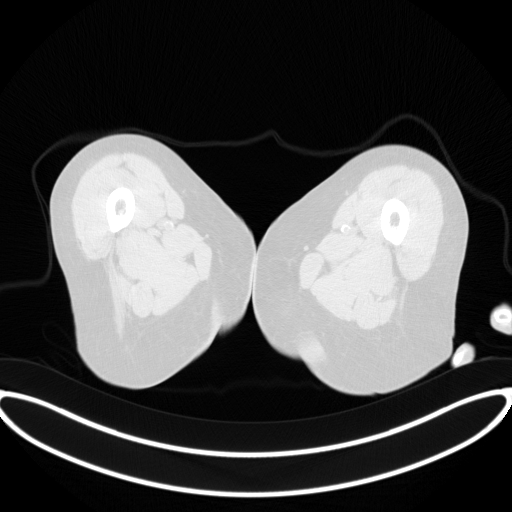
[im 66/198]
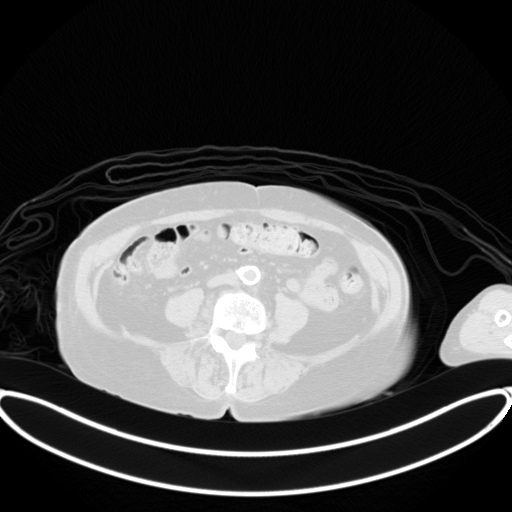
[im 132/198]
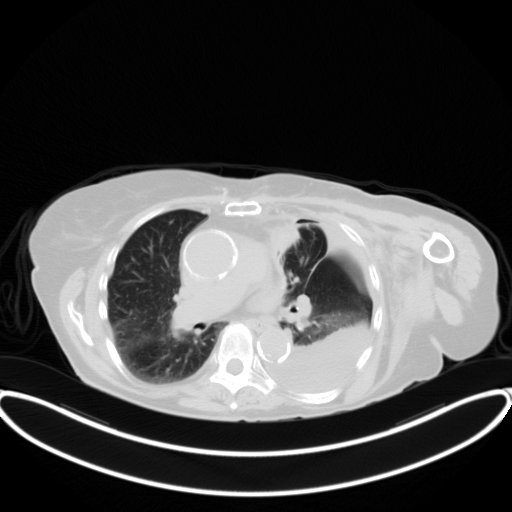
[im 198/198  brain]
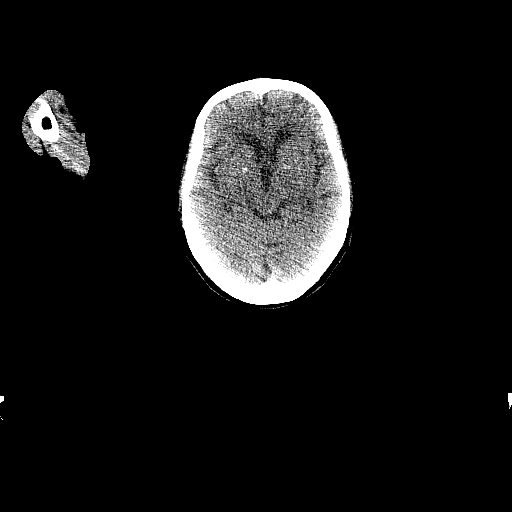

[Series 6: pet sk_thigh ac · axial · 5.0mm · 4.07mm/px · z∈[-742,+46]mm · 5 of 198 slices shown]
[im 1/198]
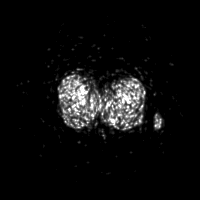
[im 50/198]
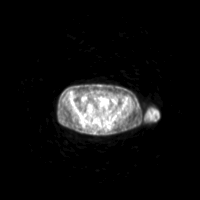
[im 99/198]
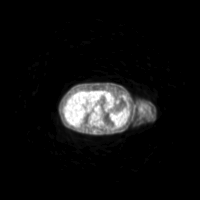
[im 148/198]
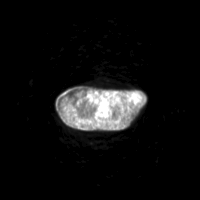
[im 198/198]
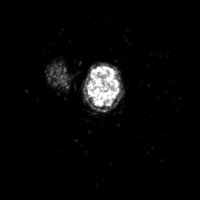

[Series 7: ct sk_thigh 5.0 b70f lung_bone · axial · 5.0mm · 0.62mm/px · z∈[-325,-93]mm · 2 of 59 slices shown]
[im 1/59  bone]
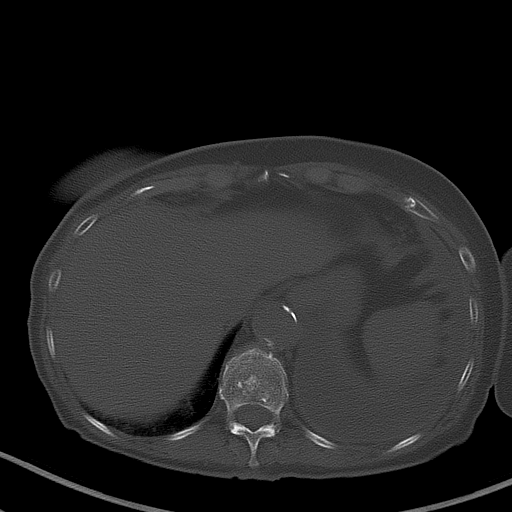
[im 59/59  bone]
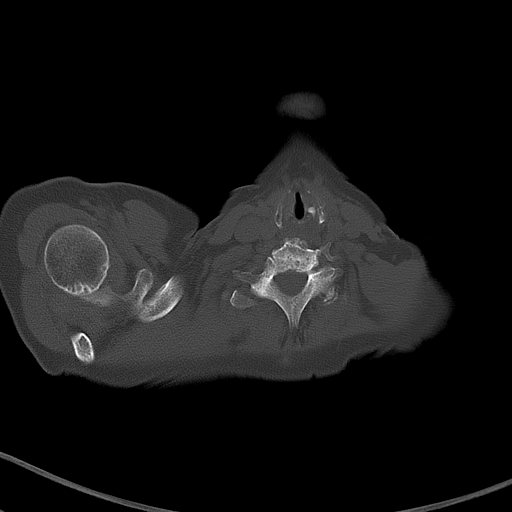

[Series 9: pet sk_thigh nac · axial · 5.0mm · 4.07mm/px · z∈[-742,+46]mm · 5 of 198 slices shown]
[im 1/198]
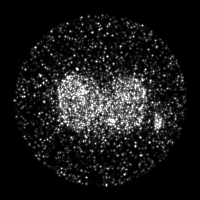
[im 50/198]
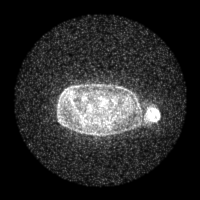
[im 99/198]
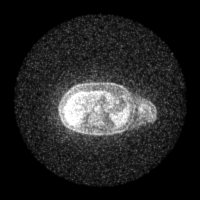
[im 148/198]
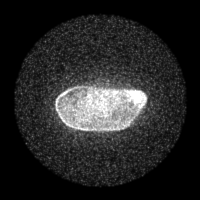
[im 198/198]
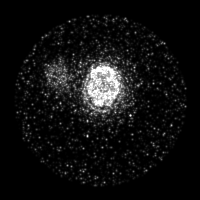

[Series 603: mip collection<mip range> · coronal · 1.68mm/px · 1 of 32 slices shown]
[im 1/32]
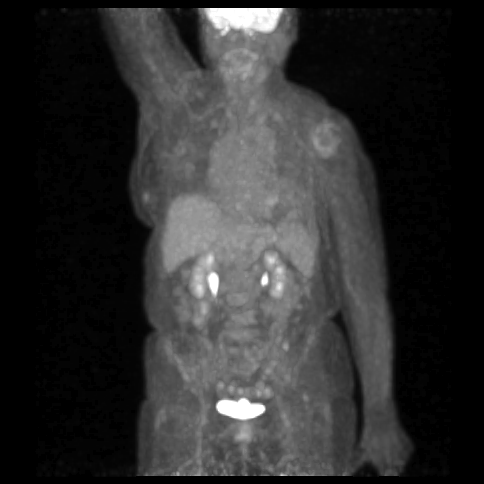

[Series 604: range-ct sk_thigh 5.0 (id)<alpha range> · 2 of 57 slices shown (1 of 2)]
[im 1/57]
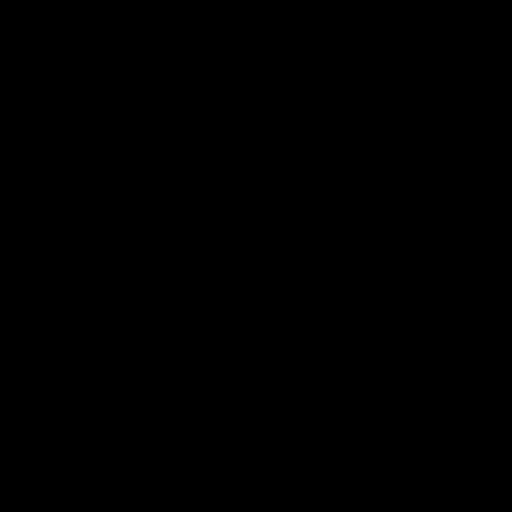
[im 57/57]
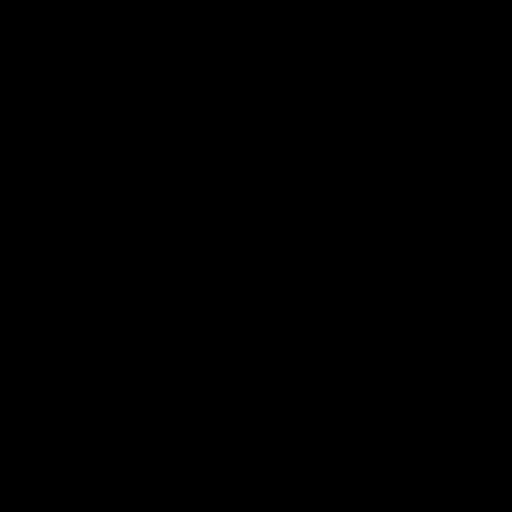

[Series 605: range-ct sk_thigh 5.0 (id)<alpha range> · 5 of 187 slices shown (2 of 2)]
[im 1/187]
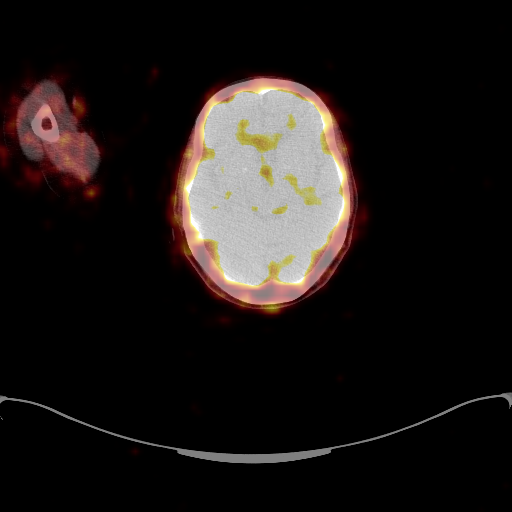
[im 47/187]
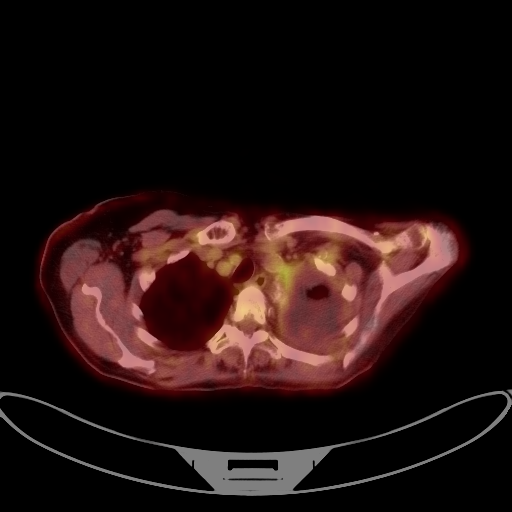
[im 94/187]
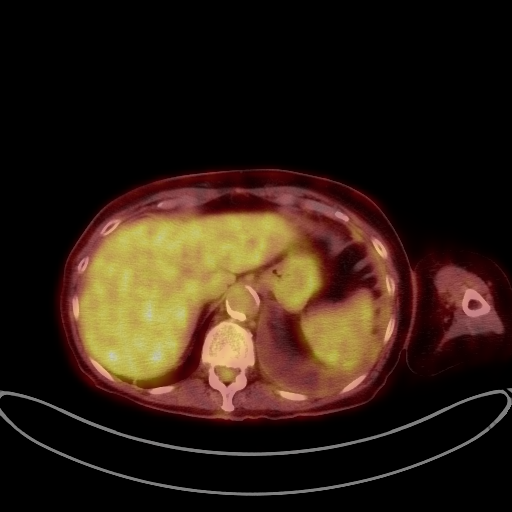
[im 140/187]
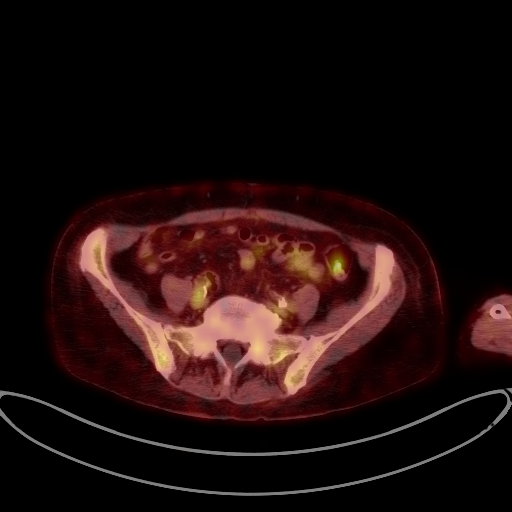
[im 187/187]
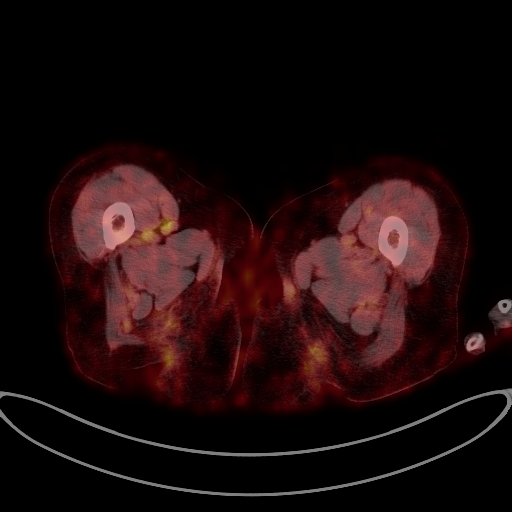

[Series 1034: results mm oncology reading · 0.76mm/px · 1 of 2 slices shown]
[im 1/2]
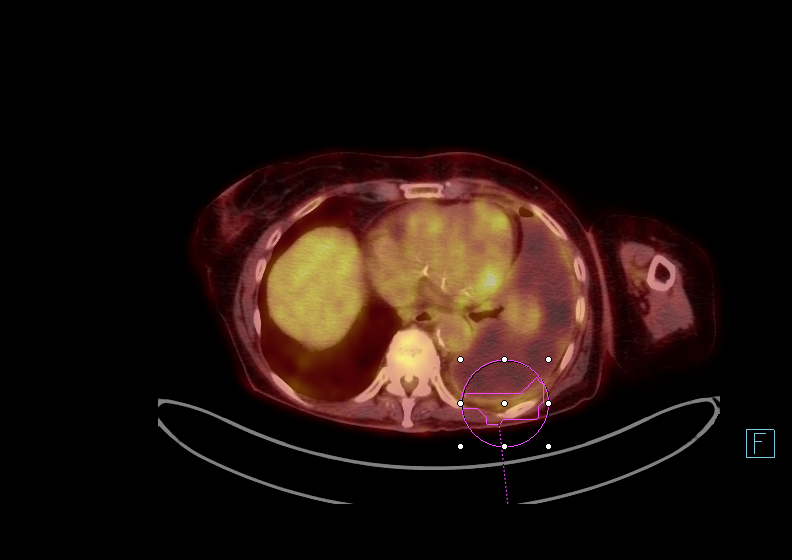

[25 of 25 positions shown; findings below may reference images not displayed]

FINDINGS: NECK

No hypermetabolic lymph nodes in the neck.

CHEST

Surgical changes from a left mastectomy. No findings for chest wall
tumor or axillary adenopathy.

Persistent large loculated left pleural fluid collection with
compressive atelectasis. There is a rim of hypermetabolism around
pleural fluid which could be inflammatory, infectious or neoplastic.
I do not see any obvious nodularity. The SUV max is 2.9.

The right middle lobe lesion identified on the prior chest CT is
weakly hypermetabolic with SUV max of 2.5. This could be an
inflammatory or infectious process. Recommend continued
surveillance.

Stable advanced atherosclerotic calcifications involving the
thoracic aorta and branch vessels including the coronary arteries.
The ascending aorta measures 4.4 cm.

ABDOMEN/PELVIS

No abnormal hypermetabolic activity within the liver, pancreas,
adrenal glands, or spleen. No hypermetabolic lymph nodes in the
abdomen or pelvis.

Advanced atherosclerotic calcifications involving the aorta and
branch vessels. Stable 3 cm infrarenal aortic aneurysm.

SKELETON

No findings to suggest osseous metastatic disease.

Severe degenerative changes involving the left shoulder.  The the
IMPRESSION: 1. Persistent large loculated left pleural fluid collection and
compressive lung atelectasis. Rim of hypermetabolism involving the
pleural could be inflammatory, infectious or possibly neoplastic. No
obvious nodularity and the SUV max is 2.9.
2. Stable right middle lobe lesion weakly hypermetabolic. This is
most likely inflammatory or infectious. Recommend continued
surveillance.
3. Severe atherosclerotic disease involving the thoracic and
abdominal aorta and branch vessels.

## 2016-11-29 IMAGING — CR DG CHEST 2V
2 series · 2 of 2 positions shown · non-contrast
Comparison: PA and lateral chest x-ray April 28, 2016

CLINICAL DATA: Recurrent left-sided pleural effusion

EXAM:
CHEST  2 VIEW

[w chest lat]
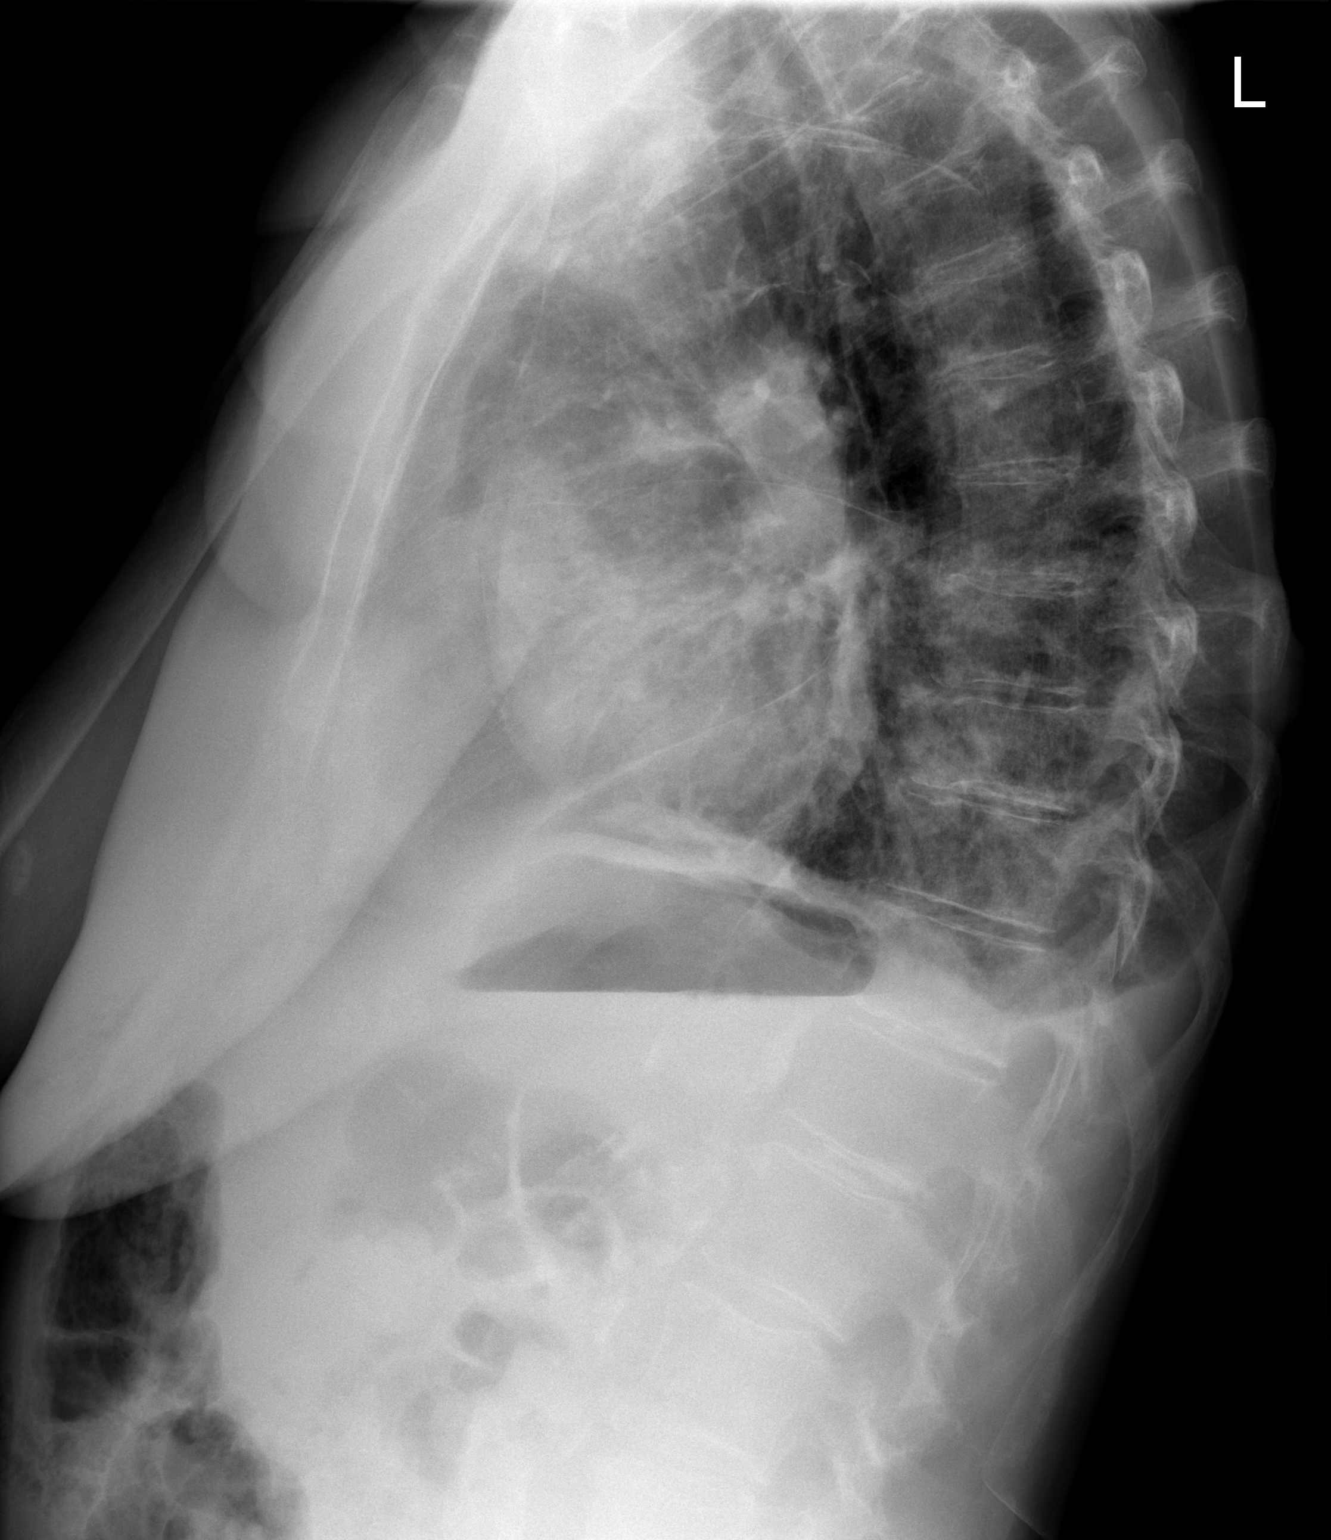

[w chest ap]
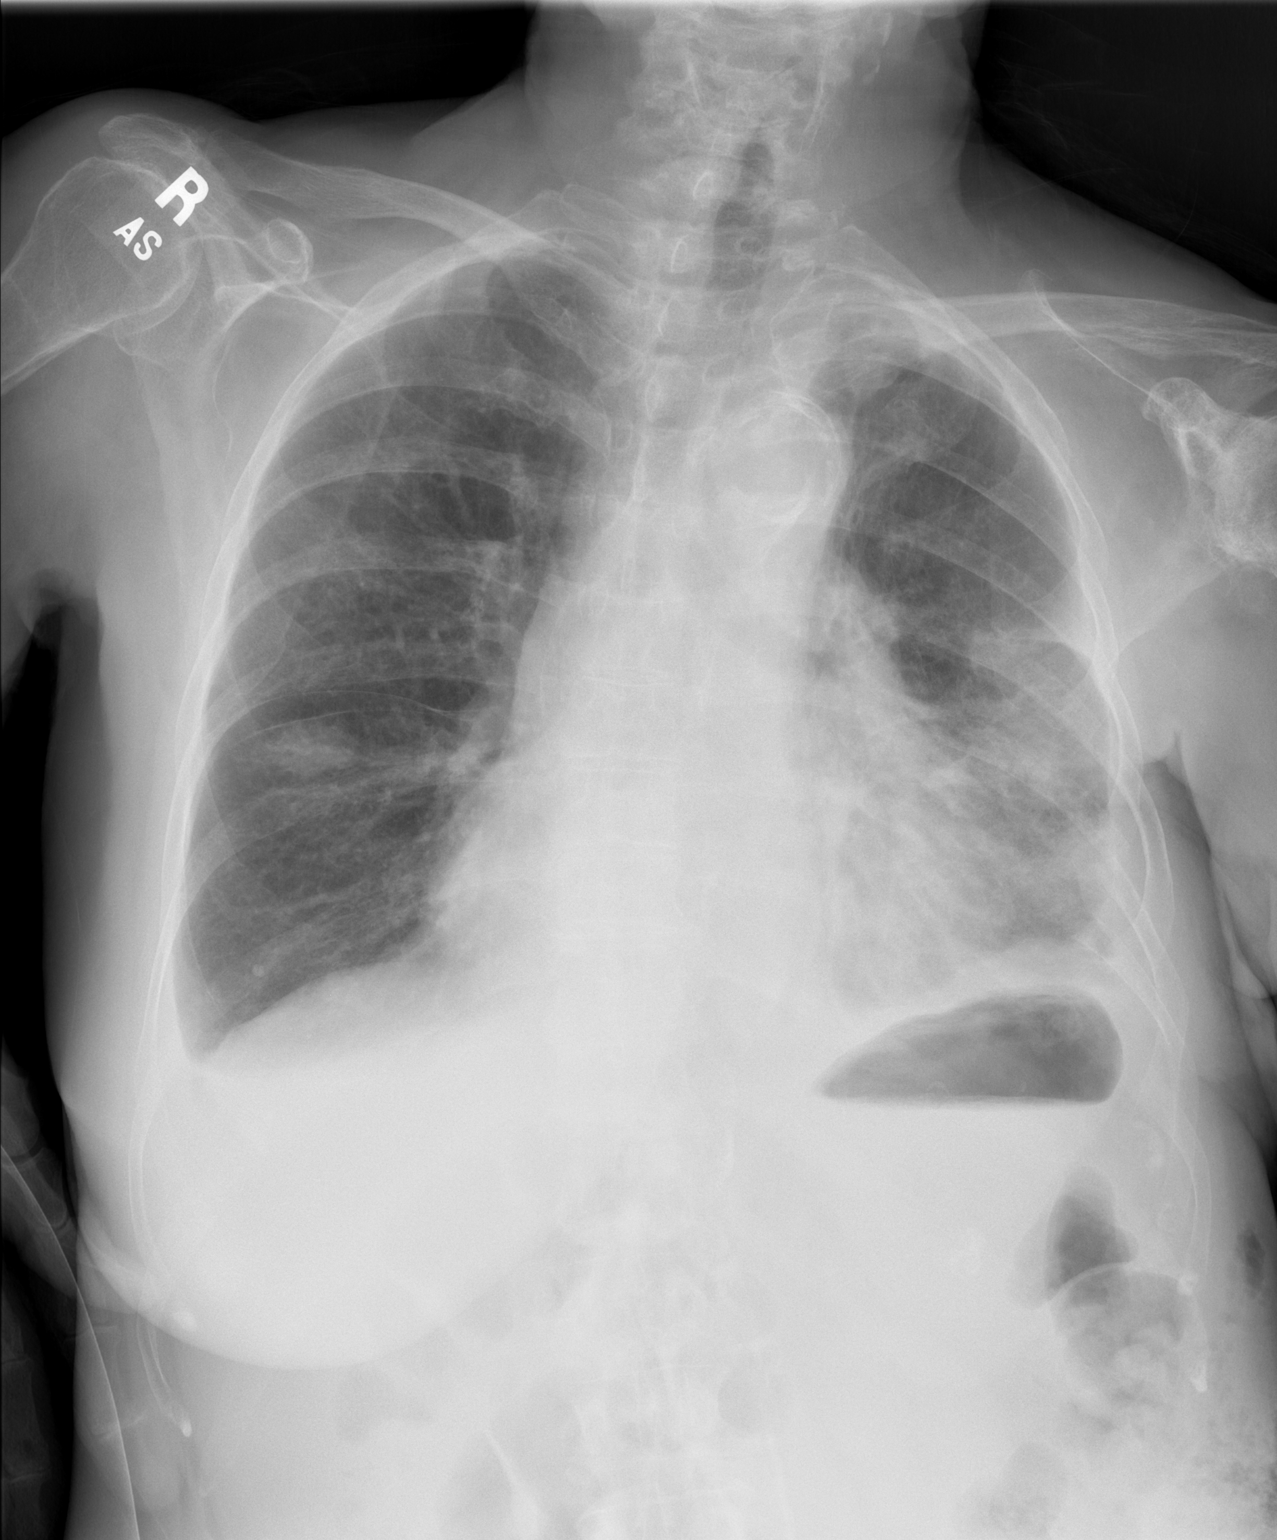

[2 of 2 positions shown; findings below may reference images not displayed]

FINDINGS: The right lung is adequately inflated. There is a small stable
pleural effusion. There is stable parenchymal density that projects
just inferior to the minor fissure on the frontal view. On the left
there is persistent increased interstitial density in the mid and
lower lung. There is a persistent small pleural effusion. The
cardiac silhouette remains enlarged. The pulmonary vascularity is
less prominent today. There is mild multilevel degenerative disc
disease of the thoracic spine.
IMPRESSION: Persistent small bilateral pleural effusions. Persistent focal
parenchymal opacity in the right mid to lower lung. Slightly
improved interstitial markings in the left mid and lower lung.

## 2016-12-03 DIAGNOSIS — I480 Paroxysmal atrial fibrillation: Secondary | ICD-10-CM | POA: Diagnosis not present

## 2016-12-03 DIAGNOSIS — E7800 Pure hypercholesterolemia, unspecified: Secondary | ICD-10-CM | POA: Diagnosis not present

## 2016-12-03 DIAGNOSIS — D62 Acute posthemorrhagic anemia: Secondary | ICD-10-CM | POA: Diagnosis not present

## 2016-12-03 DIAGNOSIS — R251 Tremor, unspecified: Secondary | ICD-10-CM | POA: Diagnosis not present

## 2017-02-26 ENCOUNTER — Other Ambulatory Visit: Payer: Self-pay | Admitting: *Deleted

## 2017-02-26 DIAGNOSIS — I712 Thoracic aortic aneurysm, without rupture, unspecified: Secondary | ICD-10-CM

## 2017-02-26 DIAGNOSIS — R911 Solitary pulmonary nodule: Secondary | ICD-10-CM

## 2017-04-07 ENCOUNTER — Encounter: Payer: Self-pay | Admitting: Thoracic Surgery (Cardiothoracic Vascular Surgery)

## 2017-04-07 ENCOUNTER — Ambulatory Visit (INDEPENDENT_AMBULATORY_CARE_PROVIDER_SITE_OTHER): Payer: Medicare Other | Admitting: Thoracic Surgery (Cardiothoracic Vascular Surgery)

## 2017-04-07 ENCOUNTER — Ambulatory Visit
Admission: RE | Admit: 2017-04-07 | Discharge: 2017-04-07 | Disposition: A | Discharge status: Home / Self Care | Payer: Medicare Other | Source: Ambulatory Visit | Attending: Thoracic Surgery (Cardiothoracic Vascular Surgery) | Admitting: Thoracic Surgery (Cardiothoracic Vascular Surgery)

## 2017-04-07 VITALS — BP 121/72 | HR 88 | Resp 16 | Ht 63.0 in | Wt 127.0 lb

## 2017-04-07 DIAGNOSIS — I712 Thoracic aortic aneurysm, without rupture, unspecified: Secondary | ICD-10-CM

## 2017-04-07 DIAGNOSIS — R911 Solitary pulmonary nodule: Secondary | ICD-10-CM | POA: Diagnosis not present

## 2017-04-07 NOTE — Progress Notes (Signed)
[address]       [place] [address]             [phone]     HPI: Leslie Carr returns for a scheduled follow up visit  She is an 83 yo woman who had a thoracotomy for decortication for chronic pleural effusion in April 2017. It started as a thoracoscopic procedure was converted to open due to bleeding. Pathology showed only inflammation.  Since then we have followed her for a right middle lobe nodule and a 4.5 cm ascending aneurysm.  I last saw in the office in October 2017. She was doing well at that time still having some pain related to her thoracotomy. In the interim since her last visit she's been doing well with no medical issues. She does still have some occasional pains from the thoracotomy incision. She is not having any other chest pain, pressure, or tightness. She has not had any shortness of breath. She is able to walk up and down stairs. She does have some issues with her balance. Her appetite is good. Her weight is stable.  Past Medical History:  Diagnosis Date  . Breast cancer ([place])   . CHF (congestive heart failure) ([place])   . Heart murmur   . History of bronchitis   . Hypercholesteremia   . Numbness and tingling in left hand   . Pleural effusion on left   . Shortness of breath dyspnea    "only going up a flight of stairs"  . Stroke [facility]) 2014   left sided weakness - mainly left hand    Current Outpatient Prescriptions  Medication Sig Dispense Refill  . docusate sodium (COLACE) 100 MG capsule Take 100 mg by mouth every evening.    . Multiple Vitamins-Minerals (MULTIVITAMIN WITH MINERALS) tablet Take 1 tablet by mouth daily.    . pravastatin (PRAVACHOL) 20 MG tablet Take 20 mg by mouth every evening.      No current facility-administered medications for this visit.     Physical Exam: BP 121/72 (BP Location: Right Arm, Patient Position: Sitting, Cuff Size: Large)   Pulse 88   Resp 16   Ht 5\' 3"  (1.6 m)   Wt 127 lb (57.6 kg)    SpO2 98% Comment: RA  BMI 22.50 kg/m  Elderly woman in no acute distress Well-developed well-nourished Alert and oriented 3 with mild left upper 70 weakness Transmitted murmurs first carotid bruits bilaterally Cardiac regular rate and rhythm with 2/6 systolic murmur Lungs diminished at left base, otherwise clear  Diagnostic Tests: CT CHEST WITHOUT CONTRAST  TECHNIQUE: Multidetector CT imaging of the chest was performed following the standard protocol without IV contrast.  COMPARISON:  10/07/16  FINDINGS: Cardiovascular: Somewhat limited due to the lack of IV contrast. Diffuse aortic calcifications are noted. Dilatation of the ascending aorta is again identified to 4.4 cm. This is stable in appearance from the prior exam. The pulmonary artery as visualized is stable as well. Coronary calcifications are seen. No significant cardiac enlargement is noted.  Mediastinum/Nodes: Thoracic inlet again demonstrates a left thyroid nodule with significant calcification. The overall appearance is stable from the prior exam. No hilar or mediastinal adenopathy is noted. The esophagus as visualize is within normal limits.  Lungs/Pleura: Stable collapse in the medial aspect of the left upper lobe is noted with changes of bronchiectasis. Pleural thickening is noted inferiorly on the left similar to that noted on the prior exam. No pneumothorax is noted. The  right lung is again well aerated. A small right upper lobe nodule is again seen on image number 17 of series 4 and stable from the prior exam. The irregular partially calcified lesion in the right middle lobe is again seen on image number 63 of series 4. It is stable in appearance measuring approximately 2.5 x 1.7 cm in greatest dimension. Calcified granuloma in the right lower lobe is again noted and stable. No new nodules are seen.  Upper Abdomen: Splenic granulomas are noted. No other upper abdominal abnormality is  seen.  Musculoskeletal: Degenerative changes of the thoracic spine are noted. Chronic degenerative changes of left shoulder joint are noted as well.  IMPRESSION: Stable dilatation of the ascending aorta.  Stable nodules within the right lung.  Chronic changes in the left lung stable from the prior exam.  No new focal abnormality is seen.   Electronically Signed   By: Inez Catalina M.D.   On: 04/07/2017 09:31 I personally reviewed the CT chest and concur with the findings noted above.  Impression: Leslie Carr is an 83 year old woman who had a left thoracotomy for decortication for chronic pleural effusion a year ago. The effusion was inflammatory in nature. No malignancy was seen on pathology. She has had no recurrence of the effusion. She does have some residual pain from her thoracotomy, but is not requiring medication for that.  Right middle lobe nodule- this is been stable on multiple scans. I previously had discussed biopsy with her. She was not interested. Given its stability of think we can continue to follow her radiographically this point.  Aortic atherosclerosis and ascending aneurysm- aneurysm is stable. She is on Pravachol.  Plan: Return in 6 months with CT chest   Melrose Nakayama, MD [facility] [phone]

## 2017-06-03 DIAGNOSIS — E7800 Pure hypercholesterolemia, unspecified: Secondary | ICD-10-CM | POA: Diagnosis not present

## 2017-06-03 DIAGNOSIS — I712 Thoracic aortic aneurysm, without rupture: Secondary | ICD-10-CM | POA: Diagnosis not present

## 2017-06-03 DIAGNOSIS — I480 Paroxysmal atrial fibrillation: Secondary | ICD-10-CM | POA: Diagnosis not present

## 2017-06-03 DIAGNOSIS — Z0000 Encounter for general adult medical examination without abnormal findings: Secondary | ICD-10-CM | POA: Diagnosis not present

## 2017-06-03 DIAGNOSIS — Z23 Encounter for immunization: Secondary | ICD-10-CM | POA: Diagnosis not present

## 2017-06-03 DIAGNOSIS — D62 Acute posthemorrhagic anemia: Secondary | ICD-10-CM | POA: Diagnosis not present

## 2017-06-03 DIAGNOSIS — M8588 Other specified disorders of bone density and structure, other site: Secondary | ICD-10-CM | POA: Diagnosis not present

## 2017-06-03 DIAGNOSIS — Z8673 Personal history of transient ischemic attack (TIA), and cerebral infarction without residual deficits: Secondary | ICD-10-CM | POA: Diagnosis not present

## 2017-09-14 ENCOUNTER — Other Ambulatory Visit: Payer: Self-pay | Admitting: *Deleted

## 2017-09-15 ENCOUNTER — Other Ambulatory Visit: Payer: Self-pay | Admitting: *Deleted

## 2017-09-15 DIAGNOSIS — I7121 Aneurysm of the ascending aorta, without rupture: Secondary | ICD-10-CM

## 2017-09-15 DIAGNOSIS — I712 Thoracic aortic aneurysm, without rupture: Secondary | ICD-10-CM

## 2017-09-15 DIAGNOSIS — R911 Solitary pulmonary nodule: Secondary | ICD-10-CM

## 2017-09-15 DIAGNOSIS — J90 Pleural effusion, not elsewhere classified: Secondary | ICD-10-CM

## 2017-10-13 ENCOUNTER — Ambulatory Visit (INDEPENDENT_AMBULATORY_CARE_PROVIDER_SITE_OTHER): Payer: Medicare Other | Admitting: Thoracic Surgery (Cardiothoracic Vascular Surgery)

## 2017-10-13 ENCOUNTER — Ambulatory Visit
Admission: RE | Admit: 2017-10-13 | Discharge: 2017-10-13 | Disposition: A | Discharge status: Home / Self Care | Payer: Medicare Other | Source: Ambulatory Visit | Attending: Thoracic Surgery (Cardiothoracic Vascular Surgery) | Admitting: Thoracic Surgery (Cardiothoracic Vascular Surgery)

## 2017-10-13 ENCOUNTER — Encounter: Payer: Self-pay | Admitting: Thoracic Surgery (Cardiothoracic Vascular Surgery)

## 2017-10-13 VITALS — BP 136/79 | HR 72 | Ht 63.0 in | Wt 135.0 lb

## 2017-10-13 DIAGNOSIS — I712 Thoracic aortic aneurysm, without rupture, unspecified: Secondary | ICD-10-CM

## 2017-10-13 DIAGNOSIS — I7121 Aneurysm of the ascending aorta, without rupture: Secondary | ICD-10-CM

## 2017-10-13 DIAGNOSIS — J90 Pleural effusion, not elsewhere classified: Secondary | ICD-10-CM

## 2017-10-13 DIAGNOSIS — R918 Other nonspecific abnormal finding of lung field: Secondary | ICD-10-CM | POA: Diagnosis not present

## 2017-10-13 DIAGNOSIS — R911 Solitary pulmonary nodule: Secondary | ICD-10-CM

## 2017-10-13 NOTE — Progress Notes (Signed)
[address]       [place] [address]             [phone]      HPI: Leslie Carr returns for a schedule 6 month follow up visit  He isn't a 83-year-old woman who had a thoracotomy for decortication for chronic pleural effusion in April 2017. It was an inflammatory effusion We have continued to follow her since then for a right middle lobe nodule as well as a 4.4 cm ascending aneurysm. I last saw her in April 2018. The nodule and aneurysm were both stable.  Her primary complaint today is that she feels tired. She says sometimes she has trouble sleeping.She still has some pain and itching around her chest tube sites from her prior surgery. She has not been having any chest pain, pressure, or tightness that would suggest angina.   Past Medical History:  Diagnosis Date  . Breast cancer ([place])   . CHF (congestive heart failure) ([place])   . Heart murmur   . History of bronchitis   . Hypercholesteremia   . Numbness and tingling in left hand   . Pleural effusion on left   . Shortness of breath dyspnea    "only going up a flight of stairs"  . Stroke [facility]) 2014   left sided weakness - mainly left hand    Current Outpatient Prescriptions  Medication Sig Dispense Refill  . docusate sodium (COLACE) 100 MG capsule Take 100 mg by mouth every evening.    . Multiple Vitamins-Minerals (MULTIVITAMIN WITH MINERALS) tablet Take 1 tablet by mouth daily.    . pravastatin (PRAVACHOL) 20 MG tablet Take 20 mg by mouth every evening.      No current facility-administered medications for this visit.     Physical Exam BP 136/79   Pulse 72   Ht 5\' 3"  (1.6 m)   Wt 135 lb (61.2 kg)   SpO2 95%   BMI 23.82 kg/m  83 year old woman in no acute distress Alert and oriented 3 with no focal deficits Lungs clear with equal breath sounds bilaterally Cardiac regular rate and rhythm with a 2/6 systolic murmur   Diagnostic Tests: CT CHEST WITHOUT CONTRAST  TECHNIQUE: Multidetector  CT imaging of the chest was performed following the standard protocol without IV contrast.  COMPARISON:  Chest CT examinations July 15, 2016, October 07, 2016, April 07, 2017  FINDINGS: Cardiovascular: Ascending thoracic aortic diameter measures 4.4 x 4.4 cm, stable. There is calcification in the proximal great vessels, most marked at the proximal left subclavian artery were there appears to be prudent 50% diameter obstruction in this area. There is extensive atherosclerotic calcification in the aorta. There are multiple foci of coronary artery calcification. Pericardium is not appreciably thickened.  Mediastinum/Nodes: Thyroid is unchanged in appearance. There is a focal area of calcification in the left lobe of the thyroid measuring 1.4 x 1.0 cm, stable. No new thyroid lesions are evident. There is no appreciable thoracic adenopathy. No esophageal lesions are appreciable.  Lungs/Pleura: There remains chronic volume loss in the medial aspect of the left upper lobe with bronchiectatic change in this area consistent with chronic scarring. There is again noted a somewhat irregular nodular opacity in the lateral segment right middle lobe abutting the right major fissure, partially calcified. This lesion measures 2.4 x 1.5 x 2.1 cm, not appreciably changed. There is a stable 2 mm nodular opacity in the apical segment right upper lobe seen on axial  slice 18 series 4. A small calcified granuloma in the superior segment right lower lobe is stable. There is scarring in the anterior left base, stable. Note that there is a degree of underlying centrilobular emphysematous change. No pleural effusion evident.  Upper Abdomen: In the visualized upper abdomen, there is upper abdominal atherosclerotic aortic calcification as well as extensive calcification in the visualized superior mesenteric artery. Visualized upper abdominal structures otherwise appear unremarkable.  Musculoskeletal:  There are no blastic or lytic bone lesions evident. There is degenerative change in thoracic spine.  IMPRESSION: 1. Underlying centrilobular emphysematous change. Scattered areas of scarring, stable. Stable somewhat irregular nodular opacity in the lateral segment right middle lobe measuring 2.4 x 1.5 x 2.1 cm. This lesion is partially calcified. It may represent a degree of chronic rounded atelectasis. A lesion of this nature should be followed for a minimum of 2 years without change to confirm benign etiology. A follow-up study in approximately 8 months would be advised to obtain 2 year surveillance. There is also a calcified granuloma in the right lower lobe and a 2 mm stable nodular lesion in the apical segment right upper lobe. No new parenchymal lung lesions are evident.  2. Aortic atherosclerosis. Ascending thoracic aorta measures 4.4 x 4.4 cm, stable. Recommend annual imaging followup by CTA or MRA. This recommendation follows 2010 ACCF/AHA/AATS/ACR/ASA/SCA/SCAI/SIR/STS/SVM Guidelines for the Diagnosis and Management of Patients with Thoracic Aortic Disease. Circulation. 2010; 121: D568-S168.  3. Atherosclerotic calcification in the visualized great vessels with hemodynamically significant obstruction noted in the left proximal subclavian artery as well as in the proximal superior mesenteric artery.  4.  No evident adenopathy.  5.  Stable calcified left lobe thyroid nodule.  Aortic Atherosclerosis (ICD10-I70.0) and Emphysema (ICD10-J43.9).   Electronically Signed   By: Lowella Grip III M.D.   On: 10/13/2017 10:25 I personally reviewed the CT images and concur with the findings noted above  Impression: Leslie Carr is an 83 year old woman who had a left thoracotomy for decortication for chronic inflammatory pleural effusion about 81 months ago. She has some residual itching and pain around the chest tube site incisions. We discussed the possibility of  trying a medication possible neuropathy. I would only wanted do that at the symptoms were severe. She does not feel that warrant medication.  Ascending aneurysm- heavily calcified. Stable at 4.4-4.5 cm. Needs continued semiannual follow-up.  Right middle lobe nodule- her main concern that this could potentially be a low-grade malignancy. It has remained relatively stable over time. It also could be infectious or inflammatory in nature. She does not want to have any intervention at this time.  Plan: Return in 6 months with CT chest  Melrose Nakayama, MD [facility] [phone]

## 2017-12-30 DIAGNOSIS — F321 Major depressive disorder, single episode, moderate: Secondary | ICD-10-CM | POA: Diagnosis not present

## 2017-12-30 DIAGNOSIS — R0789 Other chest pain: Secondary | ICD-10-CM | POA: Diagnosis not present

## 2018-03-10 ENCOUNTER — Other Ambulatory Visit: Payer: Self-pay | Admitting: *Deleted

## 2018-03-10 DIAGNOSIS — I712 Thoracic aortic aneurysm, without rupture, unspecified: Secondary | ICD-10-CM

## 2018-04-06 ENCOUNTER — Other Ambulatory Visit: Payer: Self-pay

## 2018-04-06 ENCOUNTER — Ambulatory Visit (INDEPENDENT_AMBULATORY_CARE_PROVIDER_SITE_OTHER): Payer: Medicare Other | Admitting: Thoracic Surgery (Cardiothoracic Vascular Surgery)

## 2018-04-06 ENCOUNTER — Ambulatory Visit
Admission: RE | Admit: 2018-04-06 | Discharge: 2018-04-06 | Disposition: A | Discharge status: Home / Self Care | Payer: Medicare Other | Source: Ambulatory Visit | Attending: Thoracic Surgery (Cardiothoracic Vascular Surgery) | Admitting: Thoracic Surgery (Cardiothoracic Vascular Surgery)

## 2018-04-06 ENCOUNTER — Encounter: Payer: Self-pay | Admitting: Thoracic Surgery (Cardiothoracic Vascular Surgery)

## 2018-04-06 VITALS — BP 118/66 | HR 88 | Resp 16 | Ht 63.0 in | Wt 136.0 lb

## 2018-04-06 DIAGNOSIS — I712 Thoracic aortic aneurysm, without rupture, unspecified: Secondary | ICD-10-CM

## 2018-04-06 DIAGNOSIS — R911 Solitary pulmonary nodule: Secondary | ICD-10-CM | POA: Diagnosis not present

## 2018-04-06 DIAGNOSIS — I7121 Aneurysm of the ascending aorta, without rupture: Secondary | ICD-10-CM

## 2018-04-06 NOTE — Progress Notes (Signed)
[address]       [place] [address]             [phone]      HPI: Leslie Carr returns for a scheduled follow-up visit  Leslie Carr is an 84 year old woman with a past medical history significant for breast cancer, left pleural effusion, hypercholesterolemia, prior stroke with some residual left-sided weakness, congestive heart failure, and heart murmur.  She had a thoracotomy for decortication in April 2017.  The effusion was inflammatory not malignant.  She has been followed since then for a right middle lobe nodule and a 4.4 cm ascending aneurysm.  We previously discussed attempting to biopsy the right middle lobe nodule, but she said that she would not have any treatment.  Therefore, we have been following it radiographically.  Her primary complaint today is that she hurts all over.  She has pain from "the tip of her toes to the top of her head."  She does have pain related to her previous chest tube sites.  She has not had any anginal type pain or shortness of breath.  Her appetite is good and her weight is stable.   Past Medical History:  Diagnosis Date  . Breast cancer ([place])   . CHF (congestive heart failure) ([place])   . Heart murmur   . History of bronchitis   . Hypercholesteremia   . Numbness and tingling in left hand   . Pleural effusion on left   . Shortness of breath dyspnea    "only going up a flight of stairs"  . Stroke [facility]) 2014   left sided weakness - mainly left hand    Current Outpatient Medications  Medication Sig Dispense Refill  . docusate sodium (COLACE) 100 MG capsule Take 100 mg by mouth every evening.    . Multiple Vitamins-Minerals (MULTIVITAMIN WITH MINERALS) tablet Take 1 tablet by mouth daily.    . pravastatin (PRAVACHOL) 20 MG tablet Take 20 mg by mouth every evening.      No current facility-administered medications for this visit.     Physical Exam BP 118/66 (BP Location: Right Arm, Patient Position: Sitting, Cuff  Size: Large)   Pulse 88   Resp 16   Ht 5\' 3"  (1.6 m)   Wt 136 lb (61.7 kg)   SpO2 95% Comment: RA  BMI 24.91 kg/m  84 year old woman in no acute distress Alert and oriented x3.  Cranial nerves intact. Lungs clear with equal breath sounds bilaterally Cardiac regular rate and rhythm with a 2/6 high-pitched systolic murmur Trace edema left ankle  Diagnostic Tests: CT CHEST WITHOUT CONTRAST  TECHNIQUE: Multidetector CT imaging of the chest was performed following the standard protocol without IV contrast.  COMPARISON:  CT scan of October 13, 2017. PET scan of March 26, 2016. CT scan of March 04, 2016.  FINDINGS: Cardiovascular: 4.5 cm ascending thoracic aortic aneurysm is not significantly changed compared to prior exam. Due to lack of intravenous contrast, dissection cannot be excluded on the basis of this exam. Atherosclerosis of thoracic aorta is noted. Coronary artery calcifications are noted. No pericardial effusion is noted.  Mediastinum/Nodes: Stable calcified left thyroid lesion is noted. Esophagus is unremarkable. No mediastinal adenopathy is noted.  Lungs/Pleura: No pneumothorax or pleural effusion is noted. Stable left basilar scarring is noted. Stable scarring is noted in lingular segment of left upper lobe. Stable left apical scarring is noted. Stable calcified granuloma seen in right lower lobe. Abnormality seen in  right middle lobe along superior aspect of right major fissure now measures 2.9 x 2.1 x 1.8 cm which is slightly enlarged compared to prior exams.  Upper Abdomen: No acute abnormality.  Musculoskeletal: Degenerative changes seen involving the left glenohumeral joint.  IMPRESSION: 4.5 cm ascending thoracic aorta aneurysm which is not significantly changed compared to prior exam. Ascending thoracic aortic aneurysm. Recommend semi-annual imaging followup by CTA or MRA and referral to cardiothoracic surgery if not already obtained. This  recommendation follows 2010 ACCF/AHA/AATS/ACR/ASA/SCA/SCAI/SIR/STS/SVM Guidelines for the Diagnosis and Management of Patients With Thoracic Aortic Disease. Circulation. 2010; 121: F026-V785.  Coronary artery calcifications are noted suggesting coronary artery disease.  2.9 x 2.1 x 1.8 cm irregular density is seen in right middle lobe along superior aspect of right major fissure which is slightly enlarged compared to prior exams, particularly when compared to study of March 2017. Repeat PET scan is recommended to evaluate for possible malignancy.  Aortic Atherosclerosis (ICD10-I70.0).   Electronically Signed   By: Marijo Conception, M.D.   On: 04/06/2018 12:14 I personally reviewed the CT chest and concur with the findings noted above.  Impression: Leslie Carr is an 84 year old woman with a past history of breast cancer, congestive heart failure, heart murmur, hypercholesterolemia, and a prior stroke.  She had a left thoracotomy for decortication for chronic pleural effusion back in 2017.  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  Also showed a right middle lobe lung nodule.  Ascending aneurysm-stable at 4.4 cm.  Thoracic aortic atherosclerosis-porcelain aorta.  Right middle lobe lung nodule-nodule may have grown slightly over 2 years.  This is extremely slow growth.  She had a PET back in 2017 which showed some low-grade metabolic activity associated with the lesion.  I do not think another PET is going to change management at this point.  I discussed with Leslie Carr and her daughter that since this had increased in size slightly it was an indication for a biopsy.  I would only want to do the biopsy if she was going to consider treatment.  She says she will not consider surgery.  She might be willing to consider radiation but needs to think about it before she makes a decision.  This could be biopsied either bronchoscopically  or with a CT-guided biopsy.  I think if IR was willing to do CT-guided biopsy I would prefer that to avoid the need for general anesthesia for navigational bronchoscopy.  She is not sure whether she wants to have a biopsy done or not.  She wants to think it over and discuss with her daughter.  Plan: I will plan to repeat her CT scan in 6 months.  If she wishes to have a biopsy performed after discussion with the family, we will arrange that.  Melrose Nakayama, MD [facility] [phone]

## 2018-04-07 ENCOUNTER — Other Ambulatory Visit: Payer: Self-pay | Admitting: *Deleted

## 2018-04-07 DIAGNOSIS — R911 Solitary pulmonary nodule: Secondary | ICD-10-CM

## 2018-04-26 ENCOUNTER — Other Ambulatory Visit: Payer: Self-pay | Admitting: Radiology

## 2018-04-27 ENCOUNTER — Ambulatory Visit (HOSPITAL_COMMUNITY)
Admission: RE | Admit: 2018-04-27 | Discharge: 2018-04-27 | Disposition: A | Discharge status: Home / Self Care | Payer: Medicare Other | Source: Ambulatory Visit | Attending: Interventional Radiology | Admitting: Interventional Radiology

## 2018-04-27 ENCOUNTER — Ambulatory Visit (HOSPITAL_COMMUNITY)
Admission: RE | Admit: 2018-04-27 | Discharge: 2018-04-27 | Disposition: A | Discharge status: Home / Self Care | Payer: Medicare Other | Source: Ambulatory Visit | Attending: Thoracic Surgery (Cardiothoracic Vascular Surgery) | Admitting: Thoracic Surgery (Cardiothoracic Vascular Surgery)

## 2018-04-27 ENCOUNTER — Encounter (HOSPITAL_COMMUNITY): Payer: Self-pay

## 2018-04-27 DIAGNOSIS — R918 Other nonspecific abnormal finding of lung field: Secondary | ICD-10-CM | POA: Diagnosis not present

## 2018-04-27 DIAGNOSIS — Z853 Personal history of malignant neoplasm of breast: Secondary | ICD-10-CM | POA: Insufficient documentation

## 2018-04-27 DIAGNOSIS — Z87891 Personal history of nicotine dependence: Secondary | ICD-10-CM | POA: Insufficient documentation

## 2018-04-27 DIAGNOSIS — Z7982 Long term (current) use of aspirin: Secondary | ICD-10-CM | POA: Insufficient documentation

## 2018-04-27 DIAGNOSIS — E7800 Pure hypercholesterolemia, unspecified: Secondary | ICD-10-CM | POA: Diagnosis not present

## 2018-04-27 DIAGNOSIS — Z888 Allergy status to other drugs, medicaments and biological substances status: Secondary | ICD-10-CM | POA: Insufficient documentation

## 2018-04-27 DIAGNOSIS — Z8673 Personal history of transient ischemic attack (TIA), and cerebral infarction without residual deficits: Secondary | ICD-10-CM | POA: Diagnosis not present

## 2018-04-27 DIAGNOSIS — I509 Heart failure, unspecified: Secondary | ICD-10-CM | POA: Insufficient documentation

## 2018-04-27 DIAGNOSIS — Z79899 Other long term (current) drug therapy: Secondary | ICD-10-CM | POA: Diagnosis not present

## 2018-04-27 DIAGNOSIS — Z90710 Acquired absence of both cervix and uterus: Secondary | ICD-10-CM | POA: Diagnosis not present

## 2018-04-27 DIAGNOSIS — Z9012 Acquired absence of left breast and nipple: Secondary | ICD-10-CM | POA: Diagnosis not present

## 2018-04-27 DIAGNOSIS — J984 Other disorders of lung: Secondary | ICD-10-CM | POA: Diagnosis not present

## 2018-04-27 DIAGNOSIS — R911 Solitary pulmonary nodule: Secondary | ICD-10-CM | POA: Insufficient documentation

## 2018-04-27 LAB — CBC
HCT: 40 % (ref 36.0–46.0)
HEMOGLOBIN: 12.7 g/dL (ref 12.0–15.0)
MCH: 29.5 pg (ref 26.0–34.0)
MCHC: 31.8 g/dL (ref 30.0–36.0)
MCV: 92.8 fL (ref 78.0–100.0)
Platelets: 240 10*3/uL (ref 150–400)
RBC: 4.31 MIL/uL (ref 3.87–5.11)
RDW: 13.4 % (ref 11.5–15.5)
WBC: 5 10*3/uL (ref 4.0–10.5)

## 2018-04-27 LAB — PROTIME-INR
INR: 0.99
Prothrombin Time: 13 seconds (ref 11.4–15.2)

## 2018-04-27 MED ORDER — SODIUM CHLORIDE 0.9 % IV SOLN: INTRAVENOUS | Status: DC

## 2018-04-27 MED ORDER — SODIUM CHLORIDE 0.9 % IV SOLN
INTRAVENOUS | Status: AC | PRN
  Administered 2018-04-27: 10 mL/h via INTRAVENOUS

## 2018-04-27 MED ORDER — MIDAZOLAM HCL 2 MG/2ML IJ SOLN
INTRAMUSCULAR | Status: AC | PRN
  Administered 2018-04-27: 1 mg via INTRAVENOUS

## 2018-04-27 MED ORDER — LIDOCAINE HCL 1 % IJ SOLN
INTRAMUSCULAR | Status: AC
  Filled 2018-04-27: qty 20

## 2018-04-27 MED ORDER — FENTANYL CITRATE (PF) 100 MCG/2ML IJ SOLN
INTRAMUSCULAR | Status: AC | PRN
  Administered 2018-04-27: 50 ug via INTRAVENOUS

## 2018-04-27 MED ORDER — MIDAZOLAM HCL 2 MG/2ML IJ SOLN
INTRAMUSCULAR | Status: AC
  Filled 2018-04-27: qty 2

## 2018-04-27 MED ORDER — FENTANYL CITRATE (PF) 100 MCG/2ML IJ SOLN
INTRAMUSCULAR | Status: AC
  Filled 2018-04-27: qty 2

## 2018-04-27 NOTE — Procedures (Signed)
RML mass  S/p CT biopsy  No comp Stable ebl min Path pending Full report in pacs

## 2018-04-27 NOTE — H&P (Signed)
Chief Complaint: Patient was seen in consultation today for right lung lesion biopsy at the request of Hendrickson,Steven C  Referring Physician(s): Maren[place] C  Supervising Physician: Daryll Brod  Patient Status: [facility] - Out-pt  History of Present Illness: Leslie Carr is a 84 y.o. female   Dr Roxan Hockey note 04/06/18: History of breast cancer, congestive heart failure, heart murmur, hypercholesterolemia, and a prior stroke.  She had a left thoracotomy for decortication for chronic pleural effusion back in 2017.  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  Also showed a right middle lobe lung nodule Right middle lobe lung nodule-nodule may have grown slightly over 2 years.  This is extremely slow growth  CT 4/19:  2.9 x 2.1 x 1.8 cm irregular density is seen in right middle lobe along superior aspect of right major fissure which is slightly enlarged compared to prior exams, particularly when compared to study of March 2017. Repeat PET scan is recommended to evaluate for possible malignancy.  Scheduled now for biopsy per Dr Roxan Hockey   Past Medical History:  Diagnosis Date  . Breast cancer ([place])   . CHF (congestive heart failure) ([place])   . Heart murmur   . History of bronchitis   . Hypercholesteremia   . Numbness and tingling in left hand   . Pleural effusion on left   . Shortness of breath dyspnea    "only going up a flight of stairs"  . Stroke [facility]) 2014   left sided weakness - mainly left hand    Past Surgical History:  Procedure Laterality Date  . ABDOMINAL HYSTERECTOMY  1976  . CATARACT EXTRACTION Right   . COLONOSCOPY    . DECORTICATION Left 04/17/2016   Procedure: Visceral Pleural DECORTICATION;  Surgeon: Melrose Nakayama, MD;  Location: [place];  Service: Thoracic;  Laterality: Left;  . FOOT SURGERY Right    "bone spur removal"  . MASTECTOMY Left 1985  . PLEURAL BIOPSY Left  04/17/2016   Procedure:  Left PLEURAL BIOPSY;  Surgeon: Melrose Nakayama, MD;  Location: [place];  Service: Thoracic;  Laterality: Left;  . PLEURAL EFFUSION DRAINAGE Left 04/17/2016   Procedure: DRAINAGE OF Left PLEURAL EFFUSION;  Surgeon: Melrose Nakayama, MD;  Location: [place];  Service: Thoracic;  Laterality: Left;  . TONSILLECTOMY    . VIDEO ASSISTED THORACOSCOPY Left 04/17/2016   Procedure:  Left VIDEO ASSISTED THORACOSCOPY with Drainage of Pleural Effusion, Pleural Biopsy, Visceral Pleural Decortication and Placement of On-Q pain pump;  Surgeon: Melrose Nakayama, MD;  Location: [place];  Service: Thoracic;  Laterality: Left;    Allergies: Lipitor [atorvastatin]  Medications: Prior to Admission medications   Medication Sig Start Date End Date Taking? Authorizing Provider  aspirin EC 81 MG tablet Take 81 mg by mouth daily.   Yes [provider]  docusate sodium (COLACE) 100 MG capsule Take 100 mg by mouth every evening.   Yes [provider]  Multiple Vitamins-Minerals (EMERGEN-C IMMUNE PLUS PO) Take 1 packet by mouth daily.   Yes [provider]  pravastatin (PRAVACHOL) 20 MG tablet Take 20 mg by mouth every evening.    Yes [provider]  vitamin B-12 (CYANOCOBALAMIN) 1000 MCG tablet Take 1,000 mcg by mouth daily.   Yes [provider]     Family History  Problem Relation Age of Onset  . Heart disease Neg Hx   . Heart attack Neg Hx   .  Hypertension Neg Hx   . Hyperlipidemia Neg Hx   . Heart failure Neg Hx     Social History   Socioeconomic History  . Marital status: Divorced    Spouse name: Not on file  . Number of children: Not on file  . Years of education: Not on file  . Highest education level: Not on file  Occupational History  . Not on file  Social Needs  . Financial resource strain: Not on file  . Food insecurity:    Worry: Not on file    Inability: Not on file  . Transportation needs:    Medical: Not on  file    Non-medical: Not on file  Tobacco Use  . Smoking status: Former Research scientist (life sciences)  . Smokeless tobacco: Never Used  . Tobacco comment: Quit over 40 years ago  Substance and Sexual Activity  . Alcohol use: No    Alcohol/week: 0.0 oz  . Drug use: No  . Sexual activity: Not on file  Lifestyle  . Physical activity:    Days per week: Not on file    Minutes per session: Not on file  . Stress: Not on file  Relationships  . Social connections:    Talks on phone: Not on file    Gets together: Not on file    Attends religious service: Not on file    Active member of club or organization: Not on file    Attends meetings of clubs or organizations: Not on file    Relationship status: Not on file  Other Topics Concern  . Not on file  Social History Narrative  . Not on file    Review of Systems: A 12 point ROS discussed and pertinent positives are indicated in the HPI above.  All other systems are negative.  Review of Systems  Constitutional: Negative for activity change, fatigue, fever and unexpected weight change.  Respiratory: Negative for cough and choking.   Cardiovascular: Negative for chest pain.  Gastrointestinal: Negative for abdominal pain.  Psychiatric/Behavioral: Negative for behavioral problems and confusion.    Vital Signs: BP (!) 168/83 (BP Location: Right Arm)   Pulse 81   Temp 97.7 F (36.5 C) (Oral)   Ht 5\' 2"  (1.575 m)   Wt 136 lb (61.7 kg)   SpO2 100%   BMI 24.87 kg/m   Physical Exam  Constitutional: She is oriented to person, place, and time.  Cardiovascular: Normal rate and regular rhythm.  Murmur heard. Pulmonary/Chest: Effort normal and breath sounds normal.  Abdominal: Soft. Bowel sounds are normal.  Musculoskeletal: Normal range of motion.  Neurological: She is alert and oriented to person, place, and time.  Skin: Skin is warm and dry.  Psychiatric: She has a normal mood and affect. Her behavior is normal. Judgment and thought content normal.    Nursing note and vitals reviewed.   Imaging: Ct Chest Wo Contrast  Result Date: 04/06/2018 CLINICAL DATA:  Thoracic aortic aneurysm without rupture. EXAM: CT CHEST WITHOUT CONTRAST TECHNIQUE: Multidetector CT imaging of the chest was performed following the standard protocol without IV contrast. COMPARISON:  CT scan of October 13, 2017. PET scan of March 26, 2016. CT scan of March 04, 2016. FINDINGS: Cardiovascular: 4.5 cm ascending thoracic aortic aneurysm is not significantly changed compared to prior exam. Due to lack of intravenous contrast, dissection cannot be excluded on the basis of this exam. Atherosclerosis of thoracic aorta is noted. Coronary artery calcifications are noted. No pericardial effusion is noted. Mediastinum/Nodes: Stable calcified  left thyroid lesion is noted. Esophagus is unremarkable. No mediastinal adenopathy is noted. Lungs/Pleura: No pneumothorax or pleural effusion is noted. Stable left basilar scarring is noted. Stable scarring is noted in lingular segment of left upper lobe. Stable left apical scarring is noted. Stable calcified granuloma seen in right lower lobe. Abnormality seen in right middle lobe along superior aspect of right major fissure now measures 2.9 x 2.1 x 1.8 cm which is slightly enlarged compared to prior exams. Upper Abdomen: No acute abnormality. Musculoskeletal: Degenerative changes seen involving the left glenohumeral joint. IMPRESSION: 4.5 cm ascending thoracic aorta aneurysm which is not significantly changed compared to prior exam. Ascending thoracic aortic aneurysm. Recommend semi-annual imaging followup by CTA or MRA and referral to cardiothoracic surgery if not already obtained. This recommendation follows 2010 ACCF/AHA/AATS/ACR/ASA/SCA/SCAI/SIR/STS/SVM Guidelines for the Diagnosis and Management of Patients With Thoracic Aortic Disease. Circulation. 2010; 121: R443-X540. Coronary artery calcifications are noted suggesting coronary artery disease. 2.9  x 2.1 x 1.8 cm irregular density is seen in right middle lobe along superior aspect of right major fissure which is slightly enlarged compared to prior exams, particularly when compared to study of March 2017. Repeat PET scan is recommended to evaluate for possible malignancy. Aortic Atherosclerosis (ICD10-I70.0). Electronically Signed   By: Marijo Conception, M.D.   On: 04/06/2018 12:14    Labs:  CBC: No results for input(s): WBC, HGB, HCT, PLT in the last 8760 hours.  COAGS: No results for input(s): INR, APTT in the last 8760 hours.  BMP: No results for input(s): NA, K, CL, CO2, GLUCOSE, BUN, CALCIUM, CREATININE, GFRNONAA, GFRAA in the last 8760 hours.  Invalid input(s): CMP  LIVER FUNCTION TESTS: No results for input(s): BILITOT, AST, ALT, ALKPHOS, PROT, ALBUMIN in the last 8760 hours.  TUMOR MARKERS: No results for input(s): AFPTM, CEA, CA199, CHROMGRNA in the last 8760 hours.  Assessment and Plan:  Right lung lesion - enlarging Consideration of treatment  Biopsy needed for diagnosis Risks and benefits discussed with the patient including, but not limited to bleeding, hemoptysis, respiratory failure requiring intubation, infection, pneumothorax requiring chest tube placement, stroke from air embolism or even death.  All of the patient's questions were answered, patient is agreeable to proceed. Consent signed and in chart.   Thank you for this interesting consult.  I greatly enjoyed meeting Leslie Carr and look forward to participating in their care.  A copy of this report was sent to the requesting provider on this date.  Electronically Signed: Lavonia Drafts, PA-C 04/27/2018, 10:20 AM   I spent a total of  30 Minutes   in face to face in clinical consultation, greater than 50% of which was counseling/coordinating care for right lung lesion biopsy

## 2018-04-27 NOTE — Discharge Instructions (Signed)
Lung Biopsy, Care After °This sheet gives you information about how to care for yourself after your procedure. Your health care provider may also give you more specific instructions depending on the type of biopsy you had. If you have problems or questions, contact your health care provider. °What can I expect after the procedure? °After the procedure, it is common to have: °· A cough. °· A sore throat. °· Pain where a needle, bronchoscope, or incision was used to collect a biopsy sample (biopsy site). ° °Follow these instructions at home: °Medicines °· Take over-the-counter and prescription medicines only as told by your health care provider. °· Do not drive for 24 hours if you were given a sedative. °· Do not drink alcohol while taking pain medicine. °· Do not drive or use heavy machinery while taking prescription pain medicine. °· To prevent or treat constipation while you are taking prescription pain medicine, your health care provider may recommend that you: °? Drink enough fluid to keep your urine clear or pale yellow. °? Take over-the-counter or prescription medicines. °? Eat foods that are high in fiber, such as fresh fruits and vegetables, whole grains, and beans. °? Limit foods that are high in fat and processed sugars, such as fried and sweet foods. °Activity °· If you had an incision during your procedure, avoid activities that may pull the incision site open. °· Return to your normal activities as told by your health care provider. Ask your health care provider what activities are safe for you. °If you had an open biopsy:  °· Follow instructions from your health care provider about how to take care of your incision. Make sure you: °? Wash your hands with soap and water before you change your bandage (dressing). If soap and water are not available, use hand sanitizer. °? Change your dressing as told by your health care provider. °? Leave stitches (sutures), skin glue, or adhesive strips in place. These  skin closures may need to stay in place for 2 weeks or longer. If adhesive strip edges start to loosen and curl up, you may trim the loose edges. Do not remove adhesive strips completely unless your health care provider tells you to do that. °· Check your incision area every day for signs of infection. Check for: °? Redness, swelling, or pain. °? Fluid or blood. °? Warmth. °? Pus or a bad smell. °General instructions °· It is up to you to get the results of your procedure. Ask your health care provider, or the department that is doing the procedure, when your results will be ready. °Contact a health care provider if: °· You have a fever. °· You have redness, swelling, or pain around your biopsy site. °· You have fluid or blood coming from your biopsy site. °· Your biopsy site feels warm to the touch. °· You have pus or a bad smell coming from your biopsy site. °Get help right away if: °· You cough up blood. °· You have trouble breathing. °· You have chest pain. °Summary °· After the procedure, it is common to have a sore throat and a cough. °· Return to your normal activities as told by your health care provider. Ask your health care provider what activities are safe for you. °· Take over-the-counter and prescription medicines only as told by your health care provider. °· Report any unusual symptoms to your health care provider. °This information is not intended to replace advice given to you by your health care provider. Make sure   you discuss any questions you have with your health care provider. °Document Released: 01/06/2017 Document Revised: 01/06/2017 Document Reviewed: 01/06/2017 °Elsevier Interactive Patient Education © 2018 [employer] ° °

## 2018-05-04 ENCOUNTER — Telehealth: Payer: Self-pay

## 2018-05-04 NOTE — Telephone Encounter (Signed)
Patient notified of biopsy results per Dr. Roxan Hockey.  Patient is schedule to come in to discuss results.  Patient aware of appointment date and time.

## 2018-05-04 NOTE — Telephone Encounter (Signed)
-----   Message from Melrose Nakayama, MD sent at 05/04/2018  9:58 AM EDT ----- Let her know it looks benign, but they are still doing some more tests to try to determine exactly what it is  She needs an appointment next week to discuss final results  [facility] ----- Message ----- From: Donnella Sham, RN Sent: 05/03/2018  10:09 AM To: Melrose Nakayama, MD  Ms. Gall called requesting results from biopsy.  Also, she does not currently have a follow-up appointment either.  Please advise.  Caryl Pina

## 2018-05-11 ENCOUNTER — Encounter: Payer: Self-pay | Admitting: Thoracic Surgery (Cardiothoracic Vascular Surgery)

## 2018-05-11 ENCOUNTER — Other Ambulatory Visit: Payer: Self-pay

## 2018-05-11 ENCOUNTER — Ambulatory Visit (INDEPENDENT_AMBULATORY_CARE_PROVIDER_SITE_OTHER): Payer: Medicare Other | Admitting: Thoracic Surgery (Cardiothoracic Vascular Surgery)

## 2018-05-11 VITALS — BP 145/81 | HR 78 | Resp 16 | Ht 63.0 in | Wt 139.2 lb

## 2018-05-11 DIAGNOSIS — R911 Solitary pulmonary nodule: Secondary | ICD-10-CM

## 2018-05-11 NOTE — Progress Notes (Signed)
[address]       [place] [address]             [phone]    HPI: Leslie Carr returns to discuss the results of her biopsies  Leslie Carr is an 84 year old woman with a past history of breast cancer, left pleural effusion, hypercholesterolemia, mild left hemiparesis from prior stroke, heart failure, and congestive heart failure, and an ascending aneurysm.  She had a decortication April 2017.  The effusion was inflammatory.  We have been following her for a right middle lobe nodule and a 4.4 cm ascending aneurysm since then.  On her most recent visit her aneurysm was 4.5 cm and her right middle lobe nodule is slightly enlarged.  We had a CT-guided needle biopsy done.  She tolerated the procedure well without any issues.  Past Medical History:  Diagnosis Date  . Breast cancer ([place])   . CHF (congestive heart failure) ([place])   . Heart murmur   . History of bronchitis   . Hypercholesteremia   . Numbness and tingling in left hand   . Pleural effusion on left   . Shortness of breath dyspnea    "only going up a flight of stairs"  . Stroke [facility]) 2014   left sided weakness - mainly left hand      Current Outpatient Medications  Medication Sig Dispense Refill  . aspirin EC 81 MG tablet Take 81 mg by mouth daily.    Marland Kitchen docusate sodium (COLACE) 100 MG capsule Take 100 mg by mouth every evening.    . Multiple Vitamins-Minerals (EMERGEN-C IMMUNE PLUS PO) Take 1 packet by mouth daily.    . pravastatin (PRAVACHOL) 20 MG tablet Take 20 mg by mouth every evening.     . vitamin B-12 (CYANOCOBALAMIN) 1000 MCG tablet Take 1,000 mcg by mouth daily.     No current facility-administered medications for this visit.     Physical Exam BP (!) 145/81 (BP Location: Right Arm, Patient Position: Sitting, Cuff Size: Normal)   Pulse 78   Resp 16   Ht 5\' 3"  (1.6 m)   Wt 139 lb 3.2 oz (63.1 kg)   SpO2 94% Comment: ON RA  BMI 24.105 kg/m  84 year old woman in no acute  distress  Diagnostic Tests: Diagnosis Lung, needle/core biopsy(ies), Right Middle Lobe - BENIGN FIBROELASTOTIC LESION WITH CALCIFICATION. SEE NOTE. Diagnosis Note Dr. Saralyn Pilar has reviewed this case and concurs with the above diagnosis. Congo red stain to rule out amyloidosis is pending and will be reported in an addendum. (NK:kh 04-28-18) Jaquita Folds MD Pathologist, Electronic Signature (Case signed 04/28/2018)  Impression: Leslie Carr is an 84 year old woman with a right middle lobe nodule and an ascending aneurysm.  Her ascending aneurysm has been relatively stable.  She needs continued follow-up every 6 months.  Right middle lobe nodule was biopsied.  This was a very slow-growing lesion that had mild metabolic activity on a PET CT a couple of years ago.  Biopsy showed only benign fibroelastic tissue.  There was some calcification.  There is no evidence of malignancy.  I do not think any further work-up is necessary for that lesion at the present time, but she does need continued follow-up.  Plan: We will talk with pathology regarding results of Congo red stain for amyloidosis.  Return in 6 months with CT angios chest to follow-up ascending aneurysm and right middle lobe nodule  Melrose Nakayama, MD [facility] (  [phone]    

## 2018-05-12 ENCOUNTER — Telehealth: Payer: Self-pay | Admitting: Thoracic Surgery (Cardiothoracic Vascular Surgery)

## 2018-05-12 NOTE — Telephone Encounter (Signed)
Called with results of congo red stain on lung nodule.  + for amyloid.  Consistent with a benign amyloid lung nodule.  Revonda Standard Roxan Hockey, MD [facility] [phone]

## 2018-05-31 DIAGNOSIS — H52202 Unspecified astigmatism, left eye: Secondary | ICD-10-CM | POA: Diagnosis not present

## 2018-05-31 DIAGNOSIS — H524 Presbyopia: Secondary | ICD-10-CM | POA: Diagnosis not present

## 2018-05-31 DIAGNOSIS — Z961 Presence of intraocular lens: Secondary | ICD-10-CM | POA: Diagnosis not present

## 2018-05-31 DIAGNOSIS — H5212 Myopia, left eye: Secondary | ICD-10-CM | POA: Diagnosis not present

## 2018-05-31 DIAGNOSIS — H2512 Age-related nuclear cataract, left eye: Secondary | ICD-10-CM | POA: Diagnosis not present

## 2018-07-23 DIAGNOSIS — Z8673 Personal history of transient ischemic attack (TIA), and cerebral infarction without residual deficits: Secondary | ICD-10-CM | POA: Diagnosis not present

## 2018-07-23 DIAGNOSIS — Z0000 Encounter for general adult medical examination without abnormal findings: Secondary | ICD-10-CM | POA: Diagnosis not present

## 2018-07-23 DIAGNOSIS — I712 Thoracic aortic aneurysm, without rupture: Secondary | ICD-10-CM | POA: Diagnosis not present

## 2018-07-23 DIAGNOSIS — I480 Paroxysmal atrial fibrillation: Secondary | ICD-10-CM | POA: Diagnosis not present

## 2018-07-23 DIAGNOSIS — F4321 Adjustment disorder with depressed mood: Secondary | ICD-10-CM | POA: Diagnosis not present

## 2018-07-23 DIAGNOSIS — E7800 Pure hypercholesterolemia, unspecified: Secondary | ICD-10-CM | POA: Diagnosis not present

## 2018-07-23 DIAGNOSIS — F321 Major depressive disorder, single episode, moderate: Secondary | ICD-10-CM | POA: Diagnosis not present

## 2018-07-23 DIAGNOSIS — G250 Essential tremor: Secondary | ICD-10-CM | POA: Diagnosis not present

## 2018-09-01 ENCOUNTER — Other Ambulatory Visit: Payer: Self-pay | Admitting: Thoracic Surgery (Cardiothoracic Vascular Surgery)

## 2018-09-01 DIAGNOSIS — R911 Solitary pulmonary nodule: Secondary | ICD-10-CM

## 2018-09-19 ENCOUNTER — Emergency Department (HOSPITAL_BASED_OUTPATIENT_CLINIC_OR_DEPARTMENT_OTHER): Payer: Medicare Other

## 2018-09-19 ENCOUNTER — Inpatient Hospital Stay (HOSPITAL_COMMUNITY)
Admission: EM | Admit: 2018-09-19 | Discharge: 2018-09-25 | DRG: 246 | Disposition: A | Discharge status: Home / Self Care | Payer: Medicare Other | Attending: Internal Medicine | Admitting: Internal Medicine

## 2018-09-19 ENCOUNTER — Emergency Department (HOSPITAL_COMMUNITY): Payer: Medicare Other

## 2018-09-19 ENCOUNTER — Encounter (HOSPITAL_COMMUNITY): Payer: Self-pay | Admitting: Emergency Medicine

## 2018-09-19 ENCOUNTER — Other Ambulatory Visit: Payer: Self-pay

## 2018-09-19 DIAGNOSIS — N183 Chronic kidney disease, stage 3 (moderate): Secondary | ICD-10-CM | POA: Diagnosis present

## 2018-09-19 DIAGNOSIS — R0600 Dyspnea, unspecified: Secondary | ICD-10-CM | POA: Diagnosis not present

## 2018-09-19 DIAGNOSIS — R0789 Other chest pain: Secondary | ICD-10-CM

## 2018-09-19 DIAGNOSIS — I959 Hypotension, unspecified: Secondary | ICD-10-CM | POA: Diagnosis present

## 2018-09-19 DIAGNOSIS — I2510 Atherosclerotic heart disease of native coronary artery without angina pectoris: Secondary | ICD-10-CM | POA: Diagnosis not present

## 2018-09-19 DIAGNOSIS — Z7982 Long term (current) use of aspirin: Secondary | ICD-10-CM

## 2018-09-19 DIAGNOSIS — I700 Atherosclerosis of aorta: Secondary | ICD-10-CM | POA: Diagnosis present

## 2018-09-19 DIAGNOSIS — I712 Thoracic aortic aneurysm, without rupture: Secondary | ICD-10-CM | POA: Diagnosis present

## 2018-09-19 DIAGNOSIS — I5042 Chronic combined systolic (congestive) and diastolic (congestive) heart failure: Secondary | ICD-10-CM | POA: Diagnosis present

## 2018-09-19 DIAGNOSIS — R0602 Shortness of breath: Secondary | ICD-10-CM | POA: Diagnosis not present

## 2018-09-19 DIAGNOSIS — Z87891 Personal history of nicotine dependence: Secondary | ICD-10-CM

## 2018-09-19 DIAGNOSIS — R1310 Dysphagia, unspecified: Secondary | ICD-10-CM | POA: Diagnosis present

## 2018-09-19 DIAGNOSIS — I200 Unstable angina: Secondary | ICD-10-CM | POA: Diagnosis present

## 2018-09-19 DIAGNOSIS — I708 Atherosclerosis of other arteries: Secondary | ICD-10-CM | POA: Diagnosis not present

## 2018-09-19 DIAGNOSIS — I2584 Coronary atherosclerosis due to calcified coronary lesion: Secondary | ICD-10-CM | POA: Diagnosis present

## 2018-09-19 DIAGNOSIS — I69334 Monoplegia of upper limb following cerebral infarction affecting left non-dominant side: Secondary | ICD-10-CM

## 2018-09-19 DIAGNOSIS — E785 Hyperlipidemia, unspecified: Secondary | ICD-10-CM | POA: Diagnosis not present

## 2018-09-19 DIAGNOSIS — Z8673 Personal history of transient ischemic attack (TIA), and cerebral infarction without residual deficits: Secondary | ICD-10-CM

## 2018-09-19 DIAGNOSIS — M79609 Pain in unspecified limb: Secondary | ICD-10-CM | POA: Diagnosis not present

## 2018-09-19 DIAGNOSIS — I359 Nonrheumatic aortic valve disorder, unspecified: Secondary | ICD-10-CM | POA: Diagnosis present

## 2018-09-19 DIAGNOSIS — F419 Anxiety disorder, unspecified: Secondary | ICD-10-CM | POA: Diagnosis present

## 2018-09-19 DIAGNOSIS — I7121 Aneurysm of the ascending aorta, without rupture: Secondary | ICD-10-CM

## 2018-09-19 DIAGNOSIS — I350 Nonrheumatic aortic (valve) stenosis: Secondary | ICD-10-CM | POA: Diagnosis present

## 2018-09-19 DIAGNOSIS — I25110 Atherosclerotic heart disease of native coronary artery with unstable angina pectoris: Principal | ICD-10-CM | POA: Diagnosis present

## 2018-09-19 DIAGNOSIS — R0989 Other specified symptoms and signs involving the circulatory and respiratory systems: Secondary | ICD-10-CM

## 2018-09-19 DIAGNOSIS — Z888 Allergy status to other drugs, medicaments and biological substances status: Secondary | ICD-10-CM

## 2018-09-19 DIAGNOSIS — N179 Acute kidney failure, unspecified: Secondary | ICD-10-CM

## 2018-09-19 DIAGNOSIS — R911 Solitary pulmonary nodule: Secondary | ICD-10-CM | POA: Diagnosis present

## 2018-09-19 DIAGNOSIS — R011 Cardiac murmur, unspecified: Secondary | ICD-10-CM | POA: Diagnosis present

## 2018-09-19 DIAGNOSIS — Z955 Presence of coronary angioplasty implant and graft: Secondary | ICD-10-CM

## 2018-09-19 DIAGNOSIS — I10 Essential (primary) hypertension: Secondary | ICD-10-CM

## 2018-09-19 DIAGNOSIS — I480 Paroxysmal atrial fibrillation: Secondary | ICD-10-CM | POA: Diagnosis present

## 2018-09-19 DIAGNOSIS — I5032 Chronic diastolic (congestive) heart failure: Secondary | ICD-10-CM | POA: Insufficient documentation

## 2018-09-19 DIAGNOSIS — Z9012 Acquired absence of left breast and nipple: Secondary | ICD-10-CM

## 2018-09-19 DIAGNOSIS — R9439 Abnormal result of other cardiovascular function study: Secondary | ICD-10-CM

## 2018-09-19 DIAGNOSIS — Z853 Personal history of malignant neoplasm of breast: Secondary | ICD-10-CM

## 2018-09-19 DIAGNOSIS — Z79899 Other long term (current) drug therapy: Secondary | ICD-10-CM

## 2018-09-19 DIAGNOSIS — R030 Elevated blood-pressure reading, without diagnosis of hypertension: Secondary | ICD-10-CM | POA: Diagnosis present

## 2018-09-19 LAB — CBC
HCT: 42.6 % (ref 36.0–46.0)
Hemoglobin: 13.4 g/dL (ref 12.0–15.0)
MCH: 29.7 pg (ref 26.0–34.0)
MCHC: 31.5 g/dL (ref 30.0–36.0)
MCV: 94.5 fL (ref 78.0–100.0)
PLATELETS: 259 10*3/uL (ref 150–400)
RBC: 4.51 MIL/uL (ref 3.87–5.11)
RDW: 13 % (ref 11.5–15.5)
WBC: 6.1 10*3/uL (ref 4.0–10.5)

## 2018-09-19 LAB — BASIC METABOLIC PANEL
ANION GAP: 8 (ref 5–15)
BUN: 20 mg/dL (ref 8–23)
CALCIUM: 8.8 mg/dL — AB (ref 8.9–10.3)
CO2: 25 mmol/L (ref 22–32)
CREATININE: 1.43 mg/dL — AB (ref 0.44–1.00)
Chloride: 107 mmol/L (ref 98–111)
GFR calc Af Amer: 38 mL/min — ABNORMAL LOW (ref >60)
GFR calc non Af Amer: 33 mL/min — ABNORMAL LOW (ref >60)
GLUCOSE: 101 mg/dL — AB (ref 70–99)
Potassium: 4.4 mmol/L (ref 3.5–5.1)
Sodium: 140 mmol/L (ref 135–145)

## 2018-09-19 LAB — I-STAT TROPONIN, ED
Troponin i, poc: 0 ng/mL (ref 0.00–0.08)
Troponin i, poc: 0.01 ng/mL (ref 0.00–0.08)

## 2018-09-19 LAB — D-DIMER, QUANTITATIVE: D-Dimer, Quant: 1.68 ug/mL-FEU — ABNORMAL HIGH (ref 0.00–0.50)

## 2018-09-19 LAB — TROPONIN I: Troponin I: <0.03 ng/mL (ref <0.03)

## 2018-09-19 MED ORDER — DOCUSATE SODIUM 100 MG PO CAPS
100.0000 mg | ORAL_CAPSULE | Freq: Every evening | ORAL | Status: DC
  Administered 2018-09-19 – 2018-09-24 (×5): 100 mg via ORAL
  Filled 2018-09-19 (×5): qty 1

## 2018-09-19 MED ORDER — VITAMIN B-12 1000 MCG PO TABS
1000.0000 ug | ORAL_TABLET | Freq: Every day | ORAL | Status: DC
  Administered 2018-09-20 – 2018-09-25 (×6): 1000 ug via ORAL
  Filled 2018-09-19 (×6): qty 1

## 2018-09-19 MED ORDER — ASPIRIN EC 81 MG PO TBEC
81.0000 mg | DELAYED_RELEASE_TABLET | Freq: Every day | ORAL | Status: DC
  Administered 2018-09-20 – 2018-09-24 (×4): 81 mg via ORAL
  Filled 2018-09-19 (×5): qty 1

## 2018-09-19 MED ORDER — SODIUM CHLORIDE 0.9 % IV BOLUS
500.0000 mL | Freq: Once | INTRAVENOUS | Status: AC
  Administered 2018-09-19: 500 mL via INTRAVENOUS

## 2018-09-19 MED ORDER — HEPARIN SODIUM (PORCINE) 5000 UNIT/ML IJ SOLN
5000.0000 [IU] | Freq: Three times a day (TID) | INTRAMUSCULAR | Status: DC
  Administered 2018-09-19 – 2018-09-20 (×3): 5000 [IU] via SUBCUTANEOUS
  Filled 2018-09-19 (×3): qty 1

## 2018-09-19 MED ORDER — MAGNESIUM HYDROXIDE 400 MG/5ML PO SUSP
15.0000 mL | Freq: Once | ORAL | Status: AC
  Administered 2018-09-19: 15 mL via ORAL
  Filled 2018-09-19: qty 30

## 2018-09-19 MED ORDER — NITROGLYCERIN 0.4 MG SL SUBL: 0.4000 mg | SUBLINGUAL_TABLET | SUBLINGUAL | Status: DC | PRN

## 2018-09-19 MED ORDER — ASPIRIN 81 MG PO CHEW
324.0000 mg | CHEWABLE_TABLET | Freq: Once | ORAL | Status: AC
  Administered 2018-09-19: 324 mg via ORAL
  Filled 2018-09-19: qty 4

## 2018-09-19 MED ORDER — PRAVASTATIN SODIUM 40 MG PO TABS
20.0000 mg | ORAL_TABLET | Freq: Every evening | ORAL | Status: DC
  Administered 2018-09-19 – 2018-09-24 (×5): 20 mg via ORAL
  Filled 2018-09-19 (×5): qty 1

## 2018-09-19 MED ORDER — SODIUM CHLORIDE 0.9 % IV SOLN
INTRAVENOUS | Status: DC
  Administered 2018-09-19: 21:00:00 via INTRAVENOUS

## 2018-09-19 NOTE — ED Provider Notes (Signed)
[name]   CSN: [id number] Arrival date & time: 09/19/18  2297     History   Chief Complaint Chief Complaint  Patient presents with  . Shortness of Breath  . Chest Pain    HPI Leslie Carr is a 84 y.o. female.  HPI Patient states she woke this morning around 7 AM with left-sided chest pressure shortness of breath and nausea.  Chest pressure has since improved.  She has mild nonproductive cough which is ongoing.  Complains of chronic left leg swelling which she does believe has changed recently.  Denies any fever or chills.  Had similar episode of the chest pressure 2 weeks ago that resolved spontaneously. Past Medical History:  Diagnosis Date  . Breast cancer ([place])   . CHF (congestive heart failure) ([place])   . Heart murmur   . History of bronchitis   . Hypercholesteremia   . Numbness and tingling in left hand   . Pleural effusion on left   . Shortness of breath dyspnea    "only going up a flight of stairs"  . Stroke [facility]) 2014   left sided weakness - mainly left hand    Patient Active Problem List   Diagnosis Date Noted  . CVA (cerebral vascular accident) ([place]) 04/22/2016  . PAF (paroxysmal atrial fibrillation) ([place]) 04/22/2016  . Aortic valve disease 04/22/2016  . Recurrent pleural effusion on left 04/17/2016  . Recurrent left pleural effusion 04/09/2016  . Lung nodule, solitary 04/09/2016  . Atelectasis 04/09/2016  . CVA (cerebral infarction) 04/09/2016  . Hyperlipidemia 04/09/2016  . Heart murmur 04/09/2016  . History of breast cancer 04/09/2016    Past Surgical History:  Procedure Laterality Date  . ABDOMINAL HYSTERECTOMY  1976  . CATARACT EXTRACTION Right   . COLONOSCOPY    . DECORTICATION Left 04/17/2016   Procedure: Visceral Pleural DECORTICATION;  Surgeon: Melrose Nakayama, MD;  Location: [place];  Service: Thoracic;  Laterality: Left;  . FOOT SURGERY Right    "bone spur removal"  .  MASTECTOMY Left 1985  . PLEURAL BIOPSY Left 04/17/2016   Procedure:  Left PLEURAL BIOPSY;  Surgeon: Melrose Nakayama, MD;  Location: [place];  Service: Thoracic;  Laterality: Left;  . PLEURAL EFFUSION DRAINAGE Left 04/17/2016   Procedure: DRAINAGE OF Left PLEURAL EFFUSION;  Surgeon: Melrose Nakayama, MD;  Location: [place];  Service: Thoracic;  Laterality: Left;  . TONSILLECTOMY    . VIDEO ASSISTED THORACOSCOPY Left 04/17/2016   Procedure:  Left VIDEO ASSISTED THORACOSCOPY with Drainage of Pleural Effusion, Pleural Biopsy, Visceral Pleural Decortication and Placement of On-Q pain pump;  Surgeon: Melrose Nakayama, MD;  Location: [place];  Service: Thoracic;  Laterality: Left;     OB History   None      Home Medications    Prior to Admission medications   Medication Sig Start Date End Date Taking? Authorizing Provider  aspirin EC 81 MG tablet Take 81 mg by mouth daily.   Yes [provider]  docusate sodium (COLACE) 100 MG capsule Take 100 mg by mouth every evening.   Yes [provider]  Multiple Vitamins-Minerals (EMERGEN-C IMMUNE PLUS PO) Take 1 packet by mouth daily.   Yes [provider]  pravastatin (PRAVACHOL) 20 MG tablet Take 20 mg by mouth every evening.    Yes [provider]  Propylene Glycol (SYSTANE COMPLETE OP) Apply to eye.   Yes [provider]  vitamin B-12 (CYANOCOBALAMIN) 1000  MCG tablet Take 1,000 mcg by mouth daily.   Yes [provider]    Family History Family History  Problem Relation Age of Onset  . Heart disease Neg Hx   . Heart attack Neg Hx   . Hypertension Neg Hx   . Hyperlipidemia Neg Hx   . Heart failure Neg Hx     Social History Social History   Tobacco Use  . Smoking status: Former Research scientist (life sciences)  . Smokeless tobacco: Never Used  . Tobacco comment: Quit over 40 years ago  Substance Use Topics  . Alcohol use: No    Alcohol/week: 0.0 standard drinks  . Drug use: No     Allergies     Lipitor [atorvastatin]   Review of Systems Review of Systems  Constitutional: Negative for chills and fever.  HENT: Negative for trouble swallowing.   Respiratory: Positive for cough and shortness of breath. Negative for wheezing.   Cardiovascular: Positive for chest pain and leg swelling. Negative for palpitations.  Gastrointestinal: Positive for nausea. Negative for abdominal pain, constipation, diarrhea and vomiting.  Genitourinary: Negative for dysuria, flank pain and frequency.  Musculoskeletal: Positive for myalgias. Negative for back pain, neck pain and neck stiffness.  Skin: Negative for rash and wound.  Neurological: Negative for dizziness, weakness, light-headedness, numbness and headaches.  All other systems reviewed and are negative.    Physical Exam Updated Vital Signs BP (!) 150/69   Pulse 73   Temp 98.1 F (36.7 C) (Oral)   Resp 14   Ht 5\' 2"  (1.575 m)   Wt 63 kg   SpO2 97%   BMI 25.42 kg/m   Physical Exam  Constitutional: She is oriented to person, place, and time. She appears well-developed and well-nourished. No distress.  HENT:  Head: Normocephalic and atraumatic.  Mouth/Throat: Oropharynx is clear and moist. No oropharyngeal exudate.  Eyes: Pupils are equal, round, and reactive to light. EOM are normal.  Neck: Normal range of motion. Neck supple. No JVD present.  Cardiovascular: Normal rate and regular rhythm. Exam reveals no gallop and no friction rub.  Murmur heard. Pulmonary/Chest: Effort normal and breath sounds normal. No stridor. No respiratory distress. She has no wheezes. She has no rales. She exhibits no tenderness.  Abdominal: Soft. Bowel sounds are normal. There is no tenderness. There is no rebound and no guarding.  Musculoskeletal: Normal range of motion. She exhibits edema and tenderness.  Left leg edema compared to right.  She has some tenderness over the lateral anterior portion of the lower leg on the left.  Mildly distended varicose  veins.  Distal pulses intact.  No midline thoracic or lumbar tenderness.  No CVA tenderness.  Lymphadenopathy:    She has no cervical adenopathy.  Neurological: She is alert and oriented to person, place, and time.  Moving all extremities without focal deficit.  Sensation intact.  Skin: Skin is warm and dry. Capillary refill takes less than 2 seconds. No rash noted. She is not diaphoretic. No erythema.  Psychiatric: She has a normal mood and affect. Her behavior is normal.  Nursing note and vitals reviewed.    ED Treatments / Results  Labs (all labs ordered are listed, but only abnormal results are displayed) Labs Reviewed  BASIC METABOLIC PANEL - Abnormal; Notable for the following components:      Result Value   Glucose, Bld 101 (*)    Creatinine, Ser 1.43 (*)    Calcium 8.8 (*)    GFR calc non Af Amer 33 (*)  GFR calc Af Amer 38 (*)    All other components within normal limits  D-DIMER, QUANTITATIVE (NOT AT [facility]) - Abnormal; Notable for the following components:   D-Dimer, Quant 1.68 (*)    All other components within normal limits  CBC  I-STAT TROPONIN, ED  I-STAT TROPONIN, ED    EKG EKG Interpretation  Date/Time:  Sunday September 19 2018 09:15:46 EDT Ventricular Rate:  78 PR Interval:  180 QRS Duration: 78 QT Interval:  400 QTC Calculation: 456 R Axis:   -7 Text Interpretation:  Normal sinus rhythm Cannot rule out Anterior infarct , age undetermined Abnormal ECG Confirmed by Julianne Rice [phone]) on 09/19/2018 3:35:48 PM   Radiology Dg Chest 2 View  Result Date: 09/19/2018 CLINICAL DATA:  Shortness of breath.  History of breast cancer. EXAM: CHEST - 2 VIEW COMPARISON:  None. FINDINGS: There is a 3 cm masslike region seen just inferior to the minor fissure on the right frontal view. No other definitive nodules or masses. Blunting of the left costophrenic angle could represent pleural thickening versus a tiny effusion. The heart, hila, mediastinum, lungs, and  pleura are otherwise unremarkable. Degenerative changes in the left shoulder. IMPRESSION: 3 cm masslike region in the right mid lung could represent a neoplasm versus a rounded infectious/inflammatory process. Recommend a CT scan for further evaluation. Electronically Signed   By: Dorise Bullion III M.D   On: 09/19/2018 10:56    Procedures Procedures (including critical care time)  Medications Ordered in ED Medications  nitroGLYCERIN (NITROSTAT) SL tablet 0.4 mg (has no administration in time range)  aspirin chewable tablet 324 mg (324 mg Oral Given 09/19/18 1156)  sodium chloride 0.9 % bolus 500 mL (0 mLs Intravenous Stopped 09/19/18 1406)     Initial Impression / Assessment and Plan / ED Course  I have reviewed the triage vital signs and the nursing notes.  Pertinent labs & imaging results that were available during my care of the patient were reviewed by me and considered in my medical decision making (see chart for details).    Left lower extremity DVT study without evidence of DVT.  Patient with mild elevation in creatinine.  Unable to get CT angios of chest.  Discussed with hospitalist.  Will see patient in the emergency department and admit.  Will likely need VQ scan.  Low suspicion for aortic dissection.  Patient is comfortable appearing with stable vital signs.   Final Clinical Impressions(s) / ED Diagnoses   Final diagnoses:  Dyspnea, unspecified type  Atypical chest pain    ED Discharge Orders    None       Julianne Rice, MD 09/19/18 1536

## 2018-09-19 NOTE — Progress Notes (Signed)
*  Preliminary Results* Left lower extremity venous duplex completed. Left lower extremity is negative for deep vein thrombosis. There is no evidence of left Baker's cyst.  09/19/2018 2:39 PM  Maudry Mayhew, MHA, RVT, RDCS, RDMS

## 2018-09-19 NOTE — H&P (Addendum)
History and Physical    Leslie Carr VEL:[id number] DOB: 1931-05-28 DOA: 09/19/2018  PCP: Corine Shelter, PA-C Consultants:  none Patient coming from: home- lives with  Chief Complaint: Chest pain  HPI: Leslie Carr is a 84 y.o. female with medical history significant for breast CA s/p mastectomy, CHF, HLD, CVA, PAF, L pleural effusion, lung nodule (benign), thoracic AA f/b cardiovascular surgery who presented to the ED today with c/o CP and SOB. She states that she has been more short of breath with increased DOE and decreased exercise tolerance for several weeks. She has also been woken up twice in the past couple weeks with heavy /pressurelike chest discomfort, associated with nausea. One of those times was this morning and so she decided to come to the ED for evaluation. The chest discomfort lasts several minutes, is not exacerbated or relieved by anything. It does not radiate. No diaphoresis. She reports that she has had a nonproductive cough for "a while" as well. No f/c/ns. She has had no weight loss. No palpitations, orthopnea, PND. She has chronic LLE edema from surgery that she reports is unchanged. She lost her husband of 69 years in July and endorses a significant amount of stress, anxiety and depression since that time. She reports that she believes some or all of her symptoms may be related to her anxiety/stress. She has no known h/o CAD but was told in the past she had "stage I CHF." No h/o arrhythmias. No h/o COPD. She was a one-two cigarette/day smoker but stopped entirely 40 years ago.   ED Course: VSS, SaO2 nL on RA. D-dimer 1.68. Creat 1.43 up from baseline of ~1.   Review of Systems: As per HPI; otherwise review of systems reviewed and negative.   Ambulatory Status:  Ambulates without assistance  Past Medical History:  Diagnosis Date  . Breast cancer ([place])   . CHF (congestive heart failure) ([place])   . Heart murmur   . History of bronchitis   . Hypercholesteremia   .  Numbness and tingling in left hand   . Pleural effusion on left   . Shortness of breath dyspnea    "only going up a flight of stairs"  . Stroke [facility]) 2014   left sided weakness - mainly left hand    Past Surgical History:  Procedure Laterality Date  . ABDOMINAL HYSTERECTOMY  1976  . CATARACT EXTRACTION Right   . COLONOSCOPY    . DECORTICATION Left 04/17/2016   Procedure: Visceral Pleural DECORTICATION;  Surgeon: Melrose Nakayama, MD;  Location: [place];  Service: Thoracic;  Laterality: Left;  . FOOT SURGERY Right    "bone spur removal"  . MASTECTOMY Left 1985  . PLEURAL BIOPSY Left 04/17/2016   Procedure:  Left PLEURAL BIOPSY;  Surgeon: Melrose Nakayama, MD;  Location: [place];  Service: Thoracic;  Laterality: Left;  . PLEURAL EFFUSION DRAINAGE Left 04/17/2016   Procedure: DRAINAGE OF Left PLEURAL EFFUSION;  Surgeon: Melrose Nakayama, MD;  Location: [place];  Service: Thoracic;  Laterality: Left;  . TONSILLECTOMY    . VIDEO ASSISTED THORACOSCOPY Left 04/17/2016   Procedure:  Left VIDEO ASSISTED THORACOSCOPY with Drainage of Pleural Effusion, Pleural Biopsy, Visceral Pleural Decortication and Placement of On-Q pain pump;  Surgeon: Melrose Nakayama, MD;  Location: [facility] OR;  Service: Thoracic;  Laterality: Left;    Social History   Socioeconomic History  . Marital status: Divorced    Spouse name: Not on file  . Number of children:  Not on file  . Years of education: Not on file  . Highest education level: Not on file  Occupational History  . Not on file  Social Needs  . Financial resource strain: Not on file  . Food insecurity:    Worry: Not on file    Inability: Not on file  . Transportation needs:    Medical: Not on file    Non-medical: Not on file  Tobacco Use  . Smoking status: Former Research scientist (life sciences)  . Smokeless tobacco: Never Used  . Tobacco comment: Quit over 40 years ago  Substance and Sexual Activity  . Alcohol use: No    Alcohol/week: 0.0 standard drinks  . Drug  use: No  . Sexual activity: Not on file  Lifestyle  . Physical activity:    Days per week: Not on file    Minutes per session: Not on file  . Stress: Not on file  Relationships  . Social connections:    Talks on phone: Not on file    Gets together: Not on file    Attends religious service: Not on file    Active member of club or organization: Not on file    Attends meetings of clubs or organizations: Not on file    Relationship status: Not on file  . Intimate partner violence:    Fear of current or ex partner: Not on file    Emotionally abused: Not on file    Physically abused: Not on file    Forced sexual activity: Not on file  Other Topics Concern  . Not on file  Social History Narrative  . Not on file    Allergies  Allergen Reactions  . Lipitor [Atorvastatin] Other (See Comments)    Dizzy and make her head feel huge     Family History  Problem Relation Age of Onset  . Heart disease Neg Hx   . Heart attack Neg Hx   . Hypertension Neg Hx   . Hyperlipidemia Neg Hx   . Heart failure Neg Hx     Prior to Admission medications   Medication Sig Start Date End Date Taking? Authorizing Provider  aspirin EC 81 MG tablet Take 81 mg by mouth daily.   Yes [provider]  docusate sodium (COLACE) 100 MG capsule Take 100 mg by mouth every evening.   Yes [provider]  Multiple Vitamins-Minerals (EMERGEN-C IMMUNE PLUS PO) Take 1 packet by mouth daily.   Yes [provider]  pravastatin (PRAVACHOL) 20 MG tablet Take 20 mg by mouth every evening.    Yes [provider]  Propylene Glycol (SYSTANE COMPLETE OP) Apply to eye.   Yes [provider]  vitamin B-12 (CYANOCOBALAMIN) 1000 MCG tablet Take 1,000 mcg by mouth daily.   Yes [provider]    Physical Exam: Vitals:   09/19/18 1100 09/19/18 1200 09/19/18 1330 09/19/18 1400  BP: 140/67 127/72 (!) 165/79 (!) 150/69  Pulse: 74 75 73 73  Resp: 16 16 16 14   Temp:        TempSrc:      SpO2: 100% 97% 100% 97%  Weight:      Height:         . General:  Appears calm and comfortable and is in NAD; occasionally appears anxious/tearful but has appropriate and full range of affect . Eyes:  PERRL, EOMI, normal lids, iris . ENT:  grossly normal hearing, lips & tongue, mmm; appropriate dentition . Neck:  no LAD, masses  or thyromegaly; no carotid bruits . Cardiovascular:  normal rate, reg rhythm, 3/6 systolic M at RUSB without radiation . Respiratory:  CTA bilaterally with no wheezes/rales/rhonchi.  Normal respiratory effort. . Abdomen:  soft, NT, ND, NABS . Back:   grossly normal alignment, no CVAT . Skin:  no rash or induration seen on limited exam . Musculoskeletal:  grossly normal tone BUE/BLE, good ROM, no bony abnormality or obvious joint deformity . Lower extremity:  No LE edema.  Limited foot exam with no ulcerations.  2+ distal pulses. Marland Kitchen Psychiatric:  grossly normal mood and affect, speech fluent and appropriate, AOx3 . Neurologic:  CN 2-12 grossly intact, moves all extremities in coordinated fashion, sensation intact    Radiological Exams on Admission: Dg Chest 2 View  Result Date: 09/19/2018 CLINICAL DATA:  Shortness of breath.  History of breast cancer. EXAM: CHEST - 2 VIEW COMPARISON:  None. FINDINGS: There is a 3 cm masslike region seen just inferior to the minor fissure on the right frontal view. No other definitive nodules or masses. Blunting of the left costophrenic angle could represent pleural thickening versus a tiny effusion. The heart, hila, mediastinum, lungs, and pleura are otherwise unremarkable. Degenerative changes in the left shoulder. IMPRESSION: 3 cm masslike region in the right mid lung could represent a neoplasm versus a rounded infectious/inflammatory process. Recommend a CT scan for further evaluation. Electronically Signed   By: Dorise Bullion III M.D   On: 09/19/2018 10:56    EKG: Independently reviewed.  NSR with rate 78; poor  RWP in anterior leads; o/w no evidence of acute ischemia   Labs on Admission: I have personally reviewed the available labs and imaging studies at the time of the admission.  Pertinent labs:  Creat 1.43 (up from baseline ~1) TnI 0.00 D-dimer 1.68 WBC 6.1 Hgb 13.4  02/2016 TTE  - Technically difficult study. LVEF 48%, diffuse hypokinesis,   moderate LVH, disatolic dysfunction, elevated LV filling   pressure, bilateral pleural effusions, normal LA size, mildly   increased aortic valve velocity.  Assessment/Plan Principal Problem:   Chest pain Active Problems:   Lung nodule, solitary   Status post CVA   PAF (paroxysmal atrial fibrillation) (H[facility])   Aortic valve disease   High blood pressure   Ascending aortic aneurysm (HCC)   Chronic combined systolic (congestive) and diastolic (congestive) heart failure (HCC)   CAD (coronary artery disease)   AKI (acute kidney injury) (HCC)   Chest pain: she has known CAD from a cardiac CT; in the past she has been tried on a beta blocker and ACE but she could not tolerate these due to weakness/fatigue. She has never had a stress test. I feel this is warranted given the nature of her pain as well as her risk factors. -NPO after MN for exercise stress test. Pt believes she can walk on treadmill sufficiently to achieve target HR; if not will need pharm stress -trend troponin -NTG prn chest pain -cont statin -check HbA1C, lipid panel  Elevated BP without h/o HTN -monitor for now -may need to be started on Rx; she has not tolerated BB or ACE in the past.   Elevated D-dimer -Given her chest pain, dyspnea and elevated D-dimer, will obtain V/Q scan  LE dopplers neg for DVT. Cannot get chest CTA given AKI.  AKI: pt states she has not been drinking enough fluids; etiology is likely prerenal vs CKD that has developed since 2017. -IVF; repeat BMP in a.m.  Chronic combined systolic and diastolic  CHF -no evidence of decompensation on exam    -monitor clinically  pAF  -pt had been on xarelto in the past; it is unclear why she is no longer on this -currently in NSR -cont ASA 81 mg -will need to determine why no longer on [facility]  Thoracic AA -followed by cardiovascular surgery; cont surveillance -needs better BP control  S/p CVA -cont statin  Lung nodule, chronic, benign on biopsy -cont outpatient surveillance q 6 mo   DVT prophylaxis:  Heparin [place] Code Status:  Full - confirmed with patient/family Family Communication: daughter and son-in-law at bedside  Disposition Plan:  Home once clinically improved; potential d/c tomorrow after stress and V/Q Consults called: none  Admission status: It is my clinical opinion that referral for OBSERVATION is reasonable and necessary in this patient based on the above information provided. The aforementioned taken together are felt to place the patient at high risk for further clinical deterioration. However it is anticipated that the patient may be medically stable for discharge from the hospital within 24 to 48 hours.     Janora Norlander MD [facility]  If note is complete, please contact covering daytime or nighttime physician. [email or web address] Password [facility]  09/19/2018, 3:22 PM

## 2018-09-19 NOTE — ED Triage Notes (Signed)
Pt., daughter stated, I started having SOB and chest pain this morning , it happened about 2 weeks ago.

## 2018-09-19 NOTE — ED Notes (Signed)
Pt family states that she is leaving, another family member will arrive soon. Daughter Santiago Glad) can be reached at [phone], already aware of pt bed on 5C.

## 2018-09-19 NOTE — ED Notes (Signed)
Ordered diet tray for pt  

## 2018-09-20 ENCOUNTER — Observation Stay (HOSPITAL_COMMUNITY): Payer: Medicare Other

## 2018-09-20 ENCOUNTER — Encounter (HOSPITAL_COMMUNITY): Payer: Self-pay | Admitting: Internal Medicine

## 2018-09-20 DIAGNOSIS — I359 Nonrheumatic aortic valve disorder, unspecified: Secondary | ICD-10-CM

## 2018-09-20 DIAGNOSIS — R079 Chest pain, unspecified: Secondary | ICD-10-CM

## 2018-09-20 DIAGNOSIS — R0600 Dyspnea, unspecified: Secondary | ICD-10-CM | POA: Diagnosis not present

## 2018-09-20 DIAGNOSIS — I200 Unstable angina: Secondary | ICD-10-CM

## 2018-09-20 DIAGNOSIS — I10 Essential (primary) hypertension: Secondary | ICD-10-CM

## 2018-09-20 DIAGNOSIS — R911 Solitary pulmonary nodule: Secondary | ICD-10-CM | POA: Diagnosis not present

## 2018-09-20 DIAGNOSIS — I2510 Atherosclerotic heart disease of native coronary artery without angina pectoris: Secondary | ICD-10-CM | POA: Diagnosis not present

## 2018-09-20 LAB — BASIC METABOLIC PANEL
Anion gap: 5 (ref 5–15)
BUN: 15 mg/dL (ref 8–23)
CO2: 25 mmol/L (ref 22–32)
Calcium: 8.3 mg/dL — ABNORMAL LOW (ref 8.9–10.3)
Chloride: 111 mmol/L (ref 98–111)
Creatinine, Ser: 1.17 mg/dL — ABNORMAL HIGH (ref 0.44–1.00)
GFR calc Af Amer: 48 mL/min — ABNORMAL LOW (ref >60)
GFR calc non Af Amer: 42 mL/min — ABNORMAL LOW (ref >60)
Glucose, Bld: 92 mg/dL (ref 70–99)
Potassium: 4.1 mmol/L (ref 3.5–5.1)
Sodium: 141 mmol/L (ref 135–145)

## 2018-09-20 LAB — CBC
HCT: 39.8 % (ref 36.0–46.0)
Hemoglobin: 12.8 g/dL (ref 12.0–15.0)
MCH: 30.3 pg (ref 26.0–34.0)
MCHC: 32.2 g/dL (ref 30.0–36.0)
MCV: 94.1 fL (ref 78.0–100.0)
Platelets: 218 10*3/uL (ref 150–400)
RBC: 4.23 MIL/uL (ref 3.87–5.11)
RDW: 13.2 % (ref 11.5–15.5)
WBC: 4.3 10*3/uL (ref 4.0–10.5)

## 2018-09-20 LAB — NM MYOCAR MULTI W/SPECT W/WALL MOTION / EF
CSEPPHR: 111 {beats}/min
Rest HR: 97 {beats}/min

## 2018-09-20 LAB — LIPID PANEL
Cholesterol: 143 mg/dL (ref 0–200)
HDL: 36 mg/dL — ABNORMAL LOW (ref >40)
LDL Cholesterol: 90 mg/dL (ref 0–99)
Total CHOL/HDL Ratio: 4 RATIO
Triglycerides: 85 mg/dL (ref <150)
VLDL: 17 mg/dL (ref 0–40)

## 2018-09-20 LAB — HEMOGLOBIN A1C
Hgb A1c MFr Bld: 5.7 % — ABNORMAL HIGH (ref 4.8–5.6)
Mean Plasma Glucose: 116.89 mg/dL

## 2018-09-20 MED ORDER — REGADENOSON 0.4 MG/5ML IV SOLN
INTRAVENOUS | Status: AC
  Administered 2018-09-20: 0.08 mg
  Filled 2018-09-20: qty 5

## 2018-09-20 MED ORDER — TECHNETIUM TC 99M TETROFOSMIN IV KIT
10.0000 | PACK | Freq: Once | INTRAVENOUS | Status: AC | PRN
  Administered 2018-09-20: 10 via INTRAVENOUS

## 2018-09-20 MED ORDER — SODIUM CHLORIDE 0.9 % IV SOLN
250.0000 mL | INTRAVENOUS | Status: DC | PRN
  Administered 2018-09-20: 250 mL via INTRAVENOUS

## 2018-09-20 MED ORDER — TECHNETIUM TC 99M TETROFOSMIN IV KIT
30.0000 | PACK | Freq: Once | INTRAVENOUS | Status: AC | PRN
  Administered 2018-09-20: 30 via INTRAVENOUS

## 2018-09-20 MED ORDER — DIPHENHYDRAMINE HCL 25 MG PO CAPS
50.0000 mg | ORAL_CAPSULE | Freq: Once | ORAL | Status: AC
  Administered 2018-09-20: 50 mg via ORAL
  Filled 2018-09-20: qty 2

## 2018-09-20 MED ORDER — DIPHENHYDRAMINE HCL 25 MG PO CAPS: 25.0000 mg | ORAL_CAPSULE | Freq: Once | ORAL | Status: DC

## 2018-09-20 MED ORDER — HEPARIN (PORCINE) IN NACL 100-0.45 UNIT/ML-% IJ SOLN
650.0000 [IU]/h | INTRAMUSCULAR | Status: DC
  Administered 2018-09-20: 900 [IU]/h via INTRAVENOUS
  Filled 2018-09-20: qty 250

## 2018-09-20 MED ORDER — HEPARIN BOLUS VIA INFUSION
2000.0000 [IU] | Freq: Once | INTRAVENOUS | Status: AC
  Administered 2018-09-20: 2000 [IU] via INTRAVENOUS
  Filled 2018-09-20: qty 2000

## 2018-09-20 NOTE — Consult Note (Addendum)
Cardiology Consultation:   Patient ID: Leslie Carr; [id number]; Jul 14, 1931   Admit date: 09/19/2018 Date of Consult: 09/20/2018  Primary Care Provider: Corine Shelter, PA-C Primary Cardiologist: Dr. Tamala Julian but has not been seen since 2017 Primary Electrophysiologist:  None   Patient Profile:   Leslie Carr is a 84 y.o. female with a CAD (noted to have coronary calcifications in LAD/RCA on CT 2017), chronic combined CHF (last echo 2017 with EF 48% and G1DD), and paroxysmal atrial fibrillation not on anticoagulation, AAA, prior CVA, breast cancer with recurrent pleural effusions s/p L pleural decortication 2017, who is being seen today for the evaluation of chest pain at the request of Dr. Eliseo Squires.  History of Present Illness:   Ms. Petitti reports significant functional decline since her husbands death in 03-Jul-2023. She states she has been feeling DOE with walking or climbing a flight of stairs but attributes this to inactivity and deconditioning. In the past 2 weeks, she has had two episodes of chest pain which she describes as a pressure sensation that awakens her from sleep. The first occurrence was associated with a nose bleed. The second occurrence (09/19/18) was associated with nausea but no vomiting. She denies associated diaphoresis, SOB, dizziness, lightheadedness, or syncope with either episode. Both resolved spontaneously within 30 minutes. Given repeat occurrence on 09/19/18, she decided to present to the ED for further evaluation.   She was last seen outpatient by cardiology, Richardson Dopp, PA-C, 04/2016 in follow up of recent hospitalization. She had multiple complaints regarding medications including hypotension, weakness, and nausea; at that point and was taken off amiodarone, BBlocker, and ACEi. She was recommended for stress testing for risk stratification at some point in the future but was without anginal complaints. She was lost to follow-up at that time. She has never undergone an  ischemic evaluation.   At the time of this evaluation she is chest pain free. Only complaint is hunger at this time. She attributes her symptoms to having a broken heart. She is tearful when talking about her late husband of 29 years. Prior to his death she was fairly active and did not experience anginal complaints. She acknowledges her prior cardiac history listed about but states she does not tolerate medications so has not taken any medications for CHF or history of atrial fibrillation. She denies recent illness, orthopnea, PND, LE edema.   Hospital course: BP elevated with SBP in the 160s yesterday afternoon; otherwise VSS. Labs notable for electrolytes wnl, Cr 1.43>1.17 (baseline ~1), CBC wnl, LDL 90, Trop negative x3, HgbA1C 5.7, Ddimer 1.68. EKG with sinus rhythm with non-specific T wave abnormalities; no STE/D. CXR with 3cm mass in RML (neoplasm vs infection vs inflammation); recommended for CT scan to further evaluate. LE doppler without DVT. Patient was admitted to medicine. Cardiology asked to evaluate for chest pain.  Past Medical History:  Diagnosis Date  . 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 Breast cancer ([place])   . CHF (congestive heart failure) ([place])   . Heart murmur   . History of bronchitis   . Hypercholesteremia   . Numbness and tingling in left hand   . Pleural effusion on left   . Shortness of breath dyspnea    "only going up a flight of stairs"  . Stroke [facility]) 2014   left sided weakness - mainly left hand    Past Surgical History:  Procedure Laterality Date  . ABDOMINAL HYSTERECTOMY  1976  . CATARACT  EXTRACTION Right   . COLONOSCOPY    . DECORTICATION Left 04/17/2016   Procedure: Visceral Pleural DECORTICATION;  Surgeon: Melrose Nakayama, MD;  Location: [place];  Service: Thoracic;  Laterality: Left;  . FOOT SURGERY Right    "bone spur removal"  . MASTECTOMY Left 1985  . PLEURAL BIOPSY Left 04/17/2016   Procedure:  Left PLEURAL BIOPSY;   Surgeon: Melrose Nakayama, MD;  Location: [place];  Service: Thoracic;  Laterality: Left;  . PLEURAL EFFUSION DRAINAGE Left 04/17/2016   Procedure: DRAINAGE OF Left PLEURAL EFFUSION;  Surgeon: Melrose Nakayama, MD;  Location: [place];  Service: Thoracic;  Laterality: Left;  . TONSILLECTOMY    . VIDEO ASSISTED THORACOSCOPY Left 04/17/2016   Procedure:  Left VIDEO ASSISTED THORACOSCOPY with Drainage of Pleural Effusion, Pleural Biopsy, Visceral Pleural Decortication and Placement of On-Q pain pump;  Surgeon: Melrose Nakayama, MD;  Location: [place];  Service: Thoracic;  Laterality: Left;     Home Medications:  Prior to Admission medications   Medication Sig Start Date End Date Taking? Authorizing Provider  aspirin EC 81 MG tablet Take 81 mg by mouth daily.   Yes [provider]  docusate sodium (COLACE) 100 MG capsule Take 100 mg by mouth every evening.   Yes [provider]  Multiple Vitamins-Minerals (EMERGEN-C IMMUNE PLUS PO) Take 1 packet by mouth daily.   Yes [provider]  pravastatin (PRAVACHOL) 20 MG tablet Take 20 mg by mouth every evening.    Yes [provider]  Propylene Glycol (SYSTANE COMPLETE OP) Apply to eye.   Yes [provider]  vitamin B-12 (CYANOCOBALAMIN) 1000 MCG tablet Take 1,000 mcg by mouth daily.   Yes [provider]    Inpatient Medications: Scheduled Meds: . aspirin EC  81 mg Oral Daily  . docusate sodium  100 mg Oral QPM  . heparin  5,000 Units Subcutaneous Q8H  . pravastatin  20 mg Oral QPM  . vitamin B-12  1,000 mcg Oral Daily   Continuous Infusions:  PRN Meds: nitroGLYCERIN  Allergies:    Allergies  Allergen Reactions  . Lipitor [Atorvastatin] Other (See Comments)    Dizzy and make her head feel huge     Social History:   Social History   Socioeconomic History  . Marital status: Divorced    Spouse name: Not on file  . Number of children: Not on file  . Years of education: Not on  file  . Highest education level: Not on file  Occupational History  . Not on file  Social Needs  . Financial resource strain: Not hard at all  . Food insecurity:    Worry: Never true    Inability: Never true  . Transportation needs:    Medical: No    Non-medical: No  Tobacco Use  . Smoking status: Former Research scientist (life sciences)  . Smokeless tobacco: Never Used  . Tobacco comment: Quit over 40 years ago  Substance and Sexual Activity  . Alcohol use: No    Alcohol/week: 0.0 standard drinks  . Drug use: No  . Sexual activity: Not on file  Lifestyle  . Physical activity:    Days per week: 7 days    Minutes per session: 30 min  . Stress: Only a little  Relationships  . Social connections:    Talks on phone: More than three times a week    Gets together: More than three times a week    Attends religious service: 1 to  4 times per year    Active member of club or organization: No    Attends meetings of clubs or organizations: Never    Relationship status: Divorced  . Intimate partner violence:    Fear of current or ex partner: No    Emotionally abused: No    Physically abused: No    Forced sexual activity: No  Other Topics Concern  . Not on file  Social History Narrative  . Not on file    Family History:    Family History  Problem Relation Age of Onset  . Heart disease Neg Hx   . Heart attack Neg Hx   . Hypertension Neg Hx   . Hyperlipidemia Neg Hx   . Heart failure Neg Hx      ROS:  Please see the history of present illness.   All other ROS reviewed and negative.     Physical Exam/Data:   Vitals:   09/19/18 1400 09/19/18 1630 09/19/18 1800 09/20/18 0500  BP: (!) 150/69 (!) 166/75 117/78   Pulse: 73 64 90 66  Resp: 14 14 16 19   Temp:   98.7 F (37.1 C) 98 F (36.7 C)  TempSrc:   Oral Oral  SpO2: 97% 96% 96% 98%  Weight:      Height:       No intake or output data in the 24 hours ending 09/20/18 0929 Filed Weights   09/19/18 0917  Weight: 63 kg   Body mass index  is 25.42 kg/m.  General:  Well nourished, well developed, laying in bed in no acute distress. Appears younger than stated age 47: sclera anicteric  Neck: no JVD Vascular: No carotid bruits; distal pulses 2+ bilaterally Cardiac:  normal S1, S2; RRR; +murmur, no gallops or rubs Lungs:  clear to auscultation bilaterally, no wheezing, rhonchi or rales  Abd: NABS, soft, nontender, no hepatomegaly Ext: no edema Musculoskeletal:  No deformities, BUE and BLE strength normal and equal Skin: warm and dry  Neuro:  CNs 2-12 intact, no focal abnormalities noted Psych:  Normal affect   EKG:  The EKG was personally reviewed and demonstrates:  sinus rhythm with non-specific T wave abnormalities; no STE/D. Telemetry:  Telemetry was personally reviewed and demonstrates:  Sinus rhythm with frequent artifact. No evidence of arrhythmias  Relevant CV Studies:  Echocardiogram 2017: Study Conclusions  - Procedure narrative: Transthoracic echocardiography. Technically   difficult study. - Left ventricle: The cavity size was normal. There was moderate   concentric hypertrophy. Systolic function was normal. The   estimated ejection fraction was 48%. Diffuse hypokinesis. Doppler   parameters are consistent with abnormal left ventricular   relaxation (grade 1 diastolic dysfunction). The E/e&' ratio is   >15, suggesting elevated LV filling pressure. - Aortic valve: Poorly visualized. Transvalvular velocity was   minimally increased. - Left atrium: The atrium was normal in size. - Pericardium, extracardiac: There was a right pleural effusion.   There was a left pleural effusion.  Impressions:  - Technically difficult study. LVEF 48%, diffuse hypokinesis,   moderate LVH, disatolic dysfunction, elevated LV filling   pressure, bilateral pleural effusions, normal LA size, mildly   increased aortic valve velocity.  Laboratory Data:  Chemistry Recent Labs  Lab 09/19/18 0926 09/20/18 0705  NA 140  141  K 4.4 4.1  CL 107 111  CO2 25 25  GLUCOSE 101* 92  BUN 20 15  CREATININE 1.43* 1.17*  CALCIUM 8.8* 8.3*  GFRNONAA 33* 42*  GFRAA 38* 48*  ANIONGAP 8 5    No results for input(s): PROT, ALBUMIN, AST, ALT, ALKPHOS, BILITOT in the last 168 hours. Hematology Recent Labs  Lab 09/19/18 0926 09/20/18 0705  WBC 6.1 4.3  RBC 4.51 4.23  HGB 13.4 12.8  HCT 42.6 39.8  MCV 94.5 94.1  MCH 29.7 30.3  MCHC 31.5 32.2  RDW 13.0 13.2  PLT 259 218   Cardiac Enzymes Recent Labs  Lab 09/19/18 1835  TROPONINI <0.03    Recent Labs  Lab 09/19/18 0930 09/19/18 1445  TROPIPOC 0.00 0.01    BNPNo results for input(s): BNP, PROBNP in the last 168 hours.  DDimer  Recent Labs  Lab 09/19/18 1126  DDIMER 1.68*    Radiology/Studies:  Dg Chest 2 View  Result Date: 09/19/2018 CLINICAL DATA:  Shortness of breath.  History of breast cancer. EXAM: CHEST - 2 VIEW COMPARISON:  None. FINDINGS: There is a 3 cm masslike region seen just inferior to the minor fissure on the right frontal view. No other definitive nodules or masses. Blunting of the left costophrenic angle could represent pleural thickening versus a tiny effusion. The heart, hila, mediastinum, lungs, and pleura are otherwise unremarkable. Degenerative changes in the left shoulder. IMPRESSION: 3 cm masslike region in the right mid lung could represent a neoplasm versus a rounded infectious/inflammatory process. Recommend a CT scan for further evaluation. Electronically Signed   By: Dorise Bullion III M.D   On: 09/19/2018 10:56    Assessment and Plan:   1. Chest pain in patient with CAD noted on prior CT chest: patient reports 2 episodes of chest pressure which woke her from sleep in the past 2 weeks, most recently the morning of 09/19/18. No prior ischemic evaluation in the past. Here troponins have been negative, EKG non-ischemic.  - Will plan for NST to further evaluate chest pain - Will check an echocardiogram given history of mild  systolic dysfunction and increased stress with loss of her husband.  - If NST and echo are normal, do not anticipate further cardiac work-up.  - Continue aspirin and statin for risk factor modifications.   2. DOE in patient with history of chronic combined CHF: last echo 2017 with EF 48% and G1DD. She was intolerant to BBlocker and ACEi at that time due to weakness and hypotension. She has no volume overload complaints and appears euvolemic on exam.  - Will check an echocardiogram to evaluate LV function and murmur on exam  3. Paroxysmal atrial fibrillation: occurred post-operatively in 2017. Was on amiodarone and xarelto for a brief stent but both stopped due to intolerance. No evidence of recurrence on EKGs or telemetry. No complaints of palpitations.  - Will hold off on anticoagulation at this time. Low threshold for starting apixaban if reoccurs given elevated stroke risk.   4. AAA: follows outpatient with Vascular surgery - Continue routine outpatient monitoring  5. Murmur: suspect AS. Aortic valve poorly visualized on last echo in 2017 - Will check an echocardiogram this admission  6. Abnormal CXR and elevated Ddimer: nodule noted in RML on CXR, recommended for CT Chest to further characterize. Ddimer also elevated. LE duplex negative for DVT.  - Will defer additional work-up to primary team.    For questions or updates, please contact [place] Please consult [email or web address] for contact info under Cardiology/STEMI.   Signed, Abigail Butts, PA-C  09/20/2018 9:29 AM [phone]  Patient examined chart reviewed Discussed care with daughters patient and PA. Atypica pain with normal ECG. She does  not walk up hill well. She has a mild/moderate AS murmur on exam with clear lungs soft abdomen and looks younger than stated age. Husband of 43 years died in 2023-07-22 and she has been working through this. Plan is for lexiscan myouve and TTE to further assess chest pain and murmur  Jenkins Rouge

## 2018-09-20 NOTE — Progress Notes (Signed)
    Nuclear stress test results as follows:  There was no ST segment deviation noted during stress.  Defect 1: There is a large defect of severe severity.  Findings consistent with ischemia.  This is a high risk study.  Nuclear stress EF: 63%.  No T wave inversion was noted during stress.   Large size, severe intensity reversible apical and anterolateral perfusion defect suggestive of ischemia. LVEF 63% with normal wall motion. This is a high risk study.  A/P: Discussed abnormal results with the patient and need for definitive evaluation with a LHC. The patient understands that risks included but are not limited to stroke (1 in 1000), death (1 in 17), kidney failure [usually temporary] (1 in 500), bleeding (1 in 200), allergic reaction [possibly serious] (1 in 200).  The patient understands and agrees to proceed. - Will plan for LHC tomorrow - NPO after MN - Given size of defect, will start a heparin gtt per pharmacy for ACS.   Abigail Butts, PA-C

## 2018-09-20 NOTE — Progress Notes (Signed)
    Patient presented for Lexiscan nuclear stress test. Tolerated procedure well. Pending final stress imaging result.  Daune Perch, AGNP-C 09/20/2018  12:38 PM Pager: [phone]

## 2018-09-20 NOTE — Care Management Obs Status (Deleted)
[facility] NOTIFICATION   Patient Details  Name: Leslie Carr MRN: [id number] Date of Birth: 15-Jan-1931   Medicare Observation Status Notification Given:  Yes Patient wanted NCM to speak to her daughter [name] via phone, with Medicare OBS notice explained and permission given for NCM to sign, with printed copy left in patient's room.    Midge Minium RN, BSN, NCM-BC, ACM-RN [phone] 09/20/2018, 3:58 PM

## 2018-09-20 NOTE — Progress Notes (Signed)
Progress Note    Leslie Carr  OEV:[id number] DOB: [birth year]  DOA: 09/19/2018 PCP: Camille Bal, PA-C    Brief Narrative:     Medical records reviewed and are as summarized below:  Leslie Carr is an 84 y.o. female with medical history significant for breast CA s/p mastectomy, CHF, HLD, CVA, PAF, L pleural effusion, lung nodule (benign), thoracic AA f/b cardiovascular surgery who presented to the ED today with c/o CP and SOB. She states that she has been more short of breath with increased DOE and decreased exercise tolerance for several weeks. She has also been woken up twice in the past couple weeks with heavy /pressurelike chest discomfort, associated with nausea. One of those times was this morning and so she decided to come to the ED for evaluation. The chest discomfort lasts several minutes, is not exacerbated or relieved by anything. It does not radiate. No diaphoresis. She reports that she has had a nonproductive cough for "a while" as well. No f/c/ns. She has had no weight loss. No palpitations, orthopnea, PND. She has chronic LLE edema from surgery that she reports is unchanged. She lost her husband of 69 years in July and endorses a significant amount of stress, anxiety and depression since that time. She reports that she believes some or all of her symptoms may be related to her anxiety/stress. She has no known h/o CAD but was told in the past she had "stage I CHF." No h/o arrhythmias. No h/o COPD. She was a one-two cigarette/day smoker but stopped entirely 40 years ago.   Assessment/Plan:   Principal Problem:   Chest pain Active Problems:   Lung nodule, solitary   Status post CVA   PAF (paroxysmal atrial fibrillation) ([facility])   Aortic valve disease   High blood pressure   Ascending aortic aneurysm ([facility])   Chronic combined systolic (congestive) and diastolic (congestive) heart failure ([facility])   CAD (coronary artery disease)   AKI (acute kidney injury) ([place])      Chest pain: she has known CAD from a cardiac CT; in the past she has been tried on a beta blocker and ACE but she could not tolerate these due to weakness/fatigue.  -cardiology consult -stress test results pending -echo pending  Esophageal issues- Not sure of diagnosis but has to chew food very well or vomits  Elevated BP  -monitor for now -may need to be started on Rx; she has not tolerated BB or ACE in the past.   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 Code Status: Full Code.  Family Communication:  Disposition Plan:    Medical Consultants:    cards   Anti-Infectives:    None  Subjective:   hungry  Objective:    Vitals:   09/20/18 1233 09/20/18  1234 09/20/18 1236 09/20/18 1237  BP:  (!) 170/73  (!) 171/74  Pulse: 100 (!) 108 (!) 111 (!) 104  Resp:      Temp:      TempSrc:      SpO2:      Weight:      Height:       No intake or output data in the 24 hours ending 09/20/18 1405 Filed Weights   09/19/18 0917  Weight: 63 kg    Exam: In bed, NAD- talkative Short term memory impaired rrr No wheezing, no increase work of breathing  Data Reviewed:   I have personally reviewed following labs and imaging studies:  Labs: Labs show the following:    Basic Metabolic Panel: Recent Labs  Lab 09/19/18 0926 09/20/18 0705  NA 140 141  K 4.4 4.1  CL 107 111  CO2 25 25  GLUCOSE 101* 92  BUN 20 15  CREATININE 1.43* 1.17*  CALCIUM 8.8* 8.3*   GFR Estimated Creatinine Clearance: 31.2 mL/min (A) (by C-G formula based on SCr of 1.17 mg/dL (H)). Liver Function Tests: No results for input(s): AST, ALT, ALKPHOS, BILITOT, PROT, ALBUMIN in the last 168 hours. No results for input(s): LIPASE, AMYLASE in the last 168 hours. No results for input(s): AMMONIA in the last 168 hours. Coagulation profile No results for input(s): INR, PROTIME in the last 168 hours.  CBC: Recent Labs  Lab 09/19/18 0926 09/20/18 0705  WBC 6.1 4.3  HGB 13.4 12.8  HCT 42.6 39.8  MCV 94.5 94.1  PLT 259 218   Cardiac Enzymes: Recent Labs  Lab 09/19/18 1835  TROPONINI <0.03   BNP (last 3 results) No results for input(s): PROBNP in the last 8760 hours. CBG: No results for input(s): GLUCAP in the last 168 hours. D-Dimer: Recent Labs    09/19/18 1126  DDIMER 1.68*   Hgb A1c: Recent Labs    09/20/18 0705  HGBA1C 5.7*   Lipid Profile: Recent Labs    09/20/18 0705  CHOL 143  HDL 36*  LDLCALC 90  TRIG 85  CHOLHDL 4.0   Thyroid function studies: No results for input(s): TSH, T4TOTAL, T3FREE, THYROIDAB in the last 72 hours.  Invalid input(s): FREET3 Anemia work up: No results for input(s): VITAMINB12, FOLATE, FERRITIN, TIBC, IRON, RETICCTPCT in the last 72 hours. Sepsis Labs: Recent Labs  Lab 09/19/18 0926 09/20/18 0705  WBC 6.1 4.3    Microbiology No results found for this or any previous visit (from the past 240 hour(s)).  Procedures and diagnostic studies:  Dg Chest 2 View  Result Date: 09/19/2018 CLINICAL DATA:  Shortness of breath.  History of breast cancer. EXAM: CHEST - 2 VIEW COMPARISON:  None. FINDINGS: There is a 3 cm masslike region seen just inferior to the minor fissure on the right frontal view. No other definitive  nodules or masses. Blunting of the left costophrenic angle could represent pleural thickening versus a tiny effusion. The heart, hila, mediastinum, lungs, and pleura are otherwise unremarkable. Degenerative changes in the left shoulder. IMPRESSION: 3 cm masslike region in the right mid lung could represent a neoplasm versus a rounded infectious/inflammatory process. Recommend a CT scan for further evaluation. Electronically Signed   By: Dorise Bullion III M.D   On: 09/19/2018 10:56    Medications:   . aspirin EC  81 mg Oral Daily  . docusate sodium  100 mg Oral QPM  . heparin  5,000 Units Subcutaneous Q8H  . pravastatin  20 mg  Oral QPM  . vitamin B-12  1,000 mcg Oral Daily   Continuous Infusions:   LOS: 0 days   Geradine Girt  [facility]   *Please refer to [email or web address], password [email or web address] to get updated schedule on who will round on this patient, as hospitalists switch teams weekly. If 7PM-7AM, please contact night-coverage at [email or web address], password [email or web address] for any overnight needs.  09/20/2018, 2:05 PM

## 2018-09-20 NOTE — Progress Notes (Signed)
Lunch tray ordered for patient per new diet order. Patient resting comfortably in bed, has no complaints. Daughter at bedside.

## 2018-09-20 NOTE — H&P (View-Only) (Signed)
Cardiology Consultation:   Patient ID: Leslie Carr; [id number]; October 04, 1931   Admit date: 09/19/2018 Date of Consult: 09/20/2018  Primary Care Provider: Corine Shelter, PA-C Primary Cardiologist: Dr. Tamala Carr but has not been seen since 2017 Primary Electrophysiologist:  None   Patient Profile:   Leslie Carr is a 84 y.o. female with a CAD (noted to have coronary calcifications in LAD/RCA on CT 2017), chronic combined CHF (last echo 2017 with EF 48% and G1DD), and paroxysmal atrial fibrillation not on anticoagulation, AAA, prior CVA, breast cancer with recurrent pleural effusions s/p L pleural decortication 2017, who is being seen today for the evaluation of chest pain at the request of Leslie Carr.  History of Present Illness:   Leslie Carr reports significant functional decline since her husbands death in 07/15/23. She states she has been feeling DOE with walking or climbing a flight of stairs but attributes this to inactivity and deconditioning. In the past 2 weeks, she has had two episodes of chest pain which she describes as a pressure sensation that awakens her from sleep. The first occurrence was associated with a nose bleed. The second occurrence (09/19/18) was associated with nausea but no vomiting. She denies associated diaphoresis, SOB, dizziness, lightheadedness, or syncope with either episode. Both resolved spontaneously within 30 minutes. Given repeat occurrence on 09/19/18, she decided to present to the ED for further evaluation.   She was last seen outpatient by cardiology, Leslie Dopp, PA-C, 04/2016 in follow up of recent hospitalization. She had multiple complaints regarding medications including hypotension, weakness, and nausea; at that point and was taken off amiodarone, BBlocker, and ACEi. She was recommended for stress testing for risk stratification at some point in the future but was without anginal complaints. She was lost to follow-up at that time. She has never undergone an  ischemic evaluation.   At the time of this evaluation she is chest pain free. Only complaint is hunger at this time. She attributes her symptoms to having a broken heart. She is tearful when talking about her late husband of 49 years. Prior to his death she was fairly active and did not experience anginal complaints. She acknowledges her prior cardiac history listed about but states she does not tolerate medications so has not taken any medications for CHF or history of atrial fibrillation. She denies recent illness, orthopnea, PND, LE edema.   Hospital course: BP elevated with SBP in the 160s yesterday afternoon; otherwise VSS. Labs notable for electrolytes wnl, Cr 1.43>1.17 (baseline ~1), CBC wnl, LDL 90, Trop negative x3, HgbA1C 5.7, Ddimer 1.68. EKG with sinus rhythm with non-specific T wave abnormalities; no STE/D. CXR with 3cm mass in RML (neoplasm vs infection vs inflammation); recommended for CT scan to further evaluate. LE doppler without DVT. Patient was admitted to medicine. Cardiology asked to evaluate for chest pain.  Past Medical History:  Diagnosis Date  . 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 Breast cancer ([place])   . CHF (congestive heart failure) ([place])   . Heart murmur   . History of bronchitis   . Hypercholesteremia   . Numbness and tingling in left hand   . Pleural effusion on left   . Shortness of breath dyspnea    "only going up a flight of stairs"  . Stroke Leslie Carr) 2014   left sided weakness - mainly left hand    Past Surgical History:  Procedure Laterality Date  . ABDOMINAL HYSTERECTOMY  1976  . CATARACT  EXTRACTION Right   . COLONOSCOPY    . DECORTICATION Left 04/17/2016   Procedure: Visceral Pleural DECORTICATION;  Surgeon: Melrose Nakayama, MD;  Location: [place];  Service: Thoracic;  Laterality: Left;  . FOOT SURGERY Right    "bone spur removal"  . MASTECTOMY Left 1985  . PLEURAL BIOPSY Left 04/17/2016   Procedure:  Left PLEURAL BIOPSY;   Surgeon: Melrose Nakayama, MD;  Location: [place];  Service: Thoracic;  Laterality: Left;  . PLEURAL EFFUSION DRAINAGE Left 04/17/2016   Procedure: DRAINAGE OF Left PLEURAL EFFUSION;  Surgeon: Melrose Nakayama, MD;  Location: [place];  Service: Thoracic;  Laterality: Left;  . TONSILLECTOMY    . VIDEO ASSISTED THORACOSCOPY Left 04/17/2016   Procedure:  Left VIDEO ASSISTED THORACOSCOPY with Drainage of Pleural Effusion, Pleural Biopsy, Visceral Pleural Decortication and Placement of On-Q pain pump;  Surgeon: Melrose Nakayama, MD;  Location: [place];  Service: Thoracic;  Laterality: Left;     Home Medications:  Prior to Admission medications   Medication Sig Start Date End Date Taking? Authorizing Provider  aspirin EC 81 MG tablet Take 81 mg by mouth daily.   Yes [provider]  docusate sodium (COLACE) 100 MG capsule Take 100 mg by mouth every evening.   Yes [provider]  Multiple Vitamins-Minerals (EMERGEN-C IMMUNE PLUS PO) Take 1 packet by mouth daily.   Yes [provider]  pravastatin (PRAVACHOL) 20 MG tablet Take 20 mg by mouth every evening.    Yes [provider]  Propylene Glycol (SYSTANE COMPLETE OP) Apply to eye.   Yes [provider]  vitamin B-12 (CYANOCOBALAMIN) 1000 MCG tablet Take 1,000 mcg by mouth daily.   Yes [provider]    Inpatient Medications: Scheduled Meds: . aspirin EC  81 mg Oral Daily  . docusate sodium  100 mg Oral QPM  . heparin  5,000 Units Subcutaneous Q8H  . pravastatin  20 mg Oral QPM  . vitamin B-12  1,000 mcg Oral Daily   Continuous Infusions:  PRN Meds: nitroGLYCERIN  Allergies:    Allergies  Allergen Reactions  . Lipitor [Atorvastatin] Other (See Comments)    Dizzy and make her head feel huge     Social History:   Social History   Socioeconomic History  . Marital status: Divorced    Spouse name: Not on file  . Number of children: Not on file  . Years of education: Not on  file  . Highest education level: Not on file  Occupational History  . Not on file  Social Needs  . Financial resource strain: Not hard at all  . Food insecurity:    Worry: Never true    Inability: Never true  . Transportation needs:    Medical: No    Non-medical: No  Tobacco Use  . Smoking status: Former Research scientist (life sciences)  . Smokeless tobacco: Never Used  . Tobacco comment: Quit over 40 years ago  Substance and Sexual Activity  . Alcohol use: No    Alcohol/week: 0.0 standard drinks  . Drug use: No  . Sexual activity: Not on file  Lifestyle  . Physical activity:    Days per week: 7 days    Minutes per session: 30 min  . Stress: Only a little  Relationships  . Social connections:    Talks on phone: More than three times a week    Gets together: More than three times a week    Attends religious service: 1 to  4 times per year    Active member of club or organization: No    Attends meetings of clubs or organizations: Never    Relationship status: Divorced  . Intimate partner violence:    Fear of current or ex partner: No    Emotionally abused: No    Physically abused: No    Forced sexual activity: No  Other Topics Concern  . Not on file  Social History Narrative  . Not on file    Family History:    Family History  Problem Relation Age of Onset  . Heart disease Neg Hx   . Heart attack Neg Hx   . Hypertension Neg Hx   . Hyperlipidemia Neg Hx   . Heart failure Neg Hx      ROS:  Please see the history of present illness.   All other ROS reviewed and negative.     Physical Exam/Data:   Vitals:   09/19/18 1400 09/19/18 1630 09/19/18 1800 09/20/18 0500  BP: (!) 150/69 (!) 166/75 117/78   Pulse: 73 64 90 66  Resp: 14 14 16 19   Temp:   98.7 F (37.1 C) 98 F (36.7 C)  TempSrc:   Oral Oral  SpO2: 97% 96% 96% 98%  Weight:      Height:       No intake or output data in the 24 hours ending 09/20/18 0929 Filed Weights   09/19/18 0917  Weight: 63 kg   Body mass index  is 25.42 kg/m.  General:  Well nourished, well developed, laying in bed in no acute distress. Appears younger than stated age 89: sclera anicteric  Neck: no JVD Vascular: No carotid bruits; distal pulses 2+ bilaterally Cardiac:  normal S1, S2; RRR; +murmur, no gallops or rubs Lungs:  clear to auscultation bilaterally, no wheezing, rhonchi or rales  Abd: NABS, soft, nontender, no hepatomegaly Ext: no edema Musculoskeletal:  No deformities, BUE and BLE strength normal and equal Skin: warm and dry  Neuro:  CNs 2-12 intact, no focal abnormalities noted Psych:  Normal affect   EKG:  The EKG was personally reviewed and demonstrates:  sinus rhythm with non-specific T wave abnormalities; no STE/D. Telemetry:  Telemetry was personally reviewed and demonstrates:  Sinus rhythm with frequent artifact. No evidence of arrhythmias  Relevant CV Studies:  Echocardiogram 2017: Study Conclusions  - Procedure narrative: Transthoracic echocardiography. Technically   difficult study. - Left ventricle: The cavity size was normal. There was moderate   concentric hypertrophy. Systolic function was normal. The   estimated ejection fraction was 48%. Diffuse hypokinesis. Doppler   parameters are consistent with abnormal left ventricular   relaxation (grade 1 diastolic dysfunction). The E/e&' ratio is   >15, suggesting elevated LV filling pressure. - Aortic valve: Poorly visualized. Transvalvular velocity was   minimally increased. - Left atrium: The atrium was normal in size. - Pericardium, extracardiac: There was a right pleural effusion.   There was a left pleural effusion.  Impressions:  - Technically difficult study. LVEF 48%, diffuse hypokinesis,   moderate LVH, disatolic dysfunction, elevated LV filling   pressure, bilateral pleural effusions, normal LA size, mildly   increased aortic valve velocity.  Laboratory Data:  Chemistry Recent Labs  Lab 09/19/18 0926 09/20/18 0705  NA 140  141  K 4.4 4.1  CL 107 111  CO2 25 25  GLUCOSE 101* 92  BUN 20 15  CREATININE 1.43* 1.17*  CALCIUM 8.8* 8.3*  GFRNONAA 33* 42*  GFRAA 38* 48*  ANIONGAP 8 5    No results for input(s): PROT, ALBUMIN, AST, ALT, ALKPHOS, BILITOT in the last 168 hours. Hematology Recent Labs  Lab 09/19/18 0926 09/20/18 0705  WBC 6.1 4.3  RBC 4.51 4.23  HGB 13.4 12.8  HCT 42.6 39.8  MCV 94.5 94.1  MCH 29.7 30.3  MCHC 31.5 32.2  RDW 13.0 13.2  PLT 259 218   Cardiac Enzymes Recent Labs  Lab 09/19/18 1835  TROPONINI <0.03    Recent Labs  Lab 09/19/18 0930 09/19/18 1445  TROPIPOC 0.00 0.01    BNPNo results for input(s): BNP, PROBNP in the last 168 hours.  DDimer  Recent Labs  Lab 09/19/18 1126  DDIMER 1.68*    Radiology/Studies:  Dg Chest 2 View  Result Date: 09/19/2018 CLINICAL DATA:  Shortness of breath.  History of breast cancer. EXAM: CHEST - 2 VIEW COMPARISON:  None. FINDINGS: There is a 3 cm masslike region seen just inferior to the minor fissure on the right frontal view. No other definitive nodules or masses. Blunting of the left costophrenic angle could represent pleural thickening versus a tiny effusion. The heart, hila, mediastinum, lungs, and pleura are otherwise unremarkable. Degenerative changes in the left shoulder. IMPRESSION: 3 cm masslike region in the right mid lung could represent a neoplasm versus a rounded infectious/inflammatory process. Recommend a CT scan for further evaluation. Electronically Signed   By: Dorise Bullion III M.D   On: 09/19/2018 10:56    Assessment and Plan:   1. Chest pain in patient with CAD noted on prior CT chest: patient reports 2 episodes of chest pressure which woke her from sleep in the past 2 weeks, most recently the morning of 09/19/18. No prior ischemic evaluation in the past. Here troponins have been negative, EKG non-ischemic.  - Will plan for NST to further evaluate chest pain - Will check an echocardiogram given history of mild  systolic dysfunction and increased stress with loss of her husband.  - If NST and echo are normal, do not anticipate further cardiac work-up.  - Continue aspirin and statin for risk factor modifications.   2. DOE in patient with history of chronic combined CHF: last echo 2017 with EF 48% and G1DD. She was intolerant to BBlocker and ACEi at that time due to weakness and hypotension. She has no volume overload complaints and appears euvolemic on exam.  - Will check an echocardiogram to evaluate LV function and murmur on exam  3. Paroxysmal atrial fibrillation: occurred post-operatively in 2017. Was on amiodarone and xarelto for a brief stent but both stopped due to intolerance. No evidence of recurrence on EKGs or telemetry. No complaints of palpitations.  - Will hold off on anticoagulation at this time. Low threshold for starting apixaban if reoccurs given elevated stroke risk.   4. AAA: follows outpatient with Vascular surgery - Continue routine outpatient monitoring  5. Murmur: suspect AS. Aortic valve poorly visualized on last echo in 2017 - Will check an echocardiogram this admission  6. Abnormal CXR and elevated Ddimer: nodule noted in RML on CXR, recommended for CT Chest to further characterize. Ddimer also elevated. LE duplex negative for DVT.  - Will defer additional work-up to primary team.    For questions or updates, please contact [place] Please consult [email or web address] for contact info under Cardiology/STEMI.   Signed, Abigail Butts, PA-C  09/20/2018 9:29 AM [phone]  Patient examined chart reviewed Discussed care with daughters patient and PA. Atypica pain with normal ECG. She does  not walk up hill well. She has a mild/moderate AS murmur on exam with clear lungs soft abdomen and looks younger than stated age. Husband of 49 years died in 07/23/23 and she has been working through this. Plan is for lexiscan myouve and TTE to further assess chest pain and murmur  Jenkins Rouge

## 2018-09-20 NOTE — Progress Notes (Signed)
ANTICOAGULATION CONSULT NOTE - Initial Consult  Pharmacy Consult for Heparin Indication: Abnormal stress test  Allergies  Allergen Reactions  . Lipitor [Atorvastatin] Other (See Comments)    Dizzy and make her head feel huge     Patient Measurements: Height: 5\' 2"  (157.5 cm) Weight: 139 lb (63 kg) IBW/kg (Calculated) : 50.1 Heparin Dosing Weight: 63 kg  Vital Signs: Temp: 98.5 F (36.9 C) (09/30 1100) Temp Source: Oral (09/30 1100) BP: 171/74 (09/30 1237) Pulse Rate: 104 (09/30 1237)  Labs: Recent Labs    09/19/18 0926 09/19/18 1835 09/20/18 0705  HGB 13.4  --  12.8  HCT 42.6  --  39.8  PLT 259  --  218  CREATININE 1.43*  --  1.17*  TROPONINI  --  <0.03  --     Estimated Creatinine Clearance: 31.2 mL/min (A) (by C-G formula based on SCr of 1.17 mg/dL (H)).   Medical History: Past Medical History:  Diagnosis Date  . Breast cancer ([place])   . CHF (congestive heart failure) ([place])   . Heart murmur   . History of bronchitis   . Hypercholesteremia   . Numbness and tingling in left hand   . Pleural effusion on left   . Shortness of breath dyspnea    "only going up a flight of stairs"  . Stroke [facility]) 2014   left sided weakness - mainly left hand    Assessment: CC/HPI: 84 yo female with chest pain. Elevated Ddimer  PMH: on Xarelto in past for PAF and was stopped 7/17. breast CA s/p mastectomy, CHF, HLD, CVA, PAF, L pleural effusion, lung nodule (benign), thoracic AA f/b cardiovascular surgery   Anticoag: h/o afib and CVA. Start IV heparin for abnormal nuclear stress test. CBC WNL.  CV: BP elevated. HR 104. EF 63% on stress test. - 9/30 Stress test: Large size, severe intensity reversible apical and anterolateral perfusion defect suggestive of ischemia   Goal of Therapy:  Heparin level 0.3-0.7 units/ml Monitor platelets by anticoagulation protocol: Yes   Plan:  D/c SQ heparin (last dose 1400) Cath tomorrow Start IV heparin with half-bolus at 2000 units  and infusion at 900 units/hr Check heparin level in 8 hrs Daily HL and CBC   Daleyza Gadomski S. Alford Highland, PharmD, BCPS Clinical Staff Pharmacist  [place], [place] 09/20/2018,4:03 PM

## 2018-09-20 NOTE — Care Management Obs Status (Signed)
[place] NOTIFICATION   Patient Details  Name: Leslie Carr MRN: [id number] Date of Birth: 1931/05/13   Medicare Observation Status Notification Given:  Yes    Midge Minium RN, BSN, NCM-BC, ACM-RN [phone] 09/20/2018, 4:24 PM

## 2018-09-21 ENCOUNTER — Encounter (HOSPITAL_COMMUNITY): Payer: Self-pay | Admitting: Cardiology

## 2018-09-21 ENCOUNTER — Observation Stay (HOSPITAL_BASED_OUTPATIENT_CLINIC_OR_DEPARTMENT_OTHER): Payer: Medicare Other

## 2018-09-21 ENCOUNTER — Encounter (HOSPITAL_COMMUNITY)
Admission: EM | Disposition: A | Discharge status: Home / Self Care | Payer: Self-pay | Source: Home / Self Care | Attending: Internal Medicine

## 2018-09-21 DIAGNOSIS — R1310 Dysphagia, unspecified: Secondary | ICD-10-CM | POA: Diagnosis present

## 2018-09-21 DIAGNOSIS — R0789 Other chest pain: Secondary | ICD-10-CM | POA: Diagnosis not present

## 2018-09-21 DIAGNOSIS — N179 Acute kidney failure, unspecified: Secondary | ICD-10-CM | POA: Diagnosis present

## 2018-09-21 DIAGNOSIS — I480 Paroxysmal atrial fibrillation: Secondary | ICD-10-CM

## 2018-09-21 DIAGNOSIS — I25119 Atherosclerotic heart disease of native coronary artery with unspecified angina pectoris: Secondary | ICD-10-CM | POA: Diagnosis not present

## 2018-09-21 DIAGNOSIS — I2510 Atherosclerotic heart disease of native coronary artery without angina pectoris: Secondary | ICD-10-CM | POA: Diagnosis not present

## 2018-09-21 DIAGNOSIS — I25110 Atherosclerotic heart disease of native coronary artery with unstable angina pectoris: Secondary | ICD-10-CM | POA: Diagnosis present

## 2018-09-21 DIAGNOSIS — I712 Thoracic aortic aneurysm, without rupture: Secondary | ICD-10-CM | POA: Diagnosis present

## 2018-09-21 DIAGNOSIS — Z888 Allergy status to other drugs, medicaments and biological substances status: Secondary | ICD-10-CM | POA: Diagnosis not present

## 2018-09-21 DIAGNOSIS — I700 Atherosclerosis of aorta: Secondary | ICD-10-CM | POA: Diagnosis present

## 2018-09-21 DIAGNOSIS — I350 Nonrheumatic aortic (valve) stenosis: Secondary | ICD-10-CM

## 2018-09-21 DIAGNOSIS — I351 Nonrheumatic aortic (valve) insufficiency: Secondary | ICD-10-CM | POA: Diagnosis not present

## 2018-09-21 DIAGNOSIS — N183 Chronic kidney disease, stage 3 (moderate): Secondary | ICD-10-CM | POA: Diagnosis present

## 2018-09-21 DIAGNOSIS — I214 Non-ST elevation (NSTEMI) myocardial infarction: Secondary | ICD-10-CM | POA: Insufficient documentation

## 2018-09-21 DIAGNOSIS — Z9012 Acquired absence of left breast and nipple: Secondary | ICD-10-CM | POA: Diagnosis not present

## 2018-09-21 DIAGNOSIS — I2584 Coronary atherosclerosis due to calcified coronary lesion: Secondary | ICD-10-CM | POA: Diagnosis present

## 2018-09-21 DIAGNOSIS — Z853 Personal history of malignant neoplasm of breast: Secondary | ICD-10-CM | POA: Diagnosis not present

## 2018-09-21 DIAGNOSIS — I708 Atherosclerosis of other arteries: Secondary | ICD-10-CM | POA: Diagnosis present

## 2018-09-21 DIAGNOSIS — R030 Elevated blood-pressure reading, without diagnosis of hypertension: Secondary | ICD-10-CM | POA: Diagnosis present

## 2018-09-21 DIAGNOSIS — R911 Solitary pulmonary nodule: Secondary | ICD-10-CM | POA: Diagnosis present

## 2018-09-21 DIAGNOSIS — I69334 Monoplegia of upper limb following cerebral infarction affecting left non-dominant side: Secondary | ICD-10-CM | POA: Diagnosis not present

## 2018-09-21 DIAGNOSIS — F419 Anxiety disorder, unspecified: Secondary | ICD-10-CM | POA: Diagnosis present

## 2018-09-21 DIAGNOSIS — I5042 Chronic combined systolic (congestive) and diastolic (congestive) heart failure: Secondary | ICD-10-CM | POA: Diagnosis present

## 2018-09-21 DIAGNOSIS — R9439 Abnormal result of other cardiovascular function study: Secondary | ICD-10-CM

## 2018-09-21 DIAGNOSIS — E785 Hyperlipidemia, unspecified: Secondary | ICD-10-CM | POA: Diagnosis present

## 2018-09-21 DIAGNOSIS — R011 Cardiac murmur, unspecified: Secondary | ICD-10-CM | POA: Diagnosis present

## 2018-09-21 DIAGNOSIS — Z87891 Personal history of nicotine dependence: Secondary | ICD-10-CM | POA: Diagnosis not present

## 2018-09-21 DIAGNOSIS — I200 Unstable angina: Secondary | ICD-10-CM | POA: Diagnosis not present

## 2018-09-21 DIAGNOSIS — Z7982 Long term (current) use of aspirin: Secondary | ICD-10-CM | POA: Diagnosis not present

## 2018-09-21 DIAGNOSIS — I959 Hypotension, unspecified: Secondary | ICD-10-CM | POA: Diagnosis present

## 2018-09-21 DIAGNOSIS — Z79899 Other long term (current) drug therapy: Secondary | ICD-10-CM | POA: Diagnosis not present

## 2018-09-21 DIAGNOSIS — I359 Nonrheumatic aortic valve disorder, unspecified: Secondary | ICD-10-CM | POA: Diagnosis not present

## 2018-09-21 HISTORY — DX: Atherosclerotic heart disease of native coronary artery without angina pectoris: I25.10

## 2018-09-21 HISTORY — PX: LEFT HEART CATH AND CORONARY ANGIOGRAPHY: CATH118249

## 2018-09-21 HISTORY — PX: CORONARY STENT INTERVENTION: CATH118234

## 2018-09-21 HISTORY — PX: TRANSTHORACIC ECHOCARDIOGRAM: SHX275

## 2018-09-21 HISTORY — DX: Nonrheumatic aortic (valve) stenosis: I35.0

## 2018-09-21 LAB — HEPARIN LEVEL (UNFRACTIONATED)
HEPARIN UNFRACTIONATED: 0.88 [IU]/mL — AB (ref 0.30–0.70)
HEPARIN UNFRACTIONATED: 1.1 [IU]/mL — AB (ref 0.30–0.70)

## 2018-09-21 LAB — BASIC METABOLIC PANEL
Anion gap: 9 (ref 5–15)
BUN: 14 mg/dL (ref 8–23)
CO2: 20 mmol/L — ABNORMAL LOW (ref 22–32)
Calcium: 8.4 mg/dL — ABNORMAL LOW (ref 8.9–10.3)
Chloride: 111 mmol/L (ref 98–111)
Creatinine, Ser: 1.19 mg/dL — ABNORMAL HIGH (ref 0.44–1.00)
GFR, EST AFRICAN AMERICAN: 47 mL/min — AB (ref >60)
GFR, EST NON AFRICAN AMERICAN: 41 mL/min — AB (ref >60)
Glucose, Bld: 88 mg/dL (ref 70–99)
POTASSIUM: 4.1 mmol/L (ref 3.5–5.1)
Sodium: 140 mmol/L (ref 135–145)

## 2018-09-21 LAB — ECHOCARDIOGRAM COMPLETE
Height: 62 in
Weight: 2169.3264 oz

## 2018-09-21 LAB — CBC
HCT: 38.5 % (ref 36.0–46.0)
HEMOGLOBIN: 12.4 g/dL (ref 12.0–15.0)
MCH: 30 pg (ref 26.0–34.0)
MCHC: 32.2 g/dL (ref 30.0–36.0)
MCV: 93.2 fL (ref 78.0–100.0)
Platelets: 227 10*3/uL (ref 150–400)
RBC: 4.13 MIL/uL (ref 3.87–5.11)
RDW: 13.1 % (ref 11.5–15.5)
WBC: 4.8 10*3/uL (ref 4.0–10.5)

## 2018-09-21 LAB — POCT ACTIVATED CLOTTING TIME
Activated Clotting Time: 142 seconds
Activated Clotting Time: 230 seconds
Activated Clotting Time: 318 seconds

## 2018-09-21 SURGERY — LEFT HEART CATH AND CORONARY ANGIOGRAPHY
Anesthesia: LOCAL

## 2018-09-21 MED ORDER — MORPHINE SULFATE (PF) 2 MG/ML IV SOLN: 1.0000 mg | INTRAVENOUS | Status: DC | PRN

## 2018-09-21 MED ORDER — CLOPIDOGREL BISULFATE 75 MG PO TABS
75.0000 mg | ORAL_TABLET | Freq: Every day | ORAL | Status: DC
  Administered 2018-09-22 – 2018-09-25 (×4): 75 mg via ORAL
  Filled 2018-09-21 (×4): qty 1

## 2018-09-21 MED ORDER — HEPARIN SODIUM (PORCINE) 1000 UNIT/ML IJ SOLN
INTRAMUSCULAR | Status: DC | PRN
  Administered 2018-09-21: 5000 [IU] via INTRAVENOUS
  Administered 2018-09-21: 2000 [IU] via INTRAVENOUS

## 2018-09-21 MED ORDER — MIDAZOLAM HCL 2 MG/2ML IJ SOLN
INTRAMUSCULAR | Status: AC
  Filled 2018-09-21: qty 2

## 2018-09-21 MED ORDER — HYDRALAZINE HCL 20 MG/ML IJ SOLN: 5.0000 mg | INTRAMUSCULAR | Status: AC | PRN

## 2018-09-21 MED ORDER — FENTANYL CITRATE (PF) 100 MCG/2ML IJ SOLN
INTRAMUSCULAR | Status: DC | PRN
  Administered 2018-09-21 (×4): 25 ug via INTRAVENOUS
  Administered 2018-09-21: 50 ug via INTRAVENOUS

## 2018-09-21 MED ORDER — VERAPAMIL HCL 2.5 MG/ML IV SOLN
INTRAVENOUS | Status: DC | PRN
  Administered 2018-09-21: 10 mL via INTRA_ARTERIAL

## 2018-09-21 MED ORDER — SODIUM CHLORIDE 0.9 % WEIGHT BASED INFUSION
3.0000 mL/kg/h | INTRAVENOUS | Status: DC
  Administered 2018-09-21: 3 mL/kg/h via INTRAVENOUS

## 2018-09-21 MED ORDER — SODIUM CHLORIDE 0.9% FLUSH: 3.0000 mL | Freq: Two times a day (BID) | INTRAVENOUS | Status: DC

## 2018-09-21 MED ORDER — SODIUM CHLORIDE 0.9% FLUSH: 3.0000 mL | INTRAVENOUS | Status: DC | PRN

## 2018-09-21 MED ORDER — HEPARIN (PORCINE) IN NACL 1000-0.9 UT/500ML-% IV SOLN
INTRAVENOUS | Status: AC
  Filled 2018-09-21: qty 1000

## 2018-09-21 MED ORDER — HEPARIN SODIUM (PORCINE) 5000 UNIT/ML IJ SOLN
5000.0000 [IU] | Freq: Three times a day (TID) | INTRAMUSCULAR | Status: DC
  Administered 2018-09-22 – 2018-09-25 (×6): 5000 [IU] via SUBCUTANEOUS
  Filled 2018-09-21 (×7): qty 1

## 2018-09-21 MED ORDER — IOHEXOL 350 MG/ML SOLN
INTRAVENOUS | Status: DC | PRN
  Administered 2018-09-21: 190 mL via INTRA_ARTERIAL

## 2018-09-21 MED ORDER — SODIUM CHLORIDE 0.9 % IV SOLN: INTRAVENOUS | Status: AC

## 2018-09-21 MED ORDER — MIDAZOLAM HCL 2 MG/2ML IJ SOLN
INTRAMUSCULAR | Status: DC | PRN
  Administered 2018-09-21 (×3): 1 mg via INTRAVENOUS

## 2018-09-21 MED ORDER — SODIUM CHLORIDE 0.9 % WEIGHT BASED INFUSION
1.0000 mL/kg/h | INTRAVENOUS | Status: DC
  Administered 2018-09-21: 1 mL/kg/h via INTRAVENOUS

## 2018-09-21 MED ORDER — SODIUM CHLORIDE 0.9 % IV SOLN: 250.0000 mL | INTRAVENOUS | Status: DC | PRN

## 2018-09-21 MED ORDER — ASPIRIN 81 MG PO CHEW
81.0000 mg | CHEWABLE_TABLET | ORAL | Status: AC
  Administered 2018-09-21: 81 mg via ORAL
  Filled 2018-09-21: qty 1

## 2018-09-21 MED ORDER — NITROGLYCERIN 1 MG/10 ML FOR IR/CATH LAB
INTRA_ARTERIAL | Status: AC
  Filled 2018-09-21: qty 10

## 2018-09-21 MED ORDER — HEPARIN (PORCINE) IN NACL 1000-0.9 UT/500ML-% IV SOLN
INTRAVENOUS | Status: DC | PRN
  Administered 2018-09-21 (×2): 500 mL

## 2018-09-21 MED ORDER — HYDRALAZINE HCL 20 MG/ML IJ SOLN
INTRAMUSCULAR | Status: DC | PRN
  Administered 2018-09-21: 10 mg via INTRAVENOUS

## 2018-09-21 MED ORDER — CLOPIDOGREL BISULFATE 300 MG PO TABS
ORAL_TABLET | ORAL | Status: DC | PRN
  Administered 2018-09-21: 600 mg via ORAL

## 2018-09-21 MED ORDER — ONDANSETRON HCL 4 MG/2ML IJ SOLN
INTRAMUSCULAR | Status: DC | PRN
  Administered 2018-09-21: 4 mg via INTRAVENOUS

## 2018-09-21 MED ORDER — ONDANSETRON HCL 4 MG/2ML IJ SOLN: 4.0000 mg | Freq: Four times a day (QID) | INTRAMUSCULAR | Status: DC | PRN

## 2018-09-21 MED ORDER — FENTANYL CITRATE (PF) 100 MCG/2ML IJ SOLN
INTRAMUSCULAR | Status: AC
  Filled 2018-09-21: qty 2

## 2018-09-21 MED ORDER — LIDOCAINE HCL (PF) 1 % IJ SOLN
INTRAMUSCULAR | Status: AC
  Filled 2018-09-21: qty 30

## 2018-09-21 MED ORDER — LIDOCAINE HCL (PF) 1 % IJ SOLN
INTRAMUSCULAR | Status: DC | PRN
  Administered 2018-09-21: 2 mL
  Administered 2018-09-21: 18 mL

## 2018-09-21 MED ORDER — LABETALOL HCL 5 MG/ML IV SOLN: 10.0000 mg | INTRAVENOUS | Status: AC | PRN

## 2018-09-21 MED ORDER — CLOPIDOGREL BISULFATE 300 MG PO TABS
ORAL_TABLET | ORAL | Status: AC
  Filled 2018-09-21: qty 1

## 2018-09-21 MED ORDER — ONDANSETRON HCL 4 MG/2ML IJ SOLN
INTRAMUSCULAR | Status: AC
  Filled 2018-09-21: qty 2

## 2018-09-21 MED ORDER — VERAPAMIL HCL 2.5 MG/ML IV SOLN
INTRAVENOUS | Status: AC
  Filled 2018-09-21: qty 2

## 2018-09-21 MED ORDER — SODIUM CHLORIDE 0.9% FLUSH
3.0000 mL | Freq: Two times a day (BID) | INTRAVENOUS | Status: DC
  Administered 2018-09-22 – 2018-09-24 (×4): 3 mL via INTRAVENOUS

## 2018-09-21 MED ORDER — NITROGLYCERIN 1 MG/10 ML FOR IR/CATH LAB
INTRA_ARTERIAL | Status: DC | PRN
  Administered 2018-09-21: 200 ug via INTRACORONARY

## 2018-09-21 MED ORDER — HYDRALAZINE HCL 20 MG/ML IJ SOLN
INTRAMUSCULAR | Status: AC
  Filled 2018-09-21: qty 1

## 2018-09-21 SURGICAL SUPPLY — 23 items
BALLN SAPPHIRE 2.5X12 (BALLOONS) ×2
BALLN SAPPHIRE ~~LOC~~ 2.75X12 (BALLOONS) ×1 IMPLANT
BALLOON SAPPHIRE 2.5X12 (BALLOONS) IMPLANT
CABLE ADAPT CONN TEMP 6FT (ADAPTER) ×1 IMPLANT
CATH 5FR JL3.5 JR4 ANG PIG MP (CATHETERS) ×1 IMPLANT
CATH S G BIP PACING (SET/KITS/TRAYS/PACK) ×1 IMPLANT
CATH VISTA GUIDE 6FR XB3.5 (CATHETERS) ×1 IMPLANT
DEVICE RAD COMP TR BAND LRG (VASCULAR PRODUCTS) ×1 IMPLANT
GLIDESHEATH SLEND A-KIT 6F 22G (SHEATH) ×1 IMPLANT
GUIDEWIRE INQWIRE 1.5J.035X260 (WIRE) IMPLANT
INQWIRE 1.5J .035X260CM (WIRE) ×2
KIT ENCORE 26 ADVANTAGE (KITS) ×1 IMPLANT
KIT HEART LEFT (KITS) ×2 IMPLANT
PACK CARDIAC CATHETERIZATION (CUSTOM PROCEDURE TRAY) ×2 IMPLANT
SHEATH PINNACLE 5F 10CM (SHEATH) ×2 IMPLANT
SHEATH PINNACLE 6F 10CM (SHEATH) ×2 IMPLANT
SHEATH PROBE COVER 6X72 (BAG) ×1 IMPLANT
STENT SIERRA 2.50 X 18 MM (Permanent Stent) ×1 IMPLANT
TRANSDUCER W/STOPCOCK (MISCELLANEOUS) ×2 IMPLANT
TUBING CIL FLEX 10 FLL-RA (TUBING) ×2 IMPLANT
WIRE ASAHI PROWATER 180CM (WIRE) ×1 IMPLANT
WIRE EMERALD 3MM-J .035X150CM (WIRE) ×1 IMPLANT
WIRE HI TORQ VERSACORE-J 145CM (WIRE) ×1 IMPLANT

## 2018-09-21 NOTE — CV Procedure (Signed)
Site area: right femoral artery and vein Site Prior to Removal:  level 0 Pressure Applied For: artery: 34mins, vein: 39mins Manual: yes Patient Status During Pull:  stable Post Pull Site:  level 0 Post Pull Instructions Given: yes Post Pull Pulses Present: DP 2+ Dressing Applied: gauze and tegaderm Bedrest begins @ 1450 Comments: Bedrest for 4 hours. Sheaths pulled by Garen Lah, RCIS.

## 2018-09-21 NOTE — Progress Notes (Signed)
[place] for Heparin Indication: Abnormal stress test  Allergies  Allergen Reactions  . Lipitor [Atorvastatin] Other (See Comments)    Dizzy and make her head feel huge     Patient Measurements: Height: 5\' 2"  (157.5 cm) Weight: 135 lb 9.3 oz (61.5 kg) IBW/kg (Calculated) : 50.1 Heparin Dosing Weight: 63 kg  Vital Signs: Temp: 98.6 F (37 C) (09/30 2300) BP: 158/67 (10/01 0604) Pulse Rate: 80 (10/01 0604)  Labs: Recent Labs    09/19/18 0926 09/19/18 1835 09/20/18 0705 09/21/18 0043 09/21/18 0642 09/21/18 0726  HGB 13.4  --  12.8  --  12.4  --   HCT 42.6  --  39.8  --  38.5  --   PLT 259  --  218  --  227  --   HEPARINUNFRC  --   --   --  1.10* 0.88*  --   CREATININE 1.43*  --  1.17*  --   --  1.19*  TROPONINI  --  <0.03  --   --   --   --     Estimated Creatinine Clearance: 30.4 mL/min (A) (by C-G formula based on SCr of 1.19 mg/dL (H)).   Medical History: Past Medical History:  Diagnosis Date  . Breast cancer ([place])   . CHF (congestive heart failure) ([place])   . Heart murmur   . History of bronchitis   . Hypercholesteremia   . Numbness and tingling in left hand   . Pleural effusion on left   . Shortness of breath dyspnea    "only going up a flight of stairs"  . Stroke [facility]) 2014   left sided weakness - mainly left hand    Assessment: 84 yo lady on heparin therapy for CP.  Her heparin level this AM is 0.88 units/ml Goal of Therapy:  Heparin level 0.3-0.7 units/ml Monitor platelets by anticoagulation protocol: Yes   Plan:  Decrease heparin to 650 units/hr F/u after LHC Check heparin level in 6 hrs Daily HL and CBC  Mirjana Tarleton A. Levada Dy, PharmD, [place] Pager: [phone] Please utilize Amion for appropriate phone number to reach the unit pharmacist ([place])    09/21/2018,8:34 AM

## 2018-09-21 NOTE — Interval H&P Note (Signed)
History and Physical Interval Note:  09/21/2018 9:28 AM  Leslie Carr  has presented today for surgery, with the diagnosis of Coronary Artery Calcification with Abnormal Myoview ST to evaluate chest pain with typical & atypical features for Angina.    The various methods of treatment have been discussed with the patient and family. After consideration of risks, benefits and other options for treatment, the patient has consented to  Procedure(s): LEFT HEART CATH AND CORONARY ANGIOGRAPHY (N/A) as a surgical intervention .  The patient's history has been reviewed, patient examined, no change in status, stable for surgery.  I have reviewed the patient's chart and labs.  Questions were answered to the patient's satisfaction.    Cath Lab Visit (complete for each Cath Lab visit)  Clinical Evaluation Leading to the Procedure:   ACS: No.  Non-ACS:    Anginal Classification: CCS III  Anti-ischemic medical therapy: Minimal Therapy (1 class of medications)  Non-Invasive Test Results: High-risk stress test findings: cardiac mortality >3%/year  Prior CABG: No previous CABG  Glenetta Hew

## 2018-09-21 NOTE — Progress Notes (Addendum)
  Echocardiogram 2D Echocardiogram has been performed.  Technically difficult views due to left breast mastectomy.   Ji Feldner L Androw 09/21/2018, 8:21 AM

## 2018-09-21 NOTE — Progress Notes (Signed)
Progress Note    Leslie Carr  WIO:[id number] DOB: February 08, 1931  DOA: 09/19/2018 PCP: Leslie Bal, PA-C    Brief Narrative:     Medical records reviewed and are as summarized below:  Leslie Carr is an 84 y.o. female with medical history significant for breast CA s/p mastectomy, CHF, HLD, CVA, PAF, L pleural effusion, lung nodule (benign), thoracic AA f/b cardiovascular surgery who presented to the ED today with c/o CP and SOB. She states that she has been more short of breath with increased DOE and decreased exercise tolerance for several weeks. She has also been woken up twice in the past couple weeks with heavy /pressurelike chest discomfort, associated with nausea. One of those times was this morning and so she decided to come to the ED for evaluation. The chest discomfort lasts several minutes, is not exacerbated or relieved by anything. It does not radiate. No diaphoresis. She reports that she has had a nonproductive cough for "a while" as well. No f/c/ns. She has had no weight loss. No palpitations, orthopnea, PND. She has chronic LLE edema from surgery that she reports is unchanged. She lost her husband of 56 years in July and endorses a significant amount of stress, anxiety and depression since that time. She reports that she believes some or all of her symptoms may be related to her anxiety/stress. She has no known h/o CAD but was told in the past she had "stage I CHF." No h/o arrhythmias. No h/o COPD. She was a one-two cigarette/day smoker but stopped entirely 40 years ago.   Assessment/Plan:   Principal Problem:   Unstable angina (HCC) Active Problems:   Lung nodule, solitary   Status post CVA   PAF (paroxysmal atrial fibrillation) (HCC)   Aortic valve disease   High blood pressure   Ascending aortic aneurysm (HCC)   Chronic combined systolic (congestive) and diastolic (congestive) heart failure (HCC)   CAD (coronary artery disease)   AKI (acute kidney injury)  (HCC)   Abnormal nuclear stress test    Chest pain: she has known CAD from a cardiac CT; in the past she has been tried on a beta blocker and ACE but she could not tolerate these due to weakness/fatigue.  -cardiology consult appreciated -s/p cath:  The left ventricular systolic function is normal. Ejection Fraction is 55-65% by visual estimate.  LV end diastolic pressure is normal.  There is mild aortic valve stenosis.  ______________________________________________________________________  Lesion #1: Ost 2nd Mrg to 2nd Mrg lesion is 85% stenosed.  A drug-eluting stent was successfully placed using a STENT SIERRA 2.50 X 18 MM.  Post intervention, there is a a focal area of 5% residual stenosis.  Dist Cx lesion is 45% stenosed.  ______________________________________________________________________  Lesion segment #2-3: Ost RCA lesion is 70% stenosed. Prox RCA to Mid RCA lesion is 70% stenosed. Mid RCA-1 lesion is 95% stenosed. Mid RCA-2 lesion is 50% stenosed.  Dist RCA lesion is 30% stenosed. Ost RPDA lesion is 30% stenosed.   Severe two-vessel disease: Proximal 2nd OM 85%, extensive calcified RCA disease -near ostial calcified 70%, tubular proximal 70%, mid 95%. Successful PCI of 2nd OM with Xience DES 2.5 x 18 (2.8 mm) Normal LV function  Plan: Due to her brief episode of hypotension and temporary wire requirement, we will transfer her to stepdown unit for overnight monitoring and sheath removal. IV hydration and monitor renal function Tentatively plan for staged orbital atherectomy (CSI)-PCI of RCA on Friday, September 24, 2018. Patient was significant hypertensive during the case, however was normotensive post PCI  Recommend uninterrupted dual antiplatelet therapy with Aspirin 81mg  daily and Clopidogrel 75mg  daily for a minimum of 12 months (ACS - Class I recommendation).  Would be okay to stop aspirin after 6 months if necessary for bleeding.  With extensive PCI of the RCA  pending, may consider continuing Plavix beyond 12 months to 24 months.  Esophageal issues- Stable per patient  Elevated BP  -monitor for now -has not tolerated BB or ACE in the past.   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 Code Status: Full Code.  Family Communication:  Disposition Plan:    Medical Consultants:    cards    Subjective:   S/p cath  Objective:    Vitals:   09/21/18 0403 09/21/18 0604 09/21/18 0943 09/21/18 0959  BP:  (!) 158/67    Pulse:  80    Resp:  18    Temp:      TempSrc:      SpO2:  95% 98% 94%  Weight: 61.5 kg     Height:        Intake/Output Summary (Last 24 hours) at 09/21/2018 1316 Last data filed at 09/21/2018 0817 Gross per 24 hour  Intake 131.46 ml  Output -  Net 131.46 ml   Filed Weights   09/19/18 0917 09/21/18 0403  Weight: 63 kg 61.5 kg    Exam: In bed, anxious appearing rrr No rashes or lesions No increased work of breathing  Data Reviewed:   I have personally reviewed following labs and imaging studies:  Labs: Labs show the following:   Basic Metabolic Panel: Recent Labs  Lab 09/19/18 0926 09/20/18 0705 09/21/18 0726  NA 140 141 140  K 4.4 4.1 4.1  CL 107 111 111  CO2 25 25 20*  GLUCOSE 101* 92 88    BUN 20 15 14   CREATININE 1.43* 1.17* 1.19*  CALCIUM 8.8* 8.3* 8.4*   GFR Estimated Creatinine Clearance: 30.4 mL/min (A) (by C-G formula based on SCr of 1.19 mg/dL (H)). Liver Function Tests: No results for input(s): AST, ALT, ALKPHOS, BILITOT, PROT, ALBUMIN in the last 168 hours. No results for input(s): LIPASE, AMYLASE in the last 168 hours. No results for input(s): AMMONIA in the last 168 hours. Coagulation profile No results for input(s): INR, PROTIME in the last 168 hours.  CBC: Recent Labs  Lab 09/19/18 0926 09/20/18 0705 09/21/18 0642  WBC 6.1 4.3 4.8  HGB 13.4 12.8 12.4  HCT 42.6 39.8 38.5  MCV 94.5 94.1 93.2  PLT 259 218 227   Cardiac Enzymes: Recent Labs  Lab 09/19/18 1835  TROPONINI <0.03   BNP (last 3 results) No results for input(s): PROBNP in the last 8760 hours. CBG: No results for input(s): GLUCAP  in the last 168 hours. D-Dimer: Recent Labs    09/19/18 1126  DDIMER 1.68*   Hgb A1c: Recent Labs    09/20/18 0705  HGBA1C 5.7*   Lipid Profile: Recent Labs    09/20/18 0705  CHOL 143  HDL 36*  LDLCALC 90  TRIG 85  CHOLHDL 4.0   Thyroid function studies: No results for input(s): TSH, T4TOTAL, T3FREE, THYROIDAB in the last 72 hours.  Invalid input(s): FREET3 Anemia work up: No results for input(s): VITAMINB12, FOLATE, FERRITIN, TIBC, IRON, RETICCTPCT in the last 72 hours. Sepsis Labs: Recent Labs  Lab 09/19/18 0926 09/20/18 0705 09/21/18 0642  WBC 6.1 4.3 4.8    Microbiology No results found for this or any previous visit (from the past 240 hour(s)).  Procedures and diagnostic studies:  Nm Myocar Multi W/spect W/wall Motion / Ef  Result Date: 09/20/2018  There was no ST segment deviation noted during stress.  Defect 1: There is a large defect of severe severity.  Findings consistent with ischemia.  This is a high risk study.  Nuclear stress EF: 63%.  No T wave inversion was noted during stress.  Large size, severe  intensity reversible apical and anterolateral perfusion defect suggestive of ischemia. LVEF 63% with normal wall motion. This is a high risk study.    Medications:   . aspirin EC  81 mg Oral Daily  . [START ON 09/22/2018] clopidogrel  75 mg Oral Q breakfast  . docusate sodium  100 mg Oral QPM  . [START ON 09/22/2018] heparin  5,000 Units Subcutaneous Q8H  . pravastatin  20 mg Oral QPM  . sodium chloride flush  3 mL Intravenous Q12H  . vitamin B-12  1,000 mcg Oral Daily   Continuous Infusions: . sodium chloride    . sodium chloride       LOS: 0 days   Geradine Girt  [facility]   *Please refer to [email or web address], password [email or web address] to get updated schedule on who will round on this patient, as hospitalists switch teams weekly. If 7PM-7AM, please contact night-coverage at [email or web address], password [email or web address] for any overnight needs.  09/21/2018, 1:16 PM

## 2018-09-21 NOTE — Progress Notes (Signed)
Progress Note  Patient Name: Leslie Carr Date of Encounter: 09/21/2018  Primary Cardiologist: Sinclair Grooms, MD    Subjective   No chest pain discussed results of TTE and upcoming cath today   Inpatient Medications    Scheduled Meds: . aspirin EC  81 mg Oral Daily  . docusate sodium  100 mg Oral QPM  . pravastatin  20 mg Oral QPM  . sodium chloride flush  3 mL Intravenous Q12H  . vitamin B-12  1,000 mcg Oral Daily   Continuous Infusions: . sodium chloride 250 mL (09/20/18 1631)  . sodium chloride 1 mL/kg/hr (09/21/18 0612)  . heparin 650 Units/hr (09/21/18 0841)   PRN Meds: sodium chloride, nitroGLYCERIN, sodium chloride flush   Vital Signs    Vitals:   09/20/18 1712 09/20/18 2300 09/21/18 0403 09/21/18 0604  BP: 140/75 (!) 184/95  (!) 158/67  Pulse: 96 80  80  Resp: 16 17  18   Temp: 98 F (36.7 C) 98.6 F (37 C)    TempSrc: Oral     SpO2: 98% 98%  95%  Weight:   61.5 kg   Height:        Intake/Output Summary (Last 24 hours) at 09/21/2018 0845 Last data filed at 09/21/2018 0817 Gross per 24 hour  Intake 131.46 ml  Output -  Net 131.46 ml   Filed Weights   09/19/18 0917 09/21/18 0403  Weight: 63 kg 61.5 kg    Telemetry    NSR  - Personally Reviewed  ECG    NSR no acute ST chagnes  - Personally Reviewed  Physical Exam  Elderly white female  GEN: No acute distress.   Neck: No JVD Cardiac: RRR, mild AS murmurs, rubs, or gallops.  Respiratory: Clear to auscultation bilaterally. GI: Soft, nontender, non-distended  MS: No edema; No deformity. Neuro:  Nonfocal  Psych: Normal affect   Labs    Chemistry Recent Labs  Lab 09/19/18 0926 09/20/18 0705 09/21/18 0726  NA 140 141 140  K 4.4 4.1 4.1  CL 107 111 111  CO2 25 25 20*  GLUCOSE 101* 92 88  BUN 20 15 14   CREATININE 1.43* 1.17* 1.19*  CALCIUM 8.8* 8.3* 8.4*  GFRNONAA 33* 42* 41*  GFRAA 38* 48* 47*  ANIONGAP 8 5 9      Hematology Recent Labs  Lab 09/19/18 0926  09/20/18 0705 09/21/18 0642  WBC 6.1 4.3 4.8  RBC 4.51 4.23 4.13  HGB 13.4 12.8 12.4  HCT 42.6 39.8 38.5  MCV 94.5 94.1 93.2  MCH 29.7 30.3 30.0  MCHC 31.5 32.2 32.2  RDW 13.0 13.2 13.1  PLT 259 218 227    Cardiac Enzymes Recent Labs  Lab 09/19/18 1835  TROPONINI <0.03    Recent Labs  Lab 09/19/18 0930 09/19/18 1445  TROPIPOC 0.00 0.01     BNPNo results for input(s): BNP, PROBNP in the last 168 hours.   DDimer  Recent Labs  Lab 09/19/18 1126  DDIMER 1.68*     Radiology    Dg Chest 2 View  Result Date: 09/19/2018 CLINICAL DATA:  Shortness of breath.  History of breast cancer. EXAM: CHEST - 2 VIEW COMPARISON:  None. FINDINGS: There is a 3 cm masslike region seen just inferior to the minor fissure on the right frontal view. No other definitive nodules or masses. Blunting of the left costophrenic angle could represent pleural thickening versus a tiny effusion. The heart, hila, mediastinum, lungs, and pleura are otherwise unremarkable. Degenerative  changes in the left shoulder. IMPRESSION: 3 cm masslike region in the right mid lung could represent a neoplasm versus a rounded infectious/inflammatory process. Recommend a CT scan for further evaluation. Electronically Signed   By: Dorise Bullion III M.D   On: 09/19/2018 10:56   Nm Myocar Multi W/spect Tamela Oddi Motion / Ef  Result Date: 09/20/2018  There was no ST segment deviation noted during stress.  Defect 1: There is a large defect of severe severity.  Findings consistent with ischemia.  This is a high risk study.  Nuclear stress EF: 63%.  No T wave inversion was noted during stress.  Large size, severe intensity reversible apical and anterolateral perfusion defect suggestive of ischemia. LVEF 63% with normal wall motion. This is a high risk study.    Cardiac Studies   TTE:  EF 60-65% mild AS   Patient Profile     84 y.o. female admitted with chest pain myovue high risk with large area of anterolateral  ischemia History of murmur with mild AS on TTE this admission   Assessment & Plan    1. Chest pain:  For cath today discussed risks and procedure willing to proceed  2. AS:  Mild by TTE done this am mean gradient 14 mmHg peak 24 mmHg        For questions or updates, please contact [place] Please consult [email or web address] for contact info under        Signed, Jenkins Rouge, MD  09/21/2018, 8:45 AM

## 2018-09-21 NOTE — Plan of Care (Signed)
Pt alert and oriented x4. Skin warm and dry. Respirations equal and unlabored. Pt ambulatory with one assist. Pt verbalized understanding plan of care. Pt consented to left heart catheterization procedure. Pt verbalized understanding procedure and consent filled. Diagnostic tests improving. Pt progressing towards discharge.

## 2018-09-21 NOTE — Progress Notes (Signed)
Progress Note  Patient Name: Leslie Carr Date of Encounter: 09/21/2018  Primary Cardiologist: Sinclair Grooms, MD   Subjective   Slept well. No complaints this morning - no chest pain or SOB. Anticipating LHC today. Getting echo at the time of evaluation.   Inpatient Medications    Scheduled Meds: . aspirin EC  81 mg Oral Daily  . docusate sodium  100 mg Oral QPM  . pravastatin  20 mg Oral QPM  . sodium chloride flush  3 mL Intravenous Q12H  . vitamin B-12  1,000 mcg Oral Daily   Continuous Infusions: . sodium chloride 250 mL (09/20/18 1631)  . sodium chloride 1 mL/kg/hr (09/21/18 0612)  . heparin 800 Units/hr (09/21/18 0217)   PRN Meds: sodium chloride, nitroGLYCERIN, sodium chloride flush   Vital Signs    Vitals:   09/20/18 1712 09/20/18 2300 09/21/18 0403 09/21/18 0604  BP: 140/75 (!) 184/95  (!) 158/67  Pulse: 96 80  80  Resp: 16 17  18   Temp: 98 F (36.7 C) 98.6 F (37 C)    TempSrc: Oral     SpO2: 98% 98%  95%  Weight:   61.5 kg   Height:        Intake/Output Summary (Last 24 hours) at 09/21/2018 0752 Last data filed at 09/20/2018 2200 Gross per 24 hour  Intake 0 ml  Output -  Net 0 ml   Filed Weights   09/19/18 0917 09/21/18 0403  Weight: 63 kg 61.5 kg    Telemetry    Sinus rhythm, frequent artifact, occasional PVCs, 4beat episode of NSVT - Personally Reviewed  Physical Exam   GEN: Laying in bed in no acute distress.   Neck: No JVD, no carotid bruits Cardiac: RRR, +murmur, no rubs or gallops.  Respiratory: Clear to auscultation bilaterally anteriorly, no wheezes/ rales/ rhonchi GI: NABS, Soft, nontender, non-distended  MS: No edema; No deformity. Neuro:  Nonfocal, moving all extremities spontaneously Psych: Normal affect   Labs    Chemistry Recent Labs  Lab 09/19/18 0926 09/20/18 0705  NA 140 141  K 4.4 4.1  CL 107 111  CO2 25 25  GLUCOSE 101* 92  BUN 20 15  CREATININE 1.43* 1.17*  CALCIUM 8.8* 8.3*  GFRNONAA 33* 42*   GFRAA 38* 48*  ANIONGAP 8 5     Hematology Recent Labs  Lab 09/19/18 0926 09/20/18 0705 09/21/18 0642  WBC 6.1 4.3 4.8  RBC 4.51 4.23 4.13  HGB 13.4 12.8 12.4  HCT 42.6 39.8 38.5  MCV 94.5 94.1 93.2  MCH 29.7 30.3 30.0  MCHC 31.5 32.2 32.2  RDW 13.0 13.2 13.1  PLT 259 218 227    Cardiac Enzymes Recent Labs  Lab 09/19/18 1835  TROPONINI <0.03    Recent Labs  Lab 09/19/18 0930 09/19/18 1445  TROPIPOC 0.00 0.01     BNPNo results for input(s): BNP, PROBNP in the last 168 hours.   DDimer  Recent Labs  Lab 09/19/18 1126  DDIMER 1.68*     Radiology    Dg Chest 2 View  Result Date: 09/19/2018 CLINICAL DATA:  Shortness of breath.  History of breast cancer. EXAM: CHEST - 2 VIEW COMPARISON:  None. FINDINGS: There is a 3 cm masslike region seen just inferior to the minor fissure on the right frontal view. No other definitive nodules or masses. Blunting of the left costophrenic angle could represent pleural thickening versus a tiny effusion. The heart, hila, mediastinum, lungs, and pleura are  otherwise unremarkable. Degenerative changes in the left shoulder. IMPRESSION: 3 cm masslike region in the right mid lung could represent a neoplasm versus a rounded infectious/inflammatory process. Recommend a CT scan for further evaluation. Electronically Signed   By: Dorise Bullion III M.D   On: 09/19/2018 10:56   Nm Myocar Multi W/spect Tamela Oddi Motion / Ef  Result Date: 09/20/2018  There was no ST segment deviation noted during stress.  Defect 1: There is a large defect of severe severity.  Findings consistent with ischemia.  This is a high risk study.  Nuclear stress EF: 63%.  No T wave inversion was noted during stress.  Large size, severe intensity reversible apical and anterolateral perfusion defect suggestive of ischemia. LVEF 63% with normal wall motion. This is a high risk study.    Cardiac Studies   Nuclear stress test 09/20/18:  There was no ST segment deviation  noted during stress.  Defect 1: There is a large defect of severe severity.  Findings consistent with ischemia.  This is a high risk study.  Nuclear stress EF: 63%.  No T wave inversion was noted during stress.  Large size, severe intensity reversible apical and anterolateral perfusion defect suggestive of ischemia. LVEF 63% with normal wall motion. This is a high risk study.  Patient Profile     84 y.o. female with a CAD (noted to have coronary calcifications in LAD/RCA on CT 2017), chronic combined CHF (last echo 2017 with EF 48% and G1DD), and paroxysmal atrial fibrillation not on anticoagulation, AAA, prior CVA, breast cancer with recurrent pleural effusions s/p L pleural decortication 2017, who is being followed by cardiology for chest pain   Assessment & Plan    1. Chest pain in patient with CAD noted on prior CT chest: patient presented with 2 episodes of chest pressure which awoke her from sleep, most recently the morning of 09/19/18. Trops negative. EKG non-ischemic. She underwent a NST 09/20/18 which revealed a large size reversible defect in the apical/anterolateral region. She was started on a heparin gtt overnight given degree of defect. Planned for LHC today to further evaluate coronary anatomy.  - Keep NPO this morning - LHC today - Echo pending - Continue aspirin and statin - Continue heparin gtt for now  2. DOE in patient with history of chronic combined CHF: EF on NST estimated to be 63%. Echo pending. Has not tolerated BBlockers or ACEi in the past. Appears euvolemic on exam - Will f/u echo  3. Paroxysmal atrial fibrillation: occurred postoperatively in 2017. No evidence of arrhythmia on telemetry this admission. She reports intolerance to xarelto in the past.  - Will hold off on anticoagulation at this time. Low threshold for starting apixaban if reoccurs given elevated stroke risk.   4. AAA: follows outpatient with Vascular surgery - Continue routine outpatient  monitoring  5. Murmur: suspect AS.  - Echo pending  6. Elevated Ddimer: LE doppler negative for DVT. Primary team considering V/Q scan pending cardiac work-up.  - Will defer management to primary team     For questions or updates, please contact [place] Please consult [email or web address] for contact info under Cardiology/STEMI.      Signed, Abigail Butts, PA-C  09/21/2018, 7:52 AM   [phone]

## 2018-09-21 NOTE — Progress Notes (Signed)
[place] for Heparin Indication: Abnormal stress test  Allergies  Allergen Reactions  . Lipitor [Atorvastatin] Other (See Comments)    Dizzy and make her head feel huge     Patient Measurements: Height: 5\' 2"  (157.5 cm) Weight: 139 lb (63 kg) IBW/kg (Calculated) : 50.1 Heparin Dosing Weight: 63 kg  Vital Signs: Temp: 98 F (36.7 C) (09/30 1712) Temp Source: Oral (09/30 1712) BP: 184/95 (09/30 2300) Pulse Rate: 80 (09/30 2300)  Labs: Recent Labs    09/19/18 0926 09/19/18 1835 09/20/18 0705 09/21/18 0043  HGB 13.4  --  12.8  --   HCT 42.6  --  39.8  --   PLT 259  --  218  --   HEPARINUNFRC  --   --   --  1.10*  CREATININE 1.43*  --  1.17*  --   TROPONINI  --  <0.03  --   --     Estimated Creatinine Clearance: 31.2 mL/min (A) (by C-G formula based on SCr of 1.17 mg/dL (H)).   Medical History: Past Medical History:  Diagnosis Date  . Breast cancer ([place])   . CHF (congestive heart failure) ([place])   . Heart murmur   . History of bronchitis   . Hypercholesteremia   . Numbness and tingling in left hand   . Pleural effusion on left   . Shortness of breath dyspnea    "only going up a flight of stairs"  . Stroke [facility]) 2014   left sided weakness - mainly left hand    Assessment: 84 yo lady on heparin therapy for CP.  Her initial heparin level is 1.1 units/ml Goal of Therapy:  Heparin level 0.3-0.7 units/ml Monitor platelets by anticoagulation protocol: Yes   Plan:  Decrease heparin to 800 units/hr Check heparin level in 6 hrs Daily HL and CBC   Jamarquis Crull [place] 09/21/2018,1:40 AM

## 2018-09-22 DIAGNOSIS — I200 Unstable angina: Secondary | ICD-10-CM

## 2018-09-22 LAB — CBC
HCT: 37.9 % (ref 36.0–46.0)
HEMOGLOBIN: 12 g/dL (ref 12.0–15.0)
MCH: 29.7 pg (ref 26.0–34.0)
MCHC: 31.7 g/dL (ref 30.0–36.0)
MCV: 93.8 fL (ref 78.0–100.0)
Platelets: 213 10*3/uL (ref 150–400)
RBC: 4.04 MIL/uL (ref 3.87–5.11)
RDW: 13.2 % (ref 11.5–15.5)
WBC: 5.4 10*3/uL (ref 4.0–10.5)

## 2018-09-22 LAB — BASIC METABOLIC PANEL
Anion gap: 8 (ref 5–15)
BUN: 11 mg/dL (ref 8–23)
CHLORIDE: 107 mmol/L (ref 98–111)
CO2: 23 mmol/L (ref 22–32)
CREATININE: 1.32 mg/dL — AB (ref 0.44–1.00)
Calcium: 8.4 mg/dL — ABNORMAL LOW (ref 8.9–10.3)
GFR calc Af Amer: 42 mL/min — ABNORMAL LOW (ref >60)
GFR calc non Af Amer: 36 mL/min — ABNORMAL LOW (ref >60)
Glucose, Bld: 98 mg/dL (ref 70–99)
Potassium: 3.8 mmol/L (ref 3.5–5.1)
SODIUM: 138 mmol/L (ref 135–145)

## 2018-09-22 LAB — POCT ACTIVATED CLOTTING TIME: Activated Clotting Time: 180 seconds

## 2018-09-22 MED ORDER — MAGNESIUM HYDROXIDE 400 MG/5ML PO SUSP
30.0000 mL | Freq: Once | ORAL | Status: AC
  Administered 2018-09-22: 30 mL via ORAL
  Filled 2018-09-22: qty 30

## 2018-09-22 NOTE — Progress Notes (Addendum)
[facility] PROGRESS NOTE  Leslie Carr CWC:[id number] DOB: 02-25-1931 DOA: 09/19/2018  PCP: Camille Bal, PA-C  Brief History/Interval Summary: 84 year old Caucasian female with a past medical history significant for breast cancer status post mastectomy, hyperlipidemia, CVA, paroxysmal atrial fibrillation, left pleural effusion, benign lung nodule, thoracic aortic aneurysm presented with complaints of chest pain or shortness of breath.  She was hospitalized for further evaluation.  Seen by cardiology and underwent cardiac catheterization.  She will require two-stage procedure for PCI.  Reason for Visit: Coronary artery disease  Consultants: Cardiology  Procedures:  Cardiac catheterization 10/1  Conclusion     The left ventricular systolic function is normal. Ejection Fraction is 55-65% by visual estimate.  LV end diastolic pressure is normal.  There is mild aortic valve stenosis.  ______________________________________________________________________  Lesion #1: Ost 2nd Mrg to 2nd Mrg lesion is 85% stenosed.  A drug-eluting stent was successfully placed using a STENT SIERRA 2.50 X 18 MM.  Post intervention, there is a a focal area of 5% residual stenosis.  Dist Cx lesion is 45% stenosed.  ______________________________________________________________________  Lesion segment #2-3: Ost RCA lesion is 70% stenosed. Prox RCA to Mid RCA lesion is 70% stenosed. Mid RCA-1 lesion is 95% stenosed. Mid RCA-2 lesion is 50% stenosed.  Dist RCA lesion is 30% stenosed. Ost RPDA lesion is 30% stenosed.   Severe two-vessel disease: Proximal 2nd OM 85%, extensive calcified RCA disease -near ostial calcified 70%, tubular proximal 70%, mid 95%. 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 IV hydration and monitor renal function Tentatively plan for staged orbital atherectomy (CSI)-PCI of RCA on either Thursday, October 3 or Friday, September 24, 2018. Patient was significant hypertensive during the case, however was normotensive post PCI  Recommend uninterrupted dual antiplatelet therapy with Aspirin 81mg  daily and Clopidogrel 75mg  daily for a minimum of 12 months (ACS - Class I recommendation).  Would be okay to stop aspirin after 6 months if necessary for bleeding.  With extensive PCI of the RCA pending, may consider continuing Plavix beyond 12 months to 24 months.    Transthoracic echocardiogram 10/1 Study Conclusions  - Left ventricle: The cavity size was normal. Wall thickness was   normal. Systolic function was normal. The estimated ejection   fraction was in the range of 60% to 65%. Doppler parameters are   consistent with abnormal left ventricular relaxation (grade 1   diastolic dysfunction). Doppler parameters are consistent with   elevated ventricular end-diastolic filling pressure. - Aortic valve: There was mild stenosis. Valve area (VTI): 1.14   cm^2. Valve area (Vmax): 1.02 cm^2. Valve area (Vmean): 1.11   cm^2. - Mitral valve: Calcified annulus. - Left atrium: The atrium was mildly dilated. - Atrial septum: There was increased thickness of the septum,   consistent with lipomatous hypertrophy. No defect or patent   foramen ovale was identified.  Antibiotics: None  Subjective/Interval History: Patient denies any chest pain currently.  Poor historian.  Denies any shortness of breath.  No nausea or vomiting.  ROS: Denies any headaches.  Objective:  Vital Signs  Vitals:   09/22/18 0500 09/22/18 0600 09/22/18 0700 09/22/18 0900  BP: (!) 147/87 (!) 158/90 (!) 144/88 (!) 141/76  Pulse: 79 77 80 83  Resp:  19 13 15 17   Temp:      TempSrc:      SpO2: 96% 97% 96% 100%    Weight:      Height:       No intake or output data in the 24 hours ending 09/22/18 1159 Filed Weights   09/19/18 0917 09/21/18 0403  Weight: 63 kg 61.5 kg    General appearance: alert, cooperative, appears stated age and no distress Head: Normocephalic, without obvious abnormality, atraumatic Resp: clear to auscultation bilaterally Cardio: S1-S2 normal regular.  Systolic murmur appreciated over the precordium.  No S3-S4. GI: soft, non-tender; bowel sounds normal; no masses,  no organomegaly Extremities: extremities normal, atraumatic, no cyanosis or edema Pulses: 2+ and symmetric Neurologic: Alert and oriented x3.  No focal neurological deficits.  Lab Results:  Data Reviewed: I have personally reviewed following labs and imaging studies  CBC: Recent Labs  Lab 09/19/18 0926 09/20/18 0705 09/21/18 0642 09/22/18 0215  WBC 6.1 4.3 4.8 5.4  HGB 13.4 12.8 12.4 12.0  HCT 42.6 39.8 38.5 37.9  MCV 94.5 94.1 93.2 93.8  PLT 259 218 227 151    Basic Metabolic Panel: Recent Labs  Lab 09/19/18 0926 09/20/18 0705 09/21/18 0726 09/22/18 0215  NA 140 141 140 138  K 4.4 4.1 4.1 3.8  CL 107 111 111 107  CO2 25 25 20* 23  GLUCOSE 101* 92 88 98  BUN 20 15 14 11   CREATININE 1.43* 1.17* 1.19* 1.32*  CALCIUM 8.8* 8.3* 8.4* 8.4*    GFR: Estimated Creatinine Clearance: 27.4 mL/min (A) (by C-G formula based on SCr of 1.32 mg/dL (H)).  Cardiac Enzymes: Recent Labs  Lab 09/19/18 1835  TROPONINI <0.03    HbA1C: Recent Labs    09/20/18 0705  HGBA1C 5.7*    Lipid Profile: Recent Labs    09/20/18 0705  CHOL 143  HDL 36*  LDLCALC 90  TRIG 85  CHOLHDL 4.0      Radiology Studies: Nm Myocar Multi W/spect W/wall Motion / Ef  Result Date: 09/20/2018  There was no ST segment deviation noted during stress.  Defect 1: There is a large defect of severe severity.  Findings consistent with ischemia.  This is a high risk study.  Nuclear stress EF: 63%.  No T wave  inversion was noted during stress.  Large size, severe intensity reversible apical and anterolateral perfusion defect suggestive of ischemia. LVEF 63% with normal wall motion. This is a high risk study.     Medications:  Scheduled: . aspirin EC  81 mg Oral Daily  . clopidogrel  75 mg Oral Q breakfast  . docusate sodium  100 mg Oral QPM  . heparin  5,000 Units Subcutaneous Q8H  . pravastatin  20 mg Oral QPM  . sodium chloride flush  3 mL Intravenous Q12H  . vitamin B-12  1,000 mcg Oral Daily   Continuous: . sodium chloride     VOH:YWVPXT chloride, morphine injection, nitroGLYCERIN, ondansetron (ZOFRAN) IV, sodium chloride flush  Assessment/Plan:    Angina/coronary artery disease Patient with known history of coronary artery disease based on cardiac CT.  She underwent stress test which was high risk.  Subsequently underwent cardiac catheterization.  Drug-eluting stent was placed in the second OM.  Significant stenosis also noted in the RCA.  Tentative plan for atherectomy on Friday.  Patient is on aspirin Plavix.  Patient on statin.  Cardiology is following.  Not noted to be on beta-blocker.  Will defer to them.  Apparently could not tolerate beta-blocker or ACE inhibitor in the past due to fatigue.  Dysphagia, unspecified Apparently has difficulty swallowing at baseline.  Not an active issue currently.  Elevated blood pressure Has not tolerated beta-blocker or ACE inhibitor in the past.  Stable currently.  Acute kidney injury She likely has chronic kidney disease stage III.  Mild increase in creatinine noted today compared to yesterday.  Recheck tomorrow.  Elevated d-dimer Unclear significance considering abnormal stress test and abnormal cardiac catheterization.  No further work-up at this time.  Chronic combined systolic and diastolic CHF Seems to be well compensated.  Continue to monitor closely.  Echocardiogram showed normal systolic function.  History of paroxysmal atrial  fibrillation Not on anticoagulation currently.  Apparently was on anticoagulation previously.  Will defer this to outpatient providers and cardiology.  Transthoracic aortic aneurysm Followed by cardiothoracic surgery in the outpatient setting.  History of stroke Continue antiplatelet agents and statins.  Chronic lung nodule Noted to be benign on biopsy.  Outpatient surveillance.  DVT Prophylaxis: Subcutaneous heparin    Code Status: Full code Family Communication: No family at bedside Disposition Plan: Discussed with the patient    LOS: 1 day   [facility] Pager [phone] 09/22/2018, 11:59 AM  If 7PM-7AM, please contact night-coverage at [email or web address], password [facility]

## 2018-09-22 NOTE — Progress Notes (Signed)
CARDIAC REHAB PHASE I   PRE:  Rate/Rhythm: 77 SR    BP: sitting 119/52    SaO2: 96 RA  MODE:  Ambulation: 150 ft   POST:  Rate/Rhythm: 108 ST    BP: sitting 127/64     SaO2: 94 RA  Pt eager to get out of bed and walk. Felt weak from being in bed and therefore used RW and gait belt. Steady, denied CP or SOB. Happy to walk and go to recliner. Discussed stent card and CRPII. Will refer to [place]. Daughter with pt at this time and can walk with her. We will f/u tomorrow. [phone]   [place], ACSM 09/22/2018 11:54 AM

## 2018-09-22 NOTE — Progress Notes (Signed)
Progress Note  Patient Name: Leslie Carr Date of Encounter: 09/22/2018  Primary Cardiologist: Sinclair Grooms, MD    Subjective   No angina BP is fine   Inpatient Medications    Scheduled Meds: . aspirin EC  81 mg Oral Daily  . clopidogrel  75 mg Oral Q breakfast  . docusate sodium  100 mg Oral QPM  . heparin  5,000 Units Subcutaneous Q8H  . pravastatin  20 mg Oral QPM  . sodium chloride flush  3 mL Intravenous Q12H  . vitamin B-12  1,000 mcg Oral Daily   Continuous Infusions: . sodium chloride     PRN Meds: sodium chloride, morphine injection, nitroGLYCERIN, ondansetron (ZOFRAN) IV, sodium chloride flush   Vital Signs    Vitals:   09/22/18 0400 09/22/18 0500 09/22/18 0600 09/22/18 0700  BP: (!) 149/83 (!) 147/87 (!) 158/90 (!) 144/88  Pulse: 81 79 77 80  Resp: 18 19 13 15   Temp: 98.2 F (36.8 C)     TempSrc: Oral     SpO2: 93% 96% 97% 96%  Weight:      Height:        Intake/Output Summary (Last 24 hours) at 09/22/2018 0808 Last data filed at 09/21/2018 0817 Gross per 24 hour  Intake 131.46 ml  Output -  Net 131.46 ml   Filed Weights   09/19/18 0917 09/21/18 0403  Weight: 63 kg 61.5 kg    Telemetry    NSR  - Personally Reviewed  ECG    NSR no acute ST chagnes  - Personally Reviewed  Physical Exam  Elderly white female  GEN: No acute distress.   Neck: No JVD Cardiac: RRR, mild AS murmurs, rubs, or gallops.  Respiratory: Clear to auscultation bilaterally. GI: Soft, nontender, non-distended  MS: No edema; No deformity. Neuro:  Nonfocal  Psych: Normal affect  Right FA cath sight ok no bruit   Labs    Chemistry Recent Labs  Lab 09/20/18 0705 09/21/18 0726 09/22/18 0215  NA 141 140 138  K 4.1 4.1 3.8  CL 111 111 107  CO2 25 20* 23  GLUCOSE 92 88 98  BUN 15 14 11   CREATININE 1.17* 1.19* 1.32*  CALCIUM 8.3* 8.4* 8.4*  GFRNONAA 42* 41* 36*  GFRAA 48* 47* 42*  ANIONGAP 5 9 8      Hematology Recent Labs  Lab 09/20/18 0705  09/21/18 0642 09/22/18 0215  WBC 4.3 4.8 5.4  RBC 4.23 4.13 4.04  HGB 12.8 12.4 12.0  HCT 39.8 38.5 37.9  MCV 94.1 93.2 93.8  MCH 30.3 30.0 29.7  MCHC 32.2 32.2 31.7  RDW 13.2 13.1 13.2  PLT 218 227 213    Cardiac Enzymes Recent Labs  Lab 09/19/18 1835  TROPONINI <0.03    Recent Labs  Lab 09/19/18 0930 09/19/18 1445  TROPIPOC 0.00 0.01     BNPNo results for input(s): BNP, PROBNP in the last 168 hours.   DDimer  Recent Labs  Lab 09/19/18 1126  DDIMER 1.68*     Radiology    Nm Myocar Multi W/spect W/wall Motion / Ef  Result Date: 09/20/2018  There was no ST segment deviation noted during stress.  Defect 1: There is a large defect of severe severity.  Findings consistent with ischemia.  This is a high risk study.  Nuclear stress EF: 63%.  No T wave inversion was noted during stress.  Large size, severe intensity reversible apical and anterolateral perfusion defect suggestive of ischemia.  LVEF 63% with normal wall motion. This is a high risk study.    Cardiac Studies   TTE:  EF 60-65% mild AS   Patient Profile     84 y.o. female admitted with chest pain myovue high risk with large area of anterolateral ischemia History of murmur with mild AS on TTE this admission   Assessment & Plan    1. Chest pain:  Post DES to OM with staged orbital atherectomy to RCA planned ? Thursday or Friday will Discuss with Dr Ellyn Hack DAT Given hypotension with first case and need for RCA intervention will not Be too aggressive with BP lowering until after 2 nd procedure  2. AS:  Mild by TTE done this am mean gradient 14 mmHg peak 24 mmHg        For questions or updates, please contact [place] Please consult [email or web address] for contact info under        Signed, Jenkins Rouge, MD  09/22/2018, 8:08 AM

## 2018-09-23 LAB — CBC
HCT: 37.8 % (ref 36.0–46.0)
Hemoglobin: 12.5 g/dL (ref 12.0–15.0)
MCH: 30.8 pg (ref 26.0–34.0)
MCHC: 33.1 g/dL (ref 30.0–36.0)
MCV: 93.1 fL (ref 78.0–100.0)
Platelets: 218 10*3/uL (ref 150–400)
RBC: 4.06 MIL/uL (ref 3.87–5.11)
RDW: 13.2 % (ref 11.5–15.5)
WBC: 5.4 10*3/uL (ref 4.0–10.5)

## 2018-09-23 LAB — BASIC METABOLIC PANEL
ANION GAP: 7 (ref 5–15)
BUN: 13 mg/dL (ref 8–23)
CHLORIDE: 109 mmol/L (ref 98–111)
CO2: 23 mmol/L (ref 22–32)
Calcium: 8.7 mg/dL — ABNORMAL LOW (ref 8.9–10.3)
Creatinine, Ser: 1.33 mg/dL — ABNORMAL HIGH (ref 0.44–1.00)
GFR calc non Af Amer: 36 mL/min — ABNORMAL LOW (ref >60)
GFR, EST AFRICAN AMERICAN: 41 mL/min — AB (ref >60)
Glucose, Bld: 110 mg/dL — ABNORMAL HIGH (ref 70–99)
POTASSIUM: 4.1 mmol/L (ref 3.5–5.1)
Sodium: 139 mmol/L (ref 135–145)

## 2018-09-23 LAB — MRSA PCR SCREENING: MRSA by PCR: NEGATIVE

## 2018-09-23 MED ORDER — SODIUM CHLORIDE 0.9 % IV SOLN: 250.0000 mL | INTRAVENOUS | Status: DC | PRN

## 2018-09-23 MED ORDER — SODIUM CHLORIDE 0.9 % WEIGHT BASED INFUSION: 1.0000 mL/kg/h | INTRAVENOUS | Status: DC

## 2018-09-23 MED ORDER — SODIUM CHLORIDE 0.9 % WEIGHT BASED INFUSION
3.0000 mL/kg/h | INTRAVENOUS | Status: DC
  Administered 2018-09-24: 3 mL/kg/h via INTRAVENOUS

## 2018-09-23 MED ORDER — SODIUM CHLORIDE 0.45 % IV SOLN
INTRAVENOUS | Status: AC
  Administered 2018-09-23: 08:00:00 via INTRAVENOUS

## 2018-09-23 MED ORDER — SODIUM CHLORIDE 0.9% FLUSH: 3.0000 mL | INTRAVENOUS | Status: DC | PRN

## 2018-09-23 MED ORDER — ASPIRIN 81 MG PO CHEW
81.0000 mg | CHEWABLE_TABLET | ORAL | Status: AC
  Administered 2018-09-24: 81 mg via ORAL
  Filled 2018-09-23: qty 1

## 2018-09-23 MED ORDER — SODIUM CHLORIDE 0.9% FLUSH
3.0000 mL | Freq: Two times a day (BID) | INTRAVENOUS | Status: DC
  Administered 2018-09-24: 3 mL via INTRAVENOUS

## 2018-09-23 NOTE — Progress Notes (Signed)
CARDIAC REHAB PHASE I   PRE:  Rate/Rhythm: 98 SR  BP:  Sitting: 129/72      SaO2: 97 RA  MODE:  Ambulation: 150 ft 124 peak HR  POST:  Rate/Rhythm: 97 SR  BP:  Sitting: 106/68    SaO2: 98 RA   Pt helped to bathroom, then ambulated 11ft in hallway assist of one with front wheel walker. Pt denies CP or SOB. Pt states she feels good today. Pt returned to recliner, call bell and phone within reach. Pt for staged intervention tomorrow. Will continue to follow.  [phone] Leslie Falco, RN BSN 09/23/2018 9:35 AM

## 2018-09-23 NOTE — Progress Notes (Signed)
Progress Note  Patient Name: [name] Date of Encounter: 09/23/2018  Primary Cardiologist: Sinclair Grooms, MD    Subjective   No angina BP is fine worried about procedure tomorrow   Inpatient Medications    Scheduled Meds: . aspirin EC  81 mg Oral Daily  . clopidogrel  75 mg Oral Q breakfast  . docusate sodium  100 mg Oral QPM  . heparin  5,000 Units Subcutaneous Q8H  . pravastatin  20 mg Oral QPM  . sodium chloride flush  3 mL Intravenous Q12H  . vitamin B-12  1,000 mcg Oral Daily   Continuous Infusions: . sodium chloride    . sodium chloride     PRN Meds: sodium chloride, morphine injection, nitroGLYCERIN, ondansetron (ZOFRAN) IV, sodium chloride flush   Vital Signs    Vitals:   09/22/18 2010 09/22/18 2100 09/22/18 2353 09/23/18 0500  BP: 134/84 138/75 140/78 133/78  Pulse: 87  90 86  Resp: 16 (!) 21 16 17   Temp: 98.2 F (36.8 C)  98.2 F (36.8 C) (!) 97.4 F (36.3 C)  TempSrc: Oral  Oral Oral  SpO2: 100%  100% 96%  Weight:      Height:       No intake or output data in the 24 hours ending 09/23/18 0718 Filed Weights   09/19/18 0917 09/21/18 0403  Weight: 63 kg 61.5 kg    Telemetry    NSR  - Personally Reviewed  ECG    NSR no acute ST chagnes  - Personally Reviewed  Physical Exam  Elderly white female  GEN: No acute distress.   Neck: No JVD Cardiac: RRR, mild AS murmurs, rubs, or gallops.  Respiratory: Clear to auscultation bilaterally. GI: Soft, nontender, non-distended  MS: No edema; No deformity. Neuro:  Nonfocal  Psych: Normal affect  Right FA cath sight ok no bruit   Labs    Chemistry Recent Labs  Lab 09/21/18 0726 09/22/18 0215 09/23/18 0235  NA 140 138 139  K 4.1 3.8 4.1  CL 111 107 109  CO2 20* 23 23  GLUCOSE 88 98 110*  BUN 14 11 13   CREATININE 1.19* 1.32* 1.33*  CALCIUM 8.4* 8.4* 8.7*  GFRNONAA 41* 36* 36*  GFRAA 47* 42* 41*  ANIONGAP 9 8 7      Hematology Recent Labs  Lab 09/21/18 0642  09/22/18 0215 09/23/18 0235  WBC 4.8 5.4 5.4  RBC 4.13 4.04 4.06  HGB 12.4 12.0 12.5  HCT 38.5 37.9 37.8  MCV 93.2 93.8 93.1  MCH 30.0 29.7 30.8  MCHC 32.2 31.7 33.1  RDW 13.1 13.2 13.2  PLT 227 213 218    Cardiac Enzymes Recent Labs  Lab 09/19/18 1835  TROPONINI <0.03    Recent Labs  Lab 09/19/18 0930 09/19/18 1445  TROPIPOC 0.00 0.01     BNPNo results for input(s): BNP, PROBNP in the last 168 hours.   DDimer  Recent Labs  Lab 09/19/18 1126  DDIMER 1.68*     Radiology    No results found.  Cardiac Studies   TTE:  EF 60-65% mild AS   Patient Profile     84 y.o. female admitted with chest pain myovue high risk with large area of anterolateral ischemia History of murmur with mild AS on TTE this admission   Assessment & Plan    1. Chest pain:  Post DES to OM with staged orbital atherectomy to RCA Friday Cr is stable and in normal range  DAT Given hypotension with first case and need for RCA intervention will not Be too aggressive with BP lowering until after 2 nd procedure Then may add beta blocker  2. AS:  Mild by TTE done this am mean gradient 14 mmHg peak 24 mmHg        For questions or updates, please contact [place] Please consult [email or web address] for contact info under        Signed, Jenkins Rouge, MD  09/23/2018, 7:18 AM

## 2018-09-23 NOTE — Care Management Important Message (Signed)
Important Message  Patient Details  Name: Leslie Carr MRN: [id number] Date of Birth: [birth year]   Medicare Important Message Given:  Yes    Karsten Howry P Julio Zappia 09/23/2018, 1:21 PM

## 2018-09-23 NOTE — Plan of Care (Signed)

## 2018-09-23 NOTE — Progress Notes (Signed)
[facility] PROGRESS NOTE  Leslie Carr UVO:[id number] DOB: [birth year] DOA: 09/19/2018  PCP: Camille Bal, PA-C  Brief History/Interval Summary: 84 year old Caucasian female with a past medical history significant for breast cancer status post mastectomy, hyperlipidemia, CVA, paroxysmal atrial fibrillation, left pleural effusion, benign lung nodule, thoracic aortic aneurysm presented with complaints of chest pain or shortness of breath.  She was hospitalized for further evaluation.  Seen by cardiology and underwent cardiac catheterization.  She will require two-stage procedure for PCI.  Reason for Visit: Coronary artery disease  Consultants: Cardiology  Procedures:  Cardiac catheterization 10/1  Conclusion     The left ventricular systolic function is normal. Ejection Fraction is 55-65% by visual estimate.  LV end diastolic pressure is normal.  There is mild aortic valve stenosis.  ______________________________________________________________________  Lesion #1: Ost 2nd Mrg to 2nd Mrg lesion is 85% stenosed.  A drug-eluting stent was successfully placed using a STENT SIERRA 2.50 X 18 MM.  Post intervention, there is a a focal area of 5% residual stenosis.  Dist Cx lesion is 45% stenosed.  ______________________________________________________________________  Lesion segment #2-3: Ost RCA lesion is 70% stenosed. Prox RCA to Mid RCA lesion is 70% stenosed. Mid RCA-1 lesion is 95% stenosed. Mid RCA-2 lesion is 50% stenosed.  Dist RCA lesion is 30% stenosed. Ost RPDA lesion is 30% stenosed.   Severe two-vessel disease: Proximal 2nd OM 85%, extensive calcified RCA disease -near ostial calcified 70%, tubular proximal 70%, mid 95%. 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 IV hydration and monitor renal function Tentatively plan for staged orbital atherectomy (CSI)-PCI of RCA on either Thursday, October 3 or Friday, September 24, 2018. Patient was significant hypertensive during the case, however was normotensive post PCI  Recommend uninterrupted dual antiplatelet therapy with Aspirin 81mg  daily and Clopidogrel 75mg  daily for a minimum of 12 months (ACS - Class I recommendation).  Would be okay to stop aspirin after 6 months if necessary for bleeding.  With extensive PCI of the RCA pending, may consider continuing Plavix beyond 12 months to 24 months.    Transthoracic echocardiogram 10/1 Study Conclusions  - Left ventricle: The cavity size was normal. Wall thickness was   normal. Systolic function was normal. The estimated ejection   fraction was in the range of 60% to 65%. Doppler parameters are   consistent with abnormal left ventricular relaxation (grade 1   diastolic dysfunction). Doppler parameters are consistent with   elevated ventricular end-diastolic filling pressure. - Aortic valve: There was mild stenosis. Valve area (VTI): 1.14   cm^2. Valve area (Vmax): 1.02 cm^2. Valve area (Vmean): 1.11   cm^2. - Mitral valve: Calcified annulus. - Left atrium: The atrium was mildly dilated. - Atrial septum: There was increased thickness of the septum,   consistent with lipomatous hypertrophy. No defect or patent   foramen ovale was identified.  Antibiotics: None  Subjective/Interval History: Patient feels well.  Denies any shortness of breath or chest pain.  No nausea or vomiting.  Admits to being a slightly anxious.    ROS: Denies any headaches.  Objective:  Vital Signs  Vitals:   09/22/18 2100 09/22/18 2353 09/23/18 0500 09/23/18 0729  BP: 138/75 140/78 133/78 140/66  Pulse:  90 86 80  Resp: (!) 21 16 17 17   Temp:  98.2 F (36.8 C) (!) 97.4 F (36.3 C) 98.4 F  (36.9 C)  TempSrc:  Oral Oral Oral  SpO2:  100% 96% 92%  Weight:      Height:       No intake or output data in the 24 hours ending 09/23/18 0822 Filed Weights   09/19/18 0917 09/21/18 0403  Weight: 63 kg 61.5 kg    General appearance: Awake alert.  In no distress Resp: Normal effort at rest.  Clear to auscultation bilaterally. Cardio: S1-S2 is normal regular.  Systolic murmur appreciated over the precordium.  No S3-S4.  No rubs or bruit. GI: Abdomen soft.  Nontender nondistended.  Bowel sounds are present normal.  No masses organomegaly. Extremities: No significant edema Neurologic: Alert and oriented x3.  No focal neurological deficits.  Lab Results:  Data Reviewed: I have personally reviewed following labs and imaging studies  CBC: Recent Labs  Lab 09/19/18 0926 09/20/18 0705 09/21/18 0642 09/22/18 0215 09/23/18 0235  WBC 6.1 4.3 4.8 5.4 5.4  HGB 13.4 12.8 12.4 12.0 12.5  HCT 42.6 39.8 38.5 37.9 37.8  MCV 94.5 94.1 93.2 93.8 93.1  PLT 259 218 227 213 646    Basic Metabolic Panel: Recent Labs  Lab 09/19/18 0926 09/20/18 0705 09/21/18 0726 09/22/18 0215 09/23/18 0235  NA 140 141 140 138 139  K 4.4 4.1 4.1 3.8 4.1  CL 107 111 111 107 109  CO2 25 25 20* 23 23  GLUCOSE 101* 92 88 98 110*  BUN 20 15 14 11 13   CREATININE 1.43* 1.17* 1.19* 1.32* 1.33*  CALCIUM 8.8* 8.3* 8.4* 8.4* 8.7*    GFR: Estimated Creatinine Clearance: 27.2 mL/min (A) (by C-G formula based on SCr of 1.33 mg/dL (H)).  Cardiac Enzymes: Recent Labs  Lab 09/19/18 [place] <0.03     Radiology Studies: No results found.   Medications:  Scheduled: . aspirin EC  81 mg Oral Daily  . clopidogrel  75 mg Oral Q breakfast  . docusate sodium  100 mg Oral QPM  . heparin  5,000 Units Subcutaneous Q8H  . pravastatin  20 mg Oral QPM  . sodium chloride flush  3 mL Intravenous Q12H  . vitamin B-12  1,000 mcg Oral Daily   Continuous: . sodium chloride 75 mL/hr at 09/23/18 0813  .  sodium chloride     OEH:OZYYQM chloride, morphine injection, nitroGLYCERIN, ondansetron (ZOFRAN) IV, sodium chloride flush  Assessment/Plan:    Angina/coronary artery disease Patient with known history of coronary artery disease based on cardiac CT.  She underwent stress test which was high risk.  Subsequently underwent cardiac catheterization.  Drug-eluting stent was placed in the second OM.  Significant stenosis also noted in the RCA.  Tentative plan for atherectomy on Friday.  Patient is on aspirin Plavix.  Patient on statin.  Cardiology is following.  Not noted to be on beta-blocker. Apparently could not tolerate beta-blocker or ACE inhibitor in the past due to fatigue.  Patient remains stable from a cardiac standpoint.  Await cardiac catheterization tomorrow.  Dysphagia, unspecified Apparently has difficulty swallowing at baseline.  Not an active issue currently.  Outpatient follow-up with PCP.  Elevated blood pressure Has not tolerated beta-blocker or ACE inhibitor in the past.  Stable currently.  Acute kidney injury She likely has chronic kidney disease stage III.  Mild increase in creatinine was noted yesterday.  Stable this morning.  Patient to get contrast with cardiac  catheterization tomorrow.  We will gently hydrate her.  Monitor urine output.  Elevated d-dimer Unclear significance considering abnormal stress test and abnormal cardiac catheterization.  No further work-up at this time.  Chronic combined systolic and diastolic CHF Seems to be well compensated.  Continue to monitor closely.  Echocardiogram showed normal systolic function.  History of paroxysmal atrial fibrillation Not on anticoagulation currently.  Apparently was on anticoagulation previously.  Will defer this to outpatient providers and cardiology.  Thoracic aortic aneurysm Followed by cardiothoracic surgery in the outpatient setting.  History of stroke Continue antiplatelet agents and statins.  Chronic  lung nodule Noted to be benign on biopsy.  Outpatient surveillance.  DVT Prophylaxis: Subcutaneous heparin    Code Status: Full code Family Communication: Discussed with patient.  No family at bedside. Disposition Plan: Management as outlined above.  Cardiac catheterization tomorrow.    LOS: 2 days   [facility] Pager [phone] 09/23/2018, 8:22 AM  If 7PM-7AM, please contact night-coverage at [email or web address], password [facility]

## 2018-09-24 ENCOUNTER — Encounter (HOSPITAL_COMMUNITY)
Admission: EM | Disposition: A | Discharge status: Home / Self Care | Payer: Self-pay | Source: Home / Self Care | Attending: Internal Medicine

## 2018-09-24 DIAGNOSIS — I25119 Atherosclerotic heart disease of native coronary artery with unspecified angina pectoris: Secondary | ICD-10-CM

## 2018-09-24 HISTORY — PX: CORONARY ATHERECTOMY: CATH118238

## 2018-09-24 LAB — POCT ACTIVATED CLOTTING TIME
Activated Clotting Time: 197 seconds
Activated Clotting Time: >1000 seconds
Activated Clotting Time: 235 seconds
Activated Clotting Time: 268 seconds
Activated Clotting Time: 290 seconds
Activated Clotting Time: 213 seconds
Activated Clotting Time: 169 seconds

## 2018-09-24 LAB — BASIC METABOLIC PANEL
ANION GAP: 9 (ref 5–15)
BUN: 9 mg/dL (ref 8–23)
CO2: 24 mmol/L (ref 22–32)
Calcium: 8.4 mg/dL — ABNORMAL LOW (ref 8.9–10.3)
Chloride: 106 mmol/L (ref 98–111)
Creatinine, Ser: 1.16 mg/dL — ABNORMAL HIGH (ref 0.44–1.00)
GFR calc Af Amer: 49 mL/min — ABNORMAL LOW (ref >60)
GFR calc non Af Amer: 42 mL/min — ABNORMAL LOW (ref >60)
GLUCOSE: 108 mg/dL — AB (ref 70–99)
POTASSIUM: 3.9 mmol/L (ref 3.5–5.1)
Sodium: 139 mmol/L (ref 135–145)

## 2018-09-24 LAB — CBC
HEMATOCRIT: 37.8 % (ref 36.0–46.0)
Hemoglobin: 12.1 g/dL (ref 12.0–15.0)
MCH: 29.9 pg (ref 26.0–34.0)
MCHC: 32 g/dL (ref 30.0–36.0)
MCV: 93.3 fL (ref 78.0–100.0)
Platelets: 226 10*3/uL (ref 150–400)
RBC: 4.05 MIL/uL (ref 3.87–5.11)
RDW: 13.2 % (ref 11.5–15.5)
WBC: 5.9 10*3/uL (ref 4.0–10.5)

## 2018-09-24 SURGERY — CORONARY ATHERECTOMY
Anesthesia: LOCAL

## 2018-09-24 MED ORDER — MIDAZOLAM HCL 2 MG/2ML IJ SOLN
INTRAMUSCULAR | Status: DC | PRN
  Administered 2018-09-24: 2 mg via INTRAVENOUS
  Administered 2018-09-24 (×3): 1 mg via INTRAVENOUS

## 2018-09-24 MED ORDER — NITROGLYCERIN 1 MG/10 ML FOR IR/CATH LAB
INTRA_ARTERIAL | Status: AC
  Filled 2018-09-24: qty 10

## 2018-09-24 MED ORDER — VIPERSLIDE LUBRICANT OPTIME
TOPICAL | Status: DC | PRN
  Administered 2018-09-24: 14:00:00 via SURGICAL_CAVITY

## 2018-09-24 MED ORDER — MIDAZOLAM HCL 2 MG/2ML IJ SOLN
INTRAMUSCULAR | Status: AC
  Filled 2018-09-24: qty 2

## 2018-09-24 MED ORDER — IOHEXOL 350 MG/ML SOLN
INTRAVENOUS | Status: DC | PRN
  Administered 2018-09-24: 145 mL via INTRA_ARTERIAL

## 2018-09-24 MED ORDER — DOPAMINE-DEXTROSE 3.2-5 MG/ML-% IV SOLN
INTRAVENOUS | Status: AC
  Filled 2018-09-24: qty 250

## 2018-09-24 MED ORDER — ONDANSETRON HCL 4 MG/2ML IJ SOLN
INTRAMUSCULAR | Status: DC | PRN
  Administered 2018-09-24: 4 mg via INTRAVENOUS

## 2018-09-24 MED ORDER — SODIUM CHLORIDE 0.9 % IV SOLN
INTRAVENOUS | Status: AC | PRN
  Administered 2018-09-24: 10 mL/h via INTRAVENOUS

## 2018-09-24 MED ORDER — SODIUM CHLORIDE 0.9% FLUSH: 3.0000 mL | INTRAVENOUS | Status: DC | PRN

## 2018-09-24 MED ORDER — ANGIOPLASTY BOOK
Freq: Once | Status: AC
  Administered 2018-09-24: 1
  Filled 2018-09-24: qty 1

## 2018-09-24 MED ORDER — HEPARIN SODIUM (PORCINE) 1000 UNIT/ML IJ SOLN
INTRAMUSCULAR | Status: DC | PRN
  Administered 2018-09-24: 2000 [IU] via INTRAVENOUS
  Administered 2018-09-24: 5000 [IU] via INTRAVENOUS
  Administered 2018-09-24: 4000 [IU] via INTRAVENOUS
  Administered 2018-09-24: 3000 [IU] via INTRAVENOUS

## 2018-09-24 MED ORDER — NITROGLYCERIN 1 MG/10 ML FOR IR/CATH LAB
INTRA_ARTERIAL | Status: DC | PRN
  Administered 2018-09-24 (×3): 200 ug via INTRACORONARY

## 2018-09-24 MED ORDER — HYDRALAZINE HCL 20 MG/ML IJ SOLN: 5.0000 mg | INTRAMUSCULAR | Status: AC | PRN

## 2018-09-24 MED ORDER — SODIUM CHLORIDE 0.9 % IV SOLN: 250.0000 mL | INTRAVENOUS | Status: DC | PRN

## 2018-09-24 MED ORDER — HEPARIN (PORCINE) IN NACL 1000-0.9 UT/500ML-% IV SOLN
INTRAVENOUS | Status: AC
  Filled 2018-09-24: qty 1500

## 2018-09-24 MED ORDER — LIDOCAINE HCL (PF) 1 % IJ SOLN
INTRAMUSCULAR | Status: AC
  Filled 2018-09-24: qty 30

## 2018-09-24 MED ORDER — LABETALOL HCL 5 MG/ML IV SOLN: 10.0000 mg | INTRAVENOUS | Status: AC | PRN

## 2018-09-24 MED ORDER — HEPARIN (PORCINE) IN NACL 1000-0.9 UT/500ML-% IV SOLN
INTRAVENOUS | Status: DC | PRN
  Administered 2018-09-24 (×2): 500 mL

## 2018-09-24 MED ORDER — ONDANSETRON HCL 4 MG/2ML IJ SOLN
INTRAMUSCULAR | Status: AC
  Filled 2018-09-24: qty 2

## 2018-09-24 MED ORDER — FENTANYL CITRATE (PF) 100 MCG/2ML IJ SOLN
INTRAMUSCULAR | Status: DC | PRN
  Administered 2018-09-24: 25 ug via INTRAVENOUS
  Administered 2018-09-24: 50 ug via INTRAVENOUS
  Administered 2018-09-24 (×2): 25 ug via INTRAVENOUS

## 2018-09-24 MED ORDER — LIDOCAINE HCL (PF) 1 % IJ SOLN
INTRAMUSCULAR | Status: DC | PRN
  Administered 2018-09-24: 20 mL

## 2018-09-24 MED ORDER — ACETAMINOPHEN 325 MG PO TABS
650.0000 mg | ORAL_TABLET | ORAL | Status: DC | PRN
  Administered 2018-09-25: 650 mg via ORAL
  Filled 2018-09-24: qty 2

## 2018-09-24 MED ORDER — FENTANYL CITRATE (PF) 100 MCG/2ML IJ SOLN
INTRAMUSCULAR | Status: AC
  Filled 2018-09-24: qty 2

## 2018-09-24 MED ORDER — SODIUM CHLORIDE 0.9% FLUSH: 3.0000 mL | Freq: Two times a day (BID) | INTRAVENOUS | Status: DC

## 2018-09-24 MED ORDER — DOPAMINE-DEXTROSE 3.2-5 MG/ML-% IV SOLN
INTRAVENOUS | Status: DC | PRN
  Administered 2018-09-24: 5 ug/kg/min via INTRAVENOUS

## 2018-09-24 MED ORDER — SODIUM CHLORIDE 0.9 % IV SOLN
INTRAVENOUS | Status: AC
  Administered 2018-09-24: 17:00:00 via INTRAVENOUS

## 2018-09-24 SURGICAL SUPPLY — 25 items
BALLN SAPPHIRE 2.0X15 (BALLOONS) ×2
BALLN SAPPHIRE ~~LOC~~ 2.5X8 (BALLOONS) ×1 IMPLANT
BALLN SAPPHIRE ~~LOC~~ 2.75X18 (BALLOONS) ×1 IMPLANT
BALLOON SAPPHIRE 2.0X15 (BALLOONS) IMPLANT
CABLE ADAPT CONN TEMP 6FT (ADAPTER) ×1 IMPLANT
CATH LAUNCHER 6FR AR1 SH (CATHETERS) ×1 IMPLANT
CATH S G BIP PACING (SET/KITS/TRAYS/PACK) ×1 IMPLANT
CATH TELEPORT (CATHETERS) ×1 IMPLANT
CROWN DIAMONDBACK CLASSIC 1.25 (BURR) ×1 IMPLANT
ELECT DEFIB PAD ADLT CADENCE (PAD) ×1 IMPLANT
KIT ENCORE 26 ADVANTAGE (KITS) ×1 IMPLANT
KIT HEART LEFT (KITS) ×2 IMPLANT
LUBRICANT VIPERSLIDE CORONARY (MISCELLANEOUS) ×1 IMPLANT
PACK CARDIAC CATHETERIZATION (CUSTOM PROCEDURE TRAY) ×2 IMPLANT
SHEATH PINNACLE 6F 10CM (SHEATH) ×2 IMPLANT
SHEATH PINNACLE 7F 10CM (SHEATH) ×1 IMPLANT
SHEATH PROBE COVER 6X72 (BAG) ×1 IMPLANT
STENT SIERRA 2.25 X 12 MM (Permanent Stent) ×1 IMPLANT
STENT SIERRA 2.50 X 33 MM (Permanent Stent) ×1 IMPLANT
STENT SIERRA 2.75 X 28 MM (Permanent Stent) ×1 IMPLANT
TRANSDUCER W/STOPCOCK (MISCELLANEOUS) ×2 IMPLANT
TUBING CIL FLEX 10 FLL-RA (TUBING) ×2 IMPLANT
WIRE ASAHI PROWATER 300CM (WIRE) ×1 IMPLANT
WIRE EMERALD 3MM-J .035X150CM (WIRE) ×1 IMPLANT
WIRE VIPER ADVANCE COR .012TIP (WIRE) ×2 IMPLANT

## 2018-09-24 NOTE — Progress Notes (Addendum)
Site area: right groin (Arterial & Venous Sheath)  Site Prior to Removal:  Level 0  Pressure Applied For 30 MINUTES    Minutes Beginning at 20:15  Manual:   Yes.    Patient Status During Pull:  WNL  Post Pull Groin Site:  Level 0  Post Pull Instructions Given:  Yes.    Post Pull Pulses Present:  Yes.    Dressing Applied:  Yes.    Comments:  Pt tolerated well.

## 2018-09-24 NOTE — Progress Notes (Signed)
Progress Note  Patient Name: [name] Date of Encounter: 09/24/2018  Primary Cardiologist: Sinclair Grooms, MD    Subjective   No angina for staged orbital atherectomy today   Inpatient Medications    Scheduled Meds: . aspirin EC  81 mg Oral Daily  . clopidogrel  75 mg Oral Q breakfast  . docusate sodium  100 mg Oral QPM  . heparin  5,000 Units Subcutaneous Q8H  . pravastatin  20 mg Oral QPM  . sodium chloride flush  3 mL Intravenous Q12H  . sodium chloride flush  3 mL Intravenous Q12H  . vitamin B-12  1,000 mcg Oral Daily   Continuous Infusions: . sodium chloride    . sodium chloride    . sodium chloride 1 mL/kg/hr (09/24/18 0511)   PRN Meds: sodium chloride, sodium chloride, morphine injection, nitroGLYCERIN, ondansetron (ZOFRAN) IV, sodium chloride flush, sodium chloride flush   Vital Signs    Vitals:   09/23/18 1959 09/23/18 2304 09/24/18 0430 09/24/18 0811  BP: (!) 142/77 138/80 (!) 169/73 (!) 158/83  Pulse: 97 86 89 76  Resp: 15 15 (!) 21 (!) 21  Temp: 98.2 F (36.8 C) 98.2 F (36.8 C) 98.1 F (36.7 C) 98.2 F (36.8 C)  TempSrc: Oral Oral Oral Oral  SpO2: 95% 96% 97% 98%  Weight:      Height:        Intake/Output Summary (Last 24 hours) at 09/24/2018 0818 Last data filed at 09/24/2018 0700 Gross per 24 hour  Intake 1776.54 ml  Output 2000 ml  Net -223.46 ml   Filed Weights   09/19/18 0917 09/21/18 0403  Weight: 63 kg 61.5 kg    Telemetry    NSR  - Personally Reviewed  ECG    NSR no acute ST chagnes  - Personally Reviewed  Physical Exam  Elderly white female  GEN: No acute distress.   Neck: No JVD Cardiac: RRR, mild AS murmurs, rubs, or gallops.  Respiratory: Clear to auscultation bilaterally. GI: Soft, nontender, non-distended  MS: No edema; No deformity. Neuro:  Nonfocal  Psych: Normal affect  Right FA cath sight ok no bruit   Labs    Chemistry Recent Labs  Lab 09/22/18 0215 09/23/18 0235 09/24/18 0300  NA 138  139 139  K 3.8 4.1 3.9  CL 107 109 106  CO2 23 23 24   GLUCOSE 98 110* 108*  BUN 11 13 9   CREATININE 1.32* 1.33* 1.16*  CALCIUM 8.4* 8.7* 8.4*  GFRNONAA 36* 36* 42*  GFRAA 42* 41* 49*  ANIONGAP 8 7 9      Hematology Recent Labs  Lab 09/22/18 0215 09/23/18 0235 09/24/18 0300  WBC 5.4 5.4 5.9  RBC 4.04 4.06 4.05  HGB 12.0 12.5 12.1  HCT 37.9 37.8 37.8  MCV 93.8 93.1 93.3  MCH 29.7 30.8 29.9  MCHC 31.7 33.1 32.0  RDW 13.2 13.2 13.2  PLT 213 218 226    Cardiac Enzymes Recent Labs  Lab 09/19/18 1835  TROPONINI <0.03    Recent Labs  Lab 09/19/18 0930 09/19/18 1445  TROPIPOC 0.00 0.01     BNPNo results for input(s): BNP, PROBNP in the last 168 hours.   DDimer  Recent Labs  Lab 09/19/18 1126  DDIMER 1.68*     Radiology    No results found.  Cardiac Studies   TTE:  EF 60-65% mild AS   Patient Profile     84 y.o. female admitted with chest pain myovue high  risk with large area of anterolateral ischemia History of murmur with mild AS on TTE this admission   Assessment & Plan    1. Chest pain:  Post DES to OM with staged orbital atherectomy to RCA planned ? Thursday or Friday will Discuss with Dr Ellyn Hack DAT Given hypotension with first case and need for RCA intervention will not Be too aggressive with BP lowering until after 2 nd procedure  2. AS:  Mild by TTE done this am mean gradient 14 mmHg peak 24 mmHg   Can probably be d/c in am if no complications from procedure       For questions or updates, please contact [place] Please consult [email or web address] for contact info under        Signed, Jenkins Rouge, MD  09/24/2018, 8:18 AM

## 2018-09-24 NOTE — H&P (View-Only) (Signed)
Progress Note  Patient Name: Leslie Carr Date of Encounter: 09/24/2018  Primary Cardiologist: Sinclair Grooms, MD    Subjective   No angina for staged orbital atherectomy today   Inpatient Medications    Scheduled Meds: . aspirin EC  81 mg Oral Daily  . clopidogrel  75 mg Oral Q breakfast  . docusate sodium  100 mg Oral QPM  . heparin  5,000 Units Subcutaneous Q8H  . pravastatin  20 mg Oral QPM  . sodium chloride flush  3 mL Intravenous Q12H  . sodium chloride flush  3 mL Intravenous Q12H  . vitamin B-12  1,000 mcg Oral Daily   Continuous Infusions: . sodium chloride    . sodium chloride    . sodium chloride 1 mL/kg/hr (09/24/18 0511)   PRN Meds: sodium chloride, sodium chloride, morphine injection, nitroGLYCERIN, ondansetron (ZOFRAN) IV, sodium chloride flush, sodium chloride flush   Vital Signs    Vitals:   09/23/18 1959 09/23/18 2304 09/24/18 0430 09/24/18 0811  BP: (!) 142/77 138/80 (!) 169/73 (!) 158/83  Pulse: 97 86 89 76  Resp: 15 15 (!) 21 (!) 21  Temp: 98.2 F (36.8 C) 98.2 F (36.8 C) 98.1 F (36.7 C) 98.2 F (36.8 C)  TempSrc: Oral Oral Oral Oral  SpO2: 95% 96% 97% 98%  Weight:      Height:        Intake/Output Summary (Last 24 hours) at 09/24/2018 0818 Last data filed at 09/24/2018 0700 Gross per 24 hour  Intake 1776.54 ml  Output 2000 ml  Net -223.46 ml   Filed Weights   09/19/18 0917 09/21/18 0403  Weight: 63 kg 61.5 kg    Telemetry    NSR  - Personally Reviewed  ECG    NSR no acute ST chagnes  - Personally Reviewed  Physical Exam  Elderly white female  GEN: No acute distress.   Neck: No JVD Cardiac: RRR, mild AS murmurs, rubs, or gallops.  Respiratory: Clear to auscultation bilaterally. GI: Soft, nontender, non-distended  MS: No edema; No deformity. Neuro:  Nonfocal  Psych: Normal affect  Right FA cath sight ok no bruit   Labs    Chemistry Recent Labs  Lab 09/22/18 0215 09/23/18 0235 09/24/18 0300  NA 138  139 139  K 3.8 4.1 3.9  CL 107 109 106  CO2 23 23 24   GLUCOSE 98 110* 108*  BUN 11 13 9   CREATININE 1.32* 1.33* 1.16*  CALCIUM 8.4* 8.7* 8.4*  GFRNONAA 36* 36* 42*  GFRAA 42* 41* 49*  ANIONGAP 8 7 9      Hematology Recent Labs  Lab 09/22/18 0215 09/23/18 0235 09/24/18 0300  WBC 5.4 5.4 5.9  RBC 4.04 4.06 4.05  HGB 12.0 12.5 12.1  HCT 37.9 37.8 37.8  MCV 93.8 93.1 93.3  MCH 29.7 30.8 29.9  MCHC 31.7 33.1 32.0  RDW 13.2 13.2 13.2  PLT 213 218 226    Cardiac Enzymes Recent Labs  Lab 09/19/18 1835  TROPONINI <0.03    Recent Labs  Lab 09/19/18 0930 09/19/18 1445  TROPIPOC 0.00 0.01     BNPNo results for input(s): BNP, PROBNP in the last 168 hours.   DDimer  Recent Labs  Lab 09/19/18 1126  DDIMER 1.68*     Radiology    No results found.  Cardiac Studies   TTE:  EF 60-65% mild AS   Patient Profile     84 y.o. female admitted with chest pain myovue high  risk with large area of anterolateral ischemia History of murmur with mild AS on TTE this admission   Assessment & Plan    1. Chest pain:  Post DES to OM with staged orbital atherectomy to RCA planned ? Thursday or Friday will Discuss with Dr Ellyn Hack DAT Given hypotension with first case and need for RCA intervention will not Be too aggressive with BP lowering until after 2 nd procedure  2. AS:  Mild by TTE done this am mean gradient 14 mmHg peak 24 mmHg   Can probably be d/c in am if no complications from procedure       For questions or updates, please contact [place] Please consult [email or web address] for contact info under        Signed, Jenkins Rouge, MD  09/24/2018, 8:18 AM

## 2018-09-24 NOTE — Progress Notes (Signed)
[facility] PROGRESS NOTE  SCOTTLYN MCHANEY YTK:[id number] DOB: 1931/05/10 DOA: 09/19/2018  PCP: Camille Bal, PA-C  Brief History/Interval Summary: 84 year old Caucasian female with a past medical history significant for breast cancer status post mastectomy, hyperlipidemia, CVA, paroxysmal atrial fibrillation, left pleural effusion, benign lung nodule, thoracic aortic aneurysm presented with complaints of chest pain or shortness of breath.  She was hospitalized for further evaluation.  Seen by cardiology and underwent cardiac catheterization.  She will require two-stage procedure for PCI.  Reason for Visit: Coronary artery disease  Consultants: Cardiology  Procedures:  Cardiac catheterization 10/1  Conclusion     The left ventricular systolic function is normal. Ejection Fraction is 55-65% by visual estimate.  LV end diastolic pressure is normal.  There is mild aortic valve stenosis.  ______________________________________________________________________  Lesion #1: Ost 2nd Mrg to 2nd Mrg lesion is 85% stenosed.  A drug-eluting stent was successfully placed using a STENT SIERRA 2.50 X 18 MM.  Post intervention, there is a a focal area of 5% residual stenosis.  Dist Cx lesion is 45% stenosed.  ______________________________________________________________________  Lesion segment #2-3: Ost RCA lesion is 70% stenosed. Prox RCA to Mid RCA lesion is 70% stenosed. Mid RCA-1 lesion is 95% stenosed. Mid RCA-2 lesion is 50% stenosed.  Dist RCA lesion is 30% stenosed. Ost RPDA lesion is 30% stenosed.   Severe two-vessel disease: Proximal 2nd OM 85%, extensive calcified RCA disease -near ostial calcified 70%, tubular proximal 70%, mid 95%. 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 IV hydration and monitor renal function Tentatively plan for staged orbital atherectomy (CSI)-PCI of RCA on either Thursday, October 3 or Friday, September 24, 2018. Patient was significant hypertensive during the case, however was normotensive post PCI  Recommend uninterrupted dual antiplatelet therapy with Aspirin 81mg  daily and Clopidogrel 75mg  daily for a minimum of 12 months (ACS - Class I recommendation).  Would be okay to stop aspirin after 6 months if necessary for bleeding.  With extensive PCI of the RCA pending, may consider continuing Plavix beyond 12 months to 24 months.    Transthoracic echocardiogram 10/1 Study Conclusions  - Left ventricle: The cavity size was normal. Wall thickness was   normal. Systolic function was normal. The estimated ejection   fraction was in the range of 60% to 65%. Doppler parameters are   consistent with abnormal left ventricular relaxation (grade 1   diastolic dysfunction). Doppler parameters are consistent with   elevated ventricular end-diastolic filling pressure. - Aortic valve: There was mild stenosis. Valve area (VTI): 1.14   cm^2. Valve area (Vmax): 1.02 cm^2. Valve area (Vmean): 1.11   cm^2. - Mitral valve: Calcified annulus. - Left atrium: The atrium was mildly dilated. - Atrial septum: There was increased thickness of the septum,   consistent with lipomatous hypertrophy. No defect or patent   foramen ovale was identified.  Antibiotics: None  Subjective/Interval History: Patient feels well.  Tells me that her procedure is planned for 12 PM.  Feeling a little bit anxious.  But denies any chest pain or shortness of breath.    ROS: Denies any headaches.  Objective:  Vital Signs  Vitals:   09/23/18 1800 09/23/18 1959 09/23/18 2304 09/24/18 0430  BP:  (!) 142/77 138/80 Marland Kitchen)  169/73  Pulse: 93 97 86 89  Resp: 19 15 15  (!) 21  Temp:  98.2 F (36.8 C)  98.2 F (36.8 C) 98.1 F (36.7 C)  TempSrc:  Oral Oral Oral  SpO2: 96% 95% 96% 97%  Weight:      Height:        Intake/Output Summary (Last 24 hours) at 09/24/2018 0758 Last data filed at 09/24/2018 0404 Gross per 24 hour  Intake 1776.54 ml  Output 1500 ml  Net 276.54 ml   Filed Weights   09/19/18 0917 09/21/18 0403  Weight: 63 kg 61.5 kg    General appearance: Awake alert.  In no distress Resp: Normal effort at rest.  Clear to auscultation bilaterally. Cardio: S1-S2 is normal regular.  No S3-S4.  Systolic murmur appreciated over the precordium GI: Abdomen is soft.  Nontender nondistended.  Bowel sounds are present normal. Extremities: No edema Neurologic: Alert and oriented x3.  No focal neurological deficits  Lab Results:  Data Reviewed: I have personally reviewed following labs and imaging studies  CBC: Recent Labs  Lab 09/20/18 0705 09/21/18 0642 09/22/18 0215 09/23/18 0235 09/24/18 0300  WBC 4.3 4.8 5.4 5.4 5.9  HGB 12.8 12.4 12.0 12.5 12.1  HCT 39.8 38.5 37.9 37.8 37.8  MCV 94.1 93.2 93.8 93.1 93.3  PLT 218 227 213 218 284    Basic Metabolic Panel: Recent Labs  Lab 09/20/18 0705 09/21/18 0726 09/22/18 0215 09/23/18 0235 09/24/18 0300  NA 141 140 138 139 139  K 4.1 4.1 3.8 4.1 3.9  CL 111 111 107 109 106  CO2 25 20* 23 23 24   GLUCOSE 92 88 98 110* 108*  BUN 15 14 11 13 9   CREATININE 1.17* 1.19* 1.32* 1.33* 1.16*  CALCIUM 8.3* 8.4* 8.4* 8.7* 8.4*    GFR: Estimated Creatinine Clearance: 31.2 mL/min (A) (by C-G formula based on SCr of 1.16 mg/dL (H)).  Cardiac Enzymes: Recent Labs  Lab 09/19/18 [place] <0.03     Radiology Studies: No results found.   Medications:  Scheduled: . aspirin EC  81 mg Oral Daily  . clopidogrel  75 mg Oral Q breakfast  . docusate sodium  100 mg Oral QPM  . heparin  5,000 Units Subcutaneous Q8H  . pravastatin  20 mg Oral QPM  . sodium chloride flush  3 mL Intravenous Q12H  . sodium chloride flush   3 mL Intravenous Q12H  . vitamin B-12  1,000 mcg Oral Daily   Continuous: . sodium chloride    . sodium chloride    . sodium chloride 1 mL/kg/hr (09/24/18 0511)   XLK:GMWNUU chloride, sodium chloride, morphine injection, nitroGLYCERIN, ondansetron (ZOFRAN) IV, sodium chloride flush, sodium chloride flush  Assessment/Plan:    Angina/coronary artery disease Patient with known history of coronary artery disease based on cardiac CT.  She underwent stress test which was high risk.  Subsequently underwent cardiac catheterization.  Drug-eluting stent was placed in the second OM.  Significant stenosis also noted in the RCA.  Plan is for atherectomy today. Patient is on aspirin Plavix.  Patient on statin.  Cardiology is following.  Not noted to be on beta-blocker. Apparently could not tolerate beta-blocker or ACE inhibitor in the past due to fatigue.  Patient remains stable from a cardiac standpoint.  Await cardiac catheterization today.  If she remains stable she could potentially go home tomorrow.  Elevated blood pressure Has not tolerated beta-blocker or ACE inhibitor in the past.  Blood pressure noted to be  fluctuating.  Elevated levels likely due to anxiety.  Acute kidney injury She likely has chronic kidney disease stage III.  Mild increase in creatinine was noted.  Patient was gently hydrated with improvement this morning.  Continue to monitor urine output.    Elevated d-dimer Unclear significance considering abnormal stress test and abnormal cardiac catheterization.  No further work-up at this time.  Chronic combined systolic and diastolic CHF Seems to be well compensated.  Continue to monitor closely.  Echocardiogram showed normal systolic function.  History of paroxysmal atrial fibrillation Not on anticoagulation currently.  Apparently was on anticoagulation previously.  Will defer this to outpatient providers and cardiology.  Thoracic aortic aneurysm Followed by cardiothoracic  surgery in the outpatient setting.  History of stroke Continue antiplatelet agents and statins.  Chronic lung nodule Noted to be benign on biopsy.  Outpatient surveillance.  Dysphagia, unspecified Apparently has difficulty swallowing at baseline.  Not an active issue currently.  Outpatient follow-up with PCP.  DVT Prophylaxis: Subcutaneous heparin    Code Status: Full code Family Communication: Discussed with the patient Disposition Plan: Management as outlined above.  Cardiac catheterization today.  Anticipate discharge tomorrow.    LOS: 3 days   [facility] [facility] Pager [phone] 09/24/2018, 7:58 AM  If 7PM-7AM, please contact night-coverage at [email or web address], password [facility]

## 2018-09-24 NOTE — Interval H&P Note (Signed)
History and Physical Interval Note:  09/24/2018 1:21 PM  Leslie Carr  has presented today for surgery, with the diagnosis of known RCA CAD with planned staged PCI the various methods of treatment have been discussed with the patient and family. After consideration of risks, benefits and other options for treatment, the patient has consented to  Procedure(s): CORONARY ATHERECTOMY (N/A) as a surgical intervention .    The patient's history has been reviewed, patient examined, no change in status, stable for surgery.  I have reviewed the patient's chart and labs.  Questions were answered to the patient's satisfaction.    See previous procedure for appropriate use.    Glenetta Hew

## 2018-09-25 DIAGNOSIS — I2510 Atherosclerotic heart disease of native coronary artery without angina pectoris: Secondary | ICD-10-CM

## 2018-09-25 DIAGNOSIS — I712 Thoracic aortic aneurysm, without rupture: Secondary | ICD-10-CM

## 2018-09-25 LAB — CBC
HCT: 34.1 % — ABNORMAL LOW (ref 36.0–46.0)
HEMOGLOBIN: 10.6 g/dL — AB (ref 12.0–15.0)
MCH: 29.8 pg (ref 26.0–34.0)
MCHC: 31.1 g/dL (ref 30.0–36.0)
MCV: 95.8 fL (ref 78.0–100.0)
PLATELETS: 213 10*3/uL (ref 150–400)
RBC: 3.56 MIL/uL — ABNORMAL LOW (ref 3.87–5.11)
RDW: 13.2 % (ref 11.5–15.5)
WBC: 6.1 10*3/uL (ref 4.0–10.5)

## 2018-09-25 LAB — BASIC METABOLIC PANEL
Anion gap: 10 (ref 5–15)
BUN: 8 mg/dL (ref 8–23)
CHLORIDE: 109 mmol/L (ref 98–111)
CO2: 19 mmol/L — ABNORMAL LOW (ref 22–32)
CREATININE: 1.11 mg/dL — AB (ref 0.44–1.00)
Calcium: 7.8 mg/dL — ABNORMAL LOW (ref 8.9–10.3)
GFR calc Af Amer: 51 mL/min — ABNORMAL LOW (ref >60)
GFR calc non Af Amer: 44 mL/min — ABNORMAL LOW (ref >60)
GLUCOSE: 104 mg/dL — AB (ref 70–99)
Potassium: 3.7 mmol/L (ref 3.5–5.1)
SODIUM: 138 mmol/L (ref 135–145)

## 2018-09-25 MED ORDER — CLOPIDOGREL BISULFATE 75 MG PO TABS: 75.0000 mg | ORAL_TABLET | Freq: Every day | ORAL | 2 refills | Status: DC

## 2018-09-25 NOTE — Progress Notes (Signed)
Progress Note  Patient Name: Leslie Carr Date of Encounter: 09/25/2018  Primary Cardiologist: Sinclair Grooms, MD   Subjective   Overall doing well with no chest pain, no shortness of breath, no bleeding  Inpatient Medications    Scheduled Meds: . aspirin EC  81 mg Oral Daily  . clopidogrel  75 mg Oral Q breakfast  . docusate sodium  100 mg Oral QPM  . heparin  5,000 Units Subcutaneous Q8H  . pravastatin  20 mg Oral QPM  . sodium chloride flush  3 mL Intravenous Q12H  . sodium chloride flush  3 mL Intravenous Q12H  . vitamin B-12  1,000 mcg Oral Daily   Continuous Infusions: . sodium chloride    . sodium chloride     PRN Meds: sodium chloride, sodium chloride, acetaminophen, morphine injection, nitroGLYCERIN, ondansetron (ZOFRAN) IV, sodium chloride flush, sodium chloride flush   Vital Signs    Vitals:   09/24/18 2310 09/25/18 0236 09/25/18 0400 09/25/18 0714  BP: 131/61 (!) 124/39 (!) 94/48 121/63  Pulse:  92 (!) 103 93  Resp: 18 18 16 17   Temp:  98.1 F (36.7 C)  98.3 F (36.8 C)  TempSrc:  Oral    SpO2: 99% 96% 98% 97%  Weight:      Height:        Intake/Output Summary (Last 24 hours) at 09/25/2018 0831 Last data filed at 09/24/2018 2300 Gross per 24 hour  Intake 671.25 ml  Output 800 ml  Net -128.75 ml   Filed Weights   09/19/18 0917 09/21/18 0403  Weight: 63 kg 61.5 kg    Telemetry    No adverse arrhythmias- Personally Reviewed  ECG    No significant changes from prior, sinus rhythm nonspecific ST-T wave changes on 09/25/2018 at 2:44 AM- Personally Reviewed  Physical Exam   GEN: No acute distress.  Elderly Neck: No JVD Cardiac: RRR, 2/6 systolic right upper sternal border, no rubs, or gallops.  Cath site normal Respiratory: Clear to auscultation bilaterally. GI: Soft, nontender, non-distended  MS: No edema; No deformity. Neuro:  Nonfocal  Psych: Normal affect   Labs    Chemistry Recent Labs  Lab 09/23/18 0235 09/24/18 0300  09/25/18 0232  NA 139 139 138  K 4.1 3.9 3.7  CL 109 106 109  CO2 23 24 19*  GLUCOSE 110* 108* 104*  BUN 13 9 8   CREATININE 1.33* 1.16* 1.11*  CALCIUM 8.7* 8.4* 7.8*  GFRNONAA 36* 42* 44*  GFRAA 41* 49* 51*  ANIONGAP 7 9 10      Hematology Recent Labs  Lab 09/23/18 0235 09/24/18 0300 09/25/18 0232  WBC 5.4 5.9 6.1  RBC 4.06 4.05 3.56*  HGB 12.5 12.1 10.6*  HCT 37.8 37.8 34.1*  MCV 93.1 93.3 95.8  MCH 30.8 29.9 29.8  MCHC 33.1 32.0 31.1  RDW 13.2 13.2 13.2  PLT 218 226 213    Cardiac Enzymes Recent Labs  Lab 09/19/18 1835  TROPONINI <0.03    Recent Labs  Lab 09/19/18 0930 09/19/18 1445  TROPIPOC 0.00 0.01     BNPNo results for input(s): BNP, PROBNP in the last 168 hours.   DDimer  Recent Labs  Lab 09/19/18 1126  DDIMER 1.68*     Radiology    No results found.  Cardiac Studies   ECHO 09/21/18 - Left ventricle: The cavity size was normal. Wall thickness was   normal. Systolic function was normal. The estimated ejection   fraction was in the  range of 60% to 65%. Doppler parameters are   consistent with abnormal left ventricular relaxation (grade 1   diastolic dysfunction). Doppler parameters are consistent with   elevated ventricular end-diastolic filling pressure. - Aortic valve: There was mild stenosis. Valve area (VTI): 1.14   cm^2. Valve area (Vmax): 1.02 cm^2. Valve area (Vmean): 1.11   cm^2. - Mitral valve: Calcified annulus. - Left atrium: The atrium was mildly dilated. - Atrial septum: There was increased thickness of the septum,   consistent with lipomatous hypertrophy. No defect or patent   foramen ovale was identified.   Cath 10/1 and 09/24/18:  Ost RCA lesion is 70% stenosed.  Prox RCA to Mid RCA lesion is 70% stenosed.  Mid RCA lesion is 95% stenosed.  Mid RCA to Dist RCA lesion is 70% stenosed -noted to be worse with distal plaque shift after the initial stent placed in the mid vessel.  ------------------  Initially 2  stents, followed by a third distal stent were deployed in overlapping fashion:  A stent was successfully placed using a STENT SIERRA 2.75 X 28 MM. Postdilated 2.8-2.9 mm with flare in the ostium.  A drug-eluting stent was successfully placed using a STENT SIERRA 2.50 X 33 MM. Postdilated to 2.8 mm  A drug-eluting stent was successfully placed using a STENT SIERRA 2.25 X 12 MM. Tapered post dilation 2.6-2.4 mm  Post intervention, there is a 0% residual stenosis. With all 4 lesions being covered.   Successful orbital atherectomy based PCI of the ostial through mid RCA with 3 overlapping Xience Sierra DES stents. (2.75 mm x 28 mm, 2.5 mm x 33 mm, 2.25 mm x 12 mm)  Recommendation: Transfer to 6 Central post procedure unit for post PCI care and sheath removal. Continue cardiac risk factor modification. Expected discharge tomorrow.  Recommend uninterrupted dual antiplatelet therapy with Aspirin 81mg  daily and Clopidogrel 75mg  daily for a minimum of 12 months (ACS - Class I recommendation). -->  Okay to stop aspirin after 6 months.  Would continue Clopidogrel long-term (greater than 2 years), but okay to hold for procedures after 12 months.  Patient Profile     84 y.o. female with chest pain, high risk Myoview, large area of anterolateral ischemia with mild aortic stenosis who underwent atherectomy of right coronary artery with 3 overlapping DES placement on 09/24/2018.  Assessment & Plan    Coronary artery disease status post atherectomy to RCA -3 DES placed. -Dual antiplatelet therapy.  Consider discontinuation of aspirin in 6 months with long-term continuation of Plavix for up to 2 years per Dr. Ellyn Hack. -Cardiac rehab  Mild aortic stenosis -Should be of no clinical consequence at this time.  Murmur heard on exam.  History of paroxysmal atrial fibrillation with rapid ventricular response - Previously seen by Dr. Tamala Julian during hospitalization in 2017, [facility].  Xarelto was started for  anticoagulation but she did require transfusion due to blood loss anemia.  At clinic follow-up on 05/08/2016 seen by Richardson Dopp, PA she was maintaining sinus rhythm and could not tolerate amiodarone due to significant nausea.  It was recommended at this time that she should remain on Xarelto 15 mg daily given her decreased creatinine clearance.  She has not been seen by cardiology since.  In review of Dr. Leonarda Salon notes secondary to lung nodule ascending aortic dilated root and recurrent left pleural effusion Xarelto dropped off of her list between July 2017 and October 2017 without explanation in our record system.  When I asked her about  this, her taking Xarelto she says I cannot take that.  She refuses to restart this medication.  She understands the risks of potential stroke and takes full responsibility for this.  She states that sometimes the medicines we take can try to hurt you.  Thankfully, she is currently in sinus rhythm.  Aortic atherosclerosis - Continue with statin therapy.   Ascending aortic aneurysm 4.5 cm -Followed by Dr. Roxan Hockey last seen on 05/11/2018.  Okay for discharge from my perspective.  Follow-up with Dr. Tamala Julian or APP team in clinic.  [facility] will sign off.   Medication Recommendations:  As above Other recommendations (labs, testing, etc):  none Follow up as an outpatient:  2-4 weeks Dr. Tamala Julian or APP  For questions or updates, please contact [facility] Please consult [email or web address] for contact info under        Signed, Candee Furbish, MD  09/25/2018, 8:31 AM

## 2018-09-25 NOTE — Discharge Instructions (Signed)
Coronary Angiogram With Stent, Care After °This sheet gives you information about how to care for yourself after your procedure. Your health care provider may also give you more specific instructions. If you have problems or questions, contact your health care provider. °What can I expect after the procedure? °After your procedure, it is common to have: °· Bruising in the area where a small, thin tube (catheter) was inserted. This usually fades within 1-2 weeks. °· Blood collecting in the tissue (hematoma) that may be painful to the touch. It should usually decrease in size and tenderness within 1-2 weeks. ° °Follow these instructions at home: °Insertion area care °· Do not take baths, swim, or use a hot tub until your health care provider approves. °· You may shower 24-48 hours after the procedure or as directed by your health care provider. °· Follow instructions from your health care provider about how to take care of your incision. Make sure you: °? Wash your hands with soap and water before you change your bandage (dressing). If soap and water are not available, use hand sanitizer. °? Change your dressing as told by your health care provider. °? Leave stitches (sutures), skin glue, or adhesive strips in place. These skin closures may need to stay in place for 2 weeks or longer. If adhesive strip edges start to loosen and curl up, you may trim the loose edges. Do not remove adhesive strips completely unless your health care provider tells you to do that. °· Remove the bandage (dressing) and gently wash the catheter insertion site with plain soap and water. °· Pat the area dry with a clean towel. Do not rub the area, because that may cause bleeding. °· Do not apply powder or lotion to the incision area. °· Check your incision area every day for signs of infection. Check for: °? More redness, swelling, or pain. °? More fluid or blood. °? Warmth. °? Pus or a bad smell. °Activity °· Do not drive for 24 hours if you  were given a medicine to help you relax (sedative). °· Do not lift anything that is heavier than 10 lb (4.5 kg) for 5 days after your procedure or as directed by your health care provider. °· Ask your health care provider when it is okay for you: °? To return to work or school. °? To resume usual physical activities or sports. °? To resume sexual activity. °Eating and drinking °· Eat a heart-healthy diet. This should include plenty of fresh fruits and vegetables. °· Avoid the following types of food: °? Food that is high in salt. °? Canned or highly processed food. °? Food that is high in saturated fat or sugar. °? Fried food. °· Limit alcohol intake to no more than 1 drink a day for non-pregnant women and 2 drinks a day for men. One drink equals 12 oz of beer, 5 oz of wine, or 1½ oz of hard liquor. °Lifestyle °· Do not use any products that contain nicotine or tobacco, such as cigarettes and e-cigarettes. If you need help quitting, ask your health care provider. °· Take steps to manage and control your weight. °· Get regular exercise. °· Manage your blood pressure. °· Manage other health problems, such as diabetes. °General instructions °· Take over-the-counter and prescription medicines only as told by your health care provider. Blood thinners may be prescribed after your procedure to improve blood flow through the stent. °· If you need an MRI after your heart stent has been placed, be   sure to tell the health care provider who orders the MRI that you have a heart stent. °· Keep all follow-up visits as directed by your health care provider. This is important. °Contact a health care provider if: °· You have a fever. °· You have chills. °· You have increased bleeding from the catheter insertion area. Hold pressure on the area. °Get help right away if: °· You develop chest pain or shortness of breath. °· You feel faint or you pass out. °· You have unusual pain at the catheter insertion area. °· You have redness,  warmth, or swelling at the catheter insertion area. °· You have drainage (other than a small amount of blood on the dressing) from the catheter insertion area. °· The catheter insertion area is bleeding, and the bleeding does not stop after 30 minutes of holding steady pressure on the area. °· You develop bleeding from any other place, such as from your rectum. There may be bright red blood in your urine or stool, or it may appear as black, tarry stool. °This information is not intended to replace advice given to you by your health care provider. Make sure you discuss any questions you have with your health care provider. °Document Released: 06/27/2005 Document Revised: 09/04/2016 Document Reviewed: 09/04/2016 °Elsevier Interactive Patient Education © 2018 [employer] ° °

## 2018-09-25 NOTE — Progress Notes (Signed)
CARDIAC REHAB PHASE I   PRE:  Rate/Rhythm: 86  BP:       SaO2: 99ra  MODE:  Ambulation: 150 ft   POST:  Rate/Rhythm: 118  BP:       SaO2: 96ra 9:43a-10:20a Patient ambulated with x2 assist and walker. Complained of dizziness that resolved when she got back to her chair and sat down. Patient needed one rest break. Call bell in reach.  [place], MS 09/25/2018 10:16 AM

## 2018-09-25 NOTE — Discharge Summary (Signed)
[facility]  Physician Discharge Summary   Patient ID: Leslie Carr MRN: [id number] DOB/AGE: Jan 14, 1931 84 y.o.  Admit date: 09/19/2018 Discharge date: 09/25/2018  PCP: Camille Bal, PA-C  DISCHARGE DIAGNOSES:  Coronary artery disease Unstable angina status post staged PCI  RECOMMENDATIONS FOR OUTPATIENT FOLLOW UP: 1. Outpatient follow-up with cardiology   DISCHARGE CONDITION: fair  Diet recommendation: As before  [facility] Weights   09/19/18 0917 09/21/18 0403  Weight: 63 kg 61.5 kg    INITIAL HISTORY: 84 year old Caucasian female with a past medical history significant for breast cancer status post mastectomy, hyperlipidemia, CVA, paroxysmal atrial fibrillation, left pleural effusion, benign lung nodule, thoracic aortic aneurysm presented with complaints of chest pain or shortness of breath.  She was hospitalized for further evaluation.  Seen by cardiology and underwent cardiac catheterization.  She will require two-stage procedure for PCI.  Consultants: Cardiology  Procedures:  Cardiac catheterization 10/1  Conclusion     The left ventricular systolic function is normal. Ejection Fraction is 55-65% by visual estimate.  LV end diastolic pressure is normal.  There is mild aortic valve stenosis.  ______________________________________________________________________  Lesion #1: Ost 2nd Mrg to 2nd Mrg lesion is 85% stenosed.  A drug-eluting stent was successfully placed using a STENT SIERRA 2.50 X 18 MM.  Post intervention, there is a a focal area of 5% residual stenosis.  Dist Cx lesion is 45% stenosed.  ______________________________________________________________________  Lesion segment #2-3: Ost RCA lesion is 70% stenosed. Prox RCA to Mid RCA lesion is 70% stenosed. Mid RCA-1 lesion is 95% stenosed. Mid RCA-2 lesion is 50% stenosed.  Dist RCA lesion is 30% stenosed. Ost RPDA lesion is 30% stenosed.  Severe two-vessel disease:  Proximal 2nd OM 85%, extensive calcified RCA disease -near ostial calcified 70%, tubular proximal 70%, mid 95%. 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 IV hydration and monitor renal function Tentatively plan for staged orbital atherectomy (CSI)-PCI of RCA on either Thursday, October 3 or Friday, September 24, 2018. Patient was significant hypertensive during the case, however was normotensive post PCI  Recommend uninterrupted dual antiplatelet therapy with Aspirin 81mg  daily and Clopidogrel 75mg  daily for a minimum of 12 months (ACS - Class I recommendation).Would be okay to stop aspirin after 6 months if necessary for bleeding. With extensive PCI of the RCA pending, may consider continuing Plavix beyond 12 months to 24 months.    Transthoracic echocardiogram 10/1 Study Conclusions  - Left ventricle: The cavity size was normal. Wall thickness was normal. Systolic function was normal. The estimated ejection fraction was in the range of 60% to 65%. Doppler parameters are consistent with abnormal left ventricular relaxation (grade 1 diastolic dysfunction). Doppler parameters are consistent with elevated ventricular end-diastolic filling pressure. - Aortic valve: There was mild stenosis. Valve area (VTI): 1.14 cm^2. Valve area (Vmax): 1.02 cm^2. Valve area (Vmean): 1.11 cm^2. - Mitral valve: Calcified annulus. - Left atrium: The atrium was mildly dilated. - Atrial septum: There was increased thickness of the septum, consistent with lipomatous hypertrophy. No defect or patent foramen ovale was identified.   Cardiac Cath 10/4 Conclusion     Ost RCA lesion is 70% stenosed.  Prox RCA to Mid RCA  lesion is 70% stenosed.  Mid RCA lesion is 95%  stenosed.  Mid RCA to Dist RCA lesion is 70% stenosed -noted to be worse with distal plaque shift after the initial stent placed in the mid vessel.  ------------------  Initially 2 stents, followed by a third distal stent were deployed in overlapping fashion:  A stent was successfully placed using a STENT SIERRA 2.75 X 28 MM. Postdilated 2.8-2.9 mm with flare in the ostium.  A drug-eluting stent was successfully placed using a STENT SIERRA 2.50 X 33 MM. Postdilated to 2.8 mm  A drug-eluting stent was successfully placed using a STENT SIERRA 2.25 X 12 MM. Tapered post dilation 2.6-2.4 mm  Post intervention, there is a 0% residual stenosis. With all 4 lesions being covered.   Successful orbital atherectomy based PCI of the ostial through mid RCA with 3 overlapping Xience Sierra DES stents. (2.75 mm x 28 mm, 2.5 mm x 33 mm, 2.25 mm x 12 mm)  Recommendation: Transfer to 6 Central post procedure unit for post PCI care and sheath removal. Continue cardiac risk factor modification. Expected discharge tomorrow.  Recommend uninterrupted dual antiplatelet therapy with Aspirin 81mg  daily and Clopidogrel 75mg  daily for a minimum of 12 months (ACS - Class I recommendation). -->  Okay to stop aspirin after 6 months.  Would continue Clopidogrel long-term (greater than 2 years), but okay to hold for procedures after 12 months.     HOSPITAL COURSE:   Unstable Angina/coronary artery disease Patient with known history of coronary artery disease based on cardiac CT.  She underwent stress test which was high risk.  Subsequently underwent cardiac catheterization.  Drug-eluting stent was placed in the second OM.  Significant stenosis also noted in the RCA.  She had a 2 stage procedure.  Atherectomy was performed of the mid RCA with placement of 3 drug-eluting stents.  Patient is on dual antiplatelet treatment with aspirin and Plavix which is to be continued..   Patient on statin.  Not noted to be on beta-blocker. Apparently could not tolerate beta-blocker or ACE inhibitor in the past due to fatigue.  Patient remains stable from a cardiac standpoint.    Seen by cardiology this morning.  Cleared for discharge home.  Elevated blood pressure Has not tolerated beta-blocker or ACE inhibitor in the past.  Blood pressure noted to be fluctuating.  Elevated levels likely due to anxiety.  Acute kidney injury She likely has chronic kidney disease stage III.  Mild increase in creatinine was noted.  Patient was gently hydrated with improvement.  Elevated d-dimer Unclear significance considering abnormal stress test and abnormal cardiac catheterization.  No further work-up at this time.  Chronic combined systolic and diastolic CHF Seems to be well compensated.  Echocardiogram showed normal systolic function.  History of paroxysmal atrial fibrillation Not on anticoagulation currently.  Apparently was on anticoagulation previously.    This was discussed with the patient by cardiology.  She states that she stopped taking Xarelto as she was worried about the side effects.  Risks and benefits explained by cardiology.  Patient continues to refuse anticoagulation.  Thoracic aortic aneurysm Followed by cardiothoracic surgery in the outpatient setting.  History of stroke Continue antiplatelet agents and statins.  Chronic lung nodule Noted to be benign on biopsy.  Outpatient surveillance.  Dysphagia, unspecified Apparently has difficulty swallowing at baseline.  Not an active issue currently.  Outpatient follow-up with PCP.  Overall stable.  Okay for discharge home today.    PERTINENT LABS:  The results of significant diagnostics from this hospitalization (including imaging, microbiology, ancillary  and laboratory) are listed below for reference.    Microbiology: Recent Results (from the past 240 hour(s))  MRSA PCR Screening     Status: None    Collection Time: 09/23/18 11:37 AM  Result Value Ref Range Status   MRSA by PCR NEGATIVE NEGATIVE Final    Comment:        The GeneXpert MRSA Assay (FDA approved for NASAL specimens only), is one component of a comprehensive MRSA colonization surveillance program. It is not intended to diagnose MRSA infection nor to guide or monitor treatment for MRSA infections. Performed at [facility], [place] [address], [place], [place] [address]      Labs: Basic Metabolic Panel: Recent Labs  Lab 09/21/18 0726 09/22/18 0215 09/23/18 0235 09/24/18 0300 09/25/18 0232  NA 140 138 139 139 138  K 4.1 3.8 4.1 3.9 3.7  CL 111 107 109 106 109  CO2 20* 23 23 24  19*  GLUCOSE 88 98 110* 108* 104*  BUN 14 11 13 9 8   CREATININE 1.19* 1.32* 1.33* 1.16* 1.11*  CALCIUM 8.4* 8.4* 8.7* 8.4* 7.8*   CBC: Recent Labs  Lab 09/21/18 0642 09/22/18 0215 09/23/18 0235 09/24/18 0300 09/25/18 0232  WBC 4.8 5.4 5.4 5.9 6.1  HGB 12.4 12.0 12.5 12.1 10.6*  HCT 38.5 37.9 37.8 37.8 34.1*  MCV 93.2 93.8 93.1 93.3 95.8  PLT 227 213 218 226 213   Cardiac Enzymes: Recent Labs  Lab 09/19/18 1835  TROPONINI <0.03    IMAGING STUDIES Dg Chest 2 View  Result Date: 09/19/2018 CLINICAL DATA:  Shortness of breath.  History of breast cancer. EXAM: CHEST - 2 VIEW COMPARISON:  None. FINDINGS: There is a 3 cm masslike region seen just inferior to the minor fissure on the right frontal view. No other definitive nodules or masses. Blunting of the left costophrenic angle could represent pleural thickening versus a tiny effusion. The heart, hila, mediastinum, lungs, and pleura are otherwise unremarkable. Degenerative changes in the left shoulder. IMPRESSION: 3 cm masslike region in the right mid lung could represent a neoplasm versus a rounded infectious/inflammatory process. Recommend a CT scan for further evaluation. Electronically Signed   By: Dorise Bullion III M.D   On: 09/19/2018 10:56   Nm Myocar Multi  W/spect Tamela Oddi Motion / Ef  Result Date: 09/20/2018  There was no ST segment deviation noted during stress.  Defect 1: There is a large defect of severe severity.  Findings consistent with ischemia.  This is a high risk study.  Nuclear stress EF: 63%.  No T wave inversion was noted during stress.  Large size, severe intensity reversible apical and anterolateral perfusion defect suggestive of ischemia. LVEF 63% with normal wall motion. This is a high risk study.    DISCHARGE EXAMINATION: Vitals:   09/24/18 2310 09/25/18 0236 09/25/18 0400 09/25/18 0714  BP: 131/61 (!) 124/39 (!) 94/48 121/63  Pulse:  92 (!) 103 93  Resp: 18 18 16 17   Temp:  98.1 F (36.7 C)  98.3 F (36.8 C)  TempSrc:  Oral    SpO2: 99% 96% 98% 97%  Weight:      Height:       General appearance: alert, cooperative, appears stated age and no distress Resp: clear to auscultation bilaterally Cardio: S1-S2 normal regular.  Systolic murmur appreciated over the precordium. GI: soft, non-tender; bowel sounds normal; no masses,  no organomegaly  DISPOSITION: Home  Discharge Instructions    Amb Referral to Cardiac Rehabilitation  Complete by:  As directed    Diagnosis:   Coronary Stents PTCA     Call MD for:  difficulty breathing, headache or visual disturbances   Complete by:  As directed    Call MD for:  extreme fatigue   Complete by:  As directed    Call MD for:  persistant dizziness or light-headedness   Complete by:  As directed    Call MD for:  persistant nausea and vomiting   Complete by:  As directed    Call MD for:  severe uncontrolled pain   Complete by:  As directed    Call MD for:  temperature >100.4   Complete by:  As directed    Diet - low sodium heart healthy   Complete by:  As directed    Discharge instructions   Complete by:  As directed    Please take your medications as prescribed.  You were cared for by a hospitalist during your hospital stay. If you have any questions about your  discharge medications or the care you received while you were in the hospital after you are discharged, you can call the unit and asked to speak with the hospitalist on call if the hospitalist that took care of you is not available. Once you are discharged, your primary care physician will handle any further medical issues. Please note that NO REFILLS for any discharge medications will be authorized once you are discharged, as it is imperative that you return to your primary care physician (or establish a relationship with a primary care physician if you do not have one) for your aftercare needs so that they can reassess your need for medications and monitor your lab values. If you do not have a primary care physician, you can call [phone] for a physician referral.   Increase activity slowly   Complete by:  As directed         Allergies as of 09/25/2018      Reactions   Lipitor [atorvastatin] Other (See Comments)   Dizzy and make her head feel huge       Medication List    TAKE these medications   aspirin EC 81 MG tablet Take 81 mg by mouth daily.   clopidogrel 75 MG tablet Commonly known as:  PLAVIX Take 1 tablet (75 mg total) by mouth daily with breakfast. Start taking on:  09/26/2018   docusate sodium 100 MG capsule Commonly known as:  COLACE Take 100 mg by mouth every evening.   EMERGEN-C IMMUNE PLUS PO Take 1 packet by mouth daily.   pravastatin 20 MG tablet Commonly known as:  PRAVACHOL Take 20 mg by mouth every evening.   SYSTANE COMPLETE OP Apply to eye.   vitamin B-12 1000 MCG tablet Commonly known as:  CYANOCOBALAMIN Take 1,000 mcg by mouth daily.        Follow-up Information    Belva Crome, MD. Schedule an appointment as soon as possible for a visit in 2 week(s).   Specialty:  Cardiology Contact information: [address] [place] [address] [phone]        Camille Bal, PA-C. Schedule an appointment as soon as  possible for a visit in 1 week(s).   Specialty:  Physician Assistant Contact information: [address] [place] [address] [phone]           TOTAL DISCHARGE TIME: 35 mins  Bonnielee Haff  [facility] Pager [phone]  09/25/2018,  2:32 PM

## 2018-09-27 ENCOUNTER — Encounter (HOSPITAL_COMMUNITY): Payer: Self-pay | Admitting: Cardiology

## 2018-09-29 ENCOUNTER — Telehealth (HOSPITAL_COMMUNITY): Payer: Self-pay

## 2018-09-29 NOTE — Telephone Encounter (Signed)
Patients insurance is active and benefits verified through Medicare A/B - No co-pay, deductible amount of $185.00/$185.00 has been met, no out of pocket, 20% co-insurance, and no pre-authorization is required. Passport/reference [phone]  Patients insurance is active and benefits verified through [employer] - no co-pay, no deductible, no out of pocket, no co-insurance, and no pre-authorization is required. Passport/reference [phone]  Patient will need to be seen in f/u on 10/12/18 based upon Medical review. Patients paperwork in the f/u appt bin.

## 2018-09-30 DIAGNOSIS — Z955 Presence of coronary angioplasty implant and graft: Secondary | ICD-10-CM | POA: Diagnosis not present

## 2018-09-30 DIAGNOSIS — E7800 Pure hypercholesterolemia, unspecified: Secondary | ICD-10-CM | POA: Diagnosis not present

## 2018-09-30 DIAGNOSIS — Z09 Encounter for follow-up examination after completed treatment for conditions other than malignant neoplasm: Secondary | ICD-10-CM | POA: Diagnosis not present

## 2018-09-30 DIAGNOSIS — I25118 Atherosclerotic heart disease of native coronary artery with other forms of angina pectoris: Secondary | ICD-10-CM | POA: Diagnosis not present

## 2018-09-30 DIAGNOSIS — Z8673 Personal history of transient ischemic attack (TIA), and cerebral infarction without residual deficits: Secondary | ICD-10-CM | POA: Diagnosis not present

## 2018-09-30 DIAGNOSIS — I712 Thoracic aortic aneurysm, without rupture: Secondary | ICD-10-CM | POA: Diagnosis not present

## 2018-10-01 ENCOUNTER — Other Ambulatory Visit: Payer: Self-pay | Admitting: Thoracic Surgery (Cardiothoracic Vascular Surgery)

## 2018-10-01 DIAGNOSIS — I712 Thoracic aortic aneurysm, without rupture, unspecified: Secondary | ICD-10-CM

## 2018-10-06 ENCOUNTER — Other Ambulatory Visit: Payer: Self-pay | Admitting: Thoracic Surgery (Cardiothoracic Vascular Surgery)

## 2018-10-06 DIAGNOSIS — I712 Thoracic aortic aneurysm, without rupture, unspecified: Secondary | ICD-10-CM

## 2018-10-11 NOTE — Progress Notes (Signed)
Cardiology Office Note   Date:  10/12/2018   ID:  Leslie Carr, DOB January 07, 1931, MRN [id number]  PCP:  Camille Bal, PA-C  Cardiologist:  Dr. Tamala Julian, MD   Chief Complaint  Patient presents with  . Hospitalization Follow-up   History of Present Illness: Leslie Carr is a 84 y.o. female who presents for post hospital follow up, seen for Dr. Tamala Julian. [name] has a hx of CAD (noted to have coronary calcifications in LAD/RCA on CT 2017), chronic combined CHF (last echo 2017 with EF 48% and G1DD), paroxysmal atrial fibrillation not on anticoagulation (pt refused), AAA, prior CVA, breast cancer with recurrent pleural effusions s/p L pleural decortication 2017 and hyperlipidemia who presented to [facility] with complaints of chest pain and shortness of breath 09/19/18.   Per chart review, she had functional decline since her husbands death in 14-Jul-2023. She reported feeling DOE with walking and climbing a flight of stairs but attributed this to inactivity and deconditioning. In the 2 weeks prior to hospitalization she had two episodes of chest pain which she described as a pressure sensation that would awaken her from sleep. Given repeat occurrences she decided to present to the ED for further evaluation.   She was last seen outpatient by cardiology in 2017 for follow up. At that time, she had multiple complaints regarding medications including hypotension, weakness and nausea and was taken off her amiodarone, beta-blocker and ACEi. It was recommended that she undergo a stress test for risk stratification at some point in the future but was without anginal complaints during that visit. She was lost to follow-up at that time and has never undergone an ischemic evaluation.   In the ED, her trop was found to be negative. EKG showed sinus rhythm with non-specific T wave abnormalities and no STE/D. CXR with 3cm mass in RML (neoplasm vs infection vs inflammation); recommended for CT scan to further  evaluate. LE doppler without DVT. Patient was admitted to medicine service and Cardiology was asked to evaluate for chest pain.  She underwent cardiac cath on 09/21/18 which showed  severe two-vessel disease>>proximal 2nd OM 85%, extensive calcified RCA disease -near ostial calcified 70%, tubular proximal 70%, mid 95% with successful PCI of 2nd OM with Xience DES 2.5 x 18 (2.8 mm) ane occluded Left subclavian artery at the subclavian-axillary junction. Plan was made for mRCA arthrectomy and PCI x 3 on 09/24/18.   She presents today for post hospital follow up. She denies chest pain, SOB, palpitations, LE swelling, orthopnea symptoms, dizziness, presyncopal or syncope symptoms. Her cath site is unremarkable without s/s of hematoma or bleeding. Post cath recommendations were for DAPT with ASA 81 and Plavix 75 for a minimum of 6 months. Per cath note, ok to stop ASA at 6 months and would continue Plavix long term>>>greater than 2 years. Ok to hold for procedures after 12 months. She has declined to participate in cardiac rehabilitation given that she lives about 45 minutes away. She walks daily from her house without anginal symptoms. She reports still feeling fairly fatigued however is getting back into a routine. She is asking about diet recommendations and has been complaint with her medications. Overall, she is doing very well.  Past Medical History:  Diagnosis Date  . Breast cancer ([place])   . CHF (congestive heart failure) ([place])   . Heart murmur   . History of bronchitis   . Hypercholesteremia   . Numbness and tingling in left hand   .  Pleural effusion on left   . Shortness of breath dyspnea    "only going up a flight of stairs"  . Stroke [facility]) 2014   left sided weakness - mainly left hand    Past Surgical History:  Procedure Laterality Date  . ABDOMINAL HYSTERECTOMY  1976  . CATARACT EXTRACTION Right   . COLONOSCOPY    . CORONARY ATHERECTOMY N/A 09/24/2018   Procedure: CORONARY  ATHERECTOMY;  Surgeon: Leonie Man, MD;  Location: [place] [facility];  Service: Cardiovascular;  Laterality: N/A;  . CORONARY STENT INTERVENTION N/A 09/21/2018   Procedure: CORONARY STENT INTERVENTION;  Surgeon: Leonie Man, MD;  Location: [place] [facility];  Service: Cardiovascular;  Laterality: N/A;  . DECORTICATION Left 04/17/2016   Procedure: Visceral Pleural DECORTICATION;  Surgeon: Melrose Nakayama, MD;  Location: [facility];  Service: Thoracic;  Laterality: Left;  . FOOT SURGERY Right    "bone spur removal"  . LEFT HEART CATH AND CORONARY ANGIOGRAPHY N/A 09/21/2018   Procedure: LEFT HEART CATH AND CORONARY ANGIOGRAPHY;  Surgeon: Leonie Man, MD;  Location: [place] [facility];  Service: Cardiovascular;  Laterality: N/A;  . MASTECTOMY Left 1985  . PLEURAL BIOPSY Left 04/17/2016   Procedure:  Left PLEURAL BIOPSY;  Surgeon: Melrose Nakayama, MD;  Location: [facility];  Service: Thoracic;  Laterality: Left;  . PLEURAL EFFUSION DRAINAGE Left 04/17/2016   Procedure: DRAINAGE OF Left PLEURAL EFFUSION;  Surgeon: Melrose Nakayama, MD;  Location: [place];  Service: Thoracic;  Laterality: Left;  . TONSILLECTOMY    . VIDEO ASSISTED THORACOSCOPY Left 04/17/2016   Procedure:  Left VIDEO ASSISTED THORACOSCOPY with Drainage of Pleural Effusion, Pleural Biopsy, Visceral Pleural Decortication and Placement of On-Q pain pump;  Surgeon: Melrose Nakayama, MD;  Location: [facility] OR;  Service: Thoracic;  Laterality: Left;    Current Outpatient Medications  Medication Sig Dispense Refill  . aspirin EC 81 MG tablet Take 81 mg by mouth daily.    . clopidogrel (PLAVIX) 75 MG tablet Take 1 tablet (75 mg total) by mouth daily with breakfast. 30 tablet 2  . docusate sodium (COLACE) 100 MG capsule Take 100 mg by mouth every evening.    . Multiple Vitamins-Minerals (EMERGEN-C IMMUNE PLUS PO) Take 1 packet by mouth daily.    . pravastatin (PRAVACHOL) 40 MG tablet Take 40 mg by mouth daily.    Marland Kitchen Propylene  Glycol (SYSTANE COMPLETE OP) Apply to eye.    . vitamin B-12 (CYANOCOBALAMIN) 1000 MCG tablet Take 1,000 mcg by mouth daily.     No current facility-administered medications for this visit.     Allergies:   Lipitor [atorvastatin]   Social History:  The patient  reports that she has quit smoking. She has never used smokeless tobacco. She reports that she does not drink alcohol or use drugs.   Family History:  The patient's family history is not on file.   ROS:  Please see the history of present illness.   Otherwise, review of systems are positive for none.   All other systems are reviewed and negative.    PHYSICAL EXAM: VS:  BP 134/72   Pulse 80   Ht 5\' 2"  (1.575 m)   Wt 139 lb 12.8 oz (63.4 kg)   BMI 25.57 kg/m  , BMI Body mass index is 25.57 kg/m.  General: Elderly, well developed, well nourished, NAD Skin: Warm, dry, intact  Head: Normocephalic, atraumatic, clear, moist mucus membranes. Neck: Negative for carotid bruits. No  JVD Lungs:Clear to ausculation bilaterally. No wheezes, rales, or rhonchi. Breathing is unlabored. Cardiovascular: RRR with S1 S2. + murmur, No rubs, gallops, or LV heave appreciated. Abdomen: Soft, non-tender, non-distended with normoactive bowel sounds. No obvious abdominal masses. MSK: Strength and tone appear normal for age. 5/5 in all extremities Extremities: No edema. No clubbing or cyanosis. DP/PT pulses 2+ bilaterally Neuro: Alert and oriented. No focal deficits. No facial asymmetry. MAE spontaneously. Psych: Responds to questions appropriately with normal affect.    EKG:  EKG is ordered today. The ekg ordered today demonstrates NSR with HR 80 and evidence of LVH and nonspecific T wave abnormalities, similar to prior tracings    Recent Labs: 09/25/2018: BUN 8; Creatinine, Ser 1.11; Hemoglobin 10.6; Platelets 213; Potassium 3.7; Sodium 138    Lipid Panel    Component Value Date/Time   CHOL 143 09/20/2018 0705   TRIG 85 09/20/2018 0705   HDL  36 (L) 09/20/2018 0705   CHOLHDL 4.0 09/20/2018 0705   VLDL 17 09/20/2018 0705   LDLCALC 90 09/20/2018 0705     Wt Readings from Last 3 Encounters:  10/12/18 139 lb 12.8 oz (63.4 kg)  09/21/18 135 lb 9.3 oz (61.5 kg)  05/11/18 139 lb 3.2 oz (63.1 kg)    Other studies Reviewed: Additional studies/ records that were reviewed today include:  Echocardiogram 2017: Study Conclusions  - Procedure narrative: Transthoracic echocardiography. Technically difficult study. - Left ventricle: The cavity size was normal. There was moderate concentric hypertrophy. Systolic function was normal. The estimated ejection fraction was 48%. Diffuse hypokinesis. Doppler parameters are consistent with abnormal left ventricular relaxation (grade 1 diastolic dysfunction). The E/e&' ratio is >15, suggesting elevated LV filling pressure. - Aortic valve: Poorly visualized. Transvalvular velocity was minimally increased. - Left atrium: The atrium was normal in size. - Pericardium, extracardiac: There was a right pleural effusion. There was a left pleural effusion.  Impressions:  - Technically difficult study. LVEF 48%, diffuse hypokinesis, moderate LVH, disatolic dysfunction, elevated LV filling pressure, bilateral pleural effusions, normal LA size, mildly increased aortic valve velocity.  Echocardiogram 09/21/18: Study Conclusions  - Left ventricle: The cavity size was normal. Wall thickness was   normal. Systolic function was normal. The estimated ejection   fraction was in the range of 60% to 65%. Doppler parameters are   consistent with abnormal left ventricular relaxation (grade 1   diastolic dysfunction). Doppler parameters are consistent with   elevated ventricular end-diastolic filling pressure. - Aortic valve: There was mild stenosis. Valve area (VTI): 1.14   cm^2. Valve area (Vmax): 1.02 cm^2. Valve area (Vmean): 1.11   cm^2. - Mitral valve: Calcified  annulus. - Left atrium: The atrium was mildly dilated. - Atrial septum: There was increased thickness of the septum,   consistent with lipomatous hypertrophy. No defect or patent   foramen ovale was identified.   Cardiac catheterization 09/21/18:   The left ventricular systolic function is normal. Ejection Fraction is 55-65% by visual estimate.  LV end diastolic pressure is normal.  There is mild aortic valve stenosis.  ______________________________________________________________________  Lesion #1: Ost 2nd Mrg to 2nd Mrg lesion is 85% stenosed.  A drug-eluting stent was successfully placed using a STENT SIERRA 2.50 X 18 MM.  Post intervention, there is a a focal area of 5% residual stenosis.  Dist Cx lesion is 45% stenosed.  ______________________________________________________________________  Lesion segment #2-3: Ost RCA lesion is 70% stenosed. Prox RCA to Mid RCA lesion is 70% stenosed. Mid RCA-1 lesion is 95% stenosed.  Mid RCA-2 lesion is 50% stenosed.  Dist RCA lesion is 30% stenosed. Ost RPDA lesion is 30% stenosed.   Severe two-vessel disease: Proximal 2nd OM 85%, extensive calcified RCA disease -near ostial calcified 70%, tubular proximal 70%, mid 95%. 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 IV hydration and monitor renal function Tentatively plan for staged orbital atherectomy (CSI)-PCI of RCA on either Thursday, October 3 or Friday, September 24, 2018. Patient was significant hypertensive during the case, however was normotensive post PCI  Recommend uninterrupted dual antiplatelet therapy with Aspirin 81mg  daily and Clopidogrel 75mg  daily for a minimum of 12 months (ACS - Class I recommendation).   Would be okay to stop aspirin after 6 months if necessary for bleeding.  With extensive PCI of the RCA pending, may consider continuing Plavix beyond 12 months to 24 months.  Coronary arthrectomy 09/24/18:   Ost RCA lesion is 70% stenosed.  Prox RCA to Mid RCA lesion is 70% stenosed.  Mid RCA lesion is 95% stenosed.  Mid RCA to Dist RCA lesion is 70% stenosed -noted to be worse with distal plaque shift after the initial stent placed in the mid vessel.  ------------------  Initially 2 stents, followed by a third distal stent were deployed in overlapping fashion:  A stent was successfully placed using a STENT SIERRA 2.75 X 28 MM. Postdilated 2.8-2.9 mm with flare in the ostium.  A drug-eluting stent was successfully placed using a STENT SIERRA 2.50 X 33 MM. Postdilated to 2.8 mm  A drug-eluting stent was successfully placed using a STENT SIERRA 2.25 X 12 MM. Tapered post dilation 2.6-2.4 mm  Post intervention, there is a 0% residual stenosis. With all 4 lesions being covered.   Successful orbital atherectomy based PCI of the ostial through mid RCA with 3 overlapping Xience Sierra DES stents. (2.75 mm x 28 mm, 2.5 mm x 33 mm, 2.25 mm x 12 mm)  Recommendation: Transfer to 6 Central post procedure unit for post PCI care and sheath removal. Continue cardiac risk factor modification. Expected discharge tomorrow.  Recommend uninterrupted dual antiplatelet therapy with Aspirin 81mg  daily and Clopidogrel 75mg  daily for a minimum of 12 months (ACS - Class I recommendation). -->  Okay to stop aspirin after 6 months.  Would continue Clopidogrel long-term (greater than 2 years), but okay to hold for procedures after 12 months   ASSESSMENT AND PLAN:  1. CAD s/p PCI to 2nd OM (09/21/18) and arthrectomy and PCI x3 to mRCA (09/24/18): -Stable, no chest pain or anginal symptoms  -Reports increasing activity level slowly. Has been walking around her neighborhood, declined cardiac rehab.  -Plan  to continue DAPT for 6 months then long term Plavix for at least 2 years per Dr. Ellyn Hack.  -No s/s of bleeding  -Discussed heart healthy diet including low fat, high in fruits and vegetables>>low sodium including avoiding canned and processed foods and eating out>>encouraged home cooked meals -Continue  ASA, Plavix, statin   2.  Hx of PAF: -Per chart review, she was previously treated with low dose Xarelto which was started during a hospitalization in 2017. At clinic follow-up on 05/08/2016 seen by Richardson Dopp, PA she was maintaining sinus rhythm and could not tolerate amiodarone due to significant nausea.  It was  recommended at this time that she should remain on Xarelto 15 mg daily given her decreased creatinine clearance. She was lost in follow up and somehow Xarelto was dropped off her medication list. During her hospitalization she was asked about this and declined to restart knowing the increased risk of stroke without taking it. She was aware and agreeable to that plan.  -EKG today with NSR HR 80 -Continues to decline AC -CHA2DS2VASc =7  3. Ascending aortic aneurysm: -Followed by Dr. Emilie Rutter, last seen 05/11/18  4. Chronic diastolic HF: -Echocardiogram with LVEF 60-65% and NWMA with G1DD -Euvolemic on exam today  -Continue current CAD regimen>>see #1  5. HLD: -LDL on 09/20/18, 90  -LDL goal <70 -Increased pravastatin to 40mg  daily and will need repeat lipid panel to follow  -See diet plan above   Current medicines are reviewed at length with the patient today.  The patient does not have concerns regarding medicines.  The following changes have been made: Increased pravastatin to 40mg  PO daily given elevated LDL and known CAD   Labs/ tests ordered today include: None  No orders of the defined types were placed in this encounter.  Disposition:  FU with Dr. Tamala Julian in 6 months or sooner if recurrent symptoms   Signed, Kathyrn Drown, NP  10/12/2018 3:33 PM    [address], [place], [place]  [address] Phone: [phone]; Fax: [phone]

## 2018-10-12 ENCOUNTER — Other Ambulatory Visit: Payer: Medicare Other

## 2018-10-12 ENCOUNTER — Ambulatory Visit (INDEPENDENT_AMBULATORY_CARE_PROVIDER_SITE_OTHER): Payer: Medicare Other | Admitting: Cardiology

## 2018-10-12 ENCOUNTER — Encounter: Payer: Self-pay | Admitting: Cardiology

## 2018-10-12 ENCOUNTER — Ambulatory Visit: Payer: Medicare Other | Admitting: Thoracic Surgery (Cardiothoracic Vascular Surgery)

## 2018-10-12 ENCOUNTER — Other Ambulatory Visit: Payer: Self-pay

## 2018-10-12 VITALS — BP 134/72 | HR 80 | Ht 62.0 in | Wt 139.8 lb

## 2018-10-12 DIAGNOSIS — T451X5A Adverse effect of antineoplastic and immunosuppressive drugs, initial encounter: Secondary | ICD-10-CM

## 2018-10-12 DIAGNOSIS — E785 Hyperlipidemia, unspecified: Secondary | ICD-10-CM

## 2018-10-12 DIAGNOSIS — Z9861 Coronary angioplasty status: Secondary | ICD-10-CM

## 2018-10-12 DIAGNOSIS — I10 Essential (primary) hypertension: Secondary | ICD-10-CM

## 2018-10-12 DIAGNOSIS — I2510 Atherosclerotic heart disease of native coronary artery without angina pectoris: Secondary | ICD-10-CM

## 2018-10-12 DIAGNOSIS — Z09 Encounter for follow-up examination after completed treatment for conditions other than malignant neoplasm: Secondary | ICD-10-CM | POA: Diagnosis not present

## 2018-10-12 DIAGNOSIS — I427 Cardiomyopathy due to drug and external agent: Secondary | ICD-10-CM

## 2018-10-12 DIAGNOSIS — M818 Other osteoporosis without current pathological fracture: Secondary | ICD-10-CM

## 2018-10-12 DIAGNOSIS — T386X5A Adverse effect of antigonadotrophins, antiestrogens, antiandrogens, not elsewhere classified, initial encounter: Secondary | ICD-10-CM

## 2018-10-12 DIAGNOSIS — C50912 Malignant neoplasm of unspecified site of left female breast: Secondary | ICD-10-CM

## 2018-10-12 DIAGNOSIS — R899 Unspecified abnormal finding in specimens from other organs, systems and tissues: Secondary | ICD-10-CM

## 2018-10-12 MED ORDER — CLOPIDOGREL BISULFATE 75 MG PO TABS: 75.0000 mg | ORAL_TABLET | Freq: Every day | ORAL | 3 refills | Status: DC

## 2018-10-12 MED ORDER — PRAVASTATIN SODIUM 40 MG PO TABS: 40.0000 mg | ORAL_TABLET | Freq: Every day | ORAL | 3 refills | Status: DC

## 2018-10-12 NOTE — Telephone Encounter (Signed)
Refill for plavix 75 mg.

## 2018-10-12 NOTE — Patient Instructions (Signed)

## 2018-10-12 NOTE — Progress Notes (Signed)
Dr. Ellyn Hack has taken over care for this patient. Follow up should be with him.

## 2018-10-14 ENCOUNTER — Telehealth (HOSPITAL_COMMUNITY): Payer: Self-pay

## 2018-10-14 NOTE — Addendum Note (Signed)
Addended by: Gar Ponto on: 10/14/2018 07:31 AM   Modules accepted: Orders

## 2018-10-14 NOTE — Telephone Encounter (Signed)
Called patient to see if she was interested in participating in the Cardiac Rehab Program. Patient stated not at this time. ° °Closed referral °

## 2018-10-19 NOTE — Addendum Note (Signed)
Addended by: Gar Ponto on: 10/19/2018 07:41 AM   Modules accepted: Orders

## 2018-11-02 ENCOUNTER — Encounter: Payer: Self-pay | Admitting: Thoracic Surgery (Cardiothoracic Vascular Surgery)

## 2018-11-02 ENCOUNTER — Ambulatory Visit (INDEPENDENT_AMBULATORY_CARE_PROVIDER_SITE_OTHER): Payer: Medicare Other | Admitting: Thoracic Surgery (Cardiothoracic Vascular Surgery)

## 2018-11-02 ENCOUNTER — Other Ambulatory Visit: Payer: Self-pay

## 2018-11-02 ENCOUNTER — Ambulatory Visit
Admission: RE | Admit: 2018-11-02 | Discharge: 2018-11-02 | Disposition: A | Discharge status: Home / Self Care | Payer: Medicare Other | Source: Ambulatory Visit | Attending: Thoracic Surgery (Cardiothoracic Vascular Surgery) | Admitting: Thoracic Surgery (Cardiothoracic Vascular Surgery)

## 2018-11-02 VITALS — BP 140/70 | HR 79 | Resp 16 | Ht 62.0 in | Wt 137.0 lb

## 2018-11-02 DIAGNOSIS — R911 Solitary pulmonary nodule: Secondary | ICD-10-CM | POA: Diagnosis not present

## 2018-11-02 DIAGNOSIS — I712 Thoracic aortic aneurysm, without rupture, unspecified: Secondary | ICD-10-CM

## 2018-11-02 DIAGNOSIS — I2510 Atherosclerotic heart disease of native coronary artery without angina pectoris: Secondary | ICD-10-CM | POA: Diagnosis not present

## 2018-11-02 DIAGNOSIS — Z9861 Coronary angioplasty status: Secondary | ICD-10-CM | POA: Diagnosis not present

## 2018-11-02 DIAGNOSIS — I7121 Aneurysm of the ascending aorta, without rupture: Secondary | ICD-10-CM

## 2018-11-02 NOTE — Progress Notes (Signed)
[address]       [place], [address]             [phone]     HPI: Leslie Carr returns for scheduled follow-up visit  Leslie Carr is an 84 year old woman with a past history of breast cancer, left pleural effusion, hypercholesterolemia, stroke with mild left hemiparesis, heart failure, and an ascending aneurysm.  She has severe thoracic aortic atherosclerotic disease.  She had a left decortication for an inflammatory pleural effusion back in April 2017.  She has been followed for a right middle lobe nodule as well as a 4.4 cm ascending aneurysm.  Earlier this year the right middle lobe nodule slightly enlarged.  A CT-guided biopsy showed no evidence of malignancy.  The stains were suspicious for amyloid nodule.  In the interim since her last visit she has had a difficult time.  Her husband passed away in July 10, 2023.  In 09-Nov-2023 she was admitted with chest pain and found to have two-vessel disease involving the right coronary and obtuse marginal.  She had a total of 4 stents placed.  She is on Plavix.  She has not had any additional chest pain since then.  Past Medical History:  Diagnosis Date  . Breast cancer ([place])   . CHF (congestive heart failure) ([place])   . Heart murmur   . History of bronchitis   . Hypercholesteremia   . Numbness and tingling in left hand   . Pleural effusion on left   . Shortness of breath dyspnea    "only going up a flight of stairs"  . Stroke [facility]) 2014   left sided weakness - mainly left hand    Current Outpatient Medications  Medication Sig Dispense Refill  . aspirin EC 81 MG tablet Take 81 mg by mouth daily.    . clopidogrel (PLAVIX) 75 MG tablet Take 1 tablet (75 mg total) by mouth daily with breakfast. 90 tablet 3  . docusate sodium (COLACE) 100 MG capsule Take 100 mg by mouth every evening.    . Multiple Vitamins-Minerals (EMERGEN-C IMMUNE PLUS PO) Take 1 packet by mouth daily.    . pravastatin (PRAVACHOL) 40 MG tablet Take 1  tablet (40 mg total) by mouth daily. 90 tablet 3  . Propylene Glycol (SYSTANE COMPLETE OP) Apply to eye.    . vitamin B-12 (CYANOCOBALAMIN) 1000 MCG tablet Take 1,000 mcg by mouth daily.     No current facility-administered medications for this visit.     Physical Exam BP 140/70 (BP Location: Right Arm, Patient Position: Sitting, Cuff Size: Normal)   Pulse 79   Resp 16   Ht 5\' 2"  (1.575 m)   Wt 137 lb (62.1 kg)   SpO2 95% Comment: RA  BMI 25.36 kg/m  84 year old woman in no acute distress Alert and oriented x3 with no focal deficits Cardiac regular rate and rhythm with a 2/6 systolic murmur Lungs clear with equal breath sounds bilaterally No cervical or supraclavicular adenopathy  Diagnostic Tests: CT CHEST WITHOUT CONTRAST  TECHNIQUE: Multidetector CT imaging of the chest was performed following the standard protocol without IV contrast.  COMPARISON:  Chest CT 04/06/2018 and 10/13/2017.  FINDINGS: Cardiovascular: Again demonstrated is extensive atherosclerosis of the aorta, great vessels and coronary arteries. Dilatation of the ascending aorta to 4.4 cm (coronal image 64/4) is similar to the previous study. Likewise, displaced intimal calcifications with aortic arch are stable. No acute vascular findings are seen on noncontrast imaging. There are  aortic valvular calcifications. The heart size is normal. There is no pericardial effusion.  Mediastinum/Nodes: There are no enlarged mediastinal, hilar or axillary lymph nodes.Hilar assessment is limited by the lack of intravenous contrast, although the hilar contours appear unchanged. Densely calcified left thyroid nodule is stable. The esophagus and trachea appear unremarkable.  Lungs/Pleura: There is no pleural effusion. There is stable mild pleural thickening on the left. There is stable volume loss in the left upper lobe with anterior apical and paramediastinal scarring. The partially calcified lesion posteriorly  in the right middle lobe is not significantly changed, measuring 2.7 x 1.8 cm on image 79/8. There is a stable calcified right lower lobe granuloma. No new or enlarging pulmonary nodules are seen.  Upper abdomen: The visualized upper abdomen appears stable without acute findings. There are calcified small splenic granulomas. There is extensive aortic and branch vessel atherosclerosis.  Musculoskeletal/Chest wall: There is no chest wall mass or suspicious osseous finding. There is an old left-sided rib fracture, degenerative changes throughout the thoracic spine and severe left glenohumeral arthropathy. Probable sequela of previous port in the left anterior subcutaneous soft tissues.  IMPRESSION: 1. The partially calcified right middle lobe lesion has not significantly changed from the most recent study of 04/06/2018. This was biopsied on 04/27/2018, revealing no evidence of malignancy and benign fibroelastotic lesion with calcification. 2. Stable left upper lobe scarring attributed to previous radiation therapy. 3. No new or enlarging nodules or other evidence of metastatic disease. 4. Extensive atherosclerosis with stable ascending aortic aneurysm and displaced intimal calcifications in the aortic arch.   Electronically Signed   By: Richardean Sale M.D.   On: 11/02/2018 13:58 I personally reviewed the CT images and concur with the findings noted above  Impression: Leslie Carr is an 84 year old woman with atherosclerotic cardiovascular disease with thoracic aortic atherosclerosis, coronary disease status post recent stenting, and a 4.4 cm ascending aneurysm.  The aneurysm has been stable over time.  Right middle lobe lung nodule-unchanged on exam today.  Biopsy earlier this year showed benign fibroblastic tissue.  Suspicion for amyloid nodule.   Plan: Return in 1 year with CT of chest.  No IV contrast  Melrose Nakayama, MD [facility] [phone]

## 2018-11-09 ENCOUNTER — Encounter: Payer: Medicare Other | Admitting: Thoracic Surgery (Cardiothoracic Vascular Surgery)

## 2018-11-10 ENCOUNTER — Other Ambulatory Visit: Payer: Medicare Other

## 2019-01-18 DIAGNOSIS — Z955 Presence of coronary angioplasty implant and graft: Secondary | ICD-10-CM | POA: Diagnosis not present

## 2019-01-18 DIAGNOSIS — R8290 Unspecified abnormal findings in urine: Secondary | ICD-10-CM | POA: Diagnosis not present

## 2019-01-18 DIAGNOSIS — I480 Paroxysmal atrial fibrillation: Secondary | ICD-10-CM | POA: Diagnosis not present

## 2019-01-18 DIAGNOSIS — F4321 Adjustment disorder with depressed mood: Secondary | ICD-10-CM | POA: Diagnosis not present

## 2019-01-18 DIAGNOSIS — R42 Dizziness and giddiness: Secondary | ICD-10-CM | POA: Diagnosis not present

## 2019-01-18 DIAGNOSIS — F419 Anxiety disorder, unspecified: Secondary | ICD-10-CM | POA: Diagnosis not present

## 2019-01-24 ENCOUNTER — Encounter: Payer: Self-pay | Admitting: Physician Assistant

## 2019-01-24 NOTE — Progress Notes (Signed)
Cardiology Office Note   Date:  01/25/2019   ID:  Leslie Carr, DOB 12-15-1931, MRN [id number]  PCP:  Camille Bal, PA-C  Cardiologist: Dr. Ellyn Hack, MD   Chief Complaint  Patient presents with  . Follow-up  . Dizziness  . Fatigue   History of Present Illness: Leslie Carr is a 85 y.o. female who presents for dizziness and weakness referred by PCP, seen for Dr. Ellyn Hack.   [name] has a hx of CAD (noted to have coronary calcifications in LAD/RCA on CT 2017), chronic combined CHF (last echo 2017 with EF 48% and G1DD), paroxysmal atrial fibrillationnot on anticoagulation (pt refused), AAA, prior CVA, breast cancer with recurrent pleural effusions s/p L pleural decortication 2017 and hyperlipidemia.  Per chart review, she had functional decline since her husbands death in Jul 04, 2023. She reported feeling DOE with walking and climbing a flight of stairs but attributed this to inactivity and deconditioning. In the 2 weeks prior to hospitalization (08/2018) she had two episodes of chest pain which she described as a pressure sensation that would awaken her from sleep. Given repeat occurrences she decided to present to the ED for further evaluation.She underwent cardiac cath on 09/21/18 which showed  severe two-vessel disease>>proximal 2nd OM 85%, extensive calcified RCA disease -near ostial calcified 70%, tubular proximal 70%, mid 95% with successful PCI of 2nd OM with Xience DES 2.5 x 18 (2.8 mm).  Plan was made for mRCA arthrectomy and PCI x 3 on 09/24/18.   She was last seen in follow-up 10/12/2018. Post cath recommendations were for DAPT with ASA 81 and Plavix 75 for a minimum of 6 months. Per cath note, ok to stop ASA at 6 months and would continue Plavix long term>>>greater than 2 years. Ok to hold for procedures after 12 months.  Overall she was doing well.  Prior to that she was seen by cardiology in 2017 for follow-up. At that time, she had multiple complaints regarding  medications including hypotension, weakness and nausea and was taken off her amiodarone, beta-blocker and ACEi. It was recommended that she undergo a stress test for risk stratification at some point in the future but was without anginal complaints during that visit. She was lost to follow-up at that time and has never undergone an ischemic evaluation.   She presents today for dizziness from her PCP. Pt reports that since the initiation of Plavix, she has noticed increase morning dizziness which dissipates over the course of the day. She reports that it does not have a vertigo, spinning quality but will happen after getting up out of bed in the morning. She reports oral intact which includes mostly iced tea. She denies nausea, vomiting, chest pain, palpitations, SOB, pre-syncope or syncopal episodes. She has recently been treated with antibiotics for a UTI and has had an upper respiratory infection that has included a cough and nasal drainage since December. She reports that she stopped her Plavix for a 2-3 day duration to see if this would help her symptoms. Her family discussed the importance of remaining on the medication and she resumed. She denies SOB or orthopnea symptoms.   Past Medical History:  Diagnosis Date  . Breast cancer ([place])   . CHF (congestive heart failure) ([place])   . Heart murmur   . History of bronchitis   . Hypercholesteremia   . Numbness and tingling in left hand   . Pleural effusion on left   . Shortness of breath dyspnea    "  only going up a flight of stairs"  . Stroke [facility]) 2014   left sided weakness - mainly left hand    Past Surgical History:  Procedure Laterality Date  . ABDOMINAL HYSTERECTOMY  1976  . CATARACT EXTRACTION Right   . COLONOSCOPY    . CORONARY ATHERECTOMY N/A 09/24/2018   Procedure: CORONARY ATHERECTOMY;  Surgeon: Leonie Man, MD;  Location: [place] [facility];  Service: Cardiovascular;  Laterality: N/A;  . CORONARY STENT INTERVENTION N/A  09/21/2018   Procedure: CORONARY STENT INTERVENTION;  Surgeon: Leonie Man, MD;  Location: [place] [facility];  Service: Cardiovascular;  Laterality: N/A;  . DECORTICATION Left 04/17/2016   Procedure: Visceral Pleural DECORTICATION;  Surgeon: Melrose Nakayama, MD;  Location: [facility];  Service: Thoracic;  Laterality: Left;  . FOOT SURGERY Right    "bone spur removal"  . LEFT HEART CATH AND CORONARY ANGIOGRAPHY N/A 09/21/2018   Procedure: LEFT HEART CATH AND CORONARY ANGIOGRAPHY;  Surgeon: Leonie Man, MD;  Location: [place] [facility];  Service: Cardiovascular;  Laterality: N/A;  . MASTECTOMY Left 1985  . PLEURAL BIOPSY Left 04/17/2016   Procedure:  Left PLEURAL BIOPSY;  Surgeon: Melrose Nakayama, MD;  Location: [place] [facility];  Service: Thoracic;  Laterality: Left;  . PLEURAL EFFUSION DRAINAGE Left 04/17/2016   Procedure: DRAINAGE OF Left PLEURAL EFFUSION;  Surgeon: Melrose Nakayama, MD;  Location: [facility];  Service: Thoracic;  Laterality: Left;  . TONSILLECTOMY    . VIDEO ASSISTED THORACOSCOPY Left 04/17/2016   Procedure:  Left VIDEO ASSISTED THORACOSCOPY with Drainage of Pleural Effusion, Pleural Biopsy, Visceral Pleural Decortication and Placement of On-Q pain pump;  Surgeon: Melrose Nakayama, MD;  Location: [facility] OR;  Service: Thoracic;  Laterality: Left;     Current Outpatient Medications  Medication Sig Dispense Refill  . aspirin EC 81 MG tablet Take 81 mg by mouth daily.    . clopidogrel (PLAVIX) 75 MG tablet Take 1 tablet (75 mg total) by mouth daily with breakfast. 90 tablet 3  . docusate sodium (COLACE) 100 MG capsule Take 100 mg by mouth every evening.    . Multiple Vitamins-Minerals (EMERGEN-C IMMUNE PLUS PO) Take 1 packet by mouth daily.    . pravastatin (PRAVACHOL) 40 MG tablet Take 1 tablet (40 mg total) by mouth daily. 90 tablet 3  . Propylene Glycol (SYSTANE COMPLETE OP) Apply to eye.    . vitamin B-12 (CYANOCOBALAMIN) 1000 MCG tablet Take 1,000 mcg by mouth daily.      No current facility-administered medications for this visit.    Allergies:   Lipitor [atorvastatin]   Social History:  The patient  reports that she has quit smoking. She has never used smokeless tobacco. She reports that she does not drink alcohol or use drugs.   Family History:  The patient'sfamily history includes Hypertension in her father and mother.   ROS:  Please see the history of present illness. Otherwise, review of systems are positive for none.   All other systems are reviewed and negative.    PHYSICAL EXAM: VS:  BP 118/70   Pulse 81   Ht 5\' 2"  (1.575 m)   Wt 140 lb 6.4 oz (63.7 kg)   SpO2 98%   BMI 25.68 kg/m  , BMI Body mass index is 25.68 kg/m.   General: Well developed, well nourished, NAD Skin: Warm, dry, intact  Head: Normocephalic, atraumatic, clear, moist mucus membranes. Neck: Negative for carotid bruits. No JVD Lungs: Clear to ausculation bilaterally.  No wheezes, rales, or rhonchi. Breathing is unlabored. Cardiovascular: RRR with S1 S2. No murmurs, rubs, gallops, or LV heave appreciated. Abdomen: Soft, non-tender, non-distended with normoactive bowel sounds. No obvious abdominal masses. MSK: Strength and tone appear normal for age. 5/5 in all extremities Extremities: No edema. No clubbing or cyanosis. DP/PT pulses 2+ bilaterally Neuro: Alert and oriented. No focal deficits. No facial asymmetry. MAE spontaneously. Psych: Responds to questions appropriately with normal affect.    EKG:  EKG is ordered today. The ekg ordered today demonstrates NSR with HR 81  Recent Labs: 09/25/2018: BUN 8; Creatinine, Ser 1.11; Hemoglobin 10.6; Platelets 213; Potassium 3.7; Sodium 138   Lipid Panel    Component Value Date/Time   CHOL 143 09/20/2018 0705   TRIG 85 09/20/2018 0705   HDL 36 (L) 09/20/2018 0705   CHOLHDL 4.0 09/20/2018 0705   VLDL 17 09/20/2018 0705   LDLCALC 90 09/20/2018 0705    Wt Readings from Last 3 Encounters:  01/25/19 140 lb 6.4 oz (63.7  kg)  11/02/18 137 lb (62.1 kg)  10/12/18 139 lb 12.8 oz (63.4 kg)     Other studies Reviewed: Additional studies/ records that were reviewed today include:   Echocardiogram 2017: Study Conclusions  - Procedure narrative: Transthoracic echocardiography. Technically difficult study. - Left ventricle: The cavity size was normal. There was moderate concentric hypertrophy. Systolic function was normal. The estimated ejection fraction was 48%. Diffuse hypokinesis. Doppler parameters are consistent with abnormal left ventricular relaxation (grade 1 diastolic dysfunction). The E/e&' ratio is >15, suggesting elevated LV filling pressure. - Aortic valve: Poorly visualized. Transvalvular velocity was minimally increased. - Left atrium: The atrium was normal in size. - Pericardium, extracardiac: There was a right pleural effusion. There was a left pleural effusion.  Impressions:  - Technically difficult study. LVEF 48%, diffuse hypokinesis, moderate LVH, disatolic dysfunction, elevated LV filling pressure, bilateral pleural effusions, normal LA size, mildly increased aortic valve velocity.  Echocardiogram 09/21/18: Study Conclusions  - Left ventricle: The cavity size was normal. Wall thickness was normal. Systolic function was normal. The estimated ejection fraction was in the range of 60% to 65%. Doppler parameters are consistent with abnormal left ventricular relaxation (grade 1 diastolic dysfunction). Doppler parameters are consistent with elevated ventricular end-diastolic filling pressure. - Aortic valve: There was mild stenosis. Valve area (VTI): 1.14 cm^2. Valve area (Vmax): 1.02 cm^2. Valve area (Vmean): 1.11 cm^2. - Mitral valve: Calcified annulus. - Left atrium: The atrium was mildly dilated. - Atrial septum: There was increased thickness of the septum, consistent with lipomatous hypertrophy. No defect or patent foramen ovale  was identified.   Cardiac catheterization 09/21/18:   The left ventricular systolic function is normal. Ejection Fraction is 55-65% by visual estimate.  LV end diastolic pressure is normal.  There is mild aortic valve stenosis.  ______________________________________________________________________  Lesion #1: Ost 2nd Mrg to 2nd Mrg lesion is 85% stenosed.  A drug-eluting stent was successfully placed using a STENT SIERRA 2.50 X 18 MM.  Post intervention, there is a a focal area of 5% residual stenosis.  Dist Cx lesion is 45% stenosed.  ______________________________________________________________________  Lesion segment #2-3: Ost RCA lesion is 70% stenosed. Prox RCA to Mid RCA lesion is 70% stenosed. Mid RCA-1 lesion is 95% stenosed. Mid RCA-2 lesion is 50% stenosed.  Dist RCA lesion is 30% stenosed. Ost RPDA lesion is 30% stenosed.  Severe two-vessel disease: Proximal 2nd OM 85%, extensive calcified RCA disease -near ostial calcified 70%, tubular proximal 70%, mid 95%. 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 IV hydration and monitor renal function Tentatively plan for staged orbital atherectomy (CSI)-PCI of RCA on either Thursday, October 3 or Friday, September 24, 2018. Patient was significant hypertensive during the case, however was normotensive post PCI  Recommend uninterrupted dual antiplatelet therapy with Aspirin 81mg  daily and Clopidogrel 75mg  daily for a minimum of 12 months (ACS - Class I recommendation).Would be okay to stop aspirin after 6 months if necessary for bleeding. With extensive PCI of the RCA pending, may consider continuing Plavix beyond 12 months to 24 months.  Coronary  arthrectomy 09/24/18:   Ost RCA lesion is 70% stenosed.  Prox RCA to Mid RCA lesion is 70% stenosed.  Mid RCA lesion is 95% stenosed.  Mid RCA to Dist RCA lesion is 70% stenosed -noted to be worse with distal plaque shift after the initial stent placed in the mid vessel.  ------------------  Initially 2 stents, followed by a third distal stent were deployed in overlapping fashion:  A stent was successfully placed using a STENT SIERRA 2.75 X 28 MM. Postdilated 2.8-2.9 mm with flare in the ostium.  A drug-eluting stent was successfully placed using a STENT SIERRA 2.50 X 33 MM. Postdilated to 2.8 mm  A drug-eluting stent was successfully placed using a STENT SIERRA 2.25 X 12 MM. Tapered post dilation 2.6-2.4 mm  Post intervention, there is a 0% residual stenosis. With all 4 lesions being covered.  Successful orbital atherectomy based PCI of the ostial through mid RCA with 3 overlapping Xience Sierra DES stents. (2.75 mm x 28 mm, 2.5 mm x 33 mm, 2.25 mm x 12 mm)  Recommendation: Transfer to 6 Central post procedure unit for post PCI care and sheath removal. Continue cardiac risk factor modification. Expected discharge tomorrow.  Recommend uninterrupted dual antiplatelet therapy with Aspirin 81mg  daily and Clopidogrel 75mg  dailyfor a minimum of 12 months (ACS - Class I recommendation).-->Okay to stop aspirin after 6 months. Would continue Clopidogrel long-term (greater than 2 years), but okay to hold for procedures after 12 months  ASSESSMENT AND PLAN:   1. Dizziness and weakness: -Pt reports morning dizziness since the initiation of Plavix 09/2018 s/p siugnificant stenting. Her symptoms dissipate over the course of the day. She reports no  vertigo or spinning quality but will happen after getting up out of bed in the morning>>appears to be orthostatic in nature. She denies nausea, vomiting, chest pain, palpitations, pre-syncope or syncopal episodes. She has recently been  treated with antibiotics for a UTI and has had an upper respiratory infection that has included a cough and nasal drainage since December. She reports that she stopped her Plavix for a 2-3 day duration to see if this would help her symptoms. We discussed the importance of remaining on Plavix.  -I have encouraged her to increase her daily oral intake with water to at least 6-8 glasses per day -Will check orthostatics today and check BMET and CBC>>Orhtostatics as follows: Lying-138/74 Sitting-131/72 Standing(1 minute)-107/64  Standing (3 minute)-114/67 -Given her recent c/o upper respiratory/head cold, would encourage the addtion of Claritin for at least one month and monitor symptoms -I would like her to take a morning and evening BP readings and record>>>follow with PCP  -If presyncope or syncopal  episode, we will see her back here for further evaluation -If not, follow with PCP in 2-3 weeks with recordings  -Do not suspect that this is related to her Plavix therapy   2. CAD s/p PCI to 2nd OM (09/21/18) and arthrectomy and PCI x3 to mRCA (09/24/18): -Stable, no chest pain or anginal symptoms  -Plan to continue DAPT for 6 months then long term Plavix for at least 2 years per Dr. Ellyn Hack.  -No s/s of bleeding  -Encouraged increased fluids for dizziness  -Continue  ASA, Plavix, statin   3.  Hx of PAF: -During her hospitalization she was asked about this and declined to restart knowing the increased risk of stroke without taking it. She was aware and agreeable to that plan.  -EKG today with NSR HR 81 -Continues to decline AC -CHA2DS2VASc =7  4. Ascending aortic aneurysm: -Followed by Dr. Emilie Rutter, last seen 11/02/2018  5. Chronic diastolic HF: -Echocardiogram with LVEF 60-65% and NWMA with G1DD -Euvolemic on exam today  -Continue current CAD regimen>>see #1  6. HLD: -LDL on 09/20/18, 90  -LDL goal <70 -Increased pravastatin to 40mg  daily and will need repeat lipid panel to  follow  -Will repeat fasting lipid   Current medicines are reviewed at length with the patient today.  The patient does not have concerns regarding medicines.  The following changes have been made:  no change  Labs/ tests ordered today include: BMET, CBC  Orders Placed This Encounter  Procedures  . CBC  . Basic metabolic panel  . EKG 12-Lead    Disposition:   FU with Dr. Ellyn Hack in 6 months   Signed, Kathyrn Drown, NP  01/25/2019 1:16 PM    [address], [place], [place]  [address] Phone: [phone]; Fax: [phone]

## 2019-01-25 ENCOUNTER — Encounter: Payer: Self-pay | Admitting: Physician Assistant

## 2019-01-25 ENCOUNTER — Ambulatory Visit (INDEPENDENT_AMBULATORY_CARE_PROVIDER_SITE_OTHER): Payer: Medicare Other | Admitting: Cardiology

## 2019-01-25 VITALS — BP 118/70 | HR 81 | Ht 62.0 in | Wt 140.4 lb

## 2019-01-25 DIAGNOSIS — I10 Essential (primary) hypertension: Secondary | ICD-10-CM

## 2019-01-25 DIAGNOSIS — I2510 Atherosclerotic heart disease of native coronary artery without angina pectoris: Secondary | ICD-10-CM | POA: Diagnosis not present

## 2019-01-25 DIAGNOSIS — R42 Dizziness and giddiness: Secondary | ICD-10-CM | POA: Diagnosis not present

## 2019-01-25 DIAGNOSIS — I951 Orthostatic hypotension: Secondary | ICD-10-CM

## 2019-01-25 DIAGNOSIS — Z9861 Coronary angioplasty status: Secondary | ICD-10-CM

## 2019-01-25 NOTE — Patient Instructions (Addendum)
Medication Instructions:  Your physician recommends that you continue on your current medications as directed. Please refer to the Current Medication list given to you today.   Take over-the-counter Claritin for nasal congestion  If you need a refill on your cardiac medications before your next appointment, please call your pharmacy.   Lab work: TODAY: BMET, CBC  If you have labs (blood work) drawn today and your tests are completely normal, you will receive your results only by: Marland Kitchen MyChart Message (if you have MyChart) OR . A paper copy in the mail If you have any lab test that is abnormal or we need to change your treatment, we will call you to review the results.  Testing/Procedures: NONE   Follow-Up: At [facility], you and your health needs are our priority.  As part of our continuing mission to provide you with exceptional heart care, we have created designated Provider Care Teams.  These Care Teams include your primary Cardiologist (physician) and Advanced Practice Providers (APPs -  Physician Assistants and Nurse Practitioners) who all work together to provide you with the care you need, when you need it. . You will need a follow up appointment in:  6 months.  Please call our office 2 months in advance to schedule this appointment.  You may see DR, Areatha Keas or one of the following Advanced Practice Providers on your designated Care Team:  FOLLOW-UP WITH YOUR PRIMARY CARE DOCTOR IN 2-3 WEEKS.  Any Other Special Instructions Will Be Listed Below (If Applicable).  INCREASE FLUID INTAKE TO 6-8 GLASSES A DAY.  Please check your blood pressure 1-2 times per day for 2 weeks and record readings.

## 2019-01-26 LAB — BASIC METABOLIC PANEL
BUN / CREAT RATIO: 14 (ref 12–28)
BUN: 17 mg/dL (ref 8–27)
CO2: 22 mmol/L (ref 20–29)
CREATININE: 1.18 mg/dL — AB (ref 0.57–1.00)
Calcium: 8.8 mg/dL (ref 8.7–10.3)
Chloride: 104 mmol/L (ref 96–106)
GFR calc Af Amer: 49 mL/min/{1.73_m2} — ABNORMAL LOW (ref >59)
GFR, EST NON AFRICAN AMERICAN: 42 mL/min/{1.73_m2} — AB (ref >59)
GLUCOSE: 66 mg/dL (ref 65–99)
POTASSIUM: 4.2 mmol/L (ref 3.5–5.2)
Sodium: 143 mmol/L (ref 134–144)

## 2019-01-26 LAB — CBC
HEMATOCRIT: 35.3 % (ref 34.0–46.6)
HEMOGLOBIN: 11.3 g/dL (ref 11.1–15.9)
MCH: 29.3 pg (ref 26.6–33.0)
MCHC: 32 g/dL (ref 31.5–35.7)
MCV: 92 fL (ref 79–97)
Platelets: 306 10*3/uL (ref 150–450)
RBC: 3.86 x10E6/uL (ref 3.77–5.28)
RDW: 13.4 % (ref 11.7–15.4)
WBC: 6.2 10*3/uL (ref 3.4–10.8)

## 2019-02-14 DIAGNOSIS — H6092 Unspecified otitis externa, left ear: Secondary | ICD-10-CM | POA: Diagnosis not present

## 2019-04-19 ENCOUNTER — Encounter (HOSPITAL_COMMUNITY): Payer: Self-pay | Admitting: Emergency Medicine

## 2019-04-19 ENCOUNTER — Emergency Department (HOSPITAL_COMMUNITY): Payer: Medicare Other

## 2019-04-19 ENCOUNTER — Inpatient Hospital Stay (HOSPITAL_COMMUNITY)
Admission: EM | Admit: 2019-04-19 | Discharge: 2019-04-22 | DRG: 381 | Disposition: A | Discharge status: Home / Self Care | Payer: Medicare Other | Attending: Internal Medicine | Admitting: Internal Medicine

## 2019-04-19 ENCOUNTER — Other Ambulatory Visit: Payer: Self-pay

## 2019-04-19 DIAGNOSIS — I5032 Chronic diastolic (congestive) heart failure: Secondary | ICD-10-CM | POA: Diagnosis present

## 2019-04-19 DIAGNOSIS — N183 Chronic kidney disease, stage 3 unspecified: Secondary | ICD-10-CM | POA: Diagnosis present

## 2019-04-19 DIAGNOSIS — R069 Unspecified abnormalities of breathing: Secondary | ICD-10-CM | POA: Diagnosis not present

## 2019-04-19 DIAGNOSIS — K2211 Ulcer of esophagus with bleeding: Secondary | ICD-10-CM | POA: Diagnosis not present

## 2019-04-19 DIAGNOSIS — Z20828 Contact with and (suspected) exposure to other viral communicable diseases: Secondary | ICD-10-CM | POA: Diagnosis not present

## 2019-04-19 DIAGNOSIS — K259 Gastric ulcer, unspecified as acute or chronic, without hemorrhage or perforation: Secondary | ICD-10-CM | POA: Diagnosis present

## 2019-04-19 DIAGNOSIS — R05 Cough: Secondary | ICD-10-CM | POA: Diagnosis not present

## 2019-04-19 DIAGNOSIS — R000 Tachycardia, unspecified: Secondary | ICD-10-CM | POA: Diagnosis not present

## 2019-04-19 DIAGNOSIS — Z9012 Acquired absence of left breast and nipple: Secondary | ICD-10-CM

## 2019-04-19 DIAGNOSIS — I5042 Chronic combined systolic (congestive) and diastolic (congestive) heart failure: Secondary | ICD-10-CM | POA: Diagnosis not present

## 2019-04-19 DIAGNOSIS — R9431 Abnormal electrocardiogram [ECG] [EKG]: Secondary | ICD-10-CM | POA: Diagnosis not present

## 2019-04-19 DIAGNOSIS — K209 Esophagitis, unspecified without bleeding: Secondary | ICD-10-CM

## 2019-04-19 DIAGNOSIS — Z7902 Long term (current) use of antithrombotics/antiplatelets: Secondary | ICD-10-CM

## 2019-04-19 DIAGNOSIS — Z87891 Personal history of nicotine dependence: Secondary | ICD-10-CM

## 2019-04-19 DIAGNOSIS — R0602 Shortness of breath: Secondary | ICD-10-CM | POA: Diagnosis not present

## 2019-04-19 DIAGNOSIS — Z90710 Acquired absence of both cervix and uterus: Secondary | ICD-10-CM

## 2019-04-19 DIAGNOSIS — I350 Nonrheumatic aortic (valve) stenosis: Secondary | ICD-10-CM | POA: Diagnosis present

## 2019-04-19 DIAGNOSIS — Z955 Presence of coronary angioplasty implant and graft: Secondary | ICD-10-CM

## 2019-04-19 DIAGNOSIS — D62 Acute posthemorrhagic anemia: Secondary | ICD-10-CM

## 2019-04-19 DIAGNOSIS — I2510 Atherosclerotic heart disease of native coronary artery without angina pectoris: Secondary | ICD-10-CM | POA: Diagnosis present

## 2019-04-19 DIAGNOSIS — I714 Abdominal aortic aneurysm, without rupture, unspecified: Secondary | ICD-10-CM | POA: Diagnosis present

## 2019-04-19 DIAGNOSIS — I480 Paroxysmal atrial fibrillation: Secondary | ICD-10-CM | POA: Diagnosis present

## 2019-04-19 DIAGNOSIS — R0609 Other forms of dyspnea: Secondary | ICD-10-CM | POA: Diagnosis not present

## 2019-04-19 DIAGNOSIS — Z7982 Long term (current) use of aspirin: Secondary | ICD-10-CM

## 2019-04-19 DIAGNOSIS — K922 Gastrointestinal hemorrhage, unspecified: Secondary | ICD-10-CM | POA: Insufficient documentation

## 2019-04-19 DIAGNOSIS — R0902 Hypoxemia: Secondary | ICD-10-CM | POA: Diagnosis not present

## 2019-04-19 DIAGNOSIS — Z8673 Personal history of transient ischemic attack (TIA), and cerebral infarction without residual deficits: Secondary | ICD-10-CM | POA: Diagnosis not present

## 2019-04-19 DIAGNOSIS — Z79899 Other long term (current) drug therapy: Secondary | ICD-10-CM

## 2019-04-19 DIAGNOSIS — K449 Diaphragmatic hernia without obstruction or gangrene: Secondary | ICD-10-CM | POA: Diagnosis present

## 2019-04-19 DIAGNOSIS — I69354 Hemiplegia and hemiparesis following cerebral infarction affecting left non-dominant side: Secondary | ICD-10-CM | POA: Diagnosis not present

## 2019-04-19 DIAGNOSIS — Z853 Personal history of malignant neoplasm of breast: Secondary | ICD-10-CM | POA: Diagnosis not present

## 2019-04-19 DIAGNOSIS — R911 Solitary pulmonary nodule: Secondary | ICD-10-CM | POA: Diagnosis not present

## 2019-04-19 DIAGNOSIS — E7800 Pure hypercholesterolemia, unspecified: Secondary | ICD-10-CM | POA: Diagnosis present

## 2019-04-19 DIAGNOSIS — D649 Anemia, unspecified: Secondary | ICD-10-CM | POA: Diagnosis not present

## 2019-04-19 DIAGNOSIS — Z209 Contact with and (suspected) exposure to unspecified communicable disease: Secondary | ICD-10-CM | POA: Diagnosis not present

## 2019-04-19 DIAGNOSIS — Z8249 Family history of ischemic heart disease and other diseases of the circulatory system: Secondary | ICD-10-CM

## 2019-04-19 LAB — CBC WITH DIFFERENTIAL/PLATELET
Abs Immature Granulocytes: 0.02 10*3/uL (ref 0.00–0.07)
Basophils Absolute: 0 10*3/uL (ref 0.0–0.1)
Basophils Relative: 1 %
Eosinophils Absolute: 0.3 10*3/uL (ref 0.0–0.5)
Eosinophils Relative: 5 %
HCT: 25 % — ABNORMAL LOW (ref 36.0–46.0)
Hemoglobin: 7.2 g/dL — ABNORMAL LOW (ref 12.0–15.0)
Immature Granulocytes: 0 %
Lymphocytes Relative: 9 %
Lymphs Abs: 0.5 10*3/uL — ABNORMAL LOW (ref 0.7–4.0)
MCH: 23.8 pg — ABNORMAL LOW (ref 26.0–34.0)
MCHC: 28.8 g/dL — ABNORMAL LOW (ref 30.0–36.0)
MCV: 82.5 fL (ref 80.0–100.0)
Monocytes Absolute: 0.6 10*3/uL (ref 0.1–1.0)
Monocytes Relative: 11 %
Neutro Abs: 4 10*3/uL (ref 1.7–7.7)
Neutrophils Relative %: 74 %
Platelets: 358 10*3/uL (ref 150–400)
RBC: 3.03 MIL/uL — ABNORMAL LOW (ref 3.87–5.11)
RDW: 14.9 % (ref 11.5–15.5)
WBC: 5.3 10*3/uL (ref 4.0–10.5)
nRBC: 0 % (ref 0.0–0.2)

## 2019-04-19 LAB — D-DIMER, QUANTITATIVE: D-Dimer, Quant: 3.48 ug/mL-FEU — ABNORMAL HIGH (ref 0.00–0.50)

## 2019-04-19 LAB — COMPREHENSIVE METABOLIC PANEL
ALT: 11 U/L (ref 0–44)
AST: 16 U/L (ref 15–41)
Albumin: 3.6 g/dL (ref 3.5–5.0)
Alkaline Phosphatase: 53 U/L (ref 38–126)
Anion gap: 8 (ref 5–15)
BUN: 35 mg/dL — ABNORMAL HIGH (ref 8–23)
CO2: 24 mmol/L (ref 22–32)
Calcium: 8.4 mg/dL — ABNORMAL LOW (ref 8.9–10.3)
Chloride: 107 mmol/L (ref 98–111)
Creatinine, Ser: 1.35 mg/dL — ABNORMAL HIGH (ref 0.44–1.00)
GFR calc Af Amer: 41 mL/min — ABNORMAL LOW (ref >60)
GFR calc non Af Amer: 36 mL/min — ABNORMAL LOW (ref >60)
Glucose, Bld: 106 mg/dL — ABNORMAL HIGH (ref 70–99)
Potassium: 3.9 mmol/L (ref 3.5–5.1)
Sodium: 139 mmol/L (ref 135–145)
Total Bilirubin: 0.3 mg/dL (ref 0.3–1.2)
Total Protein: 6.7 g/dL (ref 6.5–8.1)

## 2019-04-19 LAB — POC OCCULT BLOOD, ED: Fecal Occult Bld: POSITIVE — AB

## 2019-04-19 LAB — SARS CORONAVIRUS 2 BY RT PCR (HOSPITAL ORDER, PERFORMED IN ~~LOC~~ HOSPITAL LAB): SARS Coronavirus 2: NEGATIVE

## 2019-04-19 LAB — TROPONIN I: Troponin I: <0.03 ng/mL (ref <0.03)

## 2019-04-19 LAB — BRAIN NATRIURETIC PEPTIDE: B Natriuretic Peptide: 211 pg/mL — ABNORMAL HIGH (ref 0.0–100.0)

## 2019-04-19 MED ORDER — ONDANSETRON HCL 4 MG/2ML IJ SOLN: 4.0000 mg | Freq: Four times a day (QID) | INTRAMUSCULAR | Status: DC | PRN

## 2019-04-19 MED ORDER — SODIUM CHLORIDE 0.9% FLUSH: 3.0000 mL | INTRAVENOUS | Status: DC | PRN

## 2019-04-19 MED ORDER — SODIUM CHLORIDE 0.9 % IV SOLN: 250.0000 mL | INTRAVENOUS | Status: DC | PRN

## 2019-04-19 MED ORDER — ACETAMINOPHEN 650 MG RE SUPP: 650.0000 mg | Freq: Four times a day (QID) | RECTAL | Status: DC | PRN

## 2019-04-19 MED ORDER — SODIUM CHLORIDE 0.9% IV SOLUTION
Freq: Once | INTRAVENOUS | Status: AC
  Administered 2019-04-20: 01:00:00 via INTRAVENOUS

## 2019-04-19 MED ORDER — SODIUM CHLORIDE 0.9% FLUSH
3.0000 mL | Freq: Two times a day (BID) | INTRAVENOUS | Status: DC
  Administered 2019-04-20 – 2019-04-21 (×2): 3 mL via INTRAVENOUS

## 2019-04-19 MED ORDER — ACETAMINOPHEN 325 MG PO TABS: 650.0000 mg | ORAL_TABLET | Freq: Four times a day (QID) | ORAL | Status: DC | PRN

## 2019-04-19 MED ORDER — ONDANSETRON HCL 4 MG PO TABS: 4.0000 mg | ORAL_TABLET | Freq: Four times a day (QID) | ORAL | Status: DC | PRN

## 2019-04-19 MED ORDER — IOHEXOL 350 MG/ML SOLN
100.0000 mL | Freq: Once | INTRAVENOUS | Status: AC | PRN
  Administered 2019-04-19: 21:00:00 75 mL via INTRAVENOUS

## 2019-04-19 MED ORDER — SODIUM CHLORIDE 0.9% FLUSH
3.0000 mL | Freq: Two times a day (BID) | INTRAVENOUS | Status: DC
  Administered 2019-04-20 – 2019-04-22 (×6): 3 mL via INTRAVENOUS

## 2019-04-19 MED ORDER — PANTOPRAZOLE SODIUM 40 MG IV SOLR
40.0000 mg | Freq: Two times a day (BID) | INTRAVENOUS | Status: DC
  Administered 2019-04-20 – 2019-04-21 (×5): 40 mg via INTRAVENOUS
  Filled 2019-04-19 (×5): qty 40

## 2019-04-19 NOTE — ED Provider Notes (Signed)
[facility] EMERGENCY DEPARTMENT Provider Note   CSN: [id number] Arrival date & time: 04/19/19  1819    History   Chief Complaint Chief Complaint  Patient presents with  . Shortness of Breath    HPI Leslie Carr is a 85 y.o. female.     HPI  85 y/o female - she has hx of BRCA and CHF and also a hx of slight stroke - and pleural effusion on the left - she presents today with a week of progressive SOB especially with exertion - no sig CP though she had an occasional LUQ abd cramp when she is getting up.  She has known CAD and has 4 stents placed in October of 2019.  She has only recently developed a mild cough in last 2 days - no phlegm and no fever.  She has mild swellign of the left ankle - no hx of DVT- her BRCA was resected and has been cancer free for many years.  EMS was questioned and additional history obtained  As they state that her VS were good, her O2 was good and she didn't need any interventions prehospital.  Past Medical History:  Diagnosis Date  . Breast cancer ([place])   . CHF (congestive heart failure) ([place])   . Heart murmur   . History of bronchitis   . Hypercholesteremia   . Numbness and tingling in left hand   . Pleural effusion on left   . Shortness of breath dyspnea    "only going up a flight of stairs"  . Stroke [facility]) 2014   left sided weakness - mainly left hand    Patient Active Problem List   Diagnosis Date Noted  . NSTEMI (non-ST elevated myocardial infarction) ([place]) 09/21/2018  . Abnormal nuclear stress test   . Unstable angina ([place]) 09/19/2018  . High blood pressure 09/19/2018  . Ascending aortic aneurysm ([place]) 09/19/2018  . Chronic combined systolic (congestive) and diastolic (congestive) heart failure ([place]) 09/19/2018  . CAD (coronary artery disease) 09/19/2018  . AKI (acute kidney injury) ([place]) 09/19/2018  . CVA (cerebral vascular accident) ([place]) 04/22/2016  . PAF (paroxysmal atrial fibrillation) ([place]) 04/22/2016  . Aortic valve  disease 04/22/2016  . Recurrent pleural effusion on left 04/17/2016  . Recurrent left pleural effusion 04/09/2016  . Lung nodule, solitary 04/09/2016  . Atelectasis 04/09/2016  . Status post CVA 04/09/2016  . Hyperlipidemia 04/09/2016  . Heart murmur 04/09/2016  . History of breast cancer 04/09/2016    Past Surgical History:  Procedure Laterality Date  . ABDOMINAL HYSTERECTOMY  1976  . CATARACT EXTRACTION Right   . COLONOSCOPY    . CORONARY ATHERECTOMY N/A 09/24/2018   Procedure: CORONARY ATHERECTOMY;  Surgeon: Leonie Man, MD;  Location: [place] [facility];  Service: Cardiovascular;  Laterality: N/A;  . CORONARY STENT INTERVENTION N/A 09/21/2018   Procedure: CORONARY STENT INTERVENTION;  Surgeon: Leonie Man, MD;  Location: [place] [facility];  Service: Cardiovascular;  Laterality: N/A;  . DECORTICATION Left 04/17/2016   Procedure: Visceral Pleural DECORTICATION;  Surgeon: Melrose Nakayama, MD;  Location: [place];  Service: Thoracic;  Laterality: Left;  . FOOT SURGERY Right    "bone spur removal"  . LEFT HEART CATH AND CORONARY ANGIOGRAPHY N/A 09/21/2018   Procedure: LEFT HEART CATH AND CORONARY ANGIOGRAPHY;  Surgeon: Leonie Man, MD;  Location: [place] [facility];  Service: Cardiovascular;  Laterality: N/A;  . MASTECTOMY Left 1985  . PLEURAL BIOPSY Left 04/17/2016   Procedure:  Left PLEURAL BIOPSY;  Surgeon: Melrose Nakayama, MD;  Location: [place];  Service: Thoracic;  Laterality: Left;  . PLEURAL EFFUSION DRAINAGE Left 04/17/2016   Procedure: DRAINAGE OF Left PLEURAL EFFUSION;  Surgeon: Melrose Nakayama, MD;  Location: [place];  Service: Thoracic;  Laterality: Left;  . TONSILLECTOMY    . VIDEO ASSISTED THORACOSCOPY Left 04/17/2016   Procedure:  Left VIDEO ASSISTED THORACOSCOPY with Drainage of Pleural Effusion, Pleural Biopsy, Visceral Pleural Decortication and Placement of On-Q pain pump;  Surgeon: Melrose Nakayama, MD;  Location: [place];  Service: Thoracic;   Laterality: Left;     OB History   No obstetric history on file.      Home Medications    Prior to Admission medications   Medication Sig Start Date End Date Taking? Authorizing Provider  aspirin EC 81 MG tablet Take 81 mg by mouth daily.    [provider]  clopidogrel (PLAVIX) 75 MG tablet Take 1 tablet (75 mg total) by mouth daily with breakfast. 10/12/18   Kathyrn Drown D, NP  docusate sodium (COLACE) 100 MG capsule Take 100 mg by mouth every evening.    [provider]  Multiple Vitamins-Minerals (EMERGEN-C IMMUNE PLUS PO) Take 1 packet by mouth daily.    [provider]  pravastatin (PRAVACHOL) 40 MG tablet Take 1 tablet (40 mg total) by mouth daily. 10/12/18   Tommie Raymond, NP  Propylene Glycol (SYSTANE COMPLETE OP) Apply to eye.    [provider]  vitamin B-12 (CYANOCOBALAMIN) 1000 MCG tablet Take 1,000 mcg by mouth daily.    [provider]    Family History Family History  Problem Relation Age of Onset  . Hypertension Mother   . Hypertension Father   . Heart disease Neg Hx   . Heart attack Neg Hx   . Hyperlipidemia Neg Hx   . Heart failure Neg Hx     Social History Social History   Tobacco Use  . Smoking status: Former Research scientist (life sciences)  . Smokeless tobacco: Never Used  . Tobacco comment: Quit over 40 years ago  Substance Use Topics  . Alcohol use: No    Alcohol/week: 0.0 standard drinks  . Drug use: No     Allergies   Lipitor [atorvastatin]   Review of Systems Review of Systems  Constitutional: Negative for chills and fever.  HENT: Negative for sore throat.   Eyes: Negative for visual disturbance.  Respiratory: Positive for cough and shortness of breath.   Cardiovascular: Negative for chest pain.  Gastrointestinal: Negative for abdominal pain, diarrhea, nausea and vomiting.  Genitourinary: Negative for dysuria and frequency.  Musculoskeletal: Negative for back pain and neck pain.  Skin: Negative for rash.   Neurological: Negative for weakness, numbness and headaches.  Hematological: Negative for adenopathy.  Psychiatric/Behavioral: Negative for behavioral problems.  All other systems reviewed and are negative.    Physical Exam Updated Vital Signs Ht 1.6 m ('5\' 3"' )   Wt 63.5 kg   BMI 24.80 kg/m   Physical Exam Vitals signs and nursing note reviewed.  Constitutional:      General: She is not in acute distress.    Appearance: She is well-developed.  HENT:     Head: Normocephalic and atraumatic.     Mouth/Throat:     Pharynx: No oropharyngeal exudate.  Eyes:     General: No scleral icterus.       Right eye: No discharge.        Left eye: No  discharge.     Conjunctiva/sclera: Conjunctivae normal.     Pupils: Pupils are equal, round, and reactive to light.     Comments: Conjunct pale  Neck:     Musculoskeletal: Normal range of motion and neck supple.     Thyroid: No thyromegaly.     Vascular: No JVD.  Cardiovascular:     Rate and Rhythm: Normal rate and regular rhythm.     Heart sounds: Murmur ( chronic systolic) present. No friction rub. No gallop.   Pulmonary:     Effort: Pulmonary effort is normal. No respiratory distress.     Breath sounds: Normal breath sounds. No wheezing or rales.     Comments: Slight abnormal sounds at the L base - diminished but no rales and no wheezing and no tachypnea or increased WOB. Abdominal:     General: Bowel sounds are normal. There is no distension.     Palpations: Abdomen is soft. There is no mass.     Tenderness: There is no abdominal tenderness.  Genitourinary:    Rectum: Guaiac result positive.     Comments: Rectal exam with dark red blood - no formed brown stool Musculoskeletal: Normal range of motion.        General: No tenderness.     Left lower leg: Edema ( at ankle - mild pitting, no othe redema) present.  Lymphadenopathy:     Cervical: No cervical adenopathy.  Skin:    General: Skin is warm and dry.     Findings: No erythema  or rash.  Neurological:     Mental Status: She is alert.     Coordination: Coordination normal.  Psychiatric:        Behavior: Behavior normal.      ED Treatments / Results  Labs (all labs ordered are listed, but only abnormal results are displayed) Labs Reviewed  NOVEL CORONAVIRUS, NAA (HOSPITAL ORDER, SEND-OUT TO REF LAB)  CBC WITH DIFFERENTIAL/PLATELET  COMPREHENSIVE METABOLIC PANEL  TROPONIN I  BRAIN NATRIURETIC PEPTIDE  D-DIMER, QUANTITATIVE (NOT AT [facility])    EKG EKG Interpretation  Date/Time:  Tuesday April 19 2019 18:34:58 EDT Ventricular Rate:  87 PR Interval:    QRS Duration: 104 QT Interval:  391 QTC Calculation: 471 R Axis:   -11 Text Interpretation:  Sinus rhythm Abnormal R-wave progression, early transition since last tracing no significant change Confirmed by Noemi Chapel [phone]) on 04/19/2019 6:47:49 PM   Radiology Dg Chest Port 1 View  Result Date: 04/19/2019 CLINICAL DATA:  Shortness of breath.  Cough. EXAM: PORTABLE CHEST 1 VIEW COMPARISON:  None. FINDINGS: There is a 2 x 3 cm poorly defined density in the right midzone. The lungs are otherwise clear. Heart size and vascularity are normal. Aortic atherosclerosis. Slight apical pleural thickening on the left. No acute bone abnormalities. Arthritic changes at the left shoulder. Old healed left lateral rib fracture. IMPRESSION: 1. Ill-defined density in the right midzone. This could represent a focal infiltrate or a mass. 2.  Aortic Atherosclerosis (ICD10-I70.0). Electronically Signed   By: Lorriane Shire M.D.   On: 04/19/2019 19:23    Procedures Procedures (including critical care time)  Medications Ordered in ED Medications  sodium chloride flush (NS) 0.9 % injection 3 mL (has no administration in time range)     Initial Impression / Assessment and Plan / ED Course  I have reviewed the triage vital signs and the nursing notes.  Pertinent labs & imaging results that were available during my care of  the  patient were reviewed by me and considered in my medical decision making (see chart for details).  Clinical Course as of Apr 18 1952  Tue Apr 19, 2019  1907 I have personally looked at and interpreted the xray of the chest - AP portable - there appaers to be a subtle infiltrate in the R lateral chest / lung - no PTX, no other acute findigns.   [BM]    Clinical Course User Index [BM] Noemi Chapel, MD      Would consider Covid, CHF, PE, PNA, PTX and effusion recurrent - needs xray, labs and EKG as ACS could present with dyspnea on exertion a swell.  Has what appears to be pale conj and MM - she has stool that is immedaitley positive and Is actually grossly positiv e- consistent with a marron type melena mix - will d/w GI and admit to hospitalist.  Will get CT chest and CT abdomen to evaluate for source of bleeding / as well as to characteize the infiltrate / mass or PE in the chest.   Covid pending.  D/w Dr. Rogene Houston at change of shift who will arrange for admission - once CT's are back and covid has resulted - type and screen added, GI will be consulted.  Final Clinical Impressions(s) / ED Diagnoses   Final diagnoses:  Severe anemia  Dyspnea on exertion      Noemi Chapel, MD 04/19/19 2005

## 2019-04-19 NOTE — ED Notes (Signed)
Pt called daughter on her cell phone and requested that I speak with her regarding plan of care- this nurse spoke with pt daughter and discussed plan of care, including that pt will be admitted.

## 2019-04-19 NOTE — H&P (Signed)
History and Physical    Leslie Carr OXB:[id number] DOB: [birth year] DOA: 04/19/2019  PCP: [place], [place]   Patient coming from: Home   Chief Complaint: Dyspnea with exertion   HPI: Leslie Carr is a 85 y.o. female with medical history significant for coronary artery disease, history of stroke, history of breast cancer, and chronic combined systolic and diastolic CHF, now presenting to the emergency department for evaluation of shortness of breath.  The patient reports approximately 3 weeks of worsening dyspnea with exertion.  She walks 1 to 1.5 miles every day and began to notice shortness of breath where she had previously tolerated this well.  Exertional dyspnea has progressively worsened.  She has had occasional upper abdominal pain over this interval that has been mild and not bothering her much.  She had an episode of maroon stool approximately a week ago, but thought that her bowel movements have been fairly normal since then.  She denies any indigestion, vomiting, or diarrhea.  Reports that she has had 3 colonoscopies previously and did not remember hearing of any abnormalities.  She does not believe that she is ever had an upper endoscopy, denies abdominal pain or vomiting, and denies any NSAID or alcohol use.  ED Course: Upon arrival to the ED, patient is found to be afebrile, saturating well on room air, and with vitals otherwise normal.  EKG features a sinus rhythm.  CTA chest/abdomen/pelvis is notable for sigmoid diverticulosis without diverticulitis and a AAA without rupture.  Chemistry panel is notable for a creatinine 1.35, similar to priors, but with BUN twice her prior values.  CBC is notable for hemoglobin of 7.2, down from 11.3 in February.  Troponin is undetectable and BNP 211.  COVID-19 testing is negative.  DRE reveals maroon stool which is FOBT positive.  Type and screen was performed and gastroenterology was consulted by the ED physician, recommended  medical admission.  Review of Systems:  All other systems reviewed and apart from HPI, are negative.  Past Medical History:  Diagnosis Date   Breast cancer ([place])    CHF (congestive heart failure) (HCC)    Heart murmur    History of bronchitis    Hypercholesteremia    Numbness and tingling in left hand    Pleural effusion on left    Shortness of breath dyspnea    "only going up a flight of stairs"   Stroke [facility]) 2014   left sided weakness - mainly left hand    Past Surgical History:  Procedure Laterality Date   ABDOMINAL HYSTERECTOMY  1976   CATARACT EXTRACTION Right    COLONOSCOPY     CORONARY ATHERECTOMY N/A 09/24/2018   Procedure: CORONARY ATHERECTOMY;  Surgeon: Leonie Man, MD;  Location: [place] [facility];  Service: Cardiovascular;  Laterality: N/A;   CORONARY STENT INTERVENTION N/A 09/21/2018   Procedure: CORONARY STENT INTERVENTION;  Surgeon: Leonie Man, MD;  Location: [place] [facility];  Service: Cardiovascular;  Laterality: N/A;   DECORTICATION Left 04/17/2016   Procedure: Visceral Pleural DECORTICATION;  Surgeon: Melrose Nakayama, MD;  Location: [place];  Service: Thoracic;  Laterality: Left;   FOOT SURGERY Right    "bone spur removal"   LEFT HEART CATH AND CORONARY ANGIOGRAPHY N/A 09/21/2018   Procedure: LEFT HEART CATH AND CORONARY ANGIOGRAPHY;  Surgeon: Leonie Man, MD;  Location: [place] [facility];  Service: Cardiovascular;  Laterality: N/A;   MASTECTOMY Left 1985   PLEURAL BIOPSY Left 04/17/2016  Procedure:  Left PLEURAL BIOPSY;  Surgeon: Melrose Nakayama, MD;  Location: [place];  Service: Thoracic;  Laterality: Left;   PLEURAL EFFUSION DRAINAGE Left 04/17/2016   Procedure: DRAINAGE OF Left PLEURAL EFFUSION;  Surgeon: Melrose Nakayama, MD;  Location: [place];  Service: Thoracic;  Laterality: Left;   TONSILLECTOMY     VIDEO ASSISTED THORACOSCOPY Left 04/17/2016   Procedure:  Left VIDEO ASSISTED THORACOSCOPY with Drainage  of Pleural Effusion, Pleural Biopsy, Visceral Pleural Decortication and Placement of On-Q pain pump;  Surgeon: Melrose Nakayama, MD;  Location: [place];  Service: Thoracic;  Laterality: Left;     reports that she has quit smoking. She has never used smokeless tobacco. She reports that she does not drink alcohol or use drugs.  Allergies  Allergen Reactions   Lipitor [Atorvastatin] Other (See Comments)    Dizzy and make her head feel huge     Family History  Problem Relation Age of Onset   Hypertension Mother    Hypertension Father    Heart disease Neg Hx    Heart attack Neg Hx    Hyperlipidemia Neg Hx    Heart failure Neg Hx      Prior to Admission medications   Medication Sig Start Date End Date Taking? Authorizing Provider  aspirin EC 81 MG tablet Take 81 mg by mouth daily.   Yes [provider]  clopidogrel (PLAVIX) 75 MG tablet Take 1 tablet (75 mg total) by mouth daily with breakfast. Patient taking differently: Take 75 mg by mouth every evening.  10/12/18  Yes Kathyrn Drown D, NP  docusate sodium (COLACE) 100 MG capsule Take 100 mg by mouth every evening.   Yes [provider]  Multiple Vitamins-Minerals (EMERGEN-C IMMUNE PLUS PO) Take 1 packet by mouth every evening.    Yes [provider]  pravastatin (PRAVACHOL) 20 MG tablet Take 20 mg by mouth every evening.  02/16/19  Yes [provider]    Physical Exam: Vitals:   04/19/19 1835 04/19/19 2000 04/19/19 2030 04/19/19 2225  BP: 135/74 (!) 146/70 (!) 155/88 (!) 149/77  Pulse: 89 86 87 92  Resp: 16 15 17 16   Temp: 98 F (36.7 C)     TempSrc: Oral     SpO2: 100% 99% 99% 99%  Weight:      Height:        Constitutional: NAD, calm  Eyes: PERTLA, lids and conjunctivae normal ENMT: Mucous membranes are moist. Posterior pharynx clear of any exudate or lesions.   Neck: normal, supple, no masses, no thyromegaly Respiratory: clear to auscultation bilaterally, no wheezing, no  crackles. No accessory muscle use.  Cardiovascular: S1 & S2 heard, regular rate and rhythm. No extremity edema.   Abdomen: No distension, no tenderness, soft. Bowel sounds active.  Musculoskeletal: no clubbing / cyanosis. No joint deformity upper and lower extremities.    Skin: no significant rashes, lesions, ulcers. Warm, dry, well-perfused. Neurologic: CN 2-12 grossly intact. Sensation intact. Moving all extremities.  Psychiatric: Alert and oriented x 3. Pleasant and cooperative.    Labs on Admission: I have personally reviewed following labs and imaging studies  CBC: Recent Labs  Lab 04/19/19 1905  WBC 5.3  NEUTROABS 4.0  HGB 7.2*  HCT 25.0*  MCV 82.5  PLT 785   Basic Metabolic Panel: Recent Labs  Lab 04/19/19 1905  NA 139  K 3.9  CL 107  CO2 24  GLUCOSE 106*  BUN 35*  CREATININE 1.35*  CALCIUM 8.4*  GFR: Estimated Creatinine Clearance: 27.3 mL/min (A) (by C-G formula based on SCr of 1.35 mg/dL (H)). Liver Function Tests: Recent Labs  Lab 04/19/19 1905  AST 16  ALT 11  ALKPHOS 53  BILITOT 0.3  PROT 6.7  ALBUMIN 3.6   No results for input(s): LIPASE, AMYLASE in the last 168 hours. No results for input(s): AMMONIA in the last 168 hours. Coagulation Profile: No results for input(s): INR, PROTIME in the last 168 hours. Cardiac Enzymes: Recent Labs  Lab 04/19/19 1905  TROPONINI <0.03   BNP (last 3 results) No results for input(s): PROBNP in the last 8760 hours. HbA1C: No results for input(s): HGBA1C in the last 72 hours. CBG: No results for input(s): GLUCAP in the last 168 hours. Lipid Profile: No results for input(s): CHOL, HDL, LDLCALC, TRIG, CHOLHDL, LDLDIRECT in the last 72 hours. Thyroid Function Tests: No results for input(s): TSH, T4TOTAL, FREET4, T3FREE, THYROIDAB in the last 72 hours. Anemia Panel: No results for input(s): VITAMINB12, FOLATE, FERRITIN, TIBC, IRON, RETICCTPCT in the last 72 hours. Urine analysis:    Component Value  Date/Time   COLORURINE YELLOW 04/15/2016 [place] 04/15/2016 1333   LABSPEC 1.014 04/15/2016 1333   PHURINE 6.0 04/15/2016 1333   GLUCOSEU NEGATIVE 04/15/2016 1333   HGBUR NEGATIVE 04/15/2016 1333   BILIRUBINUR NEGATIVE 04/15/2016 1333   KETONESUR NEGATIVE 04/15/2016 1333   PROTEINUR NEGATIVE 04/15/2016 1333   NITRITE NEGATIVE 04/15/2016 1333   LEUKOCYTESUR NEGATIVE 04/15/2016 1333   Sepsis Labs: @LABRCNTIP (procalcitonin:4,lacticidven:4) ) Recent Results (from the past 240 hour(s))  SARS Coronavirus 2 [facility] order, Performed in [place] hospital lab)     Status: None   Collection Time: 04/19/19  6:49 PM  Result Value Ref Range Status   SARS Coronavirus 2 NEGATIVE NEGATIVE Final    Comment: (NOTE) If result is NEGATIVE SARS-CoV-2 target nucleic acids are NOT DETECTED. The SARS-CoV-2 RNA is generally detectable in upper and lower  respiratory specimens during the acute phase of infection. The lowest  concentration of SARS-CoV-2 viral copies this assay can detect is 250  copies / mL. A negative result does not preclude SARS-CoV-2 infection  and should not be used as the sole basis for treatment or other  patient management decisions.  A negative result may occur with  improper specimen collection / handling, submission of specimen other  than nasopharyngeal swab, presence of viral mutation(s) within the  areas targeted by this assay, and inadequate number of viral copies  (<250 copies / mL). A negative result must be combined with clinical  observations, patient history, and epidemiological information. If result is POSITIVE SARS-CoV-2 target nucleic acids are DETECTED. The SARS-CoV-2 RNA is generally detectable in upper and lower  respiratory specimens dur ing the acute phase of infection.  Positive  results are indicative of active infection with SARS-CoV-2.  Clinical  correlation with patient history and other diagnostic information is  necessary to  determine patient infection status.  Positive results do  not rule out bacterial infection or co-infection with other viruses. If result is PRESUMPTIVE POSTIVE SARS-CoV-2 nucleic acids MAY BE PRESENT.   A presumptive positive result was obtained on the submitted specimen  and confirmed on repeat testing.  While 2019 novel coronavirus  (SARS-CoV-2) nucleic acids may be present in the submitted sample  additional confirmatory testing may be necessary for epidemiological  and / or clinical management purposes  to differentiate between  SARS-CoV-2 and other Sarbecovirus currently known to infect humans.  If clinically  indicated additional testing with an alternate test  methodology [phone]) is advised. The SARS-CoV-2 RNA is generally  detectable in upper and lower respiratory sp ecimens during the acute  phase of infection. The expected result is Negative. Fact Sheet for Patients:  StrictlyIdeas.no Fact Sheet for Healthcare Providers: BankingDealers.co.za This test is not yet approved or cleared by the [place] FDA and has been authorized for detection and/or diagnosis of SARS-CoV-2 by FDA under an Emergency Use Authorization (EUA).  This EUA will remain in effect (meaning this test can be used) for the duration of the COVID-19 declaration under Section 564(b)(1) of the Act, 21 U.S.C. section 360bbb-3(b)(1), unless the authorization is terminated or revoked sooner. Performed at [facility], [address], [place], [place] [address]      Radiological Exams on Admission: Ct Angio Chest Pe W And/or Wo Contrast  Result Date: 04/19/2019 CLINICAL DATA:  Right middle lobe nodule. 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 EXAM: CT ANGIOGRAPHY CHEST WITH CONTRAST TECHNIQUE: Multidetector CT imaging of the chest was performed using the standard protocol during bolus administration of intravenous  contrast. Multiplanar CT image reconstructions and MIPs were obtained to evaluate the vascular anatomy. CONTRAST:  48mL OMNIPAQUE IOHEXOL 350 MG/ML SOLN COMPARISON:  CT chest 04/06/2018 FINDINGS: Cardiovascular: Extensive atherosclerotic disease in the thoracic aorta. Ascending aortic aneurysm 4.4 x 4.3 cm unchanged. Extensive coronary calcification. Heart size within normal limits. Negative for pulmonary embolism Mediastinum/Nodes: Negative for mass or adenopathy. Lungs/Pleura: Right middle lobe mass 20 x 30 mm with irregular margins and small calcifications. Previously, this measured 2.9 x 2.1 x 1.8 cm and is approximately the same in size. Previous biopsy demonstrated no malignancy and amyloid deposition. Pleuroparenchymal scarring left upper lobe medially and lingula unchanged from the prior study. No acute infiltrate or pleural effusion. Musculoskeletal: Multilevel degenerative change in the thoracic spine. No fracture or bone lesion. Review of the MIP images confirms the above findings. IMPRESSION: 1. Right middle lobe nodule with calcification stable from prior study. 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 2. Atherosclerotic aorta. Ascending aortic aneurysm 4.4 x 4.3 cm, stable Recommend annual imaging followup by CTA or MRA. This recommendation follows 2010 ACCF/AHA/AATS/ACR/ASA/SCA/SCAI/SIR/STS/SVM Guidelines for the Diagnosis and Management of Patients with Thoracic Aortic Disease. Circulation. 2010; 121: S341-D622. Aortic aneurysm NOS (ICD10-I71.9) 3. Scarring left upper lobe and lingula stable. No acute abnormality in the lungs. Electronically Signed   By: Franchot Gallo M.D.   On: 04/19/2019 21:46   Ct Abdomen Pelvis W Contrast  Result Date: 04/19/2019 CLINICAL DATA:  GI bleed EXAM: CT ABDOMEN AND PELVIS WITH CONTRAST TECHNIQUE: Multidetector CT imaging of the abdomen and pelvis was performed using the standard protocol following bolus administration of intravenous  contrast. CONTRAST:  57mL OMNIPAQUE IOHEXOL 350 MG/ML SOLN COMPARISON:  None. FINDINGS: Hepatobiliary: No focal liver abnormality is seen. No gallstones, gallbladder wall thickening, or biliary dilatation. Pancreas: Negative Spleen: Negative Adrenals/Urinary Tract: Adrenal glands are unremarkable. Kidneys are normal, without renal calculi, focal lesion, or hydronephrosis. Bladder is unremarkable. Stomach/Bowel: Negative for bowel obstruction. Sigmoid diverticulosis without diverticulitis. Diverticula throughout the left colon. No mass or edema in the bowel. Appendix nonvisualized. Vascular/Lymphatic: Extensive atherosclerotic disease in the abdominal aorta. Abdominal aortic aneurysm measuring 3.7 x 3.5 cm in diameter. No evidence of aortic rupture. Atherosclerotic disease in the iliac arteries bilaterally. No worrisome lymphadenopathy. Reproductive: Hysterectomy.  No pelvic mass. Other: Negative for free fluid or free air. Musculoskeletal: Lumbar and thoracic  disc degeneration. No acute skeletal abnormality. IMPRESSION: Sigmoid diverticulosis without diverticulitis. Abdominal aortic aneurysm 3.7 x 3.5 cm. Recommend followup by ultrasound in 2 years. This recommendation follows ACR consensus guidelines: White Paper of the ACR Incidental Findings Committee II on Vascular Findings. J Am Coll Radiol 2013; 10:789-794. Aortic aneurysm NOS (ICD10-I71.9) Electronically Signed   By: Franchot Gallo M.D.   On: 04/19/2019 21:33   Dg Chest Port 1 View  Result Date: 04/19/2019 CLINICAL DATA:  Shortness of breath.  Cough. EXAM: PORTABLE CHEST 1 VIEW COMPARISON:  None. FINDINGS: There is a 2 x 3 cm poorly defined density in the right midzone. The lungs are otherwise clear. Heart size and vascularity are normal. Aortic atherosclerosis. Slight apical pleural thickening on the left. No acute bone abnormalities. Arthritic changes at the left shoulder. Old healed left lateral rib fracture. IMPRESSION: 1. Ill-defined density in the  right midzone. This could represent a focal infiltrate or a mass. 2.  Aortic Atherosclerosis (ICD10-I70.0). Electronically Signed   By: Lorriane Shire M.D.   On: 04/19/2019 19:23    EKG: Independently reviewed. Sinus rhythm.   Assessment/Plan   1. Symptomatic anemia; GI bleeding  - Presents with 3 wks of progressive exertional dyspnea and intermittently maroon stool  - She is found to have Hgb of 7.2, down from 11.3 in February  - She has diverticulosis, never had EGD, denies NSAID or alcohol use  - Abd is soft and non-tender and she has remained hemodynamically stable  - Lower source seems more likely but she has had occasional upper abdominal discomfort and BUN is ~2x prior values  - Transfuse 1 unit, start Protonix 40 mg IV q12h, hold ASA and Plavix, check post-transfusion CBC, continue bowel-rest, follow-up GI recommendations    2. CAD  - No anginal complaints  - Antiplatelets held on admission   3. History of CVA  - Some residual LUE weakness, but no acute deficit   - ASA and Plavix held as above   4. CKD stage III  - SCr is 1.35 on admission, similar to priors  - Renally-dose medications    5. Chronic combined systolic & diastolic CHF  - Appears compensated, follow daily wt    6. AAA - 3.7 x 3.5 cm AAA noted incidentally on CTA  - Follow up with [place] in 2 years is recommended    PPE: Mask, face shield  DVT prophylaxis: SCD's  Code Status: Full  Family Communication: Discussed with patient   Consults called: GI  Admission status: Observation     Vianne Bulls, MD [facility] Pager [phone]  If 7PM-7AM, please contact night-coverage [email or web address] Password [facility]  04/19/2019, 11:05 PM

## 2019-04-19 NOTE — ED Notes (Signed)
Dr Miller at bedside. 

## 2019-04-19 NOTE — ED Notes (Signed)
EKG given to Dr. Miller.  

## 2019-04-19 NOTE — ED Notes (Signed)
Ambulated pt 1 lap around nurse's station and she became very SOB. Respirations increased to 39, HR to 122, but O2 sats remained 96% on RA.

## 2019-04-19 NOTE — ED Triage Notes (Signed)
Per EMS, pt reports increased SOB especially when up walking x 1 week. Pt does report cough earlier today. Denies fever.

## 2019-04-19 NOTE — ED Notes (Signed)
Pt given banana and ginger ale per request.  

## 2019-04-19 NOTE — ED Provider Notes (Signed)
Patient turned over to me by Dr. Sabra Heck.  Patient with heme positive stools so GI bleed.  CT scan of the abdomen pelvis shows diverticulosis could be the source of the bleed.  Hemoglobin currently 7.2.  Patient not meeting threshold right now for transfusion.  But may get there.  Discussed with Dr. Oneida Alar on-call for GI medicine.  She stated that Dr. Melony Overly will be available to see her tomorrow in consultation.  CT angios chest showed an aneurysm but no acute findings on the CT chest.  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  They can see where that occurred.  They do not feel that there is anything that needs to be done acutely about that.  Patient requires admission will discuss with the hospitalist.   Fredia Sorrow, MD 04/19/19 2225

## 2019-04-20 ENCOUNTER — Other Ambulatory Visit: Payer: Self-pay

## 2019-04-20 ENCOUNTER — Encounter (HOSPITAL_COMMUNITY)
Admission: EM | Disposition: A | Discharge status: Home / Self Care | Payer: Self-pay | Source: Home / Self Care | Attending: Internal Medicine

## 2019-04-20 ENCOUNTER — Encounter (HOSPITAL_COMMUNITY): Payer: Self-pay | Admitting: *Deleted

## 2019-04-20 DIAGNOSIS — Z90710 Acquired absence of both cervix and uterus: Secondary | ICD-10-CM | POA: Diagnosis not present

## 2019-04-20 DIAGNOSIS — K2211 Ulcer of esophagus with bleeding: Secondary | ICD-10-CM | POA: Diagnosis not present

## 2019-04-20 DIAGNOSIS — N183 Chronic kidney disease, stage 3 (moderate): Secondary | ICD-10-CM | POA: Diagnosis not present

## 2019-04-20 DIAGNOSIS — Z20828 Contact with and (suspected) exposure to other viral communicable diseases: Secondary | ICD-10-CM | POA: Diagnosis present

## 2019-04-20 DIAGNOSIS — K449 Diaphragmatic hernia without obstruction or gangrene: Secondary | ICD-10-CM | POA: Diagnosis not present

## 2019-04-20 DIAGNOSIS — Z955 Presence of coronary angioplasty implant and graft: Secondary | ICD-10-CM | POA: Diagnosis not present

## 2019-04-20 DIAGNOSIS — Z7982 Long term (current) use of aspirin: Secondary | ICD-10-CM | POA: Diagnosis not present

## 2019-04-20 DIAGNOSIS — I480 Paroxysmal atrial fibrillation: Secondary | ICD-10-CM | POA: Diagnosis present

## 2019-04-20 DIAGNOSIS — Z87891 Personal history of nicotine dependence: Secondary | ICD-10-CM | POA: Diagnosis not present

## 2019-04-20 DIAGNOSIS — Z79899 Other long term (current) drug therapy: Secondary | ICD-10-CM | POA: Diagnosis not present

## 2019-04-20 DIAGNOSIS — Z7902 Long term (current) use of antithrombotics/antiplatelets: Secondary | ICD-10-CM | POA: Diagnosis not present

## 2019-04-20 DIAGNOSIS — D62 Acute posthemorrhagic anemia: Secondary | ICD-10-CM

## 2019-04-20 DIAGNOSIS — K209 Esophagitis, unspecified: Secondary | ICD-10-CM | POA: Diagnosis not present

## 2019-04-20 DIAGNOSIS — R0602 Shortness of breath: Secondary | ICD-10-CM | POA: Diagnosis not present

## 2019-04-20 DIAGNOSIS — I714 Abdominal aortic aneurysm, without rupture: Secondary | ICD-10-CM | POA: Diagnosis not present

## 2019-04-20 DIAGNOSIS — I69354 Hemiplegia and hemiparesis following cerebral infarction affecting left non-dominant side: Secondary | ICD-10-CM | POA: Diagnosis not present

## 2019-04-20 DIAGNOSIS — Z853 Personal history of malignant neoplasm of breast: Secondary | ICD-10-CM | POA: Diagnosis not present

## 2019-04-20 DIAGNOSIS — D649 Anemia, unspecified: Secondary | ICD-10-CM | POA: Diagnosis not present

## 2019-04-20 DIAGNOSIS — K259 Gastric ulcer, unspecified as acute or chronic, without hemorrhage or perforation: Secondary | ICD-10-CM | POA: Diagnosis not present

## 2019-04-20 DIAGNOSIS — I5042 Chronic combined systolic (congestive) and diastolic (congestive) heart failure: Secondary | ICD-10-CM | POA: Diagnosis not present

## 2019-04-20 DIAGNOSIS — K922 Gastrointestinal hemorrhage, unspecified: Secondary | ICD-10-CM | POA: Diagnosis not present

## 2019-04-20 DIAGNOSIS — I2510 Atherosclerotic heart disease of native coronary artery without angina pectoris: Secondary | ICD-10-CM | POA: Diagnosis not present

## 2019-04-20 DIAGNOSIS — Z8249 Family history of ischemic heart disease and other diseases of the circulatory system: Secondary | ICD-10-CM | POA: Diagnosis not present

## 2019-04-20 DIAGNOSIS — Z8673 Personal history of transient ischemic attack (TIA), and cerebral infarction without residual deficits: Secondary | ICD-10-CM | POA: Diagnosis not present

## 2019-04-20 DIAGNOSIS — E7800 Pure hypercholesterolemia, unspecified: Secondary | ICD-10-CM | POA: Diagnosis present

## 2019-04-20 DIAGNOSIS — Z9012 Acquired absence of left breast and nipple: Secondary | ICD-10-CM | POA: Diagnosis not present

## 2019-04-20 DIAGNOSIS — I350 Nonrheumatic aortic (valve) stenosis: Secondary | ICD-10-CM | POA: Diagnosis present

## 2019-04-20 HISTORY — PX: ESOPHAGOGASTRODUODENOSCOPY: SHX5428

## 2019-04-20 LAB — BASIC METABOLIC PANEL
Anion gap: 9 (ref 5–15)
BUN: 28 mg/dL — ABNORMAL HIGH (ref 8–23)
CO2: 24 mmol/L (ref 22–32)
Calcium: 8.3 mg/dL — ABNORMAL LOW (ref 8.9–10.3)
Chloride: 107 mmol/L (ref 98–111)
Creatinine, Ser: 1.3 mg/dL — ABNORMAL HIGH (ref 0.44–1.00)
GFR calc Af Amer: 43 mL/min — ABNORMAL LOW (ref >60)
GFR calc non Af Amer: 37 mL/min — ABNORMAL LOW (ref >60)
Glucose, Bld: 94 mg/dL (ref 70–99)
Potassium: 4.1 mmol/L (ref 3.5–5.1)
Sodium: 140 mmol/L (ref 135–145)

## 2019-04-20 LAB — ABO/RH: ABO/RH(D): B POS

## 2019-04-20 LAB — CBC
HCT: 27.9 % — ABNORMAL LOW (ref 36.0–46.0)
Hemoglobin: 8.4 g/dL — ABNORMAL LOW (ref 12.0–15.0)
MCH: 24.7 pg — ABNORMAL LOW (ref 26.0–34.0)
MCHC: 30.1 g/dL (ref 30.0–36.0)
MCV: 82.1 fL (ref 80.0–100.0)
Platelets: 320 10*3/uL (ref 150–400)
RBC: 3.4 MIL/uL — ABNORMAL LOW (ref 3.87–5.11)
RDW: 14.5 % (ref 11.5–15.5)
WBC: 5.8 10*3/uL (ref 4.0–10.5)
nRBC: 0 % (ref 0.0–0.2)

## 2019-04-20 LAB — GLUCOSE, CAPILLARY: Glucose-Capillary: 88 mg/dL (ref 70–99)

## 2019-04-20 LAB — PREPARE RBC (CROSSMATCH)

## 2019-04-20 SURGERY — EGD (ESOPHAGOGASTRODUODENOSCOPY)
Anesthesia: Moderate Sedation

## 2019-04-20 SURGERY — IMAGING PROCEDURE, GI TRACT, INTRALUMINAL, VIA CAPSULE

## 2019-04-20 MED ORDER — LIDOCAINE VISCOUS HCL 2 % MT SOLN
OROMUCOSAL | Status: AC
  Filled 2019-04-20: qty 15

## 2019-04-20 MED ORDER — LIDOCAINE VISCOUS HCL 2 % MT SOLN
OROMUCOSAL | Status: DC | PRN
  Administered 2019-04-20: 1 via OROMUCOSAL

## 2019-04-20 MED ORDER — MIDAZOLAM HCL 5 MG/5ML IJ SOLN
INTRAMUSCULAR | Status: DC | PRN
  Administered 2019-04-20 (×3): 1 mg via INTRAVENOUS

## 2019-04-20 MED ORDER — SODIUM CHLORIDE 0.9% IV SOLUTION: Freq: Once | INTRAVENOUS | Status: DC

## 2019-04-20 MED ORDER — SODIUM CHLORIDE 0.9 % IV SOLN
INTRAVENOUS | Status: DC
  Administered 2019-04-20: 15:00:00 via INTRAVENOUS

## 2019-04-20 MED ORDER — MIDAZOLAM HCL 5 MG/5ML IJ SOLN
INTRAMUSCULAR | Status: AC
  Filled 2019-04-20: qty 10

## 2019-04-20 MED ORDER — MEPERIDINE HCL 50 MG/ML IJ SOLN
INTRAMUSCULAR | Status: AC
  Filled 2019-04-20: qty 1

## 2019-04-20 MED ORDER — STERILE WATER FOR IRRIGATION IR SOLN
Status: DC | PRN
  Administered 2019-04-20: 16:00:00 1.5 mL

## 2019-04-20 MED ORDER — FUROSEMIDE 10 MG/ML IJ SOLN
20.0000 mg | Freq: Once | INTRAMUSCULAR | Status: AC
  Administered 2019-04-20: 18:00:00 20 mg via INTRAVENOUS
  Filled 2019-04-20: qty 2

## 2019-04-20 MED ORDER — SODIUM CHLORIDE 0.9 % IV SOLN: INTRAVENOUS | Status: DC

## 2019-04-20 MED ORDER — SUCRALFATE 1 GM/10ML PO SUSP
1.0000 g | Freq: Three times a day (TID) | ORAL | Status: DC
  Administered 2019-04-20 – 2019-04-22 (×6): 1 g via ORAL
  Filled 2019-04-20 (×6): qty 10

## 2019-04-20 MED ORDER — MEPERIDINE HCL 50 MG/ML IJ SOLN
INTRAMUSCULAR | Status: DC | PRN
  Administered 2019-04-20 (×2): 15 mg via INTRAVENOUS

## 2019-04-20 NOTE — Progress Notes (Signed)
PROGRESS NOTE    Leslie Carr  HEN:[id number] DOB: 11-24-1931 DOA: 04/19/2019 PCP: [name]     Brief Narrative:  85 y.o. female with medical history significant for coronary artery disease, history of stroke, history of breast cancer, and chronic combined systolic and diastolic CHF, now presenting to the emergency department for evaluation of shortness of breath.  The patient reports approximately 3 weeks of worsening dyspnea with exertion.  She walks 1 to 1.5 miles every day and began to notice shortness of breath where she had previously tolerated this well.  Exertional dyspnea has progressively worsened.  She has had occasional upper abdominal pain over this interval that has been mild and not bothering her much.  She had an episode of maroon stool approximately a week ago, but thought that her bowel movements have been fairly normal since then.  She denies any indigestion, vomiting, or diarrhea.  Reports that she has had 3 colonoscopies previously and did not remember hearing of any abnormalities.  She does not believe that she is ever had an upper endoscopy, denies abdominal pain or vomiting, and denies any alcohol use.  Of note, patient expressed use on and off extra aspirin to assist with what appears to be restless leg syndrome.  Assessment & Plan: 1-symptomatic anemia: In the setting of acute blood loss anemia. -Patient fecal occult blood test was positive -GI on board and planning either EGD or capsule endoscopy -Hemoglobin 8.4 after 1 unit of PRBCs; patient is still demonstrating symptomatic anemia (present weakness, tiredness sensation and some shortness of breath on exertion). -Will transfuse an extra unit of PRBCs -Follow hemoglobin trend -Follow GI service recommendations. -Continue holding aspirin and Plavix -Patient instructed to avoid the use of NSAIDs. -Continue IV PPI.  2-coronary artery disease -Patient denies chest pains -Continue  holding antiplatelets medication in the setting of acute GI bleed -Once truly tolerating p.o.'s we will resume statins.  3-Status post CVA -There has not been complaints of acute focal deficits -Holding aspirin and Plavix in the setting of acute GI bleed -Once truly able to tolerate by mouth we will resume statins.  4-Chronic combined systolic (congestive) and diastolic (congestive) heart failure (HCC) -Compensated and is stable -Follow daily weights and I's and O's.  5-AAA (abdominal aortic aneurysm) (HCC) -Follow-up with ultrasound in 2 years as recommended -There is no signs of acute rupture  6-CKD (chronic kidney disease), stage III (HCC) -Serum creatinine 1.35 on admission which is similar to prior creatinine level on her records -Advised to maintain adequate oral hydration -Follow renal function trend.  DVT prophylaxis: SCDs Code Status: Full code Family Communication: Daughter updated over the phone Disposition Plan: Remains in the hospital, clear liquid diet, follow GI recommendations.  Patient most likely will require EGD or capsule endoscopy.  Hemoglobin at 8.4, still with some symptoms associated with anemia.  Transfuse 1 more unit of PRBCs and follow hemoglobin trend.  Consultants:   Gastroenterology  Procedures:   See below for x-ray reports.  Antimicrobials:  Anti-infectives (From admission, onward)   None      Subjective: Reports no chest pain, no nausea, no vomiting.  She is very hungry and has no noticed overt bleeding since admission.  Reports feeling weak and tired  Objective: Vitals:   04/20/19 1555 04/20/19 1600 04/20/19 1634 04/20/19 1730  BP: (!) 115/54 (!) 128/57 (!) 143/62 (!) 128/53  Pulse: 76 78 76 83  Resp: 10 18 18 18   Temp:  98.4 F (36.9 C)  98.4 F (36.9 C) 98.9 F (37.2 C)  TempSrc:  Oral Oral Oral  SpO2: 100% 99% 98%   Weight:      Height:        Intake/Output Summary (Last 24 hours) at 04/20/2019 1901 Last data filed at  04/20/2019 1730 Gross per 24 hour  Intake 927 ml  Output 800 ml  Net 127 ml   Filed Weights   04/19/19 1822 04/20/19 0500  Weight: 63.5 kg 63.6 kg    Examination: General exam: Alert, awake, oriented x 3; afebrile, denies chest pain, no nausea, no vomiting.  There has not been overt bleeding appreciated since admission.  Patient is hungry and very anxious given situation of no visitors and been alone while hospitalized. Respiratory system: Clear to auscultation. Respiratory effort normal. Cardiovascular system:RRR.  No rubs, no gallops, no JVD. Gastrointestinal system: Abdomen is nondistended, soft and nontender. No organomegaly or masses felt. Normal bowel sounds heard. Central nervous system: Alert and oriented. No focal neurological deficits. Extremities: No C/C/E, +pedal pulses Skin: No rashes, lesions or ulcers Psychiatry: Judgement and insight appear normal.  Patient was anxious on exam.  Data Reviewed: I have personally reviewed following labs and imaging studies  CBC: Recent Labs  Lab 04/19/19 1905 04/20/19 0624  WBC 5.3 5.8  NEUTROABS 4.0  --   HGB 7.2* 8.4*  HCT 25.0* 27.9*  MCV 82.5 82.1  PLT 358 161   Basic Metabolic Panel: Recent Labs  Lab 04/19/19 1905 04/20/19 0624  NA 139 140  K 3.9 4.1  CL 107 107  CO2 24 24  GLUCOSE 106* 94  BUN 35* 28*  CREATININE 1.35* 1.30*  CALCIUM 8.4* 8.3*   GFR: Estimated Creatinine Clearance: 28.4 mL/min (A) (by C-G formula based on SCr of 1.3 mg/dL (H)).   Liver Function Tests: Recent Labs  Lab 04/19/19 1905  AST 16  ALT 11  ALKPHOS 53  BILITOT 0.3  PROT 6.7  ALBUMIN 3.6   Cardiac Enzymes: Recent Labs  Lab 04/19/19 1905  TROPONINI <0.03   CBG: Recent Labs  Lab 04/20/19 0720  GLUCAP 88   Urine analysis:    Component Value Date/Time   COLORURINE YELLOW 04/15/2016 1333   APPEARANCEUR CLEAR 04/15/2016 1333   LABSPEC 1.014 04/15/2016 1333   PHURINE 6.0 04/15/2016 1333   GLUCOSEU NEGATIVE 04/15/2016  1333   HGBUR NEGATIVE 04/15/2016 1333   BILIRUBINUR NEGATIVE 04/15/2016 1333   KETONESUR NEGATIVE 04/15/2016 1333   PROTEINUR NEGATIVE 04/15/2016 1333   NITRITE NEGATIVE 04/15/2016 1333   LEUKOCYTESUR NEGATIVE 04/15/2016 1333   Sepsis Labs: @LABRCNTIP (procalcitonin:4,lacticidven:4)  ) Recent Results (from the past 240 hour(s))  SARS Coronavirus 2 [facility] order, Performed in [place] hospital lab)     Status: None   Collection Time: 04/19/19  6:49 PM  Result Value Ref Range Status   SARS Coronavirus 2 NEGATIVE NEGATIVE Final    Comment: (NOTE) If result is NEGATIVE SARS-CoV-2 target nucleic acids are NOT DETECTED. The SARS-CoV-2 RNA is generally detectable in upper and lower  respiratory specimens during the acute phase of infection. The lowest  concentration of SARS-CoV-2 viral copies this assay can detect is 250  copies / mL. A negative result does not preclude SARS-CoV-2 infection  and should not be used as the sole basis for treatment or other  patient management decisions.  A negative result may occur with  improper specimen collection / handling, submission of specimen other  than nasopharyngeal swab, presence of viral mutation(s) within the  areas  targeted by this assay, and inadequate number of viral copies  (<250 copies / mL). A negative result must be combined with clinical  observations, patient history, and epidemiological information. If result is POSITIVE SARS-CoV-2 target nucleic acids are DETECTED. The SARS-CoV-2 RNA is generally detectable in upper and lower  respiratory specimens dur ing the acute phase of infection.  Positive  results are indicative of active infection with SARS-CoV-2.  Clinical  correlation with patient history and other diagnostic information is  necessary to determine patient infection status.  Positive results do  not rule out bacterial infection or co-infection with other viruses. If result is PRESUMPTIVE POSTIVE SARS-CoV-2 nucleic  acids MAY BE PRESENT.   A presumptive positive result was obtained on the submitted specimen  and confirmed on repeat testing.  While 2019 novel coronavirus  (SARS-CoV-2) nucleic acids may be present in the submitted sample  additional confirmatory testing may be necessary for epidemiological  and / or clinical management purposes  to differentiate between  SARS-CoV-2 and other Sarbecovirus currently known to infect humans.  If clinically indicated additional testing with an alternate test  methodology [phone]) is advised. The SARS-CoV-2 RNA is generally  detectable in upper and lower respiratory sp ecimens during the acute  phase of infection. The expected result is Negative. Fact Sheet for Patients:  StrictlyIdeas.no Fact Sheet for Healthcare Providers: BankingDealers.co.za This test is not yet approved or cleared by the [place] FDA and has been authorized for detection and/or diagnosis of SARS-CoV-2 by FDA under an Emergency Use Authorization (EUA).  This EUA will remain in effect (meaning this test can be used) for the duration of the COVID-19 declaration under Section 564(b)(1) of the Act, 21 U.S.C. section 360bbb-3(b)(1), unless the authorization is terminated or revoked sooner. Performed at [facility], [address] [place], [place] [address]      Radiology Studies: Ct Angio Chest Pe W And/or Wo Contrast  Result Date: 04/19/2019 CLINICAL DATA:  Right middle lobe nodule. 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 EXAM: CT ANGIOGRAPHY CHEST WITH CONTRAST TECHNIQUE: Multidetector CT imaging of the chest was performed using the standard protocol during bolus administration of intravenous contrast. Multiplanar CT image reconstructions and MIPs were obtained to evaluate the vascular anatomy. CONTRAST:  17mL OMNIPAQUE IOHEXOL 350 MG/ML SOLN COMPARISON:  CT chest 04/06/2018 FINDINGS:  Cardiovascular: Extensive atherosclerotic disease in the thoracic aorta. Ascending aortic aneurysm 4.4 x 4.3 cm unchanged. Extensive coronary calcification. Heart size within normal limits. Negative for pulmonary embolism Mediastinum/Nodes: Negative for mass or adenopathy. Lungs/Pleura: Right middle lobe mass 20 x 30 mm with irregular margins and small calcifications. Previously, this measured 2.9 x 2.1 x 1.8 cm and is approximately the same in size. Previous biopsy demonstrated no malignancy and amyloid deposition. Pleuroparenchymal scarring left upper lobe medially and lingula unchanged from the prior study. No acute infiltrate or pleural effusion. Musculoskeletal: Multilevel degenerative change in the thoracic spine. No fracture or bone lesion. Review of the MIP images confirms the above findings. IMPRESSION: 1. Right middle lobe nodule with calcification stable from prior study. 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 2. Atherosclerotic aorta. Ascending aortic aneurysm 4.4 x 4.3 cm, stable Recommend annual imaging followup by CTA or MRA. This recommendation follows 2010 ACCF/AHA/AATS/ACR/ASA/SCA/SCAI/SIR/STS/SVM Guidelines for the Diagnosis and Management of Patients with Thoracic Aortic Disease. Circulation. 2010; 121: C947-S962. Aortic aneurysm NOS (ICD10-I71.9) 3. Scarring left upper lobe and lingula stable. No acute abnormality in the lungs. Electronically  Signed   By: Franchot Gallo M.D.   On: 04/19/2019 21:46   Ct Abdomen Pelvis W Contrast  Result Date: 04/19/2019 CLINICAL DATA:  GI bleed EXAM: CT ABDOMEN AND PELVIS WITH CONTRAST TECHNIQUE: Multidetector CT imaging of the abdomen and pelvis was performed using the standard protocol following bolus administration of intravenous contrast. CONTRAST:  32mL OMNIPAQUE IOHEXOL 350 MG/ML SOLN COMPARISON:  None. FINDINGS: Hepatobiliary: No focal liver abnormality is seen. No gallstones, gallbladder wall thickening, or biliary  dilatation. Pancreas: Negative Spleen: Negative Adrenals/Urinary Tract: Adrenal glands are unremarkable. Kidneys are normal, without renal calculi, focal lesion, or hydronephrosis. Bladder is unremarkable. Stomach/Bowel: Negative for bowel obstruction. Sigmoid diverticulosis without diverticulitis. Diverticula throughout the left colon. No mass or edema in the bowel. Appendix nonvisualized. Vascular/Lymphatic: Extensive atherosclerotic disease in the abdominal aorta. Abdominal aortic aneurysm measuring 3.7 x 3.5 cm in diameter. No evidence of aortic rupture. Atherosclerotic disease in the iliac arteries bilaterally. No worrisome lymphadenopathy. Reproductive: Hysterectomy.  No pelvic mass. Other: Negative for free fluid or free air. Musculoskeletal: Lumbar and thoracic disc degeneration. No acute skeletal abnormality. IMPRESSION: Sigmoid diverticulosis without diverticulitis. Abdominal aortic aneurysm 3.7 x 3.5 cm. Recommend followup by ultrasound in 2 years. This recommendation follows ACR consensus guidelines: White Paper of the ACR Incidental Findings Committee II on Vascular Findings. J Am Coll Radiol 2013; 10:789-794. Aortic aneurysm NOS (ICD10-I71.9) Electronically Signed   By: Franchot Gallo M.D.   On: 04/19/2019 21:33   Dg Chest Port 1 View  Result Date: 04/19/2019 CLINICAL DATA:  Shortness of breath.  Cough. EXAM: PORTABLE CHEST 1 VIEW COMPARISON:  None. FINDINGS: There is a 2 x 3 cm poorly defined density in the right midzone. The lungs are otherwise clear. Heart size and vascularity are normal. Aortic atherosclerosis. Slight apical pleural thickening on the left. No acute bone abnormalities. Arthritic changes at the left shoulder. Old healed left lateral rib fracture. IMPRESSION: 1. Ill-defined density in the right midzone. This could represent a focal infiltrate or a mass. 2.  Aortic Atherosclerosis (ICD10-I70.0). Electronically Signed   By: Lorriane Shire M.D.   On: 04/19/2019 19:23   Scheduled  Meds: . sodium chloride   Intravenous Once  . lidocaine      . meperidine      . midazolam      . pantoprazole (PROTONIX) IV  40 mg Intravenous Q12H  . sodium chloride flush  3 mL Intravenous Q12H  . sodium chloride flush  3 mL Intravenous Q12H  . sucralfate  1 g Oral TID WC & HS   Continuous Infusions: . sodium chloride       LOS: 0 days    Time spent: 30 minutes.   Barton Dubois, MD [facility] Pager [phone]  04/20/2019, 7:01 PM

## 2019-04-20 NOTE — Op Note (Signed)
[facility] Patient Name: Leslie Carr Procedure Date: 04/20/2019 2:31 PM MRN: [id number] Date of Birth: 1931/07/23 Attending MD: Hildred Laser , MD CSN: [id number] Age: 85 Admit Type: Outpatient Procedure:                Upper GI endoscopy Indications:              Gastrointestinal bleeding of unknown origin;                            patient unable to swall Given capsule. Providers:                Hildred Laser, MD, Charlsie Quest Theda Sers RN, RN,                            Raphael Gibney, Technician, Aram Candela Referring MD:             Barton Dubois, MD Medicines:                Lidocaine spray, Meperidine 30 mg IV, Midazolam 3                            mg IV Complications:            No immediate complications. Estimated Blood Loss:     Estimated blood loss: none. Procedure:                Pre-Anesthesia Assessment:                           - Prior to the procedure, a History and Physical                            was performed, and patient medications and                            allergies were reviewed. The patient's tolerance of                            previous anesthesia was also reviewed. The risks                            and benefits of the procedure and the sedation                            options and risks were discussed with the patient.                            All questions were answered, and informed consent                            was obtained. Prior Anticoagulants: The patient                            last took Plavix (clopidogrel) 2 days prior to the  procedure. ASA Grade Assessment: III - A patient                            with severe systemic disease. After reviewing the                            risks and benefits, the patient was deemed in                            satisfactory condition to undergo the procedure.                           After obtaining informed consent, the endoscope was            passed under direct vision. Throughout the                            procedure, the patient's blood pressure, pulse, and                            oxygen saturations were monitored continuously. The                            [id number]-[id number] ([id number]) scope was introduced through the                            mouth, and advanced to the second part of duodenum.                            The upper GI endoscopy was accomplished without                            difficulty. The patient tolerated the procedure                            well. Scope In: 3:35:55 PM Scope Out: 0:24:09 PM Total Procedure Duration: 0 hours 10 minutes 46 seconds  Findings:      One superficial esophageal ulcer with oozing blood was found 35 cm from       the incisors. The lesion was 6 mm in largest dimension. Coagulation for       hemostasis using argon plasma was successful.      The Z-line was regular and was found 35 cm from the incisors.      A 3 cm hiatal hernia was present.      Two non-bleeding cratered gastric ulcers with no stigmata of bleeding       were found in the prepyloric region of the stomach. The largest lesion       was 5 mm in largest dimension.      The exam of the stomach was otherwise normal.      The duodenal bulb and second portion of the duodenum were normal. Impression:               - Bleeding esophageal ulcer. Treated with argon  plasma coagulation (APC).                           - Z-line regular, 35 cm from the incisors.                           - 3 cm hiatal hernia.                           - Non-bleeding gastric ulcers with no stigmata of                            bleeding.                           - Normal duodenal bulb and second portion of the                            duodenum.                           - No specimens collected.                           Comment:Given capsule not placed since bleeding                            source  identified. Moderate Sedation:      Moderate (conscious) sedation was administered by the endoscopy nurse       and supervised by the endoscopist. The following parameters were       monitored: oxygen saturation, heart rate, blood pressure, CO2       capnography and response to care. Total physician intraservice time was       16 minutes. Recommendation:           - Return patient to hospital ward for ongoing care.                           - Full liquid diet today.                           - Continue present medications.                           - H. Pylori serology.                           - Sucralfate 1 g po qid.                           - Hold antiplatet therapy for 72 hours.                           - Continue IV PPI for now. Procedure Code(s):        --- Professional ---  43255, Esophagogastroduodenoscopy, flexible,                            transoral; with control of bleeding, any method                           G0500, Moderate sedation services provided by the                            same physician or other qualified health care                            professional performing a gastrointestinal                            endoscopic service that sedation supports,                            requiring the presence of an independent trained                            observer to assist in the monitoring of the                            patient's level of consciousness and physiological                            status; initial 15 minutes of intra-service time;                            patient age 72 years or older (additional time may                            be reported with [phone], as appropriate) Diagnosis Code(s):        --- Professional ---                           K22.11, Ulcer of esophagus with bleeding                           K44.9, Diaphragmatic hernia without obstruction or                            gangrene                            K25.9, Gastric ulcer, unspecified as acute or                            chronic, without hemorrhage or perforation                           K92.2, Gastrointestinal hemorrhage, unspecified CPT copyright 2019 American Medical Association. All rights reserved. The codes documented in this report are preliminary and upon coder review may  be revised to meet current  compliance requirements. Hildred Laser, MD Hildred Laser, MD 04/20/2019 4:02:21 PM This report has been signed electronically. Number of Addenda: 0

## 2019-04-20 NOTE — Consult Note (Signed)
Reason for Consult: anemia Referring Physician: Hospitalist.   Leslie Carr is an 85 y.o. female.  HPI: [place] hx significant CHF, SOB, CVA, Breast cancer, Cardiac stents. Admitted thru the ED yesterday. SOB x 2 weeks. Had been having deep marooned colored stools over the past 2 weeks but not everyday. No prior hx of this. States has no appetite, ? Weight loss, though she admits she has been depressed since her husband died. Has had 3 colonoscopies in the past. Last colonoscopy in 1997.  She does occasionally take 2 ASA to calm her lower extremities down. She denies any abdominal pain. No nausea or vomiting. No GERD. On admission hemoglobin was down 7.2. Normal back in [place] of this year.  Underwent a CT scan yesterday which revealed sigmoid diverticulosis without diverticulitis. She is maintained on Plavix for her cardiac stent and also has hx of PAF.  Has not had a stool since admission.   Past Medical History:  Diagnosis Date  . Breast cancer ([place])   . CHF (congestive heart failure) ([place])   . Heart murmur   . History of bronchitis   . Hypercholesteremia   . Numbness and tingling in left hand   . Pleural effusion on left   . Shortness of breath dyspnea    "only going up a flight of stairs"  . Stroke [facility]) 2014   left sided weakness - mainly left hand    Past Surgical History:  Procedure Laterality Date  . ABDOMINAL HYSTERECTOMY  1976  . CATARACT EXTRACTION Right   . COLONOSCOPY    . CORONARY ATHERECTOMY N/A 09/24/2018   Procedure: CORONARY ATHERECTOMY;  Surgeon: Leonie Man, MD;  Location: [place] [facility];  Service: Cardiovascular;  Laterality: N/A;  . CORONARY STENT INTERVENTION N/A 09/21/2018   Procedure: CORONARY STENT INTERVENTION;  Surgeon: Leonie Man, MD;  Location: [place] [facility];  Service: Cardiovascular;  Laterality: N/A;  . DECORTICATION Left 04/17/2016   Procedure: Visceral Pleural DECORTICATION;  Surgeon: Melrose Nakayama, MD;  Location:  [place];  Service: Thoracic;  Laterality: Left;  . FOOT SURGERY Right    "bone spur removal"  . LEFT HEART CATH AND CORONARY ANGIOGRAPHY N/A 09/21/2018   Procedure: LEFT HEART CATH AND CORONARY ANGIOGRAPHY;  Surgeon: Leonie Man, MD;  Location: [place] [facility];  Service: Cardiovascular;  Laterality: N/A;  . MASTECTOMY Left 1985  . PLEURAL BIOPSY Left 04/17/2016   Procedure:  Left PLEURAL BIOPSY;  Surgeon: Melrose Nakayama, MD;  Location: [place];  Service: Thoracic;  Laterality: Left;  . PLEURAL EFFUSION DRAINAGE Left 04/17/2016   Procedure: DRAINAGE OF Left PLEURAL EFFUSION;  Surgeon: Melrose Nakayama, MD;  Location: [place];  Service: Thoracic;  Laterality: Left;  . TONSILLECTOMY    . VIDEO ASSISTED THORACOSCOPY Left 04/17/2016   Procedure:  Left VIDEO ASSISTED THORACOSCOPY with Drainage of Pleural Effusion, Pleural Biopsy, Visceral Pleural Decortication and Placement of On-Q pain pump;  Surgeon: Melrose Nakayama, MD;  Location: [facility] OR;  Service: Thoracic;  Laterality: Left;    Family History  Problem Relation Age of Onset  . Hypertension Mother   . Hypertension Father   . Heart disease Neg Hx   . Heart attack Neg Hx   . Hyperlipidemia Neg Hx   . Heart failure Neg Hx     Social History:  reports that she has quit smoking. She has never used smokeless tobacco. She reports that she does not drink alcohol or use drugs.  Allergies:  Allergies  Allergen Reactions  . Lipitor [Atorvastatin] Other (See Comments)    Dizzy and make her head feel huge     Medications: I have reviewed the patient's current medications.  Results for orders placed or performed during the hospital encounter of 04/19/19 (from the past 48 hour(s))  SARS Coronavirus 2 [facility] order, Performed in [facility] hospital lab)     Status: None   Collection Time: 04/19/19  6:49 PM  Result Value Ref Range   SARS Coronavirus 2 NEGATIVE NEGATIVE    Comment: (NOTE) If result is NEGATIVE SARS-CoV-2 target  nucleic acids are NOT DETECTED. The SARS-CoV-2 RNA is generally detectable in upper and lower  respiratory specimens during the acute phase of infection. The lowest  concentration of SARS-CoV-2 viral copies this assay can detect is 250  copies / mL. A negative result does not preclude SARS-CoV-2 infection  and should not be used as the sole basis for treatment or other  patient management decisions.  A negative result may occur with  improper specimen collection / handling, submission of specimen other  than nasopharyngeal swab, presence of viral mutation(s) within the  areas targeted by this assay, and inadequate number of viral copies  (<250 copies / mL). A negative result must be combined with clinical  observations, patient history, and epidemiological information. If result is POSITIVE SARS-CoV-2 target nucleic acids are DETECTED. The SARS-CoV-2 RNA is generally detectable in upper and lower  respiratory specimens dur ing the acute phase of infection.  Positive  results are indicative of active infection with SARS-CoV-2.  Clinical  correlation with patient history and other diagnostic information is  necessary to determine patient infection status.  Positive results do  not rule out bacterial infection or co-infection with other viruses. If result is PRESUMPTIVE POSTIVE SARS-CoV-2 nucleic acids MAY BE PRESENT.   A presumptive positive result was obtained on the submitted specimen  and confirmed on repeat testing.  While 2019 novel coronavirus  (SARS-CoV-2) nucleic acids may be present in the submitted sample  additional confirmatory testing may be necessary for epidemiological  and / or clinical management purposes  to differentiate between  SARS-CoV-2 and other Sarbecovirus currently known to infect humans.  If clinically indicated additional testing with an alternate test  methodology [phone]) is advised. The SARS-CoV-2 RNA is generally  detectable in upper and lower  respiratory sp ecimens during the acute  phase of infection. The expected result is Negative. Fact Sheet for Patients:  StrictlyIdeas.no Fact Sheet for Healthcare Providers: BankingDealers.co.za This test is not yet approved or cleared by the [place] FDA and has been authorized for detection and/or diagnosis of SARS-CoV-2 by FDA under an Emergency Use Authorization (EUA).  This EUA will remain in effect (meaning this test can be used) for the duration of the COVID-19 declaration under Section 564(b)(1) of the Act, 21 U.S.C. section 360bbb-3(b)(1), unless the authorization is terminated or revoked sooner. Performed at [facility], [address] [employer], [place] [address]   CBC with Differential/Platelet     Status: Abnormal   Collection Time: 04/19/19  7:05 PM  Result Value Ref Range   WBC 5.3 4.0 - 10.5 K/uL   RBC 3.03 (L) 3.87 - 5.11 MIL/uL   Hemoglobin 7.2 (L) 12.0 - 15.0 g/dL   HCT 25.0 (L) 36.0 - 46.0 %   MCV 82.5 80.0 - 100.0 fL   MCH 23.8 (L) 26.0 - 34.0 pg   MCHC 28.8 (L) 30.0 - 36.0 g/dL  RDW 14.9 11.5 - 15.5 %   Platelets 358 150 - 400 K/uL   nRBC 0.0 0.0 - 0.2 %   Neutrophils Relative % 74 %   Neutro Abs 4.0 1.7 - 7.7 K/uL   Lymphocytes Relative 9 %   Lymphs Abs 0.5 (L) 0.7 - 4.0 K/uL   Monocytes Relative 11 %   Monocytes Absolute 0.6 0.1 - 1.0 K/uL   Eosinophils Relative 5 %   Eosinophils Absolute 0.3 0.0 - 0.5 K/uL   Basophils Relative 1 %   Basophils Absolute 0.0 0.0 - 0.1 K/uL   Immature Granulocytes 0 %   Abs Immature Granulocytes 0.02 0.00 - 0.07 K/uL    Comment: Performed at [facility], [address] [place], [place] [address]  Comprehensive metabolic panel     Status: Abnormal   Collection Time: 04/19/19  7:05 PM  Result Value Ref Range   Sodium 139 135 - 145 mmol/L   Potassium 3.9 3.5 - 5.1 mmol/L   Chloride 107 98 - 111 mmol/L   CO2 24 22 - 32 mmol/L   Glucose, Bld 106 (H) 70 - 99 mg/dL    BUN 35 (H) 8 - 23 mg/dL   Creatinine, Ser 1.35 (H) 0.44 - 1.00 mg/dL   Calcium 8.4 (L) 8.9 - 10.3 mg/dL   Total Protein 6.7 6.5 - 8.1 g/dL   Albumin 3.6 3.5 - 5.0 g/dL   AST 16 15 - 41 U/L   ALT 11 0 - 44 U/L   Alkaline Phosphatase 53 38 - 126 U/L   Total Bilirubin 0.3 0.3 - 1.2 mg/dL   GFR calc non Af Amer 36 (L) >60 mL/min   GFR calc Af Amer 41 (L) >60 mL/min   Anion gap 8 5 - 15    Comment: Performed at [facility], [address], [place], [place] [address]  Troponin I - ONCE - STAT     Status: None   Collection Time: 04/19/19  7:05 PM  Result Value Ref Range   Troponin I <0.03 <0.03 ng/mL    Comment: Performed at [facility], [address] [place], [place] [address]  Brain natriuretic peptide     Status: Abnormal   Collection Time: 04/19/19  7:05 PM  Result Value Ref Range   B Natriuretic Peptide 211.0 (H) 0.0 - 100.0 pg/mL    Comment: Performed at [facility], [address], [place], [place] [address]  D-dimer, quantitative (not at [facility])     Status: Abnormal   Collection Time: 04/19/19  7:05 PM  Result Value Ref Range   D-Dimer, Quant 3.48 (H) 0.00 - 0.50 ug/mL-FEU    Comment: (NOTE) At the manufacturer cut-off of 0.50 ug/mL FEU, this assay has been documented to exclude PE with a sensitivity and negative predictive value of 97 to 99%.  At this time, this assay has not been approved by the FDA to exclude DVT/VTE. Results should be correlated with clinical presentation. Performed at [facility], [address] [place], [place] [address]   POC occult blood, ED     Status: Abnormal   Collection Time: 04/19/19  7:57 PM  Result Value Ref Range   Fecal Occult Bld POSITIVE (A) NEGATIVE  ABO/Rh     Status: None   Collection Time: 04/19/19  8:29 PM  Result Value Ref Range   ABO/RH(D)      B POS Performed at [facility], [address] [place], [place] [address]   Type and screen [facility]  Status: None (Preliminary result)   Collection Time: 04/19/19   8:42 PM  Result Value Ref Range   ABO/RH(D) B POS    Antibody Screen NEG    Sample Expiration 04/22/2019    Unit Number [id number]    Blood Component Type RED CELLS,LR    Unit division 00    Status of Unit ISSUED    Transfusion Status OK TO TRANSFUSE    Crossmatch Result      Compatible Performed at [facility], [address], [place], [place] [address]   Prepare RBC     Status: None   Collection Time: 04/19/19  8:42 PM  Result Value Ref Range   Order Confirmation      ORDER PROCESSED BY BLOOD BANK Performed at [facility], [address], [place], [place] [address]   Basic metabolic panel     Status: Abnormal   Collection Time: 04/20/19  6:24 AM  Result Value Ref Range   Sodium 140 135 - 145 mmol/L   Potassium 4.1 3.5 - 5.1 mmol/L   Chloride 107 98 - 111 mmol/L   CO2 24 22 - 32 mmol/L   Glucose, Bld 94 70 - 99 mg/dL   BUN 28 (H) 8 - 23 mg/dL   Creatinine, Ser 1.30 (H) 0.44 - 1.00 mg/dL   Calcium 8.3 (L) 8.9 - 10.3 mg/dL   GFR calc non Af Amer 37 (L) >60 mL/min   GFR calc Af Amer 43 (L) >60 mL/min   Anion gap 9 5 - 15    Comment: Performed at [facility], [address] [place], [place] [address]  CBC     Status: Abnormal   Collection Time: 04/20/19  6:24 AM  Result Value Ref Range   WBC 5.8 4.0 - 10.5 K/uL   RBC 3.40 (L) 3.87 - 5.11 MIL/uL   Hemoglobin 8.4 (L) 12.0 - 15.0 g/dL   HCT 27.9 (L) 36.0 - 46.0 %   MCV 82.1 80.0 - 100.0 fL   MCH 24.7 (L) 26.0 - 34.0 pg   MCHC 30.1 30.0 - 36.0 g/dL   RDW 14.5 11.5 - 15.5 %   Platelets 320 150 - 400 K/uL   nRBC 0.0 0.0 - 0.2 %    Comment: Performed at [facility], [address], [place], [place] [address]  Glucose, capillary     Status: None   Collection Time: 04/20/19  7:20 AM  Result Value Ref Range   Glucose-Capillary 88 70 - 99 mg/dL    Ct Angio Chest Pe W And/or Wo Contrast  Result Date: 04/19/2019 CLINICAL DATA:  Right middle lobe nodule. This has been biopsied 04/27/2018 showing no malignancy and  suggestive of amyloidoma. EXAM: CT ANGIOGRAPHY CHEST WITH CONTRAST TECHNIQUE: Multidetector CT imaging of the chest was performed using the standard protocol during bolus administration of intravenous contrast. Multiplanar CT image reconstructions and MIPs were obtained to evaluate the vascular anatomy. CONTRAST:  35mL OMNIPAQUE IOHEXOL 350 MG/ML SOLN COMPARISON:  CT chest 04/06/2018 FINDINGS: Cardiovascular: Extensive atherosclerotic disease in the thoracic aorta. Ascending aortic aneurysm 4.4 x 4.3 cm unchanged. Extensive coronary calcification. Heart size within normal limits. Negative for pulmonary embolism Mediastinum/Nodes: Negative for mass or adenopathy. Lungs/Pleura: Right middle lobe mass 20 x 30 mm with irregular margins and small calcifications. Previously, this measured 2.9 x 2.1 x 1.8 cm and is approximately the same in size. Previous biopsy demonstrated no malignancy and amyloid deposition. Pleuroparenchymal scarring left upper lobe medially and lingula unchanged from the prior study.  No acute infiltrate or pleural effusion. Musculoskeletal: Multilevel degenerative change in the thoracic spine. No fracture or bone lesion. Review of the MIP images confirms the above findings. IMPRESSION: 1. Right middle lobe nodule with calcification stable from prior study. 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 2. Atherosclerotic aorta. Ascending aortic aneurysm 4.4 x 4.3 cm, stable Recommend annual imaging followup by CTA or MRA. This recommendation follows 2010 ACCF/AHA/AATS/ACR/ASA/SCA/SCAI/SIR/STS/SVM Guidelines for the Diagnosis and Management of Patients with Thoracic Aortic Disease. Circulation. 2010; 121: P509-T267. Aortic aneurysm NOS (ICD10-I71.9) 3. Scarring left upper lobe and lingula stable. No acute abnormality in the lungs. Electronically Signed   By: Franchot Gallo M.D.   On: 04/19/2019 21:46   Ct Abdomen Pelvis W Contrast  Result Date: 04/19/2019 CLINICAL DATA:  GI  bleed EXAM: CT ABDOMEN AND PELVIS WITH CONTRAST TECHNIQUE: Multidetector CT imaging of the abdomen and pelvis was performed using the standard protocol following bolus administration of intravenous contrast. CONTRAST:  81mL OMNIPAQUE IOHEXOL 350 MG/ML SOLN COMPARISON:  None. FINDINGS: Hepatobiliary: No focal liver abnormality is seen. No gallstones, gallbladder wall thickening, or biliary dilatation. Pancreas: Negative Spleen: Negative Adrenals/Urinary Tract: Adrenal glands are unremarkable. Kidneys are normal, without renal calculi, focal lesion, or hydronephrosis. Bladder is unremarkable. Stomach/Bowel: Negative for bowel obstruction. Sigmoid diverticulosis without diverticulitis. Diverticula throughout the left colon. No mass or edema in the bowel. Appendix nonvisualized. Vascular/Lymphatic: Extensive atherosclerotic disease in the abdominal aorta. Abdominal aortic aneurysm measuring 3.7 x 3.5 cm in diameter. No evidence of aortic rupture. Atherosclerotic disease in the iliac arteries bilaterally. No worrisome lymphadenopathy. Reproductive: Hysterectomy.  No pelvic mass. Other: Negative for free fluid or free air. Musculoskeletal: Lumbar and thoracic disc degeneration. No acute skeletal abnormality. IMPRESSION: Sigmoid diverticulosis without diverticulitis. Abdominal aortic aneurysm 3.7 x 3.5 cm. Recommend followup by ultrasound in 2 years. This recommendation follows ACR consensus guidelines: White Paper of the ACR Incidental Findings Committee II on Vascular Findings. J Am Coll Radiol 2013; 10:789-794. Aortic aneurysm NOS (ICD10-I71.9) Electronically Signed   By: Franchot Gallo M.D.   On: 04/19/2019 21:33   Dg Chest Port 1 View  Result Date: 04/19/2019 CLINICAL DATA:  Shortness of breath.  Cough. EXAM: PORTABLE CHEST 1 VIEW COMPARISON:  None. FINDINGS: There is a 2 x 3 cm poorly defined density in the right midzone. The lungs are otherwise clear. Heart size and vascularity are normal. Aortic  atherosclerosis. Slight apical pleural thickening on the left. No acute bone abnormalities. Arthritic changes at the left shoulder. Old healed left lateral rib fracture. IMPRESSION: 1. Ill-defined density in the right midzone. This could represent a focal infiltrate or a mass. 2.  Aortic Atherosclerosis (ICD10-I70.0). Electronically Signed   By: Lorriane Shire M.D.   On: 04/19/2019 19:23    ROS Blood pressure (!) 155/69, pulse 88, temperature 98.9 F (37.2 C), temperature source Oral, resp. rate 20, height 5\' 3"  (1.6 m), weight 63.6 kg, SpO2 97 %. Physical Exam     Alert and oriented. Skin warm and dry. Oral mucosa is moist.   . Sclera anicteric, conjunctivae is pink. Thyroid not enlarged. No cervical lymphadenopathy. Lungs clear. Heart regular rate and rhythm. Loud murmur hear.   Abdomen is soft. Bowel sounds are positive. No hepatomegaly. No abdominal masses felt. No tenderness.  No edema to lower extremities. Foley draining clear urine.  Assessment/ Plan. Possible diverticular bleed. Her hemoglobin has increased to 8.4 from 7.2.  Will discuss with Dr. Laural Golden.  [place] Jenny L Andriy Sherk 04/20/2019, 9:21 AM

## 2019-04-20 NOTE — Progress Notes (Addendum)
Brief EGD note.  Small ulcer proximal to GE junction with active bleeding/oozing. Hemostasis obtained with APC therapy. 3 cm size hiatal hernia. Two small prepyloric gastric ulcers without stigmata of bleed. Normal D1 and D2.  Since bleeding lesion identified given capsule not placed.

## 2019-04-20 NOTE — Progress Notes (Signed)
Patient unable to swallow given capsule. 4 we will proceed with EGD and endoscopic placement of given capsule unless bleeding source identified on EGD.   Discussed with patient's daughter Santiago Glad. Patient and her daughter are agreeable.

## 2019-04-20 NOTE — OR Nursing (Signed)
Patient scheduled for a Givens capsule study. Patient attempted to swallow the capsule multiple times and was unsuccessful. Dr. Laural Golden notified and said he will proceed with a EGD with Givens capsule placement.

## 2019-04-21 ENCOUNTER — Encounter (HOSPITAL_COMMUNITY): Payer: Self-pay | Admitting: Internal Medicine

## 2019-04-21 LAB — CBC
HCT: 34.1 % — ABNORMAL LOW (ref 36.0–46.0)
Hemoglobin: 10.3 g/dL — ABNORMAL LOW (ref 12.0–15.0)
MCH: 25.4 pg — ABNORMAL LOW (ref 26.0–34.0)
MCHC: 30.2 g/dL (ref 30.0–36.0)
MCV: 84 fL (ref 80.0–100.0)
Platelets: 336 10*3/uL (ref 150–400)
RBC: 4.06 MIL/uL (ref 3.87–5.11)
RDW: 14.7 % (ref 11.5–15.5)
WBC: 5.6 10*3/uL (ref 4.0–10.5)
nRBC: 0 % (ref 0.0–0.2)

## 2019-04-21 LAB — TYPE AND SCREEN
ABO/RH(D): B POS
Antibody Screen: NEGATIVE
Unit division: 0
Unit division: 0

## 2019-04-21 LAB — BPAM RBC
Blood Product Expiration Date: 202005132359
Blood Product Expiration Date: 202005142359
ISSUE DATE / TIME: 202004290109
ISSUE DATE / TIME: 202004291249
Unit Type and Rh: 1700
Unit Type and Rh: 1700

## 2019-04-21 LAB — GLUCOSE, CAPILLARY: Glucose-Capillary: 93 mg/dL (ref 70–99)

## 2019-04-21 MED ORDER — PANTOPRAZOLE SODIUM 40 MG PO TBEC
40.0000 mg | DELAYED_RELEASE_TABLET | Freq: Two times a day (BID) | ORAL | Status: DC
  Administered 2019-04-22: 40 mg via ORAL
  Filled 2019-04-21: qty 1

## 2019-04-21 MED ORDER — BISACODYL 10 MG RE SUPP
10.0000 mg | Freq: Every day | RECTAL | Status: DC | PRN
  Administered 2019-04-21: 19:00:00 10 mg via RECTAL
  Filled 2019-04-21: qty 1

## 2019-04-21 NOTE — Progress Notes (Signed)
  Subjective:  Patient has no complaints other than sporadic epigastric cramp which occurs if she moves in certain way.  She states she has had this before.  She denies nausea vomiting heartburn dysphagia or abdominal pain.  She has been passing flatus but has not had a bowel movement in the last 2 days.  Objective: Blood pressure 133/69, pulse 69, temperature 98.3 F (36.8 C), temperature source Oral, resp. rate 16, height _0  (1.6 m), weight 63.6 kg, SpO2 96 %. Patient is alert and in no acute distress. Systolic murmur is not as loud as it was yesterday.  Its most pronounced at upper left sternal border. Abdomen is symmetrical.  It is soft and nontender with organomegaly or masses.  Labs/studies Results:  CBC Latest Ref Rng & Units 04/21/2019 04/20/2019 04/19/2019  WBC 4.0 - 10.5 K/uL 5.6 5.8 5.3  Hemoglobin 12.0 - 15.0 g/dL 10.3(L) 8.4(L) 7.2(L)  Hematocrit 36.0 - 46.0 % 34.1(L) 27.9(L) 25.0(L)  Platelets 150 - 400 K/uL 336 320 358    CMP Latest Ref Rng & Units 04/20/2019 04/19/2019 01/25/2019  Glucose 70 - 99 mg/dL 94 106(H) 66  BUN 8 - 23 mg/dL 28(H) 35(H) 17  Creatinine 0.44 - 1.00 mg/dL 1.30(H) 1.35(H) 1.18(H)  Sodium 135 - 145 mmol/L 140 139 143  Potassium 3.5 - 5.1 mmol/L 4.1 3.9 4.2  Chloride 98 - 111 mmol/L 107 107 104  CO2 22 - 32 mmol/L _1 Calcium 8.9 - 10.3 mg/dL 8.3(L) 8.4(L) 8.8  Total Protein 6.5 - 8.1 g/dL - 6.7 -  Total Bilirubin 0.3 - 1.2 mg/dL - 0.3 -  Alkaline Phos 38 - 126 U/L - 53 -  AST 15 - 41 U/L - 16 -  ALT 0 - 44 U/L - 11 -    Hepatic Function Latest Ref Rng & Units 04/19/2019 04/19/2016 04/15/2016  Total Protein 6.5 - 8.1 g/dL 6.7 4.2(L) 6.6  Albumin 3.5 - 5.0 g/dL 3.6 2.0(L) 3.4(L)  AST 15 - 41 U/L _2 ALT 0 - 44 U/L 11 14 12(L)  Alk Phosphatase 38 - 126 U/L 53 42 71  Total Bilirubin 0.3 - 1.2 mg/dL 0.3 0.5 0.3    H. pylori serology is pending.  Assessment:  #1.  Upper GI bleed secondary to distal esophageal ulcer status post APC  coagulation yesterday with hemostasis.  No evidence of active bleeding.  Etiology could be GERD or pill esophagitis.  #2.  Anemia secondary to upper GI bleed.  Patient has received 2 units of PRBCs and hemoglobin is up to 10.3 g.  #3.  Peptic ulcer disease.  Patient was found to have 2 prepyloric ulcers on EGD yesterday.  Both of these ulcers are clean base.  Suspect his ulcers are secondary to aspirin.  H. pylori serology is pending.  #4.  Antiplatelet therapy on hold because of GI bleed.   Recommendations:  Continue IV pantoprazole today. She can be transitioned to oral pantoprazole at a dose of 40 mg twice daily prior to discharge tomorrow. She should continue sucralfate 1 g 4 times a day for total of 2 weeks. CBC and metabolic 7 in a.m. Aspirin and clopidogrel can be resumed on 04/23/2019.

## 2019-04-21 NOTE — Progress Notes (Signed)
PROGRESS NOTE    Leslie Carr  LTJ:[id number] DOB: 07/01/1931 DOA: 04/19/2019 PCP: [name]     Brief Narrative:  85 y.o. female with medical history significant for coronary artery disease, history of stroke, history of breast cancer, and chronic combined systolic and diastolic CHF, now presenting to the emergency department for evaluation of shortness of breath.  The patient reports approximately 3 weeks of worsening dyspnea with exertion.  She walks 1 to 1.5 miles every day and began to notice shortness of breath where she had previously tolerated this well.  Exertional dyspnea has progressively worsened.  She has had occasional upper abdominal pain over this interval that has been mild and not bothering her much.  She had an episode of maroon stool approximately a week ago, but thought that her bowel movements have been fairly normal since then.  She denies any indigestion, vomiting, or diarrhea.  Reports that she has had 3 colonoscopies previously and did not remember hearing of any abnormalities.  She does not believe that she is ever had an upper endoscopy, denies abdominal pain or vomiting, and denies any alcohol use.  Of note, patient expressed use on and off extra aspirin to assist with what appears to be restless leg syndrome.  Assessment & Plan: 1-symptomatic anemia: In the setting of acute blood loss anemia. -Hemodynamically stable. -Endoscopy demonstrated distal esophageal ulcer with active bleeding/oozing (status post APC), there was also to a small prepyloric gastric ulcer without stigmata of bleeding.  Positive hiatal hernia. -Continue Carafate and IV PPI. -Status post 2 units PRBCs during this hospitalization; hemoglobin 10.4. -Follow hemoglobin trend -Follow GI service recommendations. -Continue holding aspirin and Plavix -Patient instructed to avoid the use of NSAIDs.  2-coronary artery disease -Patient denies chest pains -Continue holding  antiplatelets medication in the setting of acute GI bleed -Once truly tolerating p.o.'s we will resume statins.  3-Status post CVA -There has not been complaints of acute focal deficits -Holding aspirin and Plavix in the setting of acute GI bleed -Once truly able to tolerate by mouth we will resume statins therapy.  4-Chronic combined systolic (congestive) and diastolic (congestive) heart failure (HCC) -Compensated and is stable -Follow daily weights and I's and O's. -Patient denies shortness of breath.  5-AAA (abdominal aortic aneurysm) (HCC) -Follow-up with ultrasound in 2 years as recommended -There is no signs of acute rupture  6-CKD (chronic kidney disease), stage III (HCC) -Serum creatinine 1.35 on admission which is similar to prior creatinine level on her records -Advised to maintain adequate oral hydration -Follow renal function trend.  DVT prophylaxis: SCDs Code Status: Full code Family Communication: Daughter updated over the phone Disposition Plan: Remains in the hospital, continue advancing diet as recommended by GI.  Continue IV PPI for the next 24 hours.  Continue Carafate.  Hemoglobin is stabilized after 2 units PRBCs.  Continue to follow trend.  Consultants:   Gastroenterology  Procedures:   See below for x-ray reports.  EGD: 04/20/2019; demonstrating distal esophageal ulcer (with active bleeding/oozing), to a small prepyloric gastric ulcer without stigmata of bleeding; 3 cm hiatal hernia  Antimicrobials:  Anti-infectives (From admission, onward)   None      Subjective: No chest pain, no nausea, no vomiting, no shortness of breath.  Patient reports intermittent episodes of epigastric pain.  No signs of bleeding reported.  Objective: Vitals:   04/20/19 2114 04/21/19 0538 04/21/19 0808 04/21/19 1300  BP: 130/75 133/69  139/71  Pulse: 77 69  81  Resp: 17 16  18   Temp: 98.2 F (36.8 C) 98.3 F (36.8 C)  98.1 F (36.7 C)  TempSrc: Oral Oral  Oral   SpO2: 97% 97% 96% 97%  Weight:      Height:        Intake/Output Summary (Last 24 hours) at 04/21/2019 1550 Last data filed at 04/21/2019 1300 Gross per 24 hour  Intake 1310 ml  Output 1900 ml  Net -590 ml   Filed Weights   04/19/19 1822 04/20/19 0500  Weight: 63.5 kg 63.6 kg    Examination: General exam: Alert, awake, oriented x 3; reports an intermittent episode of epigastric pain.  No nausea, no vomiting.  No episodes of bloody stools or hematemesis. Respiratory system: Clear to auscultation. Respiratory effort normal. Cardiovascular system:RRR. No murmurs, rubs, gallops. Gastrointestinal system: Abdomen is nondistended, soft and nontender. No organomegaly or masses felt. Normal bowel sounds heard. Central nervous system: Alert and oriented. No focal neurological deficits. Extremities: No C/C/E, +pedal pulses Skin: No rashes, lesions or ulcers Psychiatry: Judgement and insight appear normal. Mood & affect appropriate.   Data Reviewed: I have personally reviewed following labs and imaging studies  CBC: Recent Labs  Lab 04/19/19 1905 04/20/19 0624 04/21/19 0505  WBC 5.3 5.8 5.6  NEUTROABS 4.0  --   --   HGB 7.2* 8.4* 10.3*  HCT 25.0* 27.9* 34.1*  MCV 82.5 82.1 84.0  PLT 358 320 119   Basic Metabolic Panel: Recent Labs  Lab 04/19/19 1905 04/20/19 0624  NA 139 140  K 3.9 4.1  CL 107 107  CO2 24 24  GLUCOSE 106* 94  BUN 35* 28*  CREATININE 1.35* 1.30*  CALCIUM 8.4* 8.3*   GFR: Estimated Creatinine Clearance: 28.4 mL/min (A) (by C-G formula based on SCr of 1.3 mg/dL (H)).   Liver Function Tests: Recent Labs  Lab 04/19/19 1905  AST 16  ALT 11  ALKPHOS 53  BILITOT 0.3  PROT 6.7  ALBUMIN 3.6   Cardiac Enzymes: Recent Labs  Lab 04/19/19 1905  TROPONINI <0.03   CBG: Recent Labs  Lab 04/20/19 0720 04/21/19 0758  GLUCAP 88 93   Urine analysis:    Component Value Date/Time   COLORURINE YELLOW 04/15/2016 [place] 04/15/2016  1333   LABSPEC 1.014 04/15/2016 1333   PHURINE 6.0 04/15/2016 1333   GLUCOSEU NEGATIVE 04/15/2016 1333   HGBUR NEGATIVE 04/15/2016 1333   BILIRUBINUR NEGATIVE 04/15/2016 1333   KETONESUR NEGATIVE 04/15/2016 1333   PROTEINUR NEGATIVE 04/15/2016 1333   NITRITE NEGATIVE 04/15/2016 1333   LEUKOCYTESUR NEGATIVE 04/15/2016 1333    Recent Results (from the past 240 hour(s))  SARS Coronavirus 2 [facility] order, Performed in [place] hospital lab)     Status: None   Collection Time: 04/19/19  6:49 PM  Result Value Ref Range Status   SARS Coronavirus 2 NEGATIVE NEGATIVE Final    Comment: (NOTE) If result is NEGATIVE SARS-CoV-2 target nucleic acids are NOT DETECTED. The SARS-CoV-2 RNA is generally detectable in upper and lower  respiratory specimens during the acute phase of infection. The lowest  concentration of SARS-CoV-2 viral copies this assay can detect is 250  copies / mL. A negative result does not preclude SARS-CoV-2 infection  and should not be used as the sole basis for treatment or other  patient management decisions.  A negative result may occur with  improper specimen collection / handling, submission of specimen other  than nasopharyngeal swab, presence of viral mutation(s) within the  areas targeted by this assay, and inadequate number of viral copies  (<250 copies / mL). A negative result must be combined with clinical  observations, patient history, and epidemiological information. If result is POSITIVE SARS-CoV-2 target nucleic acids are DETECTED. The SARS-CoV-2 RNA is generally detectable in upper and lower  respiratory specimens dur ing the acute phase of infection.  Positive  results are indicative of active infection with SARS-CoV-2.  Clinical  correlation with patient history and other diagnostic information is  necessary to determine patient infection status.  Positive results do  not rule out bacterial infection or co-infection with other viruses. If result  is PRESUMPTIVE POSTIVE SARS-CoV-2 nucleic acids MAY BE PRESENT.   A presumptive positive result was obtained on the submitted specimen  and confirmed on repeat testing.  While 2019 novel coronavirus  (SARS-CoV-2) nucleic acids may be present in the submitted sample  additional confirmatory testing may be necessary for epidemiological  and / or clinical management purposes  to differentiate between  SARS-CoV-2 and other Sarbecovirus currently known to infect humans.  If clinically indicated additional testing with an alternate test  methodology [phone]) is advised. The SARS-CoV-2 RNA is generally  detectable in upper and lower respiratory sp ecimens during the acute  phase of infection. The expected result is Negative. Fact Sheet for Patients:  StrictlyIdeas.no Fact Sheet for Healthcare Providers: BankingDealers.co.za This test is not yet approved or cleared by the [place] FDA and has been authorized for detection and/or diagnosis of SARS-CoV-2 by FDA under an Emergency Use Authorization (EUA).  This EUA will remain in effect (meaning this test can be used) for the duration of the COVID-19 declaration under Section 564(b)(1) of the Act, 21 U.S.C. section 360bbb-3(b)(1), unless the authorization is terminated or revoked sooner. Performed at [facility], [address] [place], [place] [address]      Radiology Studies: Ct Angio Chest Pe W And/or Wo Contrast  Result Date: 04/19/2019 CLINICAL DATA:  Right middle lobe nodule. 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 EXAM: CT ANGIOGRAPHY CHEST WITH CONTRAST TECHNIQUE: Multidetector CT imaging of the chest was performed using the standard protocol during bolus administration of intravenous contrast. Multiplanar CT image reconstructions and MIPs were obtained to evaluate the vascular anatomy. CONTRAST:  31mL OMNIPAQUE IOHEXOL 350 MG/ML SOLN  COMPARISON:  CT chest 04/06/2018 FINDINGS: Cardiovascular: Extensive atherosclerotic disease in the thoracic aorta. Ascending aortic aneurysm 4.4 x 4.3 cm unchanged. Extensive coronary calcification. Heart size within normal limits. Negative for pulmonary embolism Mediastinum/Nodes: Negative for mass or adenopathy. Lungs/Pleura: Right middle lobe mass 20 x 30 mm with irregular margins and small calcifications. Previously, this measured 2.9 x 2.1 x 1.8 cm and is approximately the same in size. Previous biopsy demonstrated no malignancy and amyloid deposition. Pleuroparenchymal scarring left upper lobe medially and lingula unchanged from the prior study. No acute infiltrate or pleural effusion. Musculoskeletal: Multilevel degenerative change in the thoracic spine. No fracture or bone lesion. Review of the MIP images confirms the above findings. IMPRESSION: 1. Right middle lobe nodule with calcification stable from prior study. 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 2. Atherosclerotic aorta. Ascending aortic aneurysm 4.4 x 4.3 cm, stable Recommend annual imaging followup by CTA or MRA. This recommendation follows 2010 ACCF/AHA/AATS/ACR/ASA/SCA/SCAI/SIR/STS/SVM Guidelines for the Diagnosis and Management of Patients with Thoracic Aortic Disease. Circulation. 2010; 121: W295-A213. Aortic aneurysm NOS (ICD10-I71.9) 3. Scarring left upper lobe and lingula stable. No acute abnormality in the lungs.  Electronically Signed   By: Franchot Gallo M.D.   On: 04/19/2019 21:46   Ct Abdomen Pelvis W Contrast  Result Date: 04/19/2019 CLINICAL DATA:  GI bleed EXAM: CT ABDOMEN AND PELVIS WITH CONTRAST TECHNIQUE: Multidetector CT imaging of the abdomen and pelvis was performed using the standard protocol following bolus administration of intravenous contrast. CONTRAST:  44mL OMNIPAQUE IOHEXOL 350 MG/ML SOLN COMPARISON:  None. FINDINGS: Hepatobiliary: No focal liver abnormality is seen. No gallstones,  gallbladder wall thickening, or biliary dilatation. Pancreas: Negative Spleen: Negative Adrenals/Urinary Tract: Adrenal glands are unremarkable. Kidneys are normal, without renal calculi, focal lesion, or hydronephrosis. Bladder is unremarkable. Stomach/Bowel: Negative for bowel obstruction. Sigmoid diverticulosis without diverticulitis. Diverticula throughout the left colon. No mass or edema in the bowel. Appendix nonvisualized. Vascular/Lymphatic: Extensive atherosclerotic disease in the abdominal aorta. Abdominal aortic aneurysm measuring 3.7 x 3.5 cm in diameter. No evidence of aortic rupture. Atherosclerotic disease in the iliac arteries bilaterally. No worrisome lymphadenopathy. Reproductive: Hysterectomy.  No pelvic mass. Other: Negative for free fluid or free air. Musculoskeletal: Lumbar and thoracic disc degeneration. No acute skeletal abnormality. IMPRESSION: Sigmoid diverticulosis without diverticulitis. Abdominal aortic aneurysm 3.7 x 3.5 cm. Recommend followup by ultrasound in 2 years. This recommendation follows ACR consensus guidelines: White Paper of the ACR Incidental Findings Committee II on Vascular Findings. J Am Coll Radiol 2013; 10:789-794. Aortic aneurysm NOS (ICD10-I71.9) Electronically Signed   By: Franchot Gallo M.D.   On: 04/19/2019 21:33   Dg Chest Port 1 View  Result Date: 04/19/2019 CLINICAL DATA:  Shortness of breath.  Cough. EXAM: PORTABLE CHEST 1 VIEW COMPARISON:  None. FINDINGS: There is a 2 x 3 cm poorly defined density in the right midzone. The lungs are otherwise clear. Heart size and vascularity are normal. Aortic atherosclerosis. Slight apical pleural thickening on the left. No acute bone abnormalities. Arthritic changes at the left shoulder. Old healed left lateral rib fracture. IMPRESSION: 1. Ill-defined density in the right midzone. This could represent a focal infiltrate or a mass. 2.  Aortic Atherosclerosis (ICD10-I70.0). Electronically Signed   By: Lorriane Shire M.D.    On: 04/19/2019 19:23   Scheduled Meds: . sodium chloride   Intravenous Once  . pantoprazole (PROTONIX) IV  40 mg Intravenous Q12H  . sodium chloride flush  3 mL Intravenous Q12H  . sodium chloride flush  3 mL Intravenous Q12H  . sucralfate  1 g Oral TID WC & HS   Continuous Infusions: . sodium chloride       LOS: 1 day    Time spent: 30 minutes.   Barton Dubois, MD [facility] Pager [phone]  04/21/2019, 3:50 PM

## 2019-04-22 DIAGNOSIS — D62 Acute posthemorrhagic anemia: Secondary | ICD-10-CM

## 2019-04-22 DIAGNOSIS — K209 Esophagitis, unspecified without bleeding: Secondary | ICD-10-CM

## 2019-04-22 LAB — BASIC METABOLIC PANEL
Anion gap: 7 (ref 5–15)
BUN: 20 mg/dL (ref 8–23)
CO2: 27 mmol/L (ref 22–32)
Calcium: 8.8 mg/dL — ABNORMAL LOW (ref 8.9–10.3)
Chloride: 107 mmol/L (ref 98–111)
Creatinine, Ser: 1.45 mg/dL — ABNORMAL HIGH (ref 0.44–1.00)
GFR calc Af Amer: 38 mL/min — ABNORMAL LOW (ref >60)
GFR calc non Af Amer: 33 mL/min — ABNORMAL LOW (ref >60)
Glucose, Bld: 95 mg/dL (ref 70–99)
Potassium: 3.9 mmol/L (ref 3.5–5.1)
Sodium: 141 mmol/L (ref 135–145)

## 2019-04-22 LAB — CBC
HCT: 34.9 % — ABNORMAL LOW (ref 36.0–46.0)
Hemoglobin: 10.7 g/dL — ABNORMAL LOW (ref 12.0–15.0)
MCH: 25.9 pg — ABNORMAL LOW (ref 26.0–34.0)
MCHC: 30.7 g/dL (ref 30.0–36.0)
MCV: 84.5 fL (ref 80.0–100.0)
Platelets: 340 10*3/uL (ref 150–400)
RBC: 4.13 MIL/uL (ref 3.87–5.11)
RDW: 15.7 % — ABNORMAL HIGH (ref 11.5–15.5)
WBC: 6.4 10*3/uL (ref 4.0–10.5)
nRBC: 0 % (ref 0.0–0.2)

## 2019-04-22 LAB — GLUCOSE, CAPILLARY: Glucose-Capillary: 88 mg/dL (ref 70–99)

## 2019-04-22 LAB — H. PYLORI ANTIBODY, IGG: H Pylori IgG: 0.18 Index Value (ref 0.00–0.79)

## 2019-04-22 MED ORDER — ASPIRIN EC 81 MG PO TBEC: 81.0000 mg | DELAYED_RELEASE_TABLET | Freq: Every day | ORAL | Status: DC

## 2019-04-22 MED ORDER — PANTOPRAZOLE SODIUM 40 MG PO TBEC: 40.0000 mg | DELAYED_RELEASE_TABLET | Freq: Two times a day (BID) | ORAL | 1 refills | Status: DC

## 2019-04-22 MED ORDER — CLOPIDOGREL BISULFATE 75 MG PO TABS: 75.0000 mg | ORAL_TABLET | Freq: Every day | ORAL | Status: DC

## 2019-04-22 MED ORDER — SUCRALFATE 1 GM/10ML PO SUSP: 1.0000 g | Freq: Three times a day (TID) | ORAL | 0 refills | Status: DC

## 2019-04-22 NOTE — Discharge Summary (Signed)
Physician Discharge Summary  Leslie Carr ERX:[id number] DOB: 10-09-1931 DOA: 04/19/2019  PCP: [facility]  Admit date: 04/19/2019 Discharge date: 04/22/2019  Time spent: 35 minutes  Recommendations for Outpatient Follow-up:  1. Repeat CBC to follow hemoglobin trend 2. Repeat basic metabolic panel to follow electrolytes and renal function  Discharge Diagnoses:  Principal Problem:   Symptomatic anemia Active Problems:   Status post CVA   Chronic combined systolic (congestive) and diastolic (congestive) heart failure (HCC)   CAD (coronary artery disease)   AAA (abdominal aortic aneurysm) (HCC)   CKD (chronic kidney disease), stage III (HCC)   Esophagitis   Acute blood loss anemia   Discharge Condition: Stable and improved.  Patient discharged home with instruction to follow-up with PCP and gastroenterology service as an outpatient.  Diet recommendation: Heart healthy diet.  Filed Weights   04/19/19 1822 04/20/19 0500 04/22/19 0500  Weight: 63.5 kg 63.6 kg 64.9 kg    History of present illness:  85 y.o.femalewith medical history significant forcoronary artery disease, history of stroke, history of breast cancer, and chronic combined systolic and diastolic CHF, now presenting to the emergency department for evaluation of shortness of breath. The patient reports approximately 3weeksof worsening dyspnea with exertion. She walks 1 to 1.5 miles every day and began to notice shortness of breath where she had previously tolerated this well. Exertional dyspnea has progressively worsened. She has had occasional upper abdominal pain over this intervalthat has been mild and not bothering her much.She had an episode of maroon stool approximately a week ago, but thought that her bowel movements have been fairly normal since then. She denies any indigestion, vomiting, or diarrhea. Reports that she has had 3 colonoscopies previously and did not remember hearing  of any abnormalities. She does not believe that she is ever had an upper endoscopy, denies abdominal pain or vomiting, and denies any alcohol use.  Of note, patient expressed use on and off extra aspirin to assist with what appears to be restless leg syndrome.  Hospital Course:  1-symptomatic anemia: In the setting of acute blood loss anemia. -Hemodynamically stable. -Hemoglobin level 10.7 at discharge -2 units of PRBCs required during hospitalization. -Endoscopy demonstrated distal esophageal ulcer with active bleeding/oozing (status post APC), there was also to a small prepyloric gastric ulcer without stigmata of bleeding.  Positive hiatal hernia. -Continue Carafate for another 2 weeks as recommended by GI; patient also discharged on Protonix twice a day. -Follow hemoglobin trend, with repeat CBC at follow-up visit. -Follow as instructed with gastroenterology service as an outpatient. -Continue holding aspirin and Plavix until 04/24/19 -Patient instructed to avoid the use of NSAIDs.  2-coronary artery disease -Patient denies chest pains -Following GI recommendations will be safe to resume dual antiplatelet therapy using aspirin and Plavix on 04/24/2019 -Patient instructed to follow heart healthy diet and will resume the use of his statins.  3-Status post CVA -There has not been complaints of acute focal deficits -Holding aspirin and Plavix in the setting of acute GI bleed -Resume the use of statins -Per GI safe to resume antiplatelet therapy on 04/24/2019  4-Chronic combined systolic (congestive) and diastolic (congestive) heart failure ([place]) -Compensated and is stable -Follow daily weights and low-sodium diet. -Patient denies shortness of breath.  5-AAA (abdominal aortic aneurysm) (HCC) -Follow-up with ultrasound in 2 years as recommended -There is no signs of acute rupture  6-CKD (chronic kidney disease), stage III (HCC) -Serum creatinine 1.35 on admission which is similar  to prior creatinine  level on her records -Advised to maintain adequate oral hydration -Follow renal function trend at follow-up visit with repeat basic metabolic panel..   Procedures:  See below for x-ray reports.  EGD: 04/20/2019; demonstrating distal esophageal ulcer (with active bleeding/oozing), to a small prepyloric gastric ulcer without stigmata of bleeding; 3 cm hiatal hernia  Consultations:  GI service  Discharge Exam: Vitals:   04/22/19 0527 04/22/19 0541  BP: 98/86 (!) 143/74  Pulse: 71 84  Resp:    Temp: 98.6 F (37 C) 98.7 F (37.1 C)  SpO2: 100% 95%   General exam: Alert, awake, oriented x 3; reports an intermittent episode of epigastric pain.  No nausea, no vomiting.  No episodes of bloody stools or hematemesis. Respiratory system: Clear to auscultation. Respiratory effort normal. Cardiovascular system:RRR. No murmurs, rubs, gallops. Gastrointestinal system: Abdomen is nondistended, soft and nontender. No organomegaly or masses felt. Normal bowel sounds heard. Central nervous system: Alert and oriented. No focal neurological deficits. Extremities: No C/C/E, +pedal pulses Skin: No rashes, lesions or ulcers Psychiatry: Judgement and insight appear normal. Mood & affect appropriate.   Discharge Instructions   Discharge Instructions    (HEART FAILURE PATIENTS) Call MD:  Anytime you have any of the following symptoms: 1) 3 pound weight gain in 24 hours or 5 pounds in 1 week 2) shortness of breath, with or without a dry hacking cough 3) swelling in the hands, feet or stomach 4) if you have to sleep on extra pillows at night in order to breathe.   Complete by:  As directed    Diet - low sodium heart healthy   Complete by:  As directed    Discharge instructions   Complete by:  As directed    Take medications as prescribed Resume aspirin and Plavix on 04/24/2019 Please avoid the use of NSAIDs Maintain adequate hydration Follow-up with gastroenterology service as  instructed Follow-up with PCP in 10 days.     Allergies as of 04/22/2019      Reactions   Lipitor [atorvastatin] Other (See Comments)   Dizzy and make her head feel huge       Medication List    TAKE these medications   aspirin EC 81 MG tablet Take 1 tablet (81 mg total) by mouth daily. Start taking on:  Apr 24, 2019 What changed:  These instructions start on Apr 24, 2019. If you are unsure what to do until then, ask your doctor or other care provider.   clopidogrel 75 MG tablet Commonly known as:  PLAVIX Take 1 tablet (75 mg total) by mouth daily with breakfast. Start taking on:  Apr 24, 2019 What changed:  These instructions start on Apr 24, 2019. If you are unsure what to do until then, ask your doctor or other care provider.   docusate sodium 100 MG capsule Commonly known as:  COLACE Take 100 mg by mouth every evening.   EMERGEN-C IMMUNE PLUS PO Take 1 packet by mouth every evening.   pantoprazole 40 MG tablet Commonly known as:  PROTONIX Take 1 tablet (40 mg total) by mouth 2 (two) times daily before a meal.   pravastatin 20 MG tablet Commonly known as:  PRAVACHOL Take 20 mg by mouth every evening.   sucralfate 1 GM/10ML suspension Commonly known as:  CARAFATE Take 10 mLs (1 g total) by mouth 4 (four) times daily -  with meals and at bedtime for 14 days.      Allergies  Allergen Reactions  . Lipitor [  Atorvastatin] Other (See Comments)    Dizzy and make her head feel huge    Follow-up Information    [place] [place]. Schedule an appointment as soon as possible for a visit in 10 day(s).   Specialty:  Family Medicine Contact information: [address] [place] [address] [phone]           The results of significant diagnostics from this hospitalization (including imaging, microbiology, ancillary and laboratory) are listed below for reference.    Significant Diagnostic Studies: Ct Angio Chest Pe W And/or Wo  Contrast  Result Date: 04/19/2019 CLINICAL DATA:  Right middle lobe nodule. 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 EXAM: CT ANGIOGRAPHY CHEST WITH CONTRAST TECHNIQUE: Multidetector CT imaging of the chest was performed using the standard protocol during bolus administration of intravenous contrast. Multiplanar CT image reconstructions and MIPs were obtained to evaluate the vascular anatomy. CONTRAST:  23mL OMNIPAQUE IOHEXOL 350 MG/ML SOLN COMPARISON:  CT chest 04/06/2018 FINDINGS: Cardiovascular: Extensive atherosclerotic disease in the thoracic aorta. Ascending aortic aneurysm 4.4 x 4.3 cm unchanged. Extensive coronary calcification. Heart size within normal limits. Negative for pulmonary embolism Mediastinum/Nodes: Negative for mass or adenopathy. Lungs/Pleura: Right middle lobe mass 20 x 30 mm with irregular margins and small calcifications. Previously, this measured 2.9 x 2.1 x 1.8 cm and is approximately the same in size. Previous biopsy demonstrated no malignancy and amyloid deposition. Pleuroparenchymal scarring left upper lobe medially and lingula unchanged from the prior study. No acute infiltrate or pleural effusion. Musculoskeletal: Multilevel degenerative change in the thoracic spine. No fracture or bone lesion. Review of the MIP images confirms the above findings. IMPRESSION: 1. Right middle lobe nodule with calcification stable from prior study. 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 2. Atherosclerotic aorta. Ascending aortic aneurysm 4.4 x 4.3 cm, stable Recommend annual imaging followup by CTA or MRA. This recommendation follows 2010 ACCF/AHA/AATS/ACR/ASA/SCA/SCAI/SIR/STS/SVM Guidelines for the Diagnosis and Management of Patients with Thoracic Aortic Disease. Circulation. 2010; 121: Z169-C789. Aortic aneurysm NOS (ICD10-I71.9) 3. Scarring left upper lobe and lingula stable. No acute abnormality in the lungs.  Electronically Signed   By: Franchot Gallo M.D.   On: 04/19/2019 21:46   Ct Abdomen Pelvis W Contrast  Result Date: 04/19/2019 CLINICAL DATA:  GI bleed EXAM: CT ABDOMEN AND PELVIS WITH CONTRAST TECHNIQUE: Multidetector CT imaging of the abdomen and pelvis was performed using the standard protocol following bolus administration of intravenous contrast. CONTRAST:  90mL OMNIPAQUE IOHEXOL 350 MG/ML SOLN COMPARISON:  None. FINDINGS: Hepatobiliary: No focal liver abnormality is seen. No gallstones, gallbladder wall thickening, or biliary dilatation. Pancreas: Negative Spleen: Negative Adrenals/Urinary Tract: Adrenal glands are unremarkable. Kidneys are normal, without renal calculi, focal lesion, or hydronephrosis. Bladder is unremarkable. Stomach/Bowel: Negative for bowel obstruction. Sigmoid diverticulosis without diverticulitis. Diverticula throughout the left colon. No mass or edema in the bowel. Appendix nonvisualized. Vascular/Lymphatic: Extensive atherosclerotic disease in the abdominal aorta. Abdominal aortic aneurysm measuring 3.7 x 3.5 cm in diameter. No evidence of aortic rupture. Atherosclerotic disease in the iliac arteries bilaterally. No worrisome lymphadenopathy. Reproductive: Hysterectomy.  No pelvic mass. Other: Negative for free fluid or free air. Musculoskeletal: Lumbar and thoracic disc degeneration. No acute skeletal abnormality. IMPRESSION: Sigmoid diverticulosis without diverticulitis. Abdominal aortic aneurysm 3.7 x 3.5 cm. Recommend followup by ultrasound in 2 years. This recommendation follows ACR consensus guidelines: White Paper of the ACR Incidental Findings Committee II on Vascular Findings. J Am Coll  Radiol 2013; 62:952-841. Aortic aneurysm NOS (ICD10-I71.9) Electronically Signed   By: Franchot Gallo M.D.   On: 04/19/2019 21:33   Dg Chest Port 1 View  Result Date: 04/19/2019 CLINICAL DATA:  Shortness of breath.  Cough. EXAM: PORTABLE CHEST 1 VIEW COMPARISON:  None. FINDINGS: There  is a 2 x 3 cm poorly defined density in the right midzone. The lungs are otherwise clear. Heart size and vascularity are normal. Aortic atherosclerosis. Slight apical pleural thickening on the left. No acute bone abnormalities. Arthritic changes at the left shoulder. Old healed left lateral rib fracture. IMPRESSION: 1. Ill-defined density in the right midzone. This could represent a focal infiltrate or a mass. 2.  Aortic Atherosclerosis (ICD10-I70.0). Electronically Signed   By: Lorriane Shire M.D.   On: 04/19/2019 19:23    Microbiology: Recent Results (from the past 240 hour(s))  SARS Coronavirus 2 [facility] order, Performed in [place] hospital lab)     Status: None   Collection Time: 04/19/19  6:49 PM  Result Value Ref Range Status   SARS Coronavirus 2 NEGATIVE NEGATIVE Final    Comment: (NOTE) If result is NEGATIVE SARS-CoV-2 target nucleic acids are NOT DETECTED. The SARS-CoV-2 RNA is generally detectable in upper and lower  respiratory specimens during the acute phase of infection. The lowest  concentration of SARS-CoV-2 viral copies this assay can detect is 250  copies / mL. A negative result does not preclude SARS-CoV-2 infection  and should not be used as the sole basis for treatment or other  patient management decisions.  A negative result may occur with  improper specimen collection / handling, submission of specimen other  than nasopharyngeal swab, presence of viral mutation(s) within the  areas targeted by this assay, and inadequate number of viral copies  (<250 copies / mL). A negative result must be combined with clinical  observations, patient history, and epidemiological information. If result is POSITIVE SARS-CoV-2 target nucleic acids are DETECTED. The SARS-CoV-2 RNA is generally detectable in upper and lower  respiratory specimens dur ing the acute phase of infection.  Positive  results are indicative of active infection with SARS-CoV-2.  Clinical  correlation  with patient history and other diagnostic information is  necessary to determine patient infection status.  Positive results do  not rule out bacterial infection or co-infection with other viruses. If result is PRESUMPTIVE POSTIVE SARS-CoV-2 nucleic acids MAY BE PRESENT.   A presumptive positive result was obtained on the submitted specimen  and confirmed on repeat testing.  While 2019 novel coronavirus  (SARS-CoV-2) nucleic acids may be present in the submitted sample  additional confirmatory testing may be necessary for epidemiological  and / or clinical management purposes  to differentiate between  SARS-CoV-2 and other Sarbecovirus currently known to infect humans.  If clinically indicated additional testing with an alternate test  methodology [phone]) is advised. The SARS-CoV-2 RNA is generally  detectable in upper and lower respiratory sp ecimens during the acute  phase of infection. The expected result is Negative. Fact Sheet for Patients:  StrictlyIdeas.no Fact Sheet for Healthcare Providers: BankingDealers.co.za This test is not yet approved or cleared by the [place] FDA and has been authorized for detection and/or diagnosis of SARS-CoV-2 by FDA under an Emergency Use Authorization (EUA).  This EUA will remain in effect (meaning this test can be used) for the duration of the COVID-19 declaration under Section 564(b)(1) of the Act, 21 U.S.C. section 360bbb-3(b)(1), unless the authorization is terminated or revoked sooner. Performed at  [address] [place], [place] [address]      Labs: Basic Metabolic Panel: Recent Labs  Lab 04/19/19 1905 04/20/19 0624 04/22/19 0616  NA 139 140 141  K 3.9 4.1 3.9  CL 107 107 107  CO2 24 24 27   GLUCOSE 106* 94 95  BUN 35* 28* 20  CREATININE 1.35* 1.30* 1.45*  CALCIUM 8.4* 8.3* 8.8*   Liver Function Tests: Recent Labs  Lab 04/19/19 1905  AST 16  ALT 11   ALKPHOS 53  BILITOT 0.3  PROT 6.7  ALBUMIN 3.6   CBC: Recent Labs  Lab 04/19/19 1905 04/20/19 0624 04/21/19 0505 04/22/19 0616  WBC 5.3 5.8 5.6 6.4  NEUTROABS 4.0  --   --   --   HGB 7.2* 8.4* 10.3* 10.7*  HCT 25.0* 27.9* 34.1* 34.9*  MCV 82.5 82.1 84.0 84.5  PLT 358 320 336 340   Cardiac Enzymes: Recent Labs  Lab 04/19/19 1905  TROPONINI <0.03   BNP: BNP (last 3 results) Recent Labs    04/19/19 1905  BNP 211.0*   CBG: Recent Labs  Lab 04/20/19 0720 04/21/19 0758 04/22/19 0733  GLUCAP 88 93 88   Signed:  Barton Dubois MD.  [facility] 04/22/2019, 2:27 PM

## 2019-04-22 NOTE — Care Management Important Message (Signed)
Important Message  Patient Details  Name: Leslie Carr MRN: [id number] Date of Birth: 05-13-1931   Medicare Important Message Given:  Yes    Tommy Medal 04/22/2019, 12:07 PM

## 2019-04-22 NOTE — Plan of Care (Signed)
  Problem: Education: Goal: Knowledge of General Education information will improve Description Including pain rating scale, medication(s)/side effects and non-pharmacologic comfort measures Outcome: Adequate for Discharge   Problem: Health Behavior/Discharge Planning: Goal: Ability to manage health-related needs will improve Outcome: Adequate for Discharge   Problem: Clinical Measurements: Goal: Ability to maintain clinical measurements within normal limits will improve Outcome: Adequate for Discharge   Problem: Activity: Goal: Risk for activity intolerance will decrease Outcome: Adequate for Discharge   Problem: Nutrition: Goal: Adequate nutrition will be maintained Outcome: Adequate for Discharge   Problem: Coping: Goal: Level of anxiety will decrease Outcome: Adequate for Discharge   Problem: Elimination: Goal: Will not experience complications related to bowel motility Outcome: Adequate for Discharge   Problem: Pain Managment: Goal: General experience of comfort will improve Outcome: Adequate for Discharge   Problem: Safety: Goal: Ability to remain free from injury will improve Outcome: Adequate for Discharge   Problem: Skin Integrity: Goal: Risk for impaired skin integrity will decrease Outcome: Adequate for Discharge   

## 2019-04-22 NOTE — Progress Notes (Signed)
  Subjective:  Patient has no complaints.  She denies heartburn or dysphagia.  She also denies abdominal pain.  Her appetite is good.  She had a bowel movement yesterday and was not black.  It was dark.  Objective: Blood pressure (!) 143/74, pulse 84, temperature 98.7 F (37.1 C), temperature source Oral, resp. rate 18, height _0  (1.6 m), weight 64.9 kg, SpO2 95 %. Patient is alert and in no acute distress. Diminished soft and nontender.  Labs/studies Results:  CBC Latest Ref Rng & Units 04/22/2019 04/21/2019 04/20/2019  WBC 4.0 - 10.5 K/uL 6.4 5.6 5.8  Hemoglobin 12.0 - 15.0 g/dL 10.7(L) 10.3(L) 8.4(L)  Hematocrit 36.0 - 46.0 % 34.9(L) 34.1(L) 27.9(L)  Platelets 150 - 400 K/uL 340 336 320    CMP Latest Ref Rng & Units 04/22/2019 04/20/2019 04/19/2019  Glucose 70 - 99 mg/dL 95 94 106(H)  BUN 8 - 23 mg/dL 20 28(H) 35(H)  Creatinine 0.44 - 1.00 mg/dL 1.45(H) 1.30(H) 1.35(H)  Sodium 135 - 145 mmol/L 141 140 139  Potassium 3.5 - 5.1 mmol/L 3.9 4.1 3.9  Chloride 98 - 111 mmol/L 107 107 107  CO2 22 - 32 mmol/L _1 Calcium 8.9 - 10.3 mg/dL 8.8(L) 8.3(L) 8.4(L)  Total Protein 6.5 - 8.1 g/dL - - 6.7  Total Bilirubin 0.3 - 1.2 mg/dL - - 0.3  Alkaline Phos 38 - 126 U/L - - 53  AST 15 - 41 U/L - - 16  ALT 0 - 44 U/L - - 11    Hepatic Function Latest Ref Rng & Units 04/19/2019 04/19/2016 04/15/2016  Total Protein 6.5 - 8.1 g/dL 6.7 4.2(L) 6.6  Albumin 3.5 - 5.0 g/dL 3.6 2.0(L) 3.4(L)  AST 15 - 41 U/L _2 ALT 0 - 44 U/L 11 14 12(L)  Alk Phosphatase 38 - 126 U/L 53 42 71  Total Bilirubin 0.3 - 1.2 mg/dL 0.3 0.5 0.3     H. pylori serology is negative.  Assessment:  #1.  Upper GI bleed secondary to distal esophageal ulcer controlled with APC therapy 2 days ago.  Patient's hemoglobin is coming up.  No evidence of active or recurrent GI bleed.  Etiology felt to be pill esophagitis versus GERD.  #2.  Peptic ulcer disease.  She was found to have 2 small gastric ulcers with clean base.   H. pylori serology is negative.  Etiology felt to be aspirin and antiplatelet agent.  #3.  Anemia secondary to upper GI bleed.  Patient has received 2 units of PRBCs.  #4.  Chronic kidney disease.  Serum creatinine higher than baseline.  Doubt that it is due to PPI which she needs at this time.  #5.  Antiplatelet status.  Both aspirin and clopidogrel are on hold.  Both these medications could be started tomorrow morning.   Recommendations:  Patient stable for discharge from GI standpoint. Continue pantoprazole at 40 mg p.o. twice daily. Continue sucralfate 1 g p.o. 4 times daily for 2 more weeks. Plan telemetry visit and 2 weeks and will check CBC and metabolic 7 at that time.

## 2019-04-25 ENCOUNTER — Other Ambulatory Visit (INDEPENDENT_AMBULATORY_CARE_PROVIDER_SITE_OTHER): Payer: Self-pay | Admitting: *Deleted

## 2019-04-25 DIAGNOSIS — K922 Gastrointestinal hemorrhage, unspecified: Secondary | ICD-10-CM

## 2019-04-26 LAB — BASIC METABOLIC PANEL
BUN/Creatinine Ratio: 17 (calc) (ref 6–22)
BUN: 26 mg/dL — ABNORMAL HIGH (ref 7–25)
CO2: 28 mmol/L (ref 20–32)
Calcium: 9.1 mg/dL (ref 8.6–10.4)
Chloride: 105 mmol/L (ref 98–110)
Creat: 1.52 mg/dL — ABNORMAL HIGH (ref 0.60–0.88)
Glucose, Bld: 104 mg/dL (ref 65–139)
Potassium: 4.3 mmol/L (ref 3.5–5.3)
Sodium: 141 mmol/L (ref 135–146)

## 2019-04-26 LAB — CBC
HCT: 33.4 % — ABNORMAL LOW (ref 35.0–45.0)
Hemoglobin: 10.6 g/dL — ABNORMAL LOW (ref 11.7–15.5)
MCH: 26.6 pg — ABNORMAL LOW (ref 27.0–33.0)
MCHC: 31.7 g/dL — ABNORMAL LOW (ref 32.0–36.0)
MCV: 83.9 fL (ref 80.0–100.0)
MPV: 11.3 fL (ref 7.5–12.5)
Platelets: 304 10*3/uL (ref 140–400)
RBC: 3.98 10*6/uL (ref 3.80–5.10)
RDW: 16 % — ABNORMAL HIGH (ref 11.0–15.0)
WBC: 6.1 10*3/uL (ref 3.8–10.8)

## 2019-04-27 ENCOUNTER — Other Ambulatory Visit (INDEPENDENT_AMBULATORY_CARE_PROVIDER_SITE_OTHER): Payer: Self-pay | Admitting: *Deleted

## 2019-04-27 ENCOUNTER — Ambulatory Visit (INDEPENDENT_AMBULATORY_CARE_PROVIDER_SITE_OTHER): Payer: Medicare Other | Admitting: Internal Medicine

## 2019-04-27 ENCOUNTER — Other Ambulatory Visit: Payer: Self-pay

## 2019-04-27 ENCOUNTER — Encounter (INDEPENDENT_AMBULATORY_CARE_PROVIDER_SITE_OTHER): Payer: Self-pay | Admitting: Internal Medicine

## 2019-04-27 DIAGNOSIS — Z9861 Coronary angioplasty status: Secondary | ICD-10-CM

## 2019-04-27 DIAGNOSIS — I2510 Atherosclerotic heart disease of native coronary artery without angina pectoris: Secondary | ICD-10-CM

## 2019-04-27 DIAGNOSIS — D62 Acute posthemorrhagic anemia: Secondary | ICD-10-CM

## 2019-04-27 DIAGNOSIS — Z8719 Personal history of other diseases of the digestive system: Secondary | ICD-10-CM | POA: Diagnosis not present

## 2019-04-27 DIAGNOSIS — N189 Chronic kidney disease, unspecified: Secondary | ICD-10-CM

## 2019-04-27 DIAGNOSIS — K279 Peptic ulcer, site unspecified, unspecified as acute or chronic, without hemorrhage or perforation: Secondary | ICD-10-CM | POA: Diagnosis not present

## 2019-04-27 DIAGNOSIS — K922 Gastrointestinal hemorrhage, unspecified: Secondary | ICD-10-CM

## 2019-04-27 DIAGNOSIS — R7989 Other specified abnormal findings of blood chemistry: Secondary | ICD-10-CM

## 2019-04-27 MED ORDER — ASPIRIN EC 81 MG PO TBEC: 81.0000 mg | DELAYED_RELEASE_TABLET | Freq: Every day | ORAL | Status: DC

## 2019-04-27 NOTE — Progress Notes (Signed)
Virtual Visit via Telephone Note  Telemetry visit was reviewed yesterday follow-up for recent hospitalization for upper GI bleed.  Patient and her daughter complete. I connected with [name] on 04/27/19 at  2:00 PM EDT by telephone and verified that I am speaking with the correct person using two identifiers.  Patient's daughter Santiago Glad was also tired at this time the visit.  Location: Patient: home Provider: hospital   I discussed the limitations, risks, security and privacy concerns of performing an evaluation and management service by telephone and the availability of in person appointments. I also discussed with the patient that there may be a patient responsible charge related to this service. The patient expressed understanding and agreed to proceed.   History of Present Illness:  Patient is 85 year old Caucasian female with acute GI bleed and anemia.  Patient had been on clopidogrel and low-dose aspirin and she also has been taking full dose aspirin on as needed basis for headache or musculoskeletal pain.  EGD revealed active bleeding/oozing from distal esophageal ulcer and was controlled with APC.  She was also found to have 2 small gastric ulcers with clean bases.  Patient received 2 units of PRBCs.  Her hemoglobin at the time of discharge was 10.7.  H. pylori serology was negative.  Patient was discharged on 04/22/2019 and she was advised to go back on clopidogrel and low-dose aspirin on 04/23/2019.  Patient's creatinine was noted to be higher than her baseline and plans were made for her to have a blood test prior to this visit. She has no complaints.  She feels well.  Her daughter states she walked almost a mile today and did not get tired or lightheaded.  Her stools are now formed and brown and not maroon or burgundy.  She denies abdominal pain nausea vomiting heartburn or dysphagia. Her daughter Santiago Glad states that her mother does not drink much water.  Medications reviewed with the  patient and her daughter.  She is not taking low-dose aspirin which appears to be misunderstanding.   Observations/Objective:  Lab data from 04/26/2019 WBC 6.1, H&H 10.6 and 33.4 and platelet count 304K. Glucose 104, BUN 26, creatinine 1.52 Serum sodium 141, potassium 4.3, chloride 105, CO2 28 and serum calcium 9.1.   Assessment and Plan:  #1.  Upper GI bleed secondary to distal esophageal ulcer either due to GERD or pill esophagitis.  Status post APC therapy for hemostasis last week.  She was also found to have 2 small gastric ulcers most likely secondary to aspirin use.  H. pylori serology was negative.  Need for repeat EGD will be determined at the time of office visit in 8 weeks. Patient appears to be doing well and no evidence of recurrent bleeding with reintroduction of antiplatelet agent.  #2.  Anemia.  H&H is low but stable.  Will be repeated next week.  #3.  Chronic kidney disease.  Review of lab studies from last year reveals elevated serum creatinine ranging from 1.11to 1.32.  Her creatinine now is higher than her baseline.  Renal function will have to be monitored to make sure it is not due to PPI. BUN is also mildly elevated.  Follow Up Instructions:  Patient advised to go back on low-dose aspirin as before. She should not take full dose aspirin as she was doing before. Patient encouraged to increase intake of fluids. She will have H&H and metabolic 7 next week. Office visit in 8 weeks.   I discussed the assessment and treatment plan  with the patient. The patient was provided an opportunity to ask questions and all were answered. The patient agreed with the plan and demonstrated an understanding of the instructions.   The patient was advised to call back or seek an in-person evaluation if the symptoms worsen or if the condition fails to improve as anticipated.  I provided 12 minutes of non-face-to-face time during this encounter.   Hildred Laser, MD

## 2019-04-27 NOTE — Patient Instructions (Signed)
Patient will call office if she has melena or rectal bleeding

## 2019-06-27 ENCOUNTER — Ambulatory Visit (INDEPENDENT_AMBULATORY_CARE_PROVIDER_SITE_OTHER): Payer: Medicare Other | Admitting: Internal Medicine

## 2019-07-26 DIAGNOSIS — Z0000 Encounter for general adult medical examination without abnormal findings: Secondary | ICD-10-CM | POA: Diagnosis not present

## 2019-08-17 DIAGNOSIS — H2512 Age-related nuclear cataract, left eye: Secondary | ICD-10-CM | POA: Diagnosis not present

## 2019-08-17 DIAGNOSIS — Z961 Presence of intraocular lens: Secondary | ICD-10-CM | POA: Diagnosis not present

## 2019-10-07 ENCOUNTER — Other Ambulatory Visit: Payer: Self-pay | Admitting: *Deleted

## 2019-10-07 DIAGNOSIS — I712 Thoracic aortic aneurysm, without rupture, unspecified: Secondary | ICD-10-CM

## 2019-10-25 ENCOUNTER — Other Ambulatory Visit: Payer: Self-pay | Admitting: Cardiology

## 2019-11-08 ENCOUNTER — Other Ambulatory Visit: Payer: Self-pay | Admitting: Cardiology

## 2019-11-09 ENCOUNTER — Other Ambulatory Visit: Payer: Medicare Other

## 2019-11-10 ENCOUNTER — Other Ambulatory Visit: Payer: Self-pay | Admitting: Cardiology

## 2019-11-10 NOTE — Telephone Encounter (Signed)
Dr. Ellyn Hack patient

## 2019-11-15 ENCOUNTER — Ambulatory Visit: Payer: Medicare Other | Admitting: Thoracic Surgery (Cardiothoracic Vascular Surgery)

## 2019-11-22 ENCOUNTER — Ambulatory Visit
Admission: RE | Admit: 2019-11-22 | Discharge: 2019-11-22 | Disposition: A | Discharge status: Home / Self Care | Payer: Medicare Other | Source: Ambulatory Visit | Attending: Thoracic Surgery (Cardiothoracic Vascular Surgery) | Admitting: Thoracic Surgery (Cardiothoracic Vascular Surgery)

## 2019-11-22 DIAGNOSIS — I712 Thoracic aortic aneurysm, without rupture, unspecified: Secondary | ICD-10-CM

## 2019-11-29 ENCOUNTER — Encounter: Payer: Self-pay | Admitting: Thoracic Surgery (Cardiothoracic Vascular Surgery)

## 2019-11-29 ENCOUNTER — Ambulatory Visit (INDEPENDENT_AMBULATORY_CARE_PROVIDER_SITE_OTHER): Payer: Medicare Other | Admitting: Thoracic Surgery (Cardiothoracic Vascular Surgery)

## 2019-11-29 ENCOUNTER — Other Ambulatory Visit: Payer: Self-pay

## 2019-11-29 VITALS — BP 160/80 | HR 82 | Temp 97.8°F | Resp 20 | Ht 63.0 in | Wt 135.0 lb

## 2019-11-29 DIAGNOSIS — I2510 Atherosclerotic heart disease of native coronary artery without angina pectoris: Secondary | ICD-10-CM | POA: Diagnosis not present

## 2019-11-29 DIAGNOSIS — I712 Thoracic aortic aneurysm, without rupture: Secondary | ICD-10-CM

## 2019-11-29 DIAGNOSIS — Z9861 Coronary angioplasty status: Secondary | ICD-10-CM | POA: Diagnosis not present

## 2019-11-29 DIAGNOSIS — I7121 Aneurysm of the ascending aorta, without rupture: Secondary | ICD-10-CM

## 2019-11-29 NOTE — Progress Notes (Signed)
[address]       [place] [address]             [phone]     HPI: 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  She had decortication for an inflammatory pleural effusion back in April 2017.  She has been followed since that time.  She has a known right middle lobe nodule as well as an ascending aneurysm.  Her most recent CT showed the ascending aneurysm was 4.6 cm.  In the interim since her last visit she was admitted back in May with symptomatic anemia due to gastrointestinal bleeding.  Since that time she has felt fine.  She denies any chest pain, pressure, tightness, or shortness of breath.  Past Medical History:  Diagnosis Date  . Breast cancer ([place])   . CHF (congestive heart failure) ([place])   . Heart murmur   . History of bronchitis   . Hypercholesteremia   . Numbness and tingling in left hand   . Pleural effusion on left   . Shortness of breath dyspnea    "only going up a flight of stairs"  . Stroke [facility] 2014   left sided weakness - mainly left hand    Current Outpatient Medications  Medication Sig Dispense Refill  . acetaminophen (TYLENOL) 500 MG tablet Take 500 mg by mouth as needed. For pain when needed.    Marland Kitchen aspirin EC 81 MG tablet Take 1 tablet (81 mg total) by mouth daily.    . clopidogrel (PLAVIX) 75 MG tablet TAKE 1 TABLET (75 MG TOTAL) BY MOUTH DAILY WITH BREAKFAST. 30 tablet 11  . docusate sodium (COLACE) 100 MG capsule Take 100 mg by mouth every evening.    . Multiple Vitamins-Minerals (EMERGEN-C IMMUNE PLUS PO) Take 1 packet by mouth every evening.     . pravastatin (PRAVACHOL) 20 MG tablet Take 20 mg by mouth every evening.     . pantoprazole  (PROTONIX) 40 MG tablet Take 1 tablet (40 mg total) by mouth 2 (two) times daily before a meal. (Patient not taking: Reported on 11/29/2019) 60 tablet 1   No current facility-administered medications for this visit.     Physical Exam BP (!) 160/80   Pulse 82   Temp 97.8 F (36.6 C) (Skin)   Resp 20   Ht 5\' 3"  (1.6 m)   Wt 135 lb (61.2 kg)   SpO2 95% Comment: RA  BMI 23.16 kg/m  85 year old woman in no acute distress Alert and oriented x3 with no focal deficits Lungs clear with equal breath sounds bilaterally Cardiac regular rate and rhythm with a 2/6 systolic murmur No peripheral edema  Diagnostic Tests: CT CHEST WITHOUT CONTRAST  TECHNIQUE: Multidetector CT imaging of the chest was performed following the standard protocol without IV contrast.  COMPARISON:  04/19/2027 chest CT angiogram.  FINDINGS: Cardiovascular: Normal heart size. No significant pericardial effusion/thickening. Three-vessel coronary atherosclerosis. Atherosclerotic thoracic aorta with 4.7 cm ascending thoracic aortic aneurysm, slightly increased from 4.6 cm using similar measurement technique. Normal caliber pulmonary arteries.  Mediastinum/Nodes: Stable coarsely calcified 1.3 cm left thyroid nodule, requiring no follow-up. Unremarkable esophagus. No pathologically enlarged axillary, mediastinal or hilar lymph nodes, noting limited sensitivity for the detection of  hilar adenopathy on this noncontrast study.  Lungs/Pleura: No pneumothorax. No right pleural effusion. Stable smooth posterior left pleural thickening without significant pleural effusion. Spiculated partially calcified 3.0 x 2.4 cm posterior right middle lobe lung mass (series 8/image 79), previously 3.1 x 2.2 cm using similar measurement technique, not appreciably changed. Stable calcified subcentimeter right lower lobe granuloma. Stable calcified peribronchial central left lower lobe 0.7 cm nodule (series 8/image 61). Thick  parenchymal band with associated volume loss in lingula and medial left upper lobe, compatible with nonspecific postinfectious/postinflammatory scarring. No acute consolidative airspace disease or new significant pulmonary nodules.  Upper abdomen: Small hiatal hernia.  Musculoskeletal: No aggressive appearing focal osseous lesions. No substantial change in patchy subcutaneous fat stranding in the medial ventral left chest wall status post left mastectomy. Moderate thoracic spondylosis.  IMPRESSION: 1. Ascending thoracic aortic 4.7 cm aneurysm, slightly increased. Ascending thoracic aortic aneurysm. Recommend semi-annual imaging followup by CTA or MRA and referral to cardiothoracic surgery if not already obtained. This recommendation follows 2010 ACCF/AHA/AATS/ACR/ASA/SCA/SCAI/SIR/STS/SVM Guidelines for the Diagnosis and Management of Patients With Thoracic Aortic Disease. Circulation. 2010; [id number]. Aortic aneurysm NOS (ICD10-I71.9). 2. Partially calcified spiculated 3.0 cm right middle lobe lung mass, compatible with biopsy-proven amyloidosis. Calcified subcentimeter central left lower lobe pulmonary nodule is stable and considered benign. 3. Stable thick parenchymal band in the medial left upper lobe and lingula with associated volume loss, compatible with nonspecific postinfectious/postinflammatory scarring. No acute pulmonary disease. 4. Three-vessel coronary atherosclerosis. 5. Small hiatal hernia.  Aortic Atherosclerosis (ICD10-I70.0).   Electronically Signed   By: Ilona Sorrel M.D.   On: 11/22/2019 11:00 I personally reviewed the CT images and concur with the findings noted above.  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   Right middle lobe lung nodule-biopsy suggested amyloid.  The nodule has remained relatively stable over time.  Is partially calcified.  Ascending aneurysm-radiology read as slightly increased at 4.7 cm I think it actually is a little smaller than that looking the coronal and sagittal views, but in any event it does need follow-up.  Given that it is measured at 4.7 cm she will need semiannual follow-up.  Aortic atherosclerosis-has severe calcification and a porcelain ascending aorta.  Would be a significant issue if she were to ever require cardiac surgery.  Hypertension-systolic blood pressure elevated today at 160.  She does not check her self every day at home but does check periodically and her daughter helps her with that.  They said that she is consistently below 140.  Likely has an element of whitecoat hypertension.  Plan: Return in 6 months with CT of chest.  No need for IV contrast  Melrose Nakayama, MD [facility] [phone]

## 2020-01-03 ENCOUNTER — Ambulatory Visit: Payer: Medicare Other | Admitting: Cardiology

## 2020-01-04 ENCOUNTER — Ambulatory Visit (INDEPENDENT_AMBULATORY_CARE_PROVIDER_SITE_OTHER): Payer: Medicare Other | Admitting: Cardiology

## 2020-01-04 ENCOUNTER — Encounter: Payer: Self-pay | Admitting: Cardiology

## 2020-01-04 ENCOUNTER — Other Ambulatory Visit: Payer: Self-pay

## 2020-01-04 VITALS — BP 140/76 | HR 70 | Temp 97.2°F | Ht 64.0 in | Wt 134.2 lb

## 2020-01-04 DIAGNOSIS — I214 Non-ST elevation (NSTEMI) myocardial infarction: Secondary | ICD-10-CM

## 2020-01-04 DIAGNOSIS — R0989 Other specified symptoms and signs involving the circulatory and respiratory systems: Secondary | ICD-10-CM

## 2020-01-04 DIAGNOSIS — I480 Paroxysmal atrial fibrillation: Secondary | ICD-10-CM | POA: Diagnosis not present

## 2020-01-04 DIAGNOSIS — I350 Nonrheumatic aortic (valve) stenosis: Secondary | ICD-10-CM | POA: Diagnosis not present

## 2020-01-04 DIAGNOSIS — I5032 Chronic diastolic (congestive) heart failure: Secondary | ICD-10-CM | POA: Diagnosis not present

## 2020-01-04 DIAGNOSIS — I6300 Cerebral infarction due to thrombosis of unspecified precerebral artery: Secondary | ICD-10-CM

## 2020-01-04 DIAGNOSIS — I712 Thoracic aortic aneurysm, without rupture: Secondary | ICD-10-CM

## 2020-01-04 DIAGNOSIS — I2510 Atherosclerotic heart disease of native coronary artery without angina pectoris: Secondary | ICD-10-CM | POA: Diagnosis not present

## 2020-01-04 DIAGNOSIS — E782 Mixed hyperlipidemia: Secondary | ICD-10-CM | POA: Diagnosis not present

## 2020-01-04 DIAGNOSIS — I7121 Aneurysm of the ascending aorta, without rupture: Secondary | ICD-10-CM

## 2020-01-04 DIAGNOSIS — K254 Chronic or unspecified gastric ulcer with hemorrhage: Secondary | ICD-10-CM

## 2020-01-04 NOTE — Patient Instructions (Addendum)
Medication Instructions:  STOP TAKING  ASPIRIN   *If you need a refill on your cardiac medications before your next appointment, please call your pharmacy*  Lab Work:  LIPID CBC CMP SOMETIME  THIS MONTH   If you have labs (blood work) drawn today and your tests are completely normal, you will receive your results only by: Marland Kitchen MyChart Message (if you have MyChart) OR . A paper copy in the mail If you have any lab test that is abnormal or we need to change your treatment, we will call you to review the results.  Testing/Procedures: NOT NEEDED  Follow-Up: At [facility], you and your health needs are our priority.  As part of our continuing mission to provide you with exceptional heart care, we have created designated Provider Care Teams.  These Care Teams include your primary Cardiologist (physician) and Advanced Practice Providers (APPs -  Physician Assistants and Nurse Practitioners) who all work together to provide you with the care you need, when you need it.  Your next appointment:   6 month(s)  The format for your next appointment:   In Person  Provider:   Glenetta Hew, MD  Other instructions: You can look up [facility] web page or [employer] page  - for information on getting the covid vaccine

## 2020-01-04 NOTE — Progress Notes (Signed)
Primary Care Provider: [name] Cardiologist: No primary care provider on file. CVTS:  Dr. Roxan Hockey  Clinic Note: Chief Complaint  Patient presents with  . Follow-up    No major cardiac symptoms  . Coronary Artery Disease    History of PCI  . Hypertension    Has been labile with orthostatic hypotension.    HPI:    Leslie Carr is a 86 y.o. female with a PMH (NSTEMI) CAD-multivessel PCI, chronic combined CHF mostly diastolic), history of PAF (CHA2DS2VAsc of 7 -> age, female vascular CHF)), AAA, TIA, prior CVA below who presents today for delayed follow-up.Marland Kitchen  She had previously a patient of Dr. Daneen Schick.  Was seen in 2017 by Delrae Rend, PA with history of what appeared to be A. fib RVR noted during hospitalization.  She was started on Xarelto and if having a GI bleed.  --> Was noted to have functional decline following her husband's death in 2017/06/22.  Noted dyspnea on exertion walking up stairs.  Was hospitalized September 29 with chest pain and dyspnea.  She was referred for cardiac catheterization because of abnormal EKG and ongoing pain.  Cardiac cath showed multivessel disease and she underwent PCI described below.  Relatively low blood pressures during initial follow-up, declined cardiac rehab because of distance of travel.  Leslie Carr was last seen in Feb 2020 by Kathyrn Drown, NP --> noted AM dizziness since restarting plavix. No Vertigo.   Noted to be orthostatic.  Declines OAC for Afib.  --> Was noted that her amiodarone, beta-blocker and ACE inhibitor were all discontinued.  Recent Hospitalizations:  May 2020 - symptomatic anemia - GI bleed.  2 units transfusion distal esophageal ulcer.  Aspirin Plavix temporarily held, restarted on May 3.  She was recently seen by Dr. Koleen Nimrod (CVTS) for her h/o decortication of inflammatory pleural effusion in 03/2016 & TAA ~4.6 cm. -> felt to be stable, plan 6 month f/u.  Reviewed  CV  studies:    The following studies were reviewed today: (if available, images/films reviewed: From Epic Chart or Care Everywhere) . Cath-PCI 09/21/2018: Ostial OM2 85% (DES PCI, Xience Sierra 2.5 mm x 18 mm-minimal residual stenosis), dLCx 45%.  Lesion #2-3: Ostial RCA 70%, prox-mid RCA 70% and mid RCA 95 and 50%.  Distal RCA 30%, ostial RPDA 30%.  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 ->  Plan for atherectomy-staged PCI of RCA.  Required temporary pacemaker. o Staged orbital arthrectomy of RCA: 3 overlapping DES stents (proximal to distal): Xience Sierra 2.75 mm a 28 mm (2.9 mm), 2.5 mm x 33 mm (2.8 mm), 2.25 mm a 12 mm (tapered from 2.6 to 2.4 mm)  Diagnostic       Intervention    Transthoracic Echo 09/21/2018: EF 60 to 65%.  GR 1 DD.  Mild aortic s stenosis (mean gradient 14 mmHg, peak gradient 24 mmHg).  Mild LA dilation.  Lipomatous hypertrophy of intra-atrial septum.  Interval History:   Leslie Carr is here today for the first time I have ever seen her outside of the Cath Lab.  She seems to be doing okay.  She herself is a relatively poor historian, is a bit frustrated that her daughter is was not allowed to come back with her.  She has a list of questions that were all addressed.  She does have some bruising but has not had any further significant bleeding since April-no melena, hematochezia or hematuria..  She has  poor balance but has not any falls.  She does still have some orthostatic symptoms, but not nearly that the extent that she qu had noted before.  She has late end of day swelling that goes down when she puts her feet up.  Otherwise no PND, orthopnea or significant edema.  She still has some baseline dyspnea and cough from some allergies with congestion but otherwise has no significant resting exertional dyspnea.  She says that she will occasionally have off-and-on little aching sensation in her chest but not associated with any particular activity.   Nothing like prior to her MI.  She does get shortness of breath if she walks up steps, but this is not necessarily a new thing for her.  It predates the MI/PCI.  Pretty much though she is active now she walks most mornings trying to do it at least 40 minutes at a time.  She has a distance that she walks to and back.  She is scared to go further for fear of getting lost or getting too far where she cannot make it back.  As long as she is not walking up a steep incline, she does fine without significant dyspnea or chest discomfort.  She may get short of breath going up an incline but not chest pain.  CV Review of Symptoms (Summary): Cardiovascular ROS: positive for - Exertional dyspnea up an incline or upstairs, but not with flat.  Mild orthostatic symptoms, but notably improved.  Also mild end of day swelling. negative for - chest pain, irregular heartbeat, orthopnea, palpitations, paroxysmal nocturnal dyspnea, rapid heart rate, shortness of breath or Syncope/near syncope, TIA/amaurosis fugax, claudication.  The patient does not have symptoms concerning for COVID-19 infection (fever, chills, cough, or new shortness of breath).  The patient is practicing social distancing & masking.     REVIEWED OF SYSTEMS   A comprehensive ROS was performed. Review of Systems  Constitutional: Negative for malaise/fatigue and weight loss.  HENT: Positive for hearing loss. Negative for nosebleeds.   Respiratory: Positive for shortness of breath. Negative for cough (Off and on).   Cardiovascular: Negative for claudication.  Gastrointestinal: Positive for constipation. Negative for abdominal pain, blood in stool, diarrhea and melena.  Genitourinary: Negative for hematuria.  Musculoskeletal: Positive for back pain and joint pain.  Neurological: Positive for dizziness (Positional--sits on the foot of her bed for a few minutes before she stands up in the morning.), weakness (Generalized) and headaches (Off and on).        Very poor balance  Psychiatric/Behavioral: Positive for depression (If not depressed, dysthymic.) and memory loss. The patient is nervous/anxious. The patient does not have insomnia.   All other systems reviewed and are negative.  I have reviewed and (if needed) personally updated the patient's problem list, medications, allergies, past medical and surgical history, social and family history.   PAST MEDICAL HISTORY   Past Medical History:  Diagnosis Date  . Breast cancer ([place])   . Coronary artery disease due to calcified coronary lesion 09/2018   ACS PCI of OM2 on 09/21/2018 followed by staged orbital atherectomy based PCI of ostial to distal RCA.  (Details in Surgical History)  . Diastolic dysfunction with chronic heart failure ([place])   . History of bronchitis   . Hypercholesteremia   . Mild aortic stenosis by prior echocardiogram 09/2018   Mean gradient 14 million mercury, peak 24 mmHg  . Numbness and tingling in left hand   . Pleural effusion on left   .  Shortness of breath dyspnea    "only going up a flight of stairs"  . Stroke [facility]) 2014   left sided weakness - mainly left hand    PAST SURGICAL HISTORY   Past Surgical History:  Procedure Laterality Date  . ABDOMINAL HYSTERECTOMY  1976  . CATARACT EXTRACTION Right   . COLONOSCOPY    . CORONARY ATHERECTOMY N/A 09/24/2018   Procedure: [place] CORONARY STENT PLACEMENT;  Surgeon: Leonie Man, MD;  Location: [place] CV LAB;; staged PCI: Ostial RCA 70%, prox-mid RCA 70% and mid RCA 95 and 50%. ->  Orbital atherectomy followed by 3 overlapping DES stents (proximal to distal): Xience Sierra 2.75 mm a 28 mm (2.9 mm), 2.5 mm x 33 mm (2.8 mm), 2.25 mm a 12 mm (tapered from 2.6 to 2.4 mm)  . CORONARY STENT INTERVENTION N/A 09/21/2018   Procedure: CORONARY STENT INTERVENTION;  Surgeon: Leonie Man, MD;  Location: [place] CV LAB;  Service: Cardiovascular;; Ostial OM2 85% (DES PCI, Xience Sierra 2.5 mm x 18  mm-minimal residual stenosis)  . DECORTICATION Left 04/17/2016   Procedure: Visceral Pleural DECORTICATION;  Surgeon: Melrose Nakayama, MD;  Location: [place];  Service: Thoracic;  Laterality: Left;  . ESOPHAGOGASTRODUODENOSCOPY N/A 04/20/2019   Procedure: ESOPHAGOGASTRODUODENOSCOPY (EGD);  Surgeon: Rogene Houston, MD;  [facility];  Service: Endoscopy;  Laterality: N/A;  APC- circumferential  . FOOT SURGERY Right    "bone spur removal"  . LEFT HEART CATH AND CORONARY ANGIOGRAPHY N/A 09/21/2018   Procedure: LEFT HEART CATH AND CORONARY ANGIOGRAPHY;  Surgeon: Leonie Man, MD;  Location: [place] CV LAB;  Service: Cardiovascular;; Ostial OM2 85% (DES PCI), dLCx 45%.  Lesion #2-3: Ostial RCA 70%, prox-mid RCA 70% and mid RCA 95 and 50%.  Distal RCA 30%, ostial RPDA 30%.  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 MASTECTOMY Left 1985  . PLEURAL BIOPSY Left 04/17/2016   Procedure:  Left PLEURAL BIOPSY;  Surgeon: Melrose Nakayama, MD;  Location: [place];  Service: Thoracic;  Laterality: Left;  . PLEURAL EFFUSION DRAINAGE Left 04/17/2016   Procedure: DRAINAGE OF Left PLEURAL EFFUSION;  Surgeon: Melrose Nakayama, MD;  Location: [place];  Service: Thoracic;  Laterality: Left;  . TONSILLECTOMY    . TRANSTHORACIC ECHOCARDIOGRAM  09/21/2018    EF 60 to 65%.  GR 1 DD.  Mild aortic s stenosis (mean gradient 14 mmHg, peak gradient 24 mmHg).  Mild LA dilation.  Lipomatous hypertrophy of intra-atrial septum.  Marland Kitchen VIDEO ASSISTED THORACOSCOPY Left 04/17/2016   Procedure:  Left VIDEO ASSISTED THORACOSCOPY with Drainage of Pleural Effusion, Pleural Biopsy, Visceral Pleural Decortication and Placement of On-Q pain pump;  Surgeon: Melrose Nakayama, MD;  Location: [facility];  Service: Thoracic;  Laterality: Left;     MEDICATIONS/ALLERGIES   Current Meds  Medication Sig  . acetaminophen (TYLENOL) 500 MG tablet Take 500 mg by mouth as needed. For pain when needed.  .  clopidogrel (PLAVIX) 75 MG tablet TAKE 1 TABLET (75 MG TOTAL) BY MOUTH DAILY WITH BREAKFAST.  Marland Kitchen docusate sodium (COLACE) 100 MG capsule Take 100 mg by mouth every evening.  . Multiple Vitamins-Minerals (EMERGEN-C IMMUNE PLUS PO) Take 1 packet by mouth every evening.   . pantoprazole (PROTONIX) 40 MG tablet Take 1 tablet (40 mg total) by mouth 2 (two) times daily before a meal.  . pravastatin (PRAVACHOL) 20 MG tablet Take 20 mg by mouth every evening.   . [DISCONTINUED]  aspirin EC 81 MG tablet Take 1 tablet (81 mg total) by mouth daily.    Allergies  Allergen Reactions  . Lipitor [Atorvastatin] Other (See Comments)    Dizzy and make her head feel huge      SOCIAL HISTORY/FAMILY HISTORY   Social History   Tobacco Use  . Smoking status: Former Research scientist (life sciences)  . Smokeless tobacco: Never Used  . Tobacco comment: Quit over 40 years ago  Substance Use Topics  . Alcohol use: No    Alcohol/week: 0.0 standard drinks  . Drug use: No   Social History   Social History Narrative  . Not on file  -Her husband died in 2018-07-12.  Family History family history includes Hypertension in her father and mother.   OBJCTIVE -PE, EKG, labs   Wt Readings from Last 3 Encounters:  01/04/20 134 lb 3.2 oz (60.9 kg)  11/29/19 135 lb (61.2 kg)  04/22/19 143 lb 1.3 oz (64.9 kg)    Physical Exam: BP 140/76   Pulse 70   Temp (!) 97.2 F (36.2 C)   Ht 5\' 4"  (1.626 m)   Wt 134 lb 3.2 oz (60.9 kg)   SpO2 100%   BMI 23.04 kg/m  Physical Exam  Constitutional: She is oriented to person, place, and time. She appears well-developed and well-nourished. No distress.  HENT:  Head: Normocephalic and atraumatic.  Neck: No hepatojugular reflux and no JVD present. Carotid bruit is not present (Radiated murmur).  Cardiovascular: Normal rate, regular rhythm and intact distal pulses.  No extrasystoles are present. PMI is not displaced. Exam reveals no gallop and no friction rub.  Murmur heard.  Medium-pitched  harsh crescendo-decrescendo early systolic murmur is present with a grade of 2/6 radiating to the neck. Pulmonary/Chest: Effort normal and breath sounds normal. No respiratory distress. She has no wheezes. She has no rales.  Abdominal: Soft. Bowel sounds are normal. She exhibits no distension. There is no abdominal tenderness. There is no rebound.  Musculoskeletal:        General: No edema. Normal range of motion.     Cervical back: Normal range of motion and neck supple.  Neurological: She is alert and oriented to person, place, and time.  Skin: Skin is warm and dry. No rash noted.  Psychiatric: She has a normal mood and affect.  Somewhat confused and easily flustered.  Poor historian.  Poor memory  Vitals reviewed.    Adult ECG Report  Rate: 70 ;  Rhythm: normal sinus rhythm and Low voltage.  Otherwise normal axis, intervals and durations.;   Narrative Interpretation: Normal  Recent Labs: Has not had labs since September 2019 Lab Results  Component Value Date   CHOL 143 09/20/2018   HDL 36 (L) 09/20/2018   LDLCALC 90 09/20/2018   TRIG 85 09/20/2018   CHOLHDL 4.0 09/20/2018   Lab Results  Component Value Date   CREATININE 1.52 (H) 04/25/2019   BUN 26 (H) 04/25/2019   NA 141 04/25/2019   K 4.3 04/25/2019   CL 105 04/25/2019   CO2 28 04/25/2019    ASSESSMENT/PLAN    Problem List Items Addressed This Visit    PAF (paroxysmal atrial fibrillation) ([place]); CHA2DS2-VASc Score = 7.  Patient declines DOAC (Chronic)    She does have history of PAF, but has not had any symptoms to speak of.  She has declined DOAC in the past.  For now we will simply use Plavix.        Relevant Orders  EKG 12-Lead   Coronary artery disease involving native heart without angina pectoris - Primary (Chronic)    She does have two-vessel CAD with PCI of the RCA and circumflex.  Her RCA PCI was quite complicated with extensive atherectomy and essentially ostium to distal PCI. No longer having any  angina.  No heart failure symptoms. She is now on aspirin Plavix we can stop aspirin. --Okay to hold Plavix 5-7 days preop for procedures.   She is no longer on beta-blocker or ARB because of hypotension issues and with orthostasis  Monitor blood pressures, but would likely probably opt to avoid restarting beta-blocker or ARB in the absence of active heart failure symptoms.       Relevant Orders   EKG 12-Lead   Comprehensive metabolic panel   CBC   Labile hypertension (Chronic)    She has borderline hypertensive recordings today, but has had significant orthostasis.  She has orthostatic symptoms and therefore would not want to be very aggressive with management of her blood pressure.  She is currently not on any medications to allow for permissive hypertension.  Would probably only treat if he consistently is above this level.      Relevant Orders   Comprehensive metabolic panel   Chronic diastolic (congestive) heart failure (HCC) (Chronic)    Well preserved EF on echo.  Does not seem to have any significant exertional dyspnea.  No PND, orthopnea or edema.      Mild aortic stenosis by prior echocardiogram (Chronic)    Mild AS by echo in October 2019.  Can probably wait until October timeframe of this year to recheck for any progression disease.  Murmur seems stable.      Hyperlipidemia (Chronic)    She is on pravastatin.  Has not had lipids checked in a while.  We will have lipids checked in the next month or so to better reassess.  Target would be less than 70, but with concern for possible memory issues, probably would not want to be overly aggressive.      Relevant Orders   Lipid panel   Ascending aortic aneurysm (HCC) (Chronic)    Stable.  Being followed by Dr. Roxan Hockey.      NSTEMI (non-ST elevated myocardial infarction) ([place]) (Chronic)    She is now over a year out from her MI.  She had staged two-vessel PCI and not had any further angina symptoms.  Minimal heart  failure symptoms if any.  EF stable echo.  No regional wall motion abnormality. . Getting plenty of exercise and doing well.      CVA (cerebral vascular accident) ([place])    History of multiple different strokes.  As such she is on aspirin and Plavix, but I think at this stage with her having bruising and having had a GI bleed late last year. Continue clopidogrel without aspirin. Continue statin.  Allowing for mild permissive hypertension because of labile blood pressures.      Gastrointestinal hemorrhage   Relevant Orders   CBC       COVID-19 Education: The signs and symptoms of COVID-19 were discussed with the patient and how to seek care for testing (follow up with PCP or arrange E-visit).   The importance of social distancing was discussed today.  I spent a total of 24minutes with the patient and chart review. >  50% of the time was spent in direct patient consultation.  --> Patient is a very difficult historian.  Poor memory.  Poor recollection.  Things had to be explained multiple times.  We reviewed her cath findings as well as echocardiogram.  She had multiple questions.  We discussed atrial fibrillation with treatment as well as is her aortic valve. Additional time spent with chart review (studies, outside notes, etc): 10 Total Time: 35 min  Current medicines are reviewed at length with the patient today.  (+/- concerns) none   Patient Instructions / Medication Changes & Studies & Tests Ordered   Patient Instructions  Medication Instructions:  STOP TAKING  ASPIRIN   *If you need a refill on your cardiac medications before your next appointment, please call your pharmacy*  Lab Work:  LIPID CBC CMP SOMETIME  THIS MONTH   If you have labs (blood work) drawn today and your tests are completely normal, you will receive your results only by: Marland Kitchen MyChart Message (if you have MyChart) OR . A paper copy in the mail If you have any lab test that is abnormal or we need to  change your treatment, we will call you to review the results.  Testing/Procedures: NOT NEEDED  Follow-Up: At [facility], you and your health needs are our priority.  As part of our continuing mission to provide you with exceptional heart care, we have created designated Provider Care Teams.  These Care Teams include your primary Cardiologist (physician) and Advanced Practice Providers (APPs -  Physician Assistants and Nurse Practitioners) who all work together to provide you with the care you need, when you need it.  Your next appointment:   6 month(s)  The format for your next appointment:   In Person  Provider:   Glenetta Hew, MD  Other instructions: You can look up [facility] web page or [employer] page  - for information on getting the covid vaccine    Studies Ordered:   Orders Placed This Encounter  Procedures  . Lipid panel  . Comprehensive metabolic panel  . CBC  . EKG 12-Lead     Glenetta Hew, M.D., M.S. Interventional Cardiologist   Pager # [phone] Phone # [phone] [address] [place], [place] [address]   Thank you for choosing Heartcare at [facility]!!

## 2020-01-06 ENCOUNTER — Encounter: Payer: Self-pay | Admitting: Cardiology

## 2020-01-06 NOTE — Assessment & Plan Note (Signed)
Stable.  Being followed by Dr. Roxan Hockey.

## 2020-01-06 NOTE — Assessment & Plan Note (Signed)
Mild AS by echo in October 2019.  Can probably wait until October timeframe of this year to recheck for any progression disease.  Murmur seems stable.

## 2020-01-06 NOTE — Assessment & Plan Note (Signed)
She is on pravastatin.  Has not had lipids checked in a while.  We will have lipids checked in the next month or so to better reassess.  Target would be less than 70, but with concern for possible memory issues, probably would not want to be overly aggressive.

## 2020-01-06 NOTE — Assessment & Plan Note (Signed)
She is now over a year out from her MI.  She had staged two-vessel PCI and not had any further angina symptoms.  Minimal heart failure symptoms if any.  EF stable echo.  No regional wall motion abnormality. . Getting plenty of exercise and doing well.

## 2020-01-06 NOTE — Assessment & Plan Note (Signed)
She does have history of PAF, but has not had any symptoms to speak of.  She has declined DOAC in the past.  For now we will simply use Plavix.

## 2020-01-06 NOTE — Assessment & Plan Note (Signed)
She does have two-vessel CAD with PCI of the RCA and circumflex.  Her RCA PCI was quite complicated with extensive atherectomy and essentially ostium to distal PCI. No longer having any angina.  No heart failure symptoms. She is now on aspirin Plavix we can stop aspirin. --Okay to hold Plavix 5-7 days preop for procedures.   She is no longer on beta-blocker or ARB because of hypotension issues and with orthostasis  Monitor blood pressures, but would likely probably opt to avoid restarting beta-blocker or ARB in the absence of active heart failure symptoms.

## 2020-01-06 NOTE — Assessment & Plan Note (Signed)
She has borderline hypertensive recordings today, but has had significant orthostasis.  She has orthostatic symptoms and therefore would not want to be very aggressive with management of her blood pressure.  She is currently not on any medications to allow for permissive hypertension.  Would probably only treat if he consistently is above this level.

## 2020-01-06 NOTE — Assessment & Plan Note (Signed)
Well preserved EF on echo.  Does not seem to have any significant exertional dyspnea.  No PND, orthopnea or edema.

## 2020-01-06 NOTE — Assessment & Plan Note (Signed)
History of multiple different strokes.  As such she is on aspirin and Plavix, but I think at this stage with her having bruising and having had a GI bleed late last year. Continue clopidogrel without aspirin. Continue statin.  Allowing for mild permissive hypertension because of labile blood pressures.

## 2020-01-09 DIAGNOSIS — I10 Essential (primary) hypertension: Secondary | ICD-10-CM | POA: Diagnosis not present

## 2020-01-09 DIAGNOSIS — I2510 Atherosclerotic heart disease of native coronary artery without angina pectoris: Secondary | ICD-10-CM | POA: Diagnosis not present

## 2020-01-09 DIAGNOSIS — K254 Chronic or unspecified gastric ulcer with hemorrhage: Secondary | ICD-10-CM | POA: Diagnosis not present

## 2020-01-09 DIAGNOSIS — E782 Mixed hyperlipidemia: Secondary | ICD-10-CM | POA: Diagnosis not present

## 2020-01-09 LAB — CBC
Hematocrit: 43.5 % (ref 34.0–46.6)
Hemoglobin: 14.1 g/dL (ref 11.1–15.9)
MCH: 29.2 pg (ref 26.6–33.0)
MCHC: 32.4 g/dL (ref 31.5–35.7)
MCV: 90 fL (ref 79–97)
Platelets: 289 10*3/uL (ref 150–450)
RBC: 4.83 x10E6/uL (ref 3.77–5.28)
RDW: 12.9 % (ref 11.7–15.4)
WBC: 6.2 10*3/uL (ref 3.4–10.8)

## 2020-01-09 LAB — COMPREHENSIVE METABOLIC PANEL
ALT: 8 IU/L (ref 0–32)
AST: 19 IU/L (ref 0–40)
Albumin/Globulin Ratio: 1.5 (ref 1.2–2.2)
Albumin: 4.2 g/dL (ref 3.6–4.6)
Alkaline Phosphatase: 71 IU/L (ref 39–117)
BUN/Creatinine Ratio: 16 (ref 12–28)
BUN: 19 mg/dL (ref 8–27)
Bilirubin Total: 0.4 mg/dL (ref 0.0–1.2)
CO2: 25 mmol/L (ref 20–29)
Calcium: 9.5 mg/dL (ref 8.7–10.3)
Chloride: 103 mmol/L (ref 96–106)
Creatinine, Ser: 1.2 mg/dL — ABNORMAL HIGH (ref 0.57–1.00)
GFR calc Af Amer: 47 mL/min/{1.73_m2} — ABNORMAL LOW (ref >59)
GFR calc non Af Amer: 41 mL/min/{1.73_m2} — ABNORMAL LOW (ref >59)
Globulin, Total: 2.8 g/dL (ref 1.5–4.5)
Glucose: 94 mg/dL (ref 65–99)
Potassium: 4.8 mmol/L (ref 3.5–5.2)
Sodium: 142 mmol/L (ref 134–144)
Total Protein: 7 g/dL (ref 6.0–8.5)

## 2020-01-09 LAB — LIPID PANEL
Chol/HDL Ratio: 2.9 ratio (ref 0.0–4.4)
Cholesterol, Total: 156 mg/dL (ref 100–199)
HDL: 53 mg/dL (ref >39)
LDL Chol Calc (NIH): 85 mg/dL (ref 0–99)
Triglycerides: 97 mg/dL (ref 0–149)
VLDL Cholesterol Cal: 18 mg/dL (ref 5–40)

## 2020-01-23 DIAGNOSIS — G459 Transient cerebral ischemic attack, unspecified: Secondary | ICD-10-CM

## 2020-01-23 HISTORY — DX: Transient cerebral ischemic attack, unspecified: G45.9

## 2020-01-30 ENCOUNTER — Telehealth: Payer: Self-pay | Admitting: *Deleted

## 2020-01-30 NOTE — Telephone Encounter (Signed)
LEFT MESSAGE TO CALL BACK  - NEED TO GIVE RESULTS AND NEW PRESCRIPTION

## 2020-01-30 NOTE — Telephone Encounter (Signed)
-----   Message from Leonie Man, MD sent at 01/29/2020  3:37 PM EST ----- Kidney function is pretty stable if not better.  Otherwise Chemistries all look good.   Lipid panel has an LDL of 85 cholesterol 156.   Would like to see if we could increase her pravastatin dose to 40 mg daily & recheck Lipids /LFTs in 6 months.  Glenetta Hew, MD   Rx Pravastatin 40 mg daily, Disp #90, 3 refills

## 2020-02-03 ENCOUNTER — Other Ambulatory Visit: Payer: Self-pay

## 2020-02-03 ENCOUNTER — Encounter (HOSPITAL_COMMUNITY): Payer: Self-pay

## 2020-02-03 ENCOUNTER — Inpatient Hospital Stay (HOSPITAL_COMMUNITY)
Admission: EM | Admit: 2020-02-03 | Discharge: 2020-02-06 | DRG: 069 | Disposition: A | Discharge status: Home / Self Care | Payer: Medicare Other | Attending: Internal Medicine | Admitting: Internal Medicine

## 2020-02-03 ENCOUNTER — Emergency Department (HOSPITAL_COMMUNITY): Payer: Medicare Other

## 2020-02-03 DIAGNOSIS — Z7902 Long term (current) use of antithrombotics/antiplatelets: Secondary | ICD-10-CM

## 2020-02-03 DIAGNOSIS — Z20822 Contact with and (suspected) exposure to covid-19: Secondary | ICD-10-CM | POA: Diagnosis not present

## 2020-02-03 DIAGNOSIS — I130 Hypertensive heart and chronic kidney disease with heart failure and stage 1 through stage 4 chronic kidney disease, or unspecified chronic kidney disease: Secondary | ICD-10-CM | POA: Diagnosis present

## 2020-02-03 DIAGNOSIS — I480 Paroxysmal atrial fibrillation: Secondary | ICD-10-CM | POA: Diagnosis present

## 2020-02-03 DIAGNOSIS — Z969 Presence of functional implant, unspecified: Secondary | ICD-10-CM

## 2020-02-03 DIAGNOSIS — G9341 Metabolic encephalopathy: Secondary | ICD-10-CM | POA: Diagnosis not present

## 2020-02-03 DIAGNOSIS — R410 Disorientation, unspecified: Secondary | ICD-10-CM | POA: Diagnosis not present

## 2020-02-03 DIAGNOSIS — I5042 Chronic combined systolic (congestive) and diastolic (congestive) heart failure: Secondary | ICD-10-CM | POA: Diagnosis not present

## 2020-02-03 DIAGNOSIS — R29700 NIHSS score 0: Secondary | ICD-10-CM | POA: Diagnosis present

## 2020-02-03 DIAGNOSIS — Z9012 Acquired absence of left breast and nipple: Secondary | ICD-10-CM

## 2020-02-03 DIAGNOSIS — I10 Essential (primary) hypertension: Secondary | ICD-10-CM | POA: Diagnosis not present

## 2020-02-03 DIAGNOSIS — R29818 Other symptoms and signs involving the nervous system: Secondary | ICD-10-CM | POA: Diagnosis not present

## 2020-02-03 DIAGNOSIS — Z79899 Other long term (current) drug therapy: Secondary | ICD-10-CM

## 2020-02-03 DIAGNOSIS — Z888 Allergy status to other drugs, medicaments and biological substances status: Secondary | ICD-10-CM

## 2020-02-03 DIAGNOSIS — R29702 NIHSS score 2: Secondary | ICD-10-CM | POA: Diagnosis not present

## 2020-02-03 DIAGNOSIS — G459 Transient cerebral ischemic attack, unspecified: Secondary | ICD-10-CM | POA: Diagnosis not present

## 2020-02-03 DIAGNOSIS — Z96612 Presence of left artificial shoulder joint: Secondary | ICD-10-CM | POA: Diagnosis present

## 2020-02-03 DIAGNOSIS — F0390 Unspecified dementia without behavioral disturbance: Secondary | ICD-10-CM | POA: Diagnosis present

## 2020-02-03 DIAGNOSIS — I69334 Monoplegia of upper limb following cerebral infarction affecting left non-dominant side: Secondary | ICD-10-CM

## 2020-02-03 DIAGNOSIS — E7800 Pure hypercholesterolemia, unspecified: Secondary | ICD-10-CM | POA: Diagnosis present

## 2020-02-03 DIAGNOSIS — R4701 Aphasia: Secondary | ICD-10-CM | POA: Diagnosis present

## 2020-02-03 DIAGNOSIS — E854 Organ-limited amyloidosis: Secondary | ICD-10-CM | POA: Diagnosis present

## 2020-02-03 DIAGNOSIS — Z87891 Personal history of nicotine dependence: Secondary | ICD-10-CM

## 2020-02-03 DIAGNOSIS — K922 Gastrointestinal hemorrhage, unspecified: Secondary | ICD-10-CM | POA: Diagnosis present

## 2020-02-03 DIAGNOSIS — I77810 Thoracic aortic ectasia: Secondary | ICD-10-CM | POA: Diagnosis present

## 2020-02-03 DIAGNOSIS — I2584 Coronary atherosclerosis due to calcified coronary lesion: Secondary | ICD-10-CM | POA: Diagnosis present

## 2020-02-03 DIAGNOSIS — Z853 Personal history of malignant neoplasm of breast: Secondary | ICD-10-CM

## 2020-02-03 DIAGNOSIS — R4789 Other speech disturbances: Secondary | ICD-10-CM | POA: Diagnosis present

## 2020-02-03 DIAGNOSIS — N183 Chronic kidney disease, stage 3 unspecified: Secondary | ICD-10-CM | POA: Diagnosis present

## 2020-02-03 DIAGNOSIS — Z90710 Acquired absence of both cervix and uterus: Secondary | ICD-10-CM

## 2020-02-03 DIAGNOSIS — I6523 Occlusion and stenosis of bilateral carotid arteries: Secondary | ICD-10-CM | POA: Diagnosis not present

## 2020-02-03 DIAGNOSIS — Z955 Presence of coronary angioplasty implant and graft: Secondary | ICD-10-CM

## 2020-02-03 DIAGNOSIS — Z8719 Personal history of other diseases of the digestive system: Secondary | ICD-10-CM

## 2020-02-03 DIAGNOSIS — I2510 Atherosclerotic heart disease of native coronary artery without angina pectoris: Secondary | ICD-10-CM | POA: Diagnosis present

## 2020-02-03 DIAGNOSIS — I82B12 Acute embolism and thrombosis of left subclavian vein: Secondary | ICD-10-CM | POA: Diagnosis present

## 2020-02-03 DIAGNOSIS — F40240 Claustrophobia: Secondary | ICD-10-CM | POA: Diagnosis present

## 2020-02-03 DIAGNOSIS — I739 Peripheral vascular disease, unspecified: Secondary | ICD-10-CM | POA: Diagnosis present

## 2020-02-03 DIAGNOSIS — Z8249 Family history of ischemic heart disease and other diseases of the circulatory system: Secondary | ICD-10-CM

## 2020-02-03 DIAGNOSIS — J99 Respiratory disorders in diseases classified elsewhere: Secondary | ICD-10-CM | POA: Diagnosis present

## 2020-02-03 DIAGNOSIS — R4182 Altered mental status, unspecified: Secondary | ICD-10-CM | POA: Diagnosis not present

## 2020-02-03 DIAGNOSIS — Z03818 Encounter for observation for suspected exposure to other biological agents ruled out: Secondary | ICD-10-CM | POA: Diagnosis not present

## 2020-02-03 DIAGNOSIS — E785 Hyperlipidemia, unspecified: Secondary | ICD-10-CM | POA: Diagnosis present

## 2020-02-03 HISTORY — DX: Other nonspecific abnormal finding of lung field: R91.8

## 2020-02-03 LAB — DIFFERENTIAL
Abs Immature Granulocytes: 0.03 10*3/uL (ref 0.00–0.07)
Basophils Absolute: 0.1 10*3/uL (ref 0.0–0.1)
Basophils Relative: 1 %
Eosinophils Absolute: 0.2 10*3/uL (ref 0.0–0.5)
Eosinophils Relative: 3 %
Immature Granulocytes: 0 %
Lymphocytes Relative: 6 %
Lymphs Abs: 0.4 10*3/uL — ABNORMAL LOW (ref 0.7–4.0)
Monocytes Absolute: 0.5 10*3/uL (ref 0.1–1.0)
Monocytes Relative: 8 %
Neutro Abs: 5.5 10*3/uL (ref 1.7–7.7)
Neutrophils Relative %: 82 %

## 2020-02-03 LAB — COMPREHENSIVE METABOLIC PANEL
ALT: 15 U/L (ref 0–44)
AST: 17 U/L (ref 15–41)
Albumin: 3.8 g/dL (ref 3.5–5.0)
Alkaline Phosphatase: 62 U/L (ref 38–126)
Anion gap: 12 (ref 5–15)
BUN: 24 mg/dL — ABNORMAL HIGH (ref 8–23)
CO2: 24 mmol/L (ref 22–32)
Calcium: 8.7 mg/dL — ABNORMAL LOW (ref 8.9–10.3)
Chloride: 104 mmol/L (ref 98–111)
Creatinine, Ser: 1.26 mg/dL — ABNORMAL HIGH (ref 0.44–1.00)
GFR calc Af Amer: 45 mL/min — ABNORMAL LOW (ref >60)
GFR calc non Af Amer: 39 mL/min — ABNORMAL LOW (ref >60)
Glucose, Bld: 115 mg/dL — ABNORMAL HIGH (ref 70–99)
Potassium: 3.9 mmol/L (ref 3.5–5.1)
Sodium: 140 mmol/L (ref 135–145)
Total Bilirubin: 0.6 mg/dL (ref 0.3–1.2)
Total Protein: 6.9 g/dL (ref 6.5–8.1)

## 2020-02-03 LAB — ETHANOL: Alcohol, Ethyl (B): <10 mg/dL (ref <10)

## 2020-02-03 LAB — CBC
HCT: 42.4 % (ref 36.0–46.0)
Hemoglobin: 13.3 g/dL (ref 12.0–15.0)
MCH: 29.8 pg (ref 26.0–34.0)
MCHC: 31.4 g/dL (ref 30.0–36.0)
MCV: 94.9 fL (ref 80.0–100.0)
Platelets: 269 10*3/uL (ref 150–400)
RBC: 4.47 MIL/uL (ref 3.87–5.11)
RDW: 13.6 % (ref 11.5–15.5)
WBC: 6.8 10*3/uL (ref 4.0–10.5)
nRBC: 0 % (ref 0.0–0.2)

## 2020-02-03 LAB — URINALYSIS, ROUTINE W REFLEX MICROSCOPIC
Bilirubin Urine: NEGATIVE
Glucose, UA: NEGATIVE mg/dL
Hgb urine dipstick: NEGATIVE
Ketones, ur: NEGATIVE mg/dL
Leukocytes,Ua: NEGATIVE
Nitrite: NEGATIVE
Protein, ur: NEGATIVE mg/dL
Specific Gravity, Urine: 1.006 (ref 1.005–1.030)
pH: 7 (ref 5.0–8.0)

## 2020-02-03 LAB — RAPID URINE DRUG SCREEN, HOSP PERFORMED
Amphetamines: NOT DETECTED
Barbiturates: NOT DETECTED
Benzodiazepines: NOT DETECTED
Cocaine: NOT DETECTED
Opiates: NOT DETECTED
Tetrahydrocannabinol: NOT DETECTED

## 2020-02-03 LAB — APTT: aPTT: 25 seconds (ref 24–36)

## 2020-02-03 LAB — PROTIME-INR
INR: 1 (ref 0.8–1.2)
Prothrombin Time: 12.9 seconds (ref 11.4–15.2)

## 2020-02-03 MED ORDER — CLOPIDOGREL BISULFATE 75 MG PO TABS
75.0000 mg | ORAL_TABLET | Freq: Once | ORAL | Status: AC
  Administered 2020-02-03: 75 mg via ORAL
  Filled 2020-02-03: qty 1

## 2020-02-03 MED ORDER — ASPIRIN 81 MG PO CHEW
81.0000 mg | CHEWABLE_TABLET | Freq: Once | ORAL | Status: AC
  Administered 2020-02-03: 81 mg via ORAL
  Filled 2020-02-03: qty 1

## 2020-02-03 NOTE — ED Triage Notes (Signed)
Pt LKW 1630 today. PT lives with son. Son came home at [phone] and stated pt was very confused.   PT hx of TIA, stroke. PT appears confused at this time but answers questions appropriately.   Roderic Palau, MD aware.

## 2020-02-03 NOTE — Consult Note (Addendum)
[facility] [employer] Tele[facility]urology Consult Services  Stat Consult  Date of Service:   02/03/2020 22:26:59  Impression:     .  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 She was talking to her family when she became confused, disorientated, dizzy and had trouble getting words out. She was aware of this happening. Last seen normal by family at 16:30. Symptoms lasted 2-3 hours and she currently denies complaints. NIHSS 0, exam unremarkable. CT Head has no acute findings. Pt is not a candidate for alteplase due to being out of the 4.5 hour window. CTA Head/Neck will be followed up to rule out large vessel occlusion. Presentation is concerning for transient ischemic attack or acute ischemic stroke based on history and exam, stroke workup advised. Other possibilities include hypertensive, infectious or metabolic encephalopathy.  CT HEAD: Showed No Acute Hemorrhage or Acute Core Infarct  Metrics: [employer] Notification Time: 02/03/2020 22:26:59 Stamp Time: 02/03/2020 22:26:59 Callback Response Time: 02/03/2020 22:28:53  Our recommendations are outlined below.  Recommendations:     .  Initiate Aspirin 81mg  Daily and Plavix 75mg  Daily for 3 weeks, then revert to monotherapy with plavix     .  Continue pravastatin 20mg  qhs     .  Telemetry monitoring     .  A1C, LDL     .  Normal Saline     .  SCDs for DVT prophylaxis     .  Please allow for permissive HTN for 48 hours. Can use Labetalol 200mg  po q6h prn for SBP>220 or DBP>120 (goal 15% reduction in first 24 hours)     .  Maximize blood glucose control with insulin coverage (goal FS 60-180)     .  Please maintain euthermia. Tylenol prn for temp >100.4  Imaging Studies:     .  MRI Head Without Contrast     .  Echocardiogram -  Transthoracic Echocardiogram  Therapies:     .  Physical Therapy, Occupational Therapy, Speech Therapy Assessment When Applicable  Other WorkUp:     .  Infectious/metabolic workup per primary team  Disposition: Neurology Follow Up Recommended  Sign Out:     .  Discussed with Emergency Department Provider  ----------------------------------------------------------------------------------------------------  Chief Complaint: altered mental status, aphasia, dizziness  History of Present Illness: Patient is a 86 year old Female.  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 She was talking to her family when she became confused, disorientated, dizzy and had trouble getting words out. She was aware of this happening. Last seen normal by family at 16:30. Symptoms lasted 2-3 hours and she currently denies complaints.   Past Medical History:     . Hypertension     . Hyperlipidemia     . Atrial Fibrillation     . Coronary Artery Disease     . Stroke     . There is NO history of Diabetes Mellitus  Anticoagulant use:  No  Antiplatelet use: plavix    Examination: BP(192/72), Pulse(77), Blood Glucose(94) 1A: Level of Consciousness - Alert; keenly responsive + 0 1B: Ask Month and Age - Both Questions Right + 0 1C: Blink Eyes & Squeeze Hands - Performs Both Tasks + 0 2: Test Horizontal Extraocular Movements - Normal +  0 3: Test Visual Fields - No Visual Loss + 0 4: Test Facial Palsy (Use Grimace if Obtunded) - Normal symmetry + 0 5A: Test Left Arm Motor Drift - No Drift for 10 Seconds + 0 5B: Test Right Arm Motor Drift - No Drift for 10 Seconds + 0 6A: Test Left Leg Motor Drift - No Drift for 5 Seconds + 0 6B: Test Right Leg Motor Drift - No Drift for 5 Seconds + 0 7: Test Limb Ataxia (FNF/Heel-Shin) - No Ataxia + 0 8: Test Sensation - Normal; No sensory loss + 0 9:  Test Language/Aphasia - Normal; No aphasia + 0 10: Test Dysarthria - Normal + 0 11: Test Extinction/Inattention - No abnormality + 0  NIHSS Score: 0  Patient/Family was informed the Neurology Consult would occur via TeleHealth consult by way of interactive audio and video telecommunications and consented to receiving care in this manner.  Due to the immediate potential for life-threatening deterioration due to underlying acute neurologic illness, I spent 35 minutes providing critical care. This time includes time for face to face visit via telemedicine, review of medical records, imaging studies and discussion of findings with providers, the patient and/or family.   Dr Lenard Galloway Joeleen Wortley   [employer] [phone]  Case [id number]    ADDENDUM: CTA Head/Neck shows no intracranial large vessel occlusion. 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 No additional neurologic recommendations, proceed with workup as above.

## 2020-02-03 NOTE — ED Notes (Signed)
CT paged at 2217

## 2020-02-03 NOTE — Progress Notes (Signed)
ED phone call [phone] Exam started @ 2217 Exam ended @ 2217 Radiologist called @ 2218  ED Dr Roderic Palau  [phone]

## 2020-02-03 NOTE — ED Provider Notes (Signed)
[facility] EMERGENCY DEPARTMENT Provider Note   CSN: [id number] Arrival date & time: 02/03/20  2200     History Chief Complaint  Patient presents with  . Altered Mental Status    Leslie Carr is a 85 y.o. female.  Patient complains that she got confused and could not get the words out.  This lasted for a few hours but she feels back to normal now  The history is provided by the patient. No language interpreter was used.  Altered Mental Status Presenting symptoms: behavior changes   Severity:  Moderate Most recent episode:  Today Episode history:  Single Timing:  Intermittent Progression:  Waxing and waning Chronicity:  New Context: not alcohol use   Associated symptoms: no abdominal pain, no hallucinations, no headaches, no rash and no seizures        Past Medical History:  Diagnosis Date  . Breast cancer ([place])   . Coronary artery disease due to calcified coronary lesion 09/2018   ACS PCI of OM2 on 09/21/2018 followed by staged orbital atherectomy based PCI of ostial to distal RCA.  (Details in Surgical History)  . Diastolic dysfunction with chronic heart failure ([place])   . History of bronchitis   . Hypercholesteremia   . Mild aortic stenosis by prior echocardiogram 09/2018   Mean gradient 14 million mercury, peak 24 mmHg  . Numbness and tingling in left hand   . Pleural effusion on left   . Shortness of breath dyspnea    "only going up a flight of stairs"  . Stroke [facility]) 2014   left sided weakness - mainly left hand    Patient Active Problem List   Diagnosis Date Noted  . Esophagitis   . Acute blood loss anemia   . Symptomatic anemia 04/19/2019  . AAA (abdominal aortic aneurysm) ([place]) 04/19/2019  . CKD (chronic kidney disease), stage III 04/19/2019  . Gastrointestinal hemorrhage   . NSTEMI (non-ST elevated myocardial infarction) ([place]) 09/21/2018  . Abnormal nuclear stress test   . Unstable angina ([place]) 09/19/2018  . Labile hypertension 09/19/2018  .  Ascending aortic aneurysm ([place]) 09/19/2018  . Chronic diastolic (congestive) heart failure ([place]) 09/19/2018  . Coronary artery disease involving native heart without angina pectoris 09/19/2018  . AKI (acute kidney injury) ([place]) 09/19/2018  . CVA (cerebral vascular accident) ([place]) 04/22/2016  . PAF (paroxysmal atrial fibrillation) ([place]); CHA2DS2-VASc Score = 7.  Patient declines DOAC 04/22/2016  . Mild aortic stenosis by prior echocardiogram 04/22/2016  . Recurrent pleural effusion on left 04/17/2016  . Recurrent left pleural effusion 04/09/2016  . Lung nodule, solitary 04/09/2016  . Atelectasis 04/09/2016  . Status post CVA 04/09/2016  . Hyperlipidemia 04/09/2016  . Heart murmur 04/09/2016  . History of breast cancer 04/09/2016    Past Surgical History:  Procedure Laterality Date  . ABDOMINAL HYSTERECTOMY  1976  . CATARACT EXTRACTION Right   . COLONOSCOPY    . CORONARY ATHERECTOMY N/A 09/24/2018   Procedure: [place] CORONARY STENT PLACEMENT;  Surgeon: Leonie Man, MD;  Location: [place] CV LAB;; staged PCI: Ostial RCA 70%, prox-mid RCA 70% and mid RCA 95 and 50%. ->  Orbital atherectomy followed by 3 overlapping DES stents (proximal to distal): Xience Sierra 2.75 mm a 28 mm (2.9 mm), 2.5 mm x 33 mm (2.8 mm), 2.25 mm a 12 mm (tapered from 2.6 to 2.4 mm)  . CORONARY STENT INTERVENTION N/A 09/21/2018   Procedure: CORONARY STENT INTERVENTION;  Surgeon: Leonie Man, MD;  Location:  [place] IN[facility]AS[facility] CV LAB;  Service: Cardiovascular;; Ostial OM2 85% (DES PCI, Xience Sierra 2.5 mm x 18 mm-minimal residual stenosis)  . DECORTICATION Left 04/17/2016   Procedure: Visceral Pleural DECORTICATION;  Surgeon: Melrose Nakayama, MD;  Location: [place];  Service: Thoracic;  Laterality: Left;  . ESOPHAGOGASTRODUODENOSCOPY N/A 04/20/2019   Procedure: ESOPHAGOGASTRODUODENOSCOPY (EGD);  Surgeon: Rogene Houston, MD;  [facility];  Service: Endoscopy;  Laterality: N/A;  APC-  circumferential  . FOOT SURGERY Right    "bone spur removal"  . LEFT HEART CATH AND CORONARY ANGIOGRAPHY N/A 09/21/2018   Procedure: LEFT HEART CATH AND CORONARY ANGIOGRAPHY;  Surgeon: Leonie Man, MD;  Location: [place] [facility];  Service: Cardiovascular;; Ostial OM2 85% (DES PCI), dLCx 45%.  Lesion #2-3: Ostial RCA 70%, prox-mid RCA 70% and mid RCA 95 and 50%.  Distal RCA 30%, ostial RPDA 30%.  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 MASTECTOMY Left 1985  . PLEURAL BIOPSY Left 04/17/2016   Procedure:  Left PLEURAL BIOPSY;  Surgeon: Melrose Nakayama, MD;  Location: [place];  Service: Thoracic;  Laterality: Left;  . PLEURAL EFFUSION DRAINAGE Left 04/17/2016   Procedure: DRAINAGE OF Left PLEURAL EFFUSION;  Surgeon: Melrose Nakayama, MD;  Location: [place];  Service: Thoracic;  Laterality: Left;  . TONSILLECTOMY    . TRANSTHORACIC ECHOCARDIOGRAM  09/21/2018    EF 60 to 65%.  GR 1 DD.  Mild aortic s stenosis (mean gradient 14 mmHg, peak gradient 24 mmHg).  Mild LA dilation.  Lipomatous hypertrophy of intra-atrial septum.  Marland Kitchen VIDEO ASSISTED THORACOSCOPY Left 04/17/2016   Procedure:  Left VIDEO ASSISTED THORACOSCOPY with Drainage of Pleural Effusion, Pleural Biopsy, Visceral Pleural Decortication and Placement of On-Q pain pump;  Surgeon: Melrose Nakayama, MD;  Location: [place];  Service: Thoracic;  Laterality: Left;     OB History   No obstetric history on file.     Family History  Problem Relation Age of Onset  . Hypertension Mother   . Hypertension Father   . Heart disease Neg Hx   . Heart attack Neg Hx   . Hyperlipidemia Neg Hx   . Heart failure Neg Hx     Social History   Tobacco Use  . Smoking status: Former Research scientist (life sciences)  . Smokeless tobacco: Never Used  . Tobacco comment: Quit over 40 years ago  Substance Use Topics  . Alcohol use: No    Alcohol/week: 0.0 standard drinks  . Drug use: No    Home Medications Prior to Admission medications     Medication Sig Start Date End Date Taking? Authorizing Provider  acetaminophen (TYLENOL) 500 MG tablet Take 500 mg by mouth as needed. For pain when needed.    [provider]  clopidogrel (PLAVIX) 75 MG tablet TAKE 1 TABLET (75 MG TOTAL) BY MOUTH DAILY WITH BREAKFAST. 10/26/19   Kathyrn Drown D, NP  docusate sodium (COLACE) 100 MG capsule Take 100 mg by mouth every evening.    [provider]  Multiple Vitamins-Minerals (EMERGEN-C IMMUNE PLUS PO) Take 1 packet by mouth every evening.     [provider]  pantoprazole (PROTONIX) 40 MG tablet Take 1 tablet (40 mg total) by mouth 2 (two) times daily before a meal. 04/22/19   Barton Dubois, MD  pravastatin (PRAVACHOL) 20 MG tablet Take 20 mg by mouth every evening.  02/16/19   [provider]    Allergies    Lipitor [atorvastatin]  Review  of Systems   Review of Systems  Constitutional: Negative for appetite change and fatigue.  HENT: Negative for congestion, ear discharge and sinus pressure.   Eyes: Negative for discharge.  Respiratory: Negative for cough.   Cardiovascular: Negative for chest pain.  Gastrointestinal: Negative for abdominal pain and diarrhea.  Genitourinary: Negative for frequency and hematuria.  Musculoskeletal: Negative for back pain.  Skin: Negative for rash.  Neurological: Negative for seizures and headaches.       Confusion  Psychiatric/Behavioral: Negative for hallucinations.    Physical Exam Updated Vital Signs BP (!) 192/72   Pulse 77   Temp 98.8 F (37.1 C) (Oral)   Resp 16   Ht 5\' 3"  (1.6 m)   Wt 64 kg   SpO2 99%   BMI 24.99 kg/m   Physical Exam Vitals and nursing note reviewed.  Constitutional:      Appearance: She is well-developed.  HENT:     Head: Normocephalic.     Nose: Nose normal.  Eyes:     General: No scleral icterus.    Conjunctiva/sclera: Conjunctivae normal.  Neck:     Thyroid: No thyromegaly.  Cardiovascular:     Rate and Rhythm: Normal rate  and regular rhythm.     Heart sounds: No murmur. No friction rub. No gallop.   Pulmonary:     Breath sounds: No stridor. No wheezing or rales.  Chest:     Chest wall: No tenderness.  Abdominal:     General: There is no distension.     Tenderness: There is no abdominal tenderness. There is no rebound.  Musculoskeletal:        General: Normal range of motion.     Cervical back: Neck supple.  Lymphadenopathy:     Cervical: No cervical adenopathy.  Skin:    Findings: No erythema or rash.  Neurological:     Mental Status: She is oriented to person, place, and time.     Motor: No abnormal muscle tone.     Coordination: Coordination normal.  Psychiatric:        Behavior: Behavior normal.     ED Results / Procedures / Treatments   Labs (all labs ordered are listed, but only abnormal results are displayed) Labs Reviewed  DIFFERENTIAL - Abnormal; Notable for the following components:      Result Value   Lymphs Abs 0.4 (*)    All other components within normal limits  RESPIRATORY PANEL BY RT PCR (FLU A&B, COVID)  CBC  ETHANOL  PROTIME-INR  APTT  COMPREHENSIVE METABOLIC PANEL  RAPID URINE DRUG SCREEN, HOSP PERFORMED  URINALYSIS, ROUTINE W REFLEX MICROSCOPIC  I-STAT CHEM 8, ED    EKG None  Radiology CT HEAD CODE STROKE WO CONTRAST  Result Date: 02/03/2020 CLINICAL DATA:  Code stroke. Ataxia of beginning a acutely 1630 hours. Confusion. EXAM: CT HEAD WITHOUT CONTRAST TECHNIQUE: Contiguous axial images were obtained from the base of the skull through the vertex without intravenous contrast. COMPARISON:  None. FINDINGS: Brain: Generalized brain atrophy and chronic small-vessel disease of the cerebral hemispheric white matter. Old lacunar infarctions of the basal ganglia. Old small vessel infarction of the left cerebellum. No sign of acute infarction, mass lesion, hemorrhage hydrocephalus or extra-axial collection. Vascular: There is atherosclerotic calcification of the major vessels  at the base of the brain. Skull: Negative Sinuses/Orbits: Clear/normal Other: None ASPECTS ([place] Stroke Program Early CT Score) - Ganglionic level infarction (caudate, lentiform nuclei, internal capsule, insula, M1-M3 cortex): 7 - Supraganglionic infarction (M4-M6  cortex): 3 Total score (0-10 with 10 being normal): 10 IMPRESSION: 1. No acute CT finding. Atrophy and chronic small-vessel ischemic changes. 2. ASPECTS is 10. 3. These results were called by telephone at the time of interpretation on 02/03/2020 at 10:33 pm to provider Somnang Mahan , who verbally acknowledged these results. Electronically Signed   By: Nelson Chimes M.D.   On: 02/03/2020 22:35    Procedures Procedures (including critical care time)  Medications Ordered in ED Medications - No data to display  ED Course  I have reviewed the triage vital signs and the nursing notes.  Pertinent labs & imaging results that were available during my care of the patient were reviewed by me and considered in my medical decision making (see chart for details). CRITICAL CARE Performed by: Milton Ferguson Total critical care time: 45 minutes Critical care time was exclusive of separately billable procedures and treating other patients. Critical care was necessary to treat or prevent imminent or life-threatening deterioration. Critical care was time spent personally by me on the following activities: development of treatment plan with patient and/or surrogate as well as nursing, discussions with consultants, evaluation of patient's response to treatment, examination of patient, obtaining history from patient or surrogate, ordering and performing treatments and interventions, ordering and review of laboratory studies, ordering and review of radiographic studies, pulse oximetry and re-evaluation of patient's condition.   Patient with confusion that has resolved.  She will see neurology MDM Rules/Calculators/A&P                      Patient was seen  by neurology and she was admitted for stroke work-up by medicine Final Clinical Impression(s) / ED Diagnoses Final diagnoses:  None    Rx / DC Orders ED Discharge Orders    None       Milton Ferguson, MD 02/05/20 [phone]

## 2020-02-04 ENCOUNTER — Emergency Department (HOSPITAL_COMMUNITY): Payer: Medicare Other

## 2020-02-04 ENCOUNTER — Observation Stay (HOSPITAL_COMMUNITY): Payer: Medicare Other

## 2020-02-04 ENCOUNTER — Encounter (HOSPITAL_COMMUNITY): Payer: Self-pay | Admitting: Internal Medicine

## 2020-02-04 ENCOUNTER — Observation Stay (HOSPITAL_BASED_OUTPATIENT_CLINIC_OR_DEPARTMENT_OTHER): Payer: Medicare Other

## 2020-02-04 DIAGNOSIS — I2510 Atherosclerotic heart disease of native coronary artery without angina pectoris: Secondary | ICD-10-CM

## 2020-02-04 DIAGNOSIS — Z853 Personal history of malignant neoplasm of breast: Secondary | ICD-10-CM | POA: Diagnosis not present

## 2020-02-04 DIAGNOSIS — Z955 Presence of coronary angioplasty implant and graft: Secondary | ICD-10-CM | POA: Diagnosis not present

## 2020-02-04 DIAGNOSIS — G9341 Metabolic encephalopathy: Secondary | ICD-10-CM

## 2020-02-04 DIAGNOSIS — G459 Transient cerebral ischemic attack, unspecified: Secondary | ICD-10-CM | POA: Diagnosis not present

## 2020-02-04 DIAGNOSIS — Z20822 Contact with and (suspected) exposure to covid-19: Secondary | ICD-10-CM | POA: Diagnosis not present

## 2020-02-04 DIAGNOSIS — J99 Respiratory disorders in diseases classified elsewhere: Secondary | ICD-10-CM | POA: Diagnosis not present

## 2020-02-04 DIAGNOSIS — I350 Nonrheumatic aortic (valve) stenosis: Secondary | ICD-10-CM | POA: Diagnosis not present

## 2020-02-04 DIAGNOSIS — R4789 Other speech disturbances: Secondary | ICD-10-CM | POA: Diagnosis not present

## 2020-02-04 DIAGNOSIS — N183 Chronic kidney disease, stage 3 unspecified: Secondary | ICD-10-CM | POA: Diagnosis not present

## 2020-02-04 DIAGNOSIS — Z87891 Personal history of nicotine dependence: Secondary | ICD-10-CM | POA: Diagnosis not present

## 2020-02-04 DIAGNOSIS — E785 Hyperlipidemia, unspecified: Secondary | ICD-10-CM | POA: Diagnosis not present

## 2020-02-04 DIAGNOSIS — I739 Peripheral vascular disease, unspecified: Secondary | ICD-10-CM | POA: Diagnosis not present

## 2020-02-04 DIAGNOSIS — R29702 NIHSS score 2: Secondary | ICD-10-CM | POA: Diagnosis not present

## 2020-02-04 DIAGNOSIS — I77810 Thoracic aortic ectasia: Secondary | ICD-10-CM | POA: Diagnosis not present

## 2020-02-04 DIAGNOSIS — E854 Organ-limited amyloidosis: Secondary | ICD-10-CM | POA: Diagnosis not present

## 2020-02-04 DIAGNOSIS — I130 Hypertensive heart and chronic kidney disease with heart failure and stage 1 through stage 4 chronic kidney disease, or unspecified chronic kidney disease: Secondary | ICD-10-CM | POA: Diagnosis not present

## 2020-02-04 DIAGNOSIS — I82B12 Acute embolism and thrombosis of left subclavian vein: Secondary | ICD-10-CM | POA: Diagnosis not present

## 2020-02-04 DIAGNOSIS — I480 Paroxysmal atrial fibrillation: Secondary | ICD-10-CM | POA: Diagnosis not present

## 2020-02-04 DIAGNOSIS — M47819 Spondylosis without myelopathy or radiculopathy, site unspecified: Secondary | ICD-10-CM | POA: Diagnosis not present

## 2020-02-04 DIAGNOSIS — R918 Other nonspecific abnormal finding of lung field: Secondary | ICD-10-CM | POA: Diagnosis not present

## 2020-02-04 DIAGNOSIS — Z8719 Personal history of other diseases of the digestive system: Secondary | ICD-10-CM | POA: Diagnosis not present

## 2020-02-04 DIAGNOSIS — E7800 Pure hypercholesterolemia, unspecified: Secondary | ICD-10-CM | POA: Diagnosis not present

## 2020-02-04 DIAGNOSIS — Z888 Allergy status to other drugs, medicaments and biological substances status: Secondary | ICD-10-CM | POA: Diagnosis not present

## 2020-02-04 DIAGNOSIS — I708 Atherosclerosis of other arteries: Secondary | ICD-10-CM | POA: Diagnosis not present

## 2020-02-04 DIAGNOSIS — Z9012 Acquired absence of left breast and nipple: Secondary | ICD-10-CM | POA: Diagnosis not present

## 2020-02-04 DIAGNOSIS — I5042 Chronic combined systolic (congestive) and diastolic (congestive) heart failure: Secondary | ICD-10-CM | POA: Diagnosis not present

## 2020-02-04 DIAGNOSIS — I340 Nonrheumatic mitral (valve) insufficiency: Secondary | ICD-10-CM | POA: Diagnosis not present

## 2020-02-04 DIAGNOSIS — R4701 Aphasia: Secondary | ICD-10-CM | POA: Diagnosis not present

## 2020-02-04 HISTORY — DX: Metabolic encephalopathy: G93.41

## 2020-02-04 LAB — ECHOCARDIOGRAM COMPLETE
Height: 63 in
Weight: 2257.51 oz

## 2020-02-04 LAB — LIPID PANEL
Cholesterol: 141 mg/dL (ref 0–200)
HDL: 48 mg/dL (ref >40)
LDL Cholesterol: 80 mg/dL (ref 0–99)
Total CHOL/HDL Ratio: 2.9 RATIO
Triglycerides: 63 mg/dL (ref <150)
VLDL: 13 mg/dL (ref 0–40)

## 2020-02-04 LAB — RESPIRATORY PANEL BY RT PCR (FLU A&B, COVID)
Influenza A by PCR: NEGATIVE
Influenza B by PCR: NEGATIVE
SARS Coronavirus 2 by RT PCR: NEGATIVE

## 2020-02-04 LAB — HEMOGLOBIN A1C
Hgb A1c MFr Bld: 5.6 % (ref 4.8–5.6)
Mean Plasma Glucose: 114.02 mg/dL

## 2020-02-04 MED ORDER — ACETAMINOPHEN 325 MG PO TABS: 650.0000 mg | ORAL_TABLET | ORAL | Status: DC | PRN

## 2020-02-04 MED ORDER — ACETAMINOPHEN 650 MG RE SUPP: 650.0000 mg | RECTAL | Status: DC | PRN

## 2020-02-04 MED ORDER — STROKE: EARLY STAGES OF RECOVERY BOOK
Freq: Once | Status: AC
  Filled 2020-02-04 (×2): qty 1

## 2020-02-04 MED ORDER — IOHEXOL 350 MG/ML SOLN
64.0000 mL | Freq: Once | INTRAVENOUS | Status: AC | PRN
  Administered 2020-02-04: 01:00:00 64 mL via INTRAVENOUS

## 2020-02-04 MED ORDER — ACETAMINOPHEN 160 MG/5ML PO SOLN: 650.0000 mg | ORAL | Status: DC | PRN

## 2020-02-04 NOTE — ED Notes (Signed)
Pt's daughter updated about pt's status and bed assignment at [facility] at this time.

## 2020-02-04 NOTE — H&P (Signed)
History and Physical    Leslie Carr [id number] DOB: 10/14/1931 DOA: 02/03/2020  PCP: [place], [place] (Confirm with patient/family/NH records and if not entered, this has to be entered at [facility] point of entry) Patient coming from: Home  I have personally briefly reviewed patient's old medical records in [place]  Chief Complaint: Altered mental status and aphasia  HPI: Leslie Carr is a 86 y.o. female with medical history significant of hypertension, hyperlipidemia, atrial fibrillation not on any anticoagulation, CVA with residual left arm weakness and coronary artery disease presented to ED for evaluation of altered mental status and aphasia.  During my evaluation, patient is awake alert and oriented to time person and place and at her baseline.  Patient states that she was talking to her daughter and son-in-law when suddenly she felt that something is wrong and became confused and dizzy and was unable to speak.  Patient states that the symptoms lasted for 2 to 3 hours and then completely resolved.  Patient denies any fever, chills, headache, blurriness of vision, weakness, numbness and tingling sensation in any part of her body and also denied any recent injury to her head.  Patient also denies chest pain, shortness of breath, nausea, vomiting, abdominal pain and urinary symptoms.  ED Course: In ED on arrival patient had temperature of 98.8, blood pressure 192/72 that improved to 122/86 by itself, heart rate 77, respiratory rate 16 and oxygen saturation of 95% on room air.  Blood work showed hemoglobin 13.3, BUN 24, creatinine 1.26, glucose 115.  UDS was negative.  CT head was negative for acute intracranial bleed or pathology but showed chronic atrophy and chronic small vessel ischemic changes.  ED physician contacted telemetry neurologist to assess the patient and the symptoms were almost resolved at that point and patient was also out of the TPA window.   Telemetry neurologist recommended CTA head and neck that was negative for large or medium vessel occlusion and stenosis.  Telemetry neurologist further recommended to admit the patient for TIA/acute ischemic stroke work-up and evaluation by neurologist.  Patient was given aspirin and Plavix in the ED.  Note: As there is no MRI available in any main hospital the patient will be transferred to [facility].  I called the hospitalist Dr. Maudie Mercury and he accepted the patient in [place].  Review of Systems: As per HPI otherwise 10 point review of systems negative.    Past Medical History:  Diagnosis Date  . Acute metabolic encephalopathy [id number]  . Breast cancer ([place])   . Coronary artery disease due to calcified coronary lesion 09/2018   ACS PCI of OM2 on 09/21/2018 followed by staged orbital atherectomy based PCI of ostial to distal RCA.  (Details in Surgical History)  . Diastolic dysfunction with chronic heart failure ([place])   . History of bronchitis   . Hypercholesteremia   . Mild aortic stenosis by prior echocardiogram 09/2018   Mean gradient 14 million mercury, peak 24 mmHg  . Numbness and tingling in left hand   . Pleural effusion on left   . Shortness of breath dyspnea    "only going up a flight of stairs"  . Stroke [facility]) 2014   left sided weakness - mainly left hand    Past Surgical History:  Procedure Laterality Date  . ABDOMINAL HYSTERECTOMY  1976  . CATARACT EXTRACTION Right   . COLONOSCOPY    . CORONARY ATHERECTOMY N/A 09/24/2018   Procedure: CORONARY ATHERECTOMY-WITH CORONARY STENT  PLACEMENT;  Surgeon: Leonie Man, MD;  Location: [place] [facility];; staged PCI: Ostial RCA 70%, prox-mid RCA 70% and mid RCA 95 and 50%. ->  Orbital atherectomy followed by 3 overlapping DES stents (proximal to distal): Xience Sierra 2.75 mm a 28 mm (2.9 mm), 2.5 mm x 33 mm (2.8 mm), 2.25 mm a 12 mm (tapered from 2.6 to 2.4 mm)  . CORONARY STENT INTERVENTION N/A 09/21/2018   Procedure:  CORONARY STENT INTERVENTION;  Surgeon: Leonie Man, MD;  Location: [place] [facility];  Service: Cardiovascular;; Ostial OM2 85% (DES PCI, Xience Sierra 2.5 mm x 18 mm-minimal residual stenosis)  . DECORTICATION Left 04/17/2016   Procedure: Visceral Pleural DECORTICATION;  Surgeon: Melrose Nakayama, MD;  Location: [place];  Service: Thoracic;  Laterality: Left;  . ESOPHAGOGASTRODUODENOSCOPY N/A 04/20/2019   Procedure: ESOPHAGOGASTRODUODENOSCOPY (EGD);  Surgeon: Rogene Houston, MD;  [facility];  Service: Endoscopy;  Laterality: N/A;  APC- circumferential  . FOOT SURGERY Right    "bone spur removal"  . LEFT HEART CATH AND CORONARY ANGIOGRAPHY N/A 09/21/2018   Procedure: LEFT HEART CATH AND CORONARY ANGIOGRAPHY;  Surgeon: Leonie Man, MD;  Location: [place] [facility];  Service: Cardiovascular;; Ostial OM2 85% (DES PCI), dLCx 45%.  Lesion #2-3: Ostial RCA 70%, prox-mid RCA 70% and mid RCA 95 and 50%.  Distal RCA 30%, ostial RPDA 30%.  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 MASTECTOMY Left 1985  . PLEURAL BIOPSY Left 04/17/2016   Procedure:  Left PLEURAL BIOPSY;  Surgeon: Melrose Nakayama, MD;  Location: [place];  Service: Thoracic;  Laterality: Left;  . PLEURAL EFFUSION DRAINAGE Left 04/17/2016   Procedure: DRAINAGE OF Left PLEURAL EFFUSION;  Surgeon: Melrose Nakayama, MD;  Location: [place];  Service: Thoracic;  Laterality: Left;  . TONSILLECTOMY    . TRANSTHORACIC ECHOCARDIOGRAM  09/21/2018    EF 60 to 65%.  GR 1 DD.  Mild aortic s stenosis (mean gradient 14 mmHg, peak gradient 24 mmHg).  Mild LA dilation.  Lipomatous hypertrophy of intra-atrial septum.  Marland Kitchen VIDEO ASSISTED THORACOSCOPY Left 04/17/2016   Procedure:  Left VIDEO ASSISTED THORACOSCOPY with Drainage of Pleural Effusion, Pleural Biopsy, Visceral Pleural Decortication and Placement of On-Q pain pump;  Surgeon: Melrose Nakayama, MD;  Location: [place];  Service: Thoracic;  Laterality: Left;       reports that she has quit smoking. She has never used smokeless tobacco. She reports that she does not drink alcohol or use drugs.  Allergies  Allergen Reactions  . Lipitor [Atorvastatin] Other (See Comments)    Dizzy and make her head feel huge     Family History  Problem Relation Age of Onset  . Hypertension Mother   . Hypertension Father   . Heart disease Neg Hx   . Heart attack Neg Hx   . Hyperlipidemia Neg Hx   . Heart failure Neg Hx      Prior to Admission medications   Medication Sig Start Date End Date Taking? Authorizing Provider  acetaminophen (TYLENOL) 500 MG tablet Take 500 mg by mouth as needed. For pain when needed.    [provider]  clopidogrel (PLAVIX) 75 MG tablet TAKE 1 TABLET (75 MG TOTAL) BY MOUTH DAILY WITH BREAKFAST. 10/26/19   Kathyrn Drown D, NP  docusate sodium (COLACE) 100 MG capsule Take 100 mg by mouth every evening.    [provider]  Multiple Vitamins-Minerals (EMERGEN-C IMMUNE PLUS PO)  Take 1 packet by mouth every evening.     [provider]  pantoprazole (PROTONIX) 40 MG tablet Take 1 tablet (40 mg total) by mouth 2 (two) times daily before a meal. 04/22/19   Barton Dubois, MD  pravastatin (PRAVACHOL) 20 MG tablet Take 20 mg by mouth every evening.  02/16/19   [provider]    Physical Exam: Vitals:   02/03/20 2345 02/03/20 2347 02/04/20 0254 02/04/20 0300  BP: 121/86 121/86 (!) 173/88 (!) 175/91  Pulse:  78 90 75  Resp: (!) 25 18    Temp:      TempSrc:      SpO2:  98% 98% 100%  Weight:      Height:        Constitutional: NAD, calm, comfortable Vitals:   02/03/20 2345 02/03/20 2347 02/04/20 0254 02/04/20 0300  BP: 121/86 121/86 (!) 173/88 (!) 175/91  Pulse:  78 90 75  Resp: (!) 25 18    Temp:      TempSrc:      SpO2:  98% 98% 100%  Weight:      Height:       General: Patient is awake, alert and oriented and in no acute distress. Eyes: PERRL, lids and conjunctivae normal ENMT: Mucous  membranes are moist. Posterior pharynx clear of any exudate or lesions.Normal dentition.  Neck: normal, supple, no masses, no thyromegaly Respiratory: clear to auscultation bilaterally, no wheezing, no crackles. Normal respiratory effort. No accessory muscle use.  Cardiovascular: Regular rate and rhythm, no murmurs / rubs / gallops. No extremity edema. 2+ pedal pulses. No carotid bruits.  Abdomen: no tenderness, no masses palpated. No hepatosplenomegaly. Bowel sounds positive.  Musculoskeletal: no clubbing / cyanosis. No joint deformity upper and lower extremities. Good ROM, no contractures. Normal muscle tone.  Skin: no rashes, lesions, ulcers. No induration Neurologic: .  Patient is awake, alert and oriented to time person and place. CN 2-12 grossly intact.  Normal speech.  Sensation intact, DTR normal. Strength is 3/5 in left upper extremity while 5/5 in rest of extremities.  Psychiatric: Normal judgment and insight. Alert and oriented x 3. Normal mood.    Labs on Admission: I have personally reviewed following labs and imaging studies  CBC: Recent Labs  Lab 02/03/20 2234  WBC 6.8  NEUTROABS 5.5  HGB 13.3  HCT 42.4  MCV 94.9  PLT Q000111Q   Basic Metabolic Panel: Recent Labs  Lab 02/03/20 2234  NA 140  K 3.9  CL 104  CO2 24  GLUCOSE 115*  BUN 24*  CREATININE 1.26*  CALCIUM 8.7*   GFR: Estimated Creatinine Clearance: 28.8 mL/min (A) (by C-G formula based on SCr of 1.26 mg/dL (H)). Liver Function Tests: Recent Labs  Lab 02/03/20 2234  AST 17  ALT 15  ALKPHOS 62  BILITOT 0.6  PROT 6.9  ALBUMIN 3.8   No results for input(s): LIPASE, AMYLASE in the last 168 hours. No results for input(s): AMMONIA in the last 168 hours. Coagulation Profile: Recent Labs  Lab 02/03/20 2234  INR 1.0   Cardiac Enzymes: No results for input(s): CKTOTAL, CKMB, CKMBINDEX, TROPONINI in the last 168 hours. BNP (last 3 results) No results for input(s): PROBNP in the last 8760  hours. HbA1C: No results for input(s): HGBA1C in the last 72 hours. CBG: No results for input(s): GLUCAP in the last 168 hours. Lipid Profile: No results for input(s): CHOL, HDL, LDLCALC, TRIG, CHOLHDL, LDLDIRECT in the last 72 hours. Thyroid Function Tests: No  results for input(s): TSH, T4TOTAL, FREET4, T3FREE, THYROIDAB in the last 72 hours. Anemia Panel: No results for input(s): VITAMINB12, FOLATE, FERRITIN, TIBC, IRON, RETICCTPCT in the last 72 hours. Urine analysis:    Component Value Date/Time   COLORURINE STRAW (A) 02/03/2020 2256   APPEARANCEUR CLEAR 02/03/2020 2256   LABSPEC 1.006 02/03/2020 2256   PHURINE 7.0 02/03/2020 2256   GLUCOSEU NEGATIVE 02/03/2020 2256   HGBUR NEGATIVE 02/03/2020 2256   BILIRUBINUR NEGATIVE 02/03/2020 2256   [place] 02/03/2020 2256   PROTEINUR NEGATIVE 02/03/2020 2256   NITRITE NEGATIVE 02/03/2020 2256   LEUKOCYTESUR NEGATIVE 02/03/2020 2256    Radiological Exams on Admission: CT Angio Head W or Wo Contrast  Result Date: 02/04/2020 CLINICAL DATA:  Acute onset of confusion and disorientation. No acute finding by head CT. EXAM: CT ANGIOGRAPHY HEAD AND NECK TECHNIQUE: Multidetector CT imaging of the head and neck was performed using the standard protocol during bolus administration of intravenous contrast. Multiplanar CT image reconstructions and MIPs were obtained to evaluate the vascular anatomy. Carotid stenosis measurements (when applicable) are obtained utilizing NASCET criteria, using the distal internal carotid diameter as the denominator. CONTRAST:  45mL OMNIPAQUE IOHEXOL 350 MG/ML SOLN COMPARISON:  Head CT 02/03/2020 FINDINGS: CTA NECK FINDINGS Aortic arch: Pronounced aortic atherosclerosis with irregular plaque. Ascending aortic diameter as large as 3.7 cm in the region visualized. No origin stenosis of the innominate or left common carotid artery. 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 Right carotid system: Common carotid artery is patent till the bifurcation region. Extensive soft and calcified plaque just proximal to the bifurcation with luminal diameter as narrow as 2.3 mm. At the bifurcation, there is calcified plaque extending into the ICA bulb with minimal diameter of the distal bulb measuring 1.9 mm. More distal cervical ICA is widely patent with a diameter of 5 mm. Therefore, there is 60% stenosis. Left carotid system: Common carotid artery shows some plaque but is widely patent to the bifurcation region. Calcified plaque at the carotid bifurcation and throughout the ICA bulb. Minimal diameter of the distal bulb measures 2 mm. Compared to a more distal ICA diameter of 4 mm, this indicates a 50% stenosis. Cervical ICA is widely patent beyond that. Vertebral arteries: Moderate stenosis of the proximal right subclavian artery, estimated at 30%. Calcified plaque affecting the right vertebral artery origin with stenosis of 70%. This dominant vessel is widely patent beyond that to the foramen magnum. As noted above, there is long segment stenosis of the proximal left subclavian artery due to circumferential plaque, with luminal diameter of only 2.5 mm. Left subclavian artery is completely occluded at about the expected level of the left vertebral artery origin. The left vertebral artery origin is occluded. There is some reconstituted flow in the mid to distal vertebral artery secondary 2 cervical collaterals. Vessel does show flow at the foramen magnum. Skeleton: Ordinary cervical spondylosis. Other neck: No soft tissue mass or lymphadenopathy. Dense calcification within the left thyroid lobe likely benign. Upper chest: Pleural and parenchymal scarring at the lung apices left worse than right. Review of the MIP images confirms the above findings CTA HEAD FINDINGS Anterior circulation: Both internal carotid arteries are patent through the skull  base and siphon regions. There is ordinary siphon atherosclerotic calcification with stenosis estimated at 30%. The anterior and middle cerebral vessels are patent. No large or medium vessel occlusion. No correctable proximal intracranial vessel stenosis. Posterior circulation: Dominant  right vertebral artery is widely patent to the basilar. Non dominant reconstituted left vertebral artery shows 70% stenosis in the V4 segment but does give some supply to the basilar. Dominant PICA arises from the left vertebral artery. No basilar stenosis. Superior cerebellar and posterior cerebral arteries show flow. There is some atherosclerotic narrowing and irregularity of the PCA branches. Venous sinuses: Patent and normal. Anatomic variants: None other significant. Review of the MIP images confirms the above findings IMPRESSION: No intracranial large or medium vessel occlusion. Advanced irregular atherosclerotic disease of the aorta. Dilatation of the ascending aorta with diameter as large as 3.7 cm in the region studied. Atherosclerotic disease at both carotid bifurcations and ICA bulb regions. 50% stenosis of the left ICA bulb. 60% stenosis of the right carotid bifurcation and ICA bulb. 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 Circumferential mural thrombus affecting the proximal 3.5 cm of the vessel, with patent lumen through that region of only 2.5 mm. Complete occlusion of the subclavian artery just beyond that. 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 Reconstitution in the mid upper cervical region by cervical collaterals, with subsequent antegrade flow beyond that. 70% stenosis of the left V4 segment. 30% stenosis of the proximal right subclavian artery. 70% stenosis of the right vertebral artery origin but wide patency beyond that to the basilar. Electronically Signed   By: Nelson Chimes M.D.   On: 02/04/2020 00:57   CT Angio Neck W and/or Wo Contrast  Result Date: 02/04/2020 CLINICAL  DATA:  Acute onset of confusion and disorientation. No acute finding by head CT. EXAM: CT ANGIOGRAPHY HEAD AND NECK TECHNIQUE: Multidetector CT imaging of the head and neck was performed using the standard protocol during bolus administration of intravenous contrast. Multiplanar CT image reconstructions and MIPs were obtained to evaluate the vascular anatomy. Carotid stenosis measurements (when applicable) are obtained utilizing NASCET criteria, using the distal internal carotid diameter as the denominator. CONTRAST:  43mL OMNIPAQUE IOHEXOL 350 MG/ML SOLN COMPARISON:  Head CT 02/03/2020 FINDINGS: CTA NECK FINDINGS Aortic arch: Pronounced aortic atherosclerosis with irregular plaque. Ascending aortic diameter as large as 3.7 cm in the region visualized. No origin stenosis of the innominate or left common carotid artery. 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 Right carotid system: Common carotid artery is patent till the bifurcation region. Extensive soft and calcified plaque just proximal to the bifurcation with luminal diameter as narrow as 2.3 mm. At the bifurcation, there is calcified plaque extending into the ICA bulb with minimal diameter of the distal bulb measuring 1.9 mm. More distal cervical ICA is widely patent with a diameter of 5 mm. Therefore, there is 60% stenosis. Left carotid system: Common carotid artery shows some plaque but is widely patent to the bifurcation region. Calcified plaque at the carotid bifurcation and throughout the ICA bulb. Minimal diameter of the distal bulb measures 2 mm. Compared to a more distal ICA diameter of 4 mm, this indicates a 50% stenosis. Cervical ICA is widely patent beyond that. Vertebral arteries: Moderate stenosis of the proximal right subclavian artery, estimated at 30%. Calcified plaque affecting the right vertebral artery origin with stenosis of 70%. This dominant vessel is  widely patent beyond that to the foramen magnum. As noted above, there is long segment stenosis of the proximal left subclavian artery due to circumferential plaque, with luminal diameter of only 2.5 mm. Left subclavian artery is completely occluded at about the expected level  of the left vertebral artery origin. The left vertebral artery origin is occluded. There is some reconstituted flow in the mid to distal vertebral artery secondary 2 cervical collaterals. Vessel does show flow at the foramen magnum. Skeleton: Ordinary cervical spondylosis. Other neck: No soft tissue mass or lymphadenopathy. Dense calcification within the left thyroid lobe likely benign. Upper chest: Pleural and parenchymal scarring at the lung apices left worse than right. Review of the MIP images confirms the above findings CTA HEAD FINDINGS Anterior circulation: Both internal carotid arteries are patent through the skull base and siphon regions. There is ordinary siphon atherosclerotic calcification with stenosis estimated at 30%. The anterior and middle cerebral vessels are patent. No large or medium vessel occlusion. No correctable proximal intracranial vessel stenosis. Posterior circulation: Dominant right vertebral artery is widely patent to the basilar. Non dominant reconstituted left vertebral artery shows 70% stenosis in the V4 segment but does give some supply to the basilar. Dominant PICA arises from the left vertebral artery. No basilar stenosis. Superior cerebellar and posterior cerebral arteries show flow. There is some atherosclerotic narrowing and irregularity of the PCA branches. Venous sinuses: Patent and normal. Anatomic variants: None other significant. Review of the MIP images confirms the above findings IMPRESSION: No intracranial large or medium vessel occlusion. Advanced irregular atherosclerotic disease of the aorta. Dilatation of the ascending aorta with diameter as large as 3.7 cm in the region studied.  Atherosclerotic disease at both carotid bifurcations and ICA bulb regions. 50% stenosis of the left ICA bulb. 60% stenosis of the right carotid bifurcation and ICA bulb. 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 Circumferential mural thrombus affecting the proximal 3.5 cm of the vessel, with patent lumen through that region of only 2.5 mm. Complete occlusion of the subclavian artery just beyond that. 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 Reconstitution in the mid upper cervical region by cervical collaterals, with subsequent antegrade flow beyond that. 70% stenosis of the left V4 segment. 30% stenosis of the proximal right subclavian artery. 70% stenosis of the right vertebral artery origin but wide patency beyond that to the basilar. Electronically Signed   By: Nelson Chimes M.D.   On: 02/04/2020 00:57   CT HEAD CODE STROKE WO CONTRAST  Result Date: 02/03/2020 CLINICAL DATA:  Code stroke. Ataxia of beginning a acutely 1630 hours. Confusion. EXAM: CT HEAD WITHOUT CONTRAST TECHNIQUE: Contiguous axial images were obtained from the base of the skull through the vertex without intravenous contrast. COMPARISON:  None. FINDINGS: Brain: Generalized brain atrophy and chronic small-vessel disease of the cerebral hemispheric white matter. Old lacunar infarctions of the basal ganglia. Old small vessel infarction of the left cerebellum. No sign of acute infarction, mass lesion, hemorrhage hydrocephalus or extra-axial collection. Vascular: There is atherosclerotic calcification of the major vessels at the base of the brain. Skull: Negative Sinuses/Orbits: Clear/normal Other: None ASPECTS ([place] Stroke Program Early CT Score) - Ganglionic level infarction (caudate, lentiform nuclei, internal capsule, insula, M1-M3 cortex): 7 - Supraganglionic infarction (M4-M6 cortex): 3 Total score (0-10 with 10 being normal): 10 IMPRESSION: 1. No acute CT finding. Atrophy and chronic small-vessel ischemic  changes. 2. ASPECTS is 10. 3. These results were called by telephone at the time of interpretation on 02/03/2020 at 10:33 pm to provider JOSEPH ZAMMIT , who verbally acknowledged these results. Electronically Signed   By: Nelson Chimes M.D.   On: 02/03/2020 22:35      Assessment/Plan Principal Problem:    TIA (transient ischemic attack) Patient presented  with confusion, aphasia and dizziness and the symptoms resolved completely within 4 hours. CT head was negative for acute intracranial pathology. CTA head and neck negative for occlusion or stenosis of large and medial vessels. Telemetry neurologist recommended admission and stroke work-up. MRI brain ordered Echocardiogram ordered Patient will continue taking aspirin and Plavix for 3 weeks and after 3 weeks patient will only stay on Plavix as per telemetry neurologist recommendations. Patient is also started on rosuvastatin 20 mg daily. Neurology consult ordered as per recommendation by telemetry neurologist for evaluation. Lipid panel and hemoglobin A1c ordered. We will keep an eye on blood pressure and will treat the blood pressure with labetalol only if the systolic blood pressure is above [id number] or diastolic above [id number].  Active Problems:    Hyperlipidemia Continue rosuvastatin 20 mg nightly    Acute metabolic encephalopathy Acute metabolic encephalopathy is completely resolved.  Patient is no more confused and able to answer all the questions appropriately.  CT head was negative for acute intracranial pathology.     DVT prophylaxis: Telemetry neurologist recommended SCDs for DVT prophylaxis. Code Status: Full code  Disposition Plan: Will be discharged to home after TIA/acute ischemic stroke work-up. Consults called: Neurology consult ordered Admission status: Observation/telemetry   Edmonia Lynch MD [facility] Pager [phone]  If 7PM-7AM, please contact night-coverage [email or web address] Password   02/04/2020, 3:33 AM

## 2020-02-04 NOTE — Progress Notes (Addendum)
Patient seen and evaluated, chart reviewed, please see EMR for updated orders. Please see full H&P dictated by admitting physician Dr.Khan for same date of service.    Brief Summary:- 86 y.o. female with medical history significant of HTN, PAFib (declined Full anticoagulation), h/o CVA (07/2013) with very subtle residual left arm weaknes, H/o CAD with prior angioplasty and stent placement (09/2018)   A/p 1)Speech Disturbance / Metabolic Encepahalopathy/TIA ---   PTA patient was confused, could not find the right words, and his speech was garbled...  neuro deficits completely resolved within 3 hours -Remains neurologically at baseline/intact at this time -Echo pending -CT head without acute stroke, CTA neck and CTA head reviewed with on-call neurologist Dr. Lorraine Lax at [place], he recommends carotid artery Dopplers and brain MRI -Which is not available at [facility] until 02/14/2020 -Given history of paroxysmal atrial fibrillation patient probably need for anticoagulation -PTA patient was on aspirin and Plavix for over a year for CAD with stent placed 09/2018, just came off aspirin couple weeks ago but has been taking Plavix faithfully PTA -A1c is 5.6, UDS negative, BAL negative -LDL is 80, HDL is 48 with a total cholesterol of 141 and triglycerides of 63--- Even if her lipid panel is within desired limits, patient should still take Statin for it's Pleiotropic effects (beyond cholesterol lowering benefits)  2) history of prior GI bleed--- in April 2020 patient had GI bleed requiring transfusion in the setting of aspirin, Plavix, and OTC NSAID and additional aspirin use for arthritis -EGD in 04/11/2019 revealed distal esophageal ulcer with active bleeding treated with APC as well as small prepyloric gastric ulcer and hiatal hernia -No further bleeding or transfusion since despite being on aspirin and Plavix concomitantly until 12/2019  3)PAFib--- initially diagnosed in 2017, CHA2DS2- VASc  score   is = at least 7 (age x 2, female, prior CVA x 2, HTN, PAD/Aortic Plaque and CAD, , dCHF  Which is  equal to = 11.2 % annual risk of stroke  --Patient apparently was on Xarelto for about 3 months a few years ago, she did not have any bleeding on Xarelto but she did not like the way Xarelto made her feel so she discontinued it -Given new TIA/aphasia spite adequate antiplatelet therapy and acceptable cholesterol--full anticoagulation with Eliquis should be seriously considered  4)CAD--- h/o  two-vessel CAD with PCI of the RCA and circumflex in 09/2018  Her RCA PCI was quite complicated with extensive atherectomy and essentially ostium to distal PCI---was on Plavix and aspirin through 01/11/2020.  PTA was on Plavix only having stopped aspirin a couple weeks ago -Not on beta-blocker apparently due to intolerance and soft BP -Continue pravastatin and Plavix for now  5)-Status post CVA in 07/2013--- management as above in #1  6)HFpEF--- patient with history of chronic combined systolic  and diastolic heart failure, last known EF 60 to 65% based on echo from 10/10/2018 -Currently appears compensated and is stable -Apparently intolerant to beta-blockers, not requiring diuretics  7)PAD-AAA (abdominal aortic aneurysm)--appears stable-Follow-up with ultrasound in 2 years as recommended   8)-CKD (chronic kidney disease), stage III (HCC) -Serum creatinine 1.26 on admission which is similar to prior creatinine level on her records renally adjust medications, avoid nephrotoxic agents / dehydration  / hypotension . 9)Dementia--- patient has memory and cognitive deficits, as per daughter this is her baseline  Case/chart including CT head without contrast, CTA neck and CTA head reviewed with on-call neurologist Dr. Lorraine Lax at [place], he recommends carotid  artery Dopplers and brain MRI -Which is not available at [facility] until 02/14/2020 -Telemetry neurology consult appreciated  -Transfer  from [facility], ED to [facility] for brain MRI, carotid artery Doppler and follow in-house neurology evaluation -Patient will also need in-house neurology consultation given vascular abnormalities found on CTA head and CTA neck  -Plan of care discussed extensively with patient and patient's daughter, questions answered

## 2020-02-04 NOTE — Progress Notes (Signed)
  Echocardiogram 2D Echocardiogram has been performed.  Leslie Carr 02/04/2020, 8:23 AM

## 2020-02-05 ENCOUNTER — Observation Stay (HOSPITAL_COMMUNITY): Payer: Medicare Other

## 2020-02-05 ENCOUNTER — Encounter (HOSPITAL_COMMUNITY): Payer: Self-pay | Admitting: Internal Medicine

## 2020-02-05 DIAGNOSIS — E854 Organ-limited amyloidosis: Secondary | ICD-10-CM | POA: Diagnosis present

## 2020-02-05 DIAGNOSIS — E782 Mixed hyperlipidemia: Secondary | ICD-10-CM

## 2020-02-05 DIAGNOSIS — Z888 Allergy status to other drugs, medicaments and biological substances status: Secondary | ICD-10-CM | POA: Diagnosis not present

## 2020-02-05 DIAGNOSIS — Z9012 Acquired absence of left breast and nipple: Secondary | ICD-10-CM | POA: Diagnosis not present

## 2020-02-05 DIAGNOSIS — I130 Hypertensive heart and chronic kidney disease with heart failure and stage 1 through stage 4 chronic kidney disease, or unspecified chronic kidney disease: Secondary | ICD-10-CM | POA: Diagnosis present

## 2020-02-05 DIAGNOSIS — E7800 Pure hypercholesterolemia, unspecified: Secondary | ICD-10-CM | POA: Diagnosis present

## 2020-02-05 DIAGNOSIS — G459 Transient cerebral ischemic attack, unspecified: Secondary | ICD-10-CM | POA: Diagnosis not present

## 2020-02-05 DIAGNOSIS — I639 Cerebral infarction, unspecified: Secondary | ICD-10-CM | POA: Diagnosis not present

## 2020-02-05 DIAGNOSIS — R4789 Other speech disturbances: Secondary | ICD-10-CM | POA: Diagnosis not present

## 2020-02-05 DIAGNOSIS — Z955 Presence of coronary angioplasty implant and graft: Secondary | ICD-10-CM | POA: Diagnosis not present

## 2020-02-05 DIAGNOSIS — I77810 Thoracic aortic ectasia: Secondary | ICD-10-CM | POA: Diagnosis present

## 2020-02-05 DIAGNOSIS — Z20822 Contact with and (suspected) exposure to covid-19: Secondary | ICD-10-CM | POA: Diagnosis present

## 2020-02-05 DIAGNOSIS — G9341 Metabolic encephalopathy: Secondary | ICD-10-CM | POA: Diagnosis present

## 2020-02-05 DIAGNOSIS — I2510 Atherosclerotic heart disease of native coronary artery without angina pectoris: Secondary | ICD-10-CM | POA: Diagnosis present

## 2020-02-05 DIAGNOSIS — J99 Respiratory disorders in diseases classified elsewhere: Secondary | ICD-10-CM | POA: Diagnosis present

## 2020-02-05 DIAGNOSIS — R4701 Aphasia: Secondary | ICD-10-CM | POA: Diagnosis present

## 2020-02-05 DIAGNOSIS — R29702 NIHSS score 2: Secondary | ICD-10-CM | POA: Diagnosis not present

## 2020-02-05 DIAGNOSIS — Z8719 Personal history of other diseases of the digestive system: Secondary | ICD-10-CM | POA: Diagnosis not present

## 2020-02-05 DIAGNOSIS — I82B12 Acute embolism and thrombosis of left subclavian vein: Secondary | ICD-10-CM | POA: Diagnosis present

## 2020-02-05 DIAGNOSIS — I480 Paroxysmal atrial fibrillation: Secondary | ICD-10-CM

## 2020-02-05 DIAGNOSIS — Z87891 Personal history of nicotine dependence: Secondary | ICD-10-CM | POA: Diagnosis not present

## 2020-02-05 DIAGNOSIS — E785 Hyperlipidemia, unspecified: Secondary | ICD-10-CM | POA: Diagnosis present

## 2020-02-05 DIAGNOSIS — Z853 Personal history of malignant neoplasm of breast: Secondary | ICD-10-CM | POA: Diagnosis not present

## 2020-02-05 DIAGNOSIS — I5042 Chronic combined systolic (congestive) and diastolic (congestive) heart failure: Secondary | ICD-10-CM | POA: Diagnosis present

## 2020-02-05 DIAGNOSIS — R410 Disorientation, unspecified: Secondary | ICD-10-CM | POA: Diagnosis not present

## 2020-02-05 DIAGNOSIS — N183 Chronic kidney disease, stage 3 unspecified: Secondary | ICD-10-CM | POA: Diagnosis present

## 2020-02-05 DIAGNOSIS — I739 Peripheral vascular disease, unspecified: Secondary | ICD-10-CM | POA: Diagnosis present

## 2020-02-05 MED ORDER — ASPIRIN EC 81 MG PO TBEC
81.0000 mg | DELAYED_RELEASE_TABLET | Freq: Every day | ORAL | Status: DC
  Administered 2020-02-06: 09:00:00 81 mg via ORAL
  Filled 2020-02-05: qty 1

## 2020-02-05 MED ORDER — CLOPIDOGREL BISULFATE 75 MG PO TABS
75.0000 mg | ORAL_TABLET | Freq: Every day | ORAL | Status: DC
  Administered 2020-02-05: 16:00:00 75 mg via ORAL
  Filled 2020-02-05: qty 1

## 2020-02-05 MED ORDER — APIXABAN 5 MG PO TABS
5.0000 mg | ORAL_TABLET | Freq: Two times a day (BID) | ORAL | Status: DC
  Administered 2020-02-05 – 2020-02-06 (×3): 5 mg via ORAL
  Filled 2020-02-05 (×3): qty 1

## 2020-02-05 NOTE — Evaluation (Signed)
Occupational Therapy Evaluation Patient Details Name: Leslie Carr MRN: [id number] DOB: [birth year] Today's Date: 02/05/2020    History of Present Illness  86 y.o. female with PMHx including breast cancer, CAD, hypercholesterolemia, prior stroke with mild residual left hand weakness, presented to [facility] ED with c/o confusion and difficulty with word finding. CT brain with no acute changes, CT head and neck with Atherosclerotic disease at both carotid bifurcations. MRI terminated early by pt but noted grossly without changes compared to CT imaging performed.    Clinical Impression   This 86 y/o female presents with the above. PTA pt reports independence with household functional mobility and ADL tasks, lives with daughter and son-in-law. Pt currently completing room level mobility without AD at minguard-supervision level; overall demonstrating ADL tasks with supervision throughout. Pt with noted STM deficits during session, but suspect this may likely be close to her baseline. She will benefit from continued acute OT services to maximize her overall safety and independence with ADL and mobility prior to discharge, do not anticipate pt will require follow up OT services.     Follow Up Recommendations  No OT follow up;Supervision - Intermittent    Equipment Recommendations  Tub/shower seat           Precautions / Restrictions Precautions Precautions: Fall Restrictions Weight Bearing Restrictions: No      Mobility Bed Mobility               General bed mobility comments: pt received OOB in recliner  Transfers Overall transfer level: Needs assistance Equipment used: None Transfers: Sit to/from Stand Sit to Stand: Supervision         General transfer comment: for safety and balance, no physical assist required, stood from recliner x2 and toilet x1     Balance Overall balance assessment: Mild deficits observed, not formally tested                                          ADL either performed or assessed with clinical judgement   ADL Overall ADL's : Needs assistance/impaired Eating/Feeding: Modified independent;Sitting   Grooming: Wash/dry hands;Wash/dry face;Oral care;Supervision/safety;Standing   Upper Body Bathing: Supervision/ safety;Sitting   Lower Body Bathing: Supervison/ safety;Sit to/from stand   Upper Body Dressing : Supervision/safety;Sitting   Lower Body Dressing: Supervision/safety;Sit to/from stand   Toilet Transfer: Supervision/safety;Min guard;Ambulation   Toileting- Clothing Manipulation and Hygiene: Supervision/safety;Sit to/from stand;Sitting/lateral lean Toileting - Clothing Manipulation Details (indicate cue type and reason): pt performing pericare and clothing management (gown and pants) without assist     Functional mobility during ADLs: Min guard;Supervision/safety                           Pertinent Vitals/Pain Pain Assessment: No/denies pain     Hand Dominance Right   Extremity/Trunk Assessment Upper Extremity Assessment Upper Extremity Assessment: Overall WFL for tasks assessed   Lower Extremity Assessment Lower Extremity Assessment: Defer to PT evaluation   Cervical / Trunk Assessment Cervical / Trunk Assessment: Kyphotic   Communication Communication Communication: No difficulties   Cognition Arousal/Alertness: Awake/alert Behavior During Therapy: WFL for tasks assessed/performed Overall Cognitive Status: History of cognitive impairments - at baseline  General Comments: pt with hx of dementia, noted STM deficits as pt repeating some statements/stories to therapist   General Comments       Exercises     Shoulder Instructions      Home Living Family/patient expects to be discharged to:: Private residence Living Arrangements: Children(with daughter and son in law) Available Help at Discharge: Family;Available 24  hours/day Type of Home: House Home Access: Level entry   Entrance Stairs-Rails: Left Home Layout: Two level Alternate Level Stairs-Number of Steps: 1 flight Alternate Level Stairs-Rails: Right Bathroom Shower/Tub: Occupational psychologist: Standard Bathroom Accessibility: Yes   Home Equipment: Walker - 4 wheels;Shower seat;Cane - single point          Prior Functioning/Environment Level of Independence: Independent with assistive device(s)        Comments: Independent for household distances and uses rollator for community distances.        OT Problem List: Decreased strength;Decreased activity tolerance;Impaired balance (sitting and/or standing);Decreased cognition      OT Treatment/Interventions: Self-care/ADL training;Therapeutic exercise;Energy conservation;DME and/or AE instruction;Therapeutic activities;Patient/family education;Balance training;Cognitive remediation/compensation    OT Goals(Current goals can be found in the care plan section) Acute Rehab OT Goals Patient Stated Goal: "to take a real shower" OT Goal Formulation: With patient Time For Goal Achievement: 02/19/20 Potential to Achieve Goals: Good  OT Frequency: Min 2X/week   Barriers to D/C:            Co-evaluation              AM-PAC OT "6 Clicks" Daily Activity     Outcome Measure Help from another person eating meals?: None Help from another person taking care of personal grooming?: None Help from another person toileting, which includes using toliet, bedpan, or urinal?: None Help from another person bathing (including washing, rinsing, drying)?: A Little Help from another person to put on and taking off regular upper body clothing?: None Help from another person to put on and taking off regular lower body clothing?: A Little 6 Click Score: 22   End of Session Nurse Communication: Mobility status  Activity Tolerance: Patient tolerated treatment well Patient left: in  chair;with call bell/phone within reach;with chair alarm set  OT Visit Diagnosis: Unsteadiness on feet (R26.81);Other symptoms and signs involving cognitive function;Other symptoms and signs involving the nervous system (R29.898)                Time: [id number] OT Time Calculation (min): 22 min Charges:  OT General Charges $OT Visit: 1 Visit OT Evaluation $OT Eval Moderate Complexity: [place], OT [employer] Pager [phone] Office [phone]   Raymondo Band 02/05/2020, 10:06 AM

## 2020-02-05 NOTE — Evaluation (Signed)
Physical Therapy Evaluation Patient Details Name: Leslie Carr MRN: [id number] DOB: March 17, 1931 Today's Date: 02/05/2020   History of Present Illness  86 y.o. female with PMHx including breast cancer, CAD, hypercholesterolemia, prior stroke with mild residual left hand weakness, presented to [facility] ED with c/o confusion and difficulty with word finding. CT brain with no acute changes, CT head and neck with Atherosclerotic disease at both carotid bifurcations. MRI terminated early by pt but noted grossly without changes compared to CT imaging performed. Admitted for stroke/TIA versus vertebrobasilar insufficiency.  Clinical Impression  Patient presents with impaired balance, baseline cognitive deficits, and impaired mobility s/p above. Pt reports being a furniture walker at home and uses rollator for community ambulation and lives with her daughter and son in [place]. Today, pt requires Min guard for transfers and gait training due to shuffling like gait with decreased foot clearance. Pt tangential with short term memory deficits during session with poor awareness of situation, likely baseline. Will follow acutely to maximize independence and mobility prior to return home.    Follow Up Recommendations No PT follow up;Supervision - Intermittent    Equipment Recommendations  None recommended by PT    Recommendations for Other Services       Precautions / Restrictions Precautions Precautions: Fall Restrictions Weight Bearing Restrictions: No      Mobility  Bed Mobility               General bed mobility comments: Standing in bathroom with tech upon PT arrival.  Transfers Overall transfer level: Needs assistance Equipment used: None Transfers: Sit to/from Stand Sit to Stand: Supervision         General transfer comment: Supervision for safety. Stood from [employer], transferred to chair post ambulation.  Ambulation/Gait Ambulation/Gait assistance: Min guard Gait Distance  (Feet): 150 Feet Assistive device: None Gait Pattern/deviations: Shuffle;Step-through pattern;Narrow base of support Gait velocity: decreased Gait velocity interpretation: <1.31 ft/sec, indicative of household ambulator General Gait Details: Slow, shuffling like gait pattern holding onto rails at times for support; decreased foot clearance bilaterally.  Stairs            Wheelchair Mobility    Modified Rankin (Stroke Patients Only) Modified Rankin (Stroke Patients Only) Pre-Morbid Rankin Score: Moderate disability Modified Rankin: Moderate disability     Balance Overall balance assessment: Needs assistance Sitting-balance support: Feet supported;No upper extremity supported Sitting balance-Leahy Scale: Good Sitting balance - Comments: supervision for safety.   Standing balance support: During functional activity Standing balance-Leahy Scale: Fair Standing balance comment: Able to stand at sink and wash hands with Min guard for safety, reaches for rail at times during walking.                             Pertinent Vitals/Pain Pain Assessment: No/denies pain    Home Living Family/patient expects to be discharged to:: Private residence Living Arrangements: Children(daughter and son in law) Available Help at Discharge: Family;Available 24 hours/day Type of Home: House Home Access: Level entry Entrance Stairs-Rails: Left   Home Layout: Two level Home Equipment: Walker - 4 wheels;Shower seat;Cane - single point      Prior Function Level of Independence: Independent with assistive device(s)         Comments: Independent for household distances and uses rollator for community distances. pt reports she likes to walk the neighborhood with her neighbors. "I dont like to go up/down the stairs but i make myself do it."  Hand Dominance   Dominant Hand: Right    Extremity/Trunk Assessment   Upper Extremity Assessment Upper Extremity Assessment: Defer  to OT evaluation    Lower Extremity Assessment Lower Extremity Assessment: Generalized weakness(but functional)    Cervical / Trunk Assessment Cervical / Trunk Assessment: Kyphotic  Communication   Communication: No difficulties  Cognition Arousal/Alertness: Awake/alert Behavior During Therapy: Anxious Overall Cognitive Status: History of cognitive impairments - at baseline                                 General Comments: per chart with hx of dementia, noted STM deficits as pt often repeating statements/stories to therapist. "they about tried to kill me yesterday, putting me in that thing, it was like the twilight zone." poor awareness of situation and why she is in thehospital. Perseverating on "I am fine, i am fine," despite later stating she just does not feel right.      General Comments      Exercises     Assessment/Plan    PT Assessment Patient needs continued PT services  PT Problem List Decreased mobility;Decreased safety awareness;Decreased balance;Decreased cognition       PT Treatment Interventions Therapeutic activities;Gait training;Therapeutic exercise;Patient/family education;Balance training;Stair training;Functional mobility training;Neuromuscular re-education;DME instruction    PT Goals (Current goals can be found in the Care Plan section)  Acute Rehab PT Goals Patient Stated Goal: to take a shower and go home PT Goal Formulation: With patient Time For Goal Achievement: 02/19/20 Potential to Achieve Goals: Good    Frequency Min 3X/week   Barriers to discharge Inaccessible home environment stairs to get to bedroom    Co-evaluation               AM-PAC PT "6 Clicks" Mobility  Outcome Measure Help needed turning from your back to your side while in a flat bed without using bedrails?: None Help needed moving from lying on your back to sitting on the side of a flat bed without using bedrails?: A Little Help needed moving to and  from a bed to a chair (including a wheelchair)?: A Little Help needed standing up from a chair using your arms (e.g., wheelchair or bedside chair)?: A Little Help needed to walk in hospital room?: A Little Help needed climbing 3-5 steps with a railing? : A Little 6 Click Score: 19    End of Session Equipment Utilized During Treatment: Gait belt Activity Tolerance: Patient tolerated treatment well Patient left: in chair;with call bell/phone within reach;with chair alarm set Nurse Communication: Mobility status PT Visit Diagnosis: Difficulty in walking, not elsewhere classified (R26.2);Unsteadiness on feet (R26.81)    Time: [id number] PT Time Calculation (min) (ACUTE ONLY): 18 min   Charges:   PT Evaluation $PT Eval Moderate Complexity: 1 Mod          Marisa Severin, PT, DPT Acute Rehabilitation Services Pager [phone] Office [phone]      Leslie Carr 02/05/2020, 11:12 AM

## 2020-02-05 NOTE — Progress Notes (Signed)
Progress Note    Leslie Carr  [id number] DOB: 1931-03-07  DOA: 02/03/2020 PCP: [name]    Brief Narrative:     Medical records reviewed and are as summarized below:  Leslie Carr is an 86 y.o. female with medical history significant of HTN, PAFib (declined Full anticoagulation), h/o CVA (07/2013) with very subtle residual left arm weaknes, H/o CAD with prior angioplasty and stent placement (09/2018)  Assessment/Plan:   Principal Problem:   TIA (transient ischemic attack) Active Problems:   Hyperlipidemia   CVA (cerebral vascular accident) ([place])   PAF (paroxysmal atrial fibrillation) ([place]); CHA2DS2-VASc Score = 7.  Patient declines DOAC   Coronary artery disease involving native heart without angina pectoris   Gastrointestinal hemorrhage   Acute metabolic encephalopathy   1)Speech Disturbance / Metabolic Encepahalopathy/TIA ---   PTA patient was confused, could not find the right words, and his speech was garbled...  neuro deficits completely resolved within 3 hours -Remains neurologically at baseline/intact at this time -Echo with preserved EF -Patient unable to tolerate full MRI due to claustrophobia -PTA patient was on aspirin and Plavix for over a year for CAD with stent placed 09/2018, just came off aspirin couple weeks ago but has been taking Plavix faithfully PTA -A1c is 5.6, UDS negative -LDL is 80 Neurology consult appreciated: Await their recommendations  2) history of prior GI bleed--- in April 2020 patient had GI bleed requiring transfusion in the setting of aspirin, Plavix, and OTC NSAID and additional aspirin use for arthritis -EGD in 04/11/2019 revealed distal esophageal ulcer with active bleeding treated with APC as well as small prepyloric gastric ulcer and hiatal hernia -No further bleeding or transfusion since despite being on aspirin and Plavix concomitantly until 12/2019  3)PAFib--- initially diagnosed in 2017,  CHA2DS2- VASc score   is = at least 7 (age x 2, female, prior CVA x 2, HTN, PAD/Aortic Plaque and CAD, , dCHF  Which is  equal to = 11.2 % annual risk of stroke  --Patient apparently was on Xarelto for about 3 months a few years ago, she did not have any bleeding on Xarelto but she did not like the way Xarelto made her feel so she discontinued it-patient also had a GI bleed in April 2020 so she may not be eligible for full anticoagulation  4)CAD--- h/o  two-vessel CAD with PCI of the RCA and circumflex in 09/2018 Her RCA PCI was quite complicated with extensive atherectomy and essentially ostium to distal PCI---was on Plavix and aspirin through 01/11/2020.  PTA was on Plavix only having stopped aspirin a couple weeks ago -Not on beta-blocker apparently due to intolerance and soft BP -Continue pravastatin and Plavix for now  5)-Status post CVA in 07/2013--- management as above in #1  6)HFpEF--- patient with history of chronic combined systolic  and diastolic heart failure, last known EF 60 to 65% based on echo from 10/10/2018 -Currently appears compensated and is stable -Apparently intolerant to beta-blockers, not requiring diuretics  7)PAD-AAA (abdominal aortic aneurysm)--appears stable-Follow-up with ultrasound in 2 years as recommended  8)-CKD (chronic kidney disease), stage III (HCC) -Serum creatinine 1.26 on admission which is similar to prior creatinine level on her records renally adjust medications, avoid nephrotoxic agents / dehydration  / hypotension . 9)Dementia--- patient has memory and cognitive deficits, appears to be at her baseline   Family Communication/Anticipated D/C date and plan/Code Status    Disposition Plan: Home once work-up complete with neurology signing off  Medical Consultants:    Neurology     Subjective:   [name] the MRI made her feel like she was in twilight zone and she will never have another one again  Objective:    Vitals:   02/04/20  2351 02/05/20 0403 02/05/20 0919 02/05/20 1220  BP: (!) 190/108 125/83 120/67 126/73  Pulse: (!) 103 88 (!) 102 73  Resp:  18 16 18   Temp: 98.2 F (36.8 C) 98.1 F (36.7 C) 98.4 F (36.9 C) 98.1 F (36.7 C)  TempSrc: Oral Oral Oral Oral  SpO2: 97% 99% 98% 97%  Weight:      Height:        Intake/Output Summary (Last 24 hours) at 02/05/2020 1350 Last data filed at 02/05/2020 [id number] Gross per 24 hour  Intake 30 ml  Output --  Net 30 ml   Filed Weights   02/03/20 2211  Weight: 64 kg    Exam: In chair, no acute distress Fixated on MRI and feeling like she is in the twilight zone Regular rate and rhythm No increased work of breathing Moves all 4 extremities equally, speech fluent  Data Reviewed:   I have personally reviewed following labs and imaging studies:  Labs: Labs show the following:   Basic Metabolic Panel: Recent Labs  Lab 02/03/20 2234  NA 140  K 3.9  CL 104  CO2 24  GLUCOSE 115*  BUN 24*  CREATININE 1.26*  CALCIUM 8.7*   GFR Estimated Creatinine Clearance: 28.8 mL/min (A) (by C-G formula based on SCr of 1.26 mg/dL (H)). Liver Function Tests: Recent Labs  Lab 02/03/20 2234  AST 17  ALT 15  ALKPHOS 62  BILITOT 0.6  PROT 6.9  ALBUMIN 3.8   No results for input(s): LIPASE, AMYLASE in the last 168 hours. No results for input(s): AMMONIA in the last 168 hours. Coagulation profile Recent Labs  Lab 02/03/20 2234  INR 1.0    CBC: Recent Labs  Lab 02/03/20 2234  WBC 6.8  NEUTROABS 5.5  HGB 13.3  HCT 42.4  MCV 94.9  PLT 269   Cardiac Enzymes: No results for input(s): CKTOTAL, CKMB, CKMBINDEX, TROPONINI in the last 168 hours. BNP (last 3 results) No results for input(s): PROBNP in the last 8760 hours. CBG: No results for input(s): GLUCAP in the last 168 hours. D-Dimer: No results for input(s): DDIMER in the last 72 hours. Hgb A1c: Recent Labs    02/04/20 0633  HGBA1C 5.6   Lipid Profile: Recent Labs    02/04/20 0633  CHOL  141  HDL 48  LDLCALC 80  TRIG 63  CHOLHDL 2.9   Thyroid function studies: No results for input(s): TSH, T4TOTAL, T3FREE, THYROIDAB in the last 72 hours.  Invalid input(s): FREET3 Anemia work up: No results for input(s): VITAMINB12, FOLATE, FERRITIN, TIBC, IRON, RETICCTPCT in the last 72 hours. Sepsis Labs: Recent Labs  Lab 02/03/20 2234  WBC 6.8    Microbiology Recent Results (from the past 240 hour(s))  Respiratory Panel by RT PCR (Flu A&B, Covid) - Nasopharyngeal Swab     Status: None   Collection Time: 02/03/20 11:26 PM   Specimen: Nasopharyngeal Swab  Result Value Ref Range Status   SARS Coronavirus 2 by RT PCR NEGATIVE NEGATIVE Final    Comment: (NOTE) SARS-CoV-2 target nucleic acids are NOT DETECTED. The SARS-CoV-2 RNA is generally detectable in upper respiratoy specimens during the acute phase of infection. The lowest concentration of SARS-CoV-2 viral copies this assay can detect is 131 copies/mL.  A negative result does not preclude SARS-Cov-2 infection and should not be used as the sole basis for treatment or other patient management decisions. A negative result may occur with  improper specimen collection/handling, submission of specimen other than nasopharyngeal swab, presence of viral mutation(s) within the areas targeted by this assay, and inadequate number of viral copies (<131 copies/mL). A negative result must be combined with clinical observations, patient history, and epidemiological information. The expected result is Negative. Fact Sheet for Patients:  PinkCheek.be Fact Sheet for Healthcare Providers:  GravelBags.it This test is not yet ap proved or cleared by the [place] FDA and  has been authorized for detection and/or diagnosis of SARS-CoV-2 by FDA under an Emergency Use Authorization (EUA). This EUA will remain  in effect (meaning this test can be used) for the duration of  the COVID-19 declaration under Section 564(b)(1) of the Act, 21 U.S.C. section 360bbb-3(b)(1), unless the authorization is terminated or revoked sooner.    Influenza A by PCR NEGATIVE NEGATIVE Final   Influenza B by PCR NEGATIVE NEGATIVE Final    Comment: (NOTE) The Xpert Xpress SARS-CoV-2/FLU/RSV assay is intended as an aid in  the diagnosis of influenza from Nasopharyngeal swab specimens and  should not be used as a sole basis for treatment. Nasal washings and  aspirates are unacceptable for Xpert Xpress SARS-CoV-2/FLU/RSV  testing. Fact Sheet for Patients: PinkCheek.be Fact Sheet for Healthcare Providers: GravelBags.it This test is not yet approved or cleared by the [place] FDA and  has been authorized for detection and/or diagnosis of SARS-CoV-2 by  FDA under an Emergency Use Authorization (EUA). This EUA will remain  in effect (meaning this test can be used) for the duration of the  Covid-19 declaration under Section 564(b)(1) of the Act, 21  U.S.C. section 360bbb-3(b)(1), unless the authorization is  terminated or revoked. Performed at [facility], [address], [place], [place] [address]     Procedures and diagnostic studies:  CT Angio Head W or Wo Contrast  Result Date: 02/04/2020 CLINICAL DATA:  Acute onset of confusion and disorientation. No acute finding by head CT. EXAM: CT ANGIOGRAPHY HEAD AND NECK TECHNIQUE: Multidetector CT imaging of the head and neck was performed using the standard protocol during bolus administration of intravenous contrast. Multiplanar CT image reconstructions and MIPs were obtained to evaluate the vascular anatomy. Carotid stenosis measurements (when applicable) are obtained utilizing NASCET criteria, using the distal internal carotid diameter as the denominator. CONTRAST:  42mL OMNIPAQUE IOHEXOL 350 MG/ML SOLN COMPARISON:  Head CT 02/03/2020 FINDINGS: CTA NECK FINDINGS Aortic arch:  Pronounced aortic atherosclerosis with irregular plaque. Ascending aortic diameter as large as 3.7 cm in the region visualized. No origin stenosis of the innominate or left common carotid artery. 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 Right carotid system: Common carotid artery is patent till the bifurcation region. Extensive soft and calcified plaque just proximal to the bifurcation with luminal diameter as narrow as 2.3 mm. At the bifurcation, there is calcified plaque extending into the ICA bulb with minimal diameter of the distal bulb measuring 1.9 mm. More distal cervical ICA is widely patent with a diameter of 5 mm. Therefore, there is 60% stenosis. Left carotid system: Common carotid artery shows some plaque but is widely patent to the bifurcation region. Calcified plaque at the carotid bifurcation and throughout the ICA bulb. Minimal diameter of the distal bulb measures 2 mm. Compared to a more  distal ICA diameter of 4 mm, this indicates a 50% stenosis. Cervical ICA is widely patent beyond that. Vertebral arteries: Moderate stenosis of the proximal right subclavian artery, estimated at 30%. Calcified plaque affecting the right vertebral artery origin with stenosis of 70%. This dominant vessel is widely patent beyond that to the foramen magnum. As noted above, there is long segment stenosis of the proximal left subclavian artery due to circumferential plaque, with luminal diameter of only 2.5 mm. Left subclavian artery is completely occluded at about the expected level of the left vertebral artery origin. The left vertebral artery origin is occluded. There is some reconstituted flow in the mid to distal vertebral artery secondary 2 cervical collaterals. Vessel does show flow at the foramen magnum. Skeleton: Ordinary cervical spondylosis. Other neck: No soft tissue mass or lymphadenopathy. Dense calcification within the  left thyroid lobe likely benign. Upper chest: Pleural and parenchymal scarring at the lung apices left worse than right. Review of the MIP images confirms the above findings CTA HEAD FINDINGS Anterior circulation: Both internal carotid arteries are patent through the skull base and siphon regions. There is ordinary siphon atherosclerotic calcification with stenosis estimated at 30%. The anterior and middle cerebral vessels are patent. No large or medium vessel occlusion. No correctable proximal intracranial vessel stenosis. Posterior circulation: Dominant right vertebral artery is widely patent to the basilar. Non dominant reconstituted left vertebral artery shows 70% stenosis in the V4 segment but does give some supply to the basilar. Dominant PICA arises from the left vertebral artery. No basilar stenosis. Superior cerebellar and posterior cerebral arteries show flow. There is some atherosclerotic narrowing and irregularity of the PCA branches. Venous sinuses: Patent and normal. Anatomic variants: None other significant. Review of the MIP images confirms the above findings IMPRESSION: No intracranial large or medium vessel occlusion. Advanced irregular atherosclerotic disease of the aorta. Dilatation of the ascending aorta with diameter as large as 3.7 cm in the region studied. Atherosclerotic disease at both carotid bifurcations and ICA bulb regions. 50% stenosis of the left ICA bulb. 60% stenosis of the right carotid bifurcation and ICA bulb. 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 Circumferential mural thrombus affecting the proximal 3.5 cm of the vessel, with patent lumen through that region of only 2.5 mm. Complete occlusion of the subclavian artery just beyond that. 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 Reconstitution in the mid upper cervical region by cervical collaterals, with subsequent antegrade flow beyond that. 70% stenosis of the left V4 segment. 30% stenosis of the  proximal right subclavian artery. 70% stenosis of the right vertebral artery origin but wide patency beyond that to the basilar. Electronically Signed   By: Nelson Chimes M.D.   On: 02/04/2020 00:57   DG Abd 1 View  Result Date: 02/04/2020 CLINICAL DATA:  Assess for presence of retained hardware EXAM: ABDOMEN - 1 VIEW COMPARISON:  CT abdomen pelvis 04/19/2019 FINDINGS: No unexpected radiopaque or metallic foreign bodies are identified. Telemetry leads overlie the abdomen and chest. Normal bowel gas pattern. Extensive vascular calcium is noted. Degenerative changes present in the spine and pelvis. Lung bases are clear. IMPRESSION: 1. No unexpected radiopaque or metallic foreign bodies are identified in the abdomen or pelvis. 2. Extensive vascular calcium. 3. Normal bowel gas pattern. Electronically Signed   By: Lovena Le M.D.   On: 02/04/2020 22:34   CT Angio Neck W and/or Wo Contrast  Result Date: 02/04/2020 CLINICAL DATA:  Acute onset of confusion and disorientation. No acute  finding by head CT. EXAM: CT ANGIOGRAPHY HEAD AND NECK TECHNIQUE: Multidetector CT imaging of the head and neck was performed using the standard protocol during bolus administration of intravenous contrast. Multiplanar CT image reconstructions and MIPs were obtained to evaluate the vascular anatomy. Carotid stenosis measurements (when applicable) are obtained utilizing NASCET criteria, using the distal internal carotid diameter as the denominator. CONTRAST:  47mL OMNIPAQUE IOHEXOL 350 MG/ML SOLN COMPARISON:  Head CT 02/03/2020 FINDINGS: CTA NECK FINDINGS Aortic arch: Pronounced aortic atherosclerosis with irregular plaque. Ascending aortic diameter as large as 3.7 cm in the region visualized. No origin stenosis of the innominate or left common carotid artery. 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 Right carotid system: Common carotid artery is  patent till the bifurcation region. Extensive soft and calcified plaque just proximal to the bifurcation with luminal diameter as narrow as 2.3 mm. At the bifurcation, there is calcified plaque extending into the ICA bulb with minimal diameter of the distal bulb measuring 1.9 mm. More distal cervical ICA is widely patent with a diameter of 5 mm. Therefore, there is 60% stenosis. Left carotid system: Common carotid artery shows some plaque but is widely patent to the bifurcation region. Calcified plaque at the carotid bifurcation and throughout the ICA bulb. Minimal diameter of the distal bulb measures 2 mm. Compared to a more distal ICA diameter of 4 mm, this indicates a 50% stenosis. Cervical ICA is widely patent beyond that. Vertebral arteries: Moderate stenosis of the proximal right subclavian artery, estimated at 30%. Calcified plaque affecting the right vertebral artery origin with stenosis of 70%. This dominant vessel is widely patent beyond that to the foramen magnum. As noted above, there is long segment stenosis of the proximal left subclavian artery due to circumferential plaque, with luminal diameter of only 2.5 mm. Left subclavian artery is completely occluded at about the expected level of the left vertebral artery origin. The left vertebral artery origin is occluded. There is some reconstituted flow in the mid to distal vertebral artery secondary 2 cervical collaterals. Vessel does show flow at the foramen magnum. Skeleton: Ordinary cervical spondylosis. Other neck: No soft tissue mass or lymphadenopathy. Dense calcification within the left thyroid lobe likely benign. Upper chest: Pleural and parenchymal scarring at the lung apices left worse than right. Review of the MIP images confirms the above findings CTA HEAD FINDINGS Anterior circulation: Both internal carotid arteries are patent through the skull base and siphon regions. There is ordinary siphon atherosclerotic calcification with stenosis  estimated at 30%. The anterior and middle cerebral vessels are patent. No large or medium vessel occlusion. No correctable proximal intracranial vessel stenosis. Posterior circulation: Dominant right vertebral artery is widely patent to the basilar. Non dominant reconstituted left vertebral artery shows 70% stenosis in the V4 segment but does give some supply to the basilar. Dominant PICA arises from the left vertebral artery. No basilar stenosis. Superior cerebellar and posterior cerebral arteries show flow. There is some atherosclerotic narrowing and irregularity of the PCA branches. Venous sinuses: Patent and normal. Anatomic variants: None other significant. Review of the MIP images confirms the above findings IMPRESSION: No intracranial large or medium vessel occlusion. Advanced irregular atherosclerotic disease of the aorta. Dilatation of the ascending aorta with diameter as large as 3.7 cm in the region studied. Atherosclerotic disease at both carotid bifurcations and ICA bulb regions. 50% stenosis of the left ICA bulb. 60% stenosis of the right carotid bifurcation  and ICA bulb. 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 Circumferential mural thrombus affecting the proximal 3.5 cm of the vessel, with patent lumen through that region of only 2.5 mm. Complete occlusion of the subclavian artery just beyond that. 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 Reconstitution in the mid upper cervical region by cervical collaterals, with subsequent antegrade flow beyond that. 70% stenosis of the left V4 segment. 30% stenosis of the proximal right subclavian artery. 70% stenosis of the right vertebral artery origin but wide patency beyond that to the basilar. Electronically Signed   By: Nelson Chimes M.D.   On: 02/04/2020 00:57   MR BRAIN WO CONTRAST  Result Date: 02/05/2020 CLINICAL DATA:  Acute presentation with speech disturbance. Patient became confused and combative after T1 imaging only  was obtained. EXAM: MRI HEAD WITHOUT CONTRAST TECHNIQUE: Multiplanar, multiecho pulse sequences of the brain and surrounding structures were obtained without intravenous contrast. COMPARISON:  CT studies 02/04/2020 FINDINGS: Brain: Limited information only. No gross morphologic change. No change in ventricular size. No sign of intracranial hematoma or extra-axial collection. IMPRESSION: Patient terminated the study after T1 scout imaging was performed. Therefore, there is very limited information. There is no gross morphologic change when compared to the CT studies of yesterday. Electronically Signed   By: Nelson Chimes M.D.   On: 02/05/2020 02:10   DG CHEST PORT 1 VIEW  Result Date: 02/04/2020 CLINICAL DATA:  Presence of retained hardware, metabolic encephalopathy EXAM: PORTABLE CHEST 1 VIEW COMPARISON:  04/19/2019, 09/19/2018, 11/22/2019 FINDINGS: Single frontal view of the chest demonstrates a stable cardiac silhouette. There is extensive thoracic aortic atherosclerosis and ectasia unchanged since prior exam. The partially calcified right middle lobe nodule seen previously is unchanged. There is chronic left pleural thickening. No airspace disease, effusion, or pneumothorax. Severe left shoulder arthroplasty again noted. IMPRESSION: 1. No acute airspace disease. 2. Stable right middle lobe pulmonary mass, reportedly biopsy-proven amyloidosis. 3. Stable thoracic aortic ectasia and atherosclerosis. Electronically Signed   By: Randa Ngo M.D.   On: 02/04/2020 22:33   ECHOCARDIOGRAM COMPLETE  Result Date: 02/04/2020    ECHOCARDIOGRAM REPORT   Patient Name:   Leslie Carr [facility] Date of Exam: 02/04/2020 Medical Rec #:  [id number]        Height:       63.0 in Accession #:    [id number]       Weight:       141.1 lb Date of Birth:  1931-12-18        BSA:          1.67 m Patient Age:    65 years         BP:           180/81 mmHg Patient Gender: F                HR:           78 bpm. Exam Location:  [facility]  Procedure: 2D Echo, Cardiac Doppler and Color Doppler Indications:    TIA  History:        Patient has prior history of Echocardiogram examinations, most                 recent 09/21/2018. CAD, Stroke, Aortic Valve Disease,                 Arrythmias:Atrial Fibrillation; Risk Factors:Hypertension and                 Dyslipidemia.  Breast cancer.  Sonographer:    Dustin Flock RDCS Referring Phys: [id number] [place]  1. Left ventricular ejection fraction, by estimation, is 60 to 65%. The left ventricle has normal function. The left ventricle has no regional wall motion abnormalities. There is concentric left ventricular hypertrophy. Left ventricular diastolic parameters are consistent with Grade I diastolic dysfunction (impaired relaxation).  2. Right ventricular systolic function is normal. The right ventricular size is normal. Tricuspid regurgitation signal is inadequate for assessing PA pressure.  3. Left atrial size was mildly dilated.  4. The mitral valve is degenerative. Mild mitral valve regurgitation.  5. Mild to moderate aortic stenosis is present. The aortic valve is severely calcified with restricted leaflet motion. SV = 46 cc. SVi = 28 cc/m2. The mean gradient is 10 mmHG, but this is likely underestimated due to the low SVi. The aortic valve has an indeterminant number of cusps. Aortic valve regurgitation is trivial . Aortic valve mean gradient measures 10.0 mmHg. Aortic valve Vmax measures 2.10 m/s.  6. The inferior vena cava is normal in size with greater than 50% respiratory variability, suggesting right atrial pressure of 3 mmHg. Comparison(s): A prior study was performed on 09/21/2018. No significant change from prior study. FINDINGS  Left Ventricle: Left ventricular ejection fraction, by estimation, is 60 to 65%. The left ventricle has normal function. The left ventricle has no regional wall motion abnormalities. There is borderline concentric left ventricular hypertrophy. Left  ventricular diastolic parameters are consistent with Grade I diastolic dysfunction (impaired relaxation). Right Ventricle: The right ventricular size is normal. No increase in right ventricular wall thickness. Right ventricular systolic function is normal. Tricuspid regurgitation signal is inadequate for assessing PA pressure. Left Atrium: Left atrial size was mildly dilated. Right Atrium: Right atrial size was normal in size. Pericardium: Trivial pericardial effusion is present. Presence of pericardial fat pad. Mitral Valve: The mitral valve is degenerative in appearance. Mild to moderate mitral annular calcification. Mild mitral valve regurgitation. Tricuspid Valve: The tricuspid valve is grossly normal. Tricuspid valve regurgitation is trivial. Aortic Valve: Mild to moderate aortic stenosis is present. The aortic valve is severely calcified with restricted leaflet motion. SV = 46 cc. SVi = 28 cc/m2. The mean gradient is 10 mmHG, but this is likely underestimated due to the low SVi. The aortic valve has an indeterminant number of cusps. . There is severe thickening and severe calcifcation of the aortic valve. Aortic valve regurgitation is trivial. There is severe thickening of the aortic valve. There is severe calcifcation of the aortic valve.  Aortic valve mean gradient measures 10.0 mmHg. Aortic valve peak gradient measures 17.6 mmHg. Aortic valve area, by VTI measures 0.90 cm. Pulmonic Valve: The pulmonic valve was grossly normal. Pulmonic valve regurgitation is not visualized. Aorta: The aortic root is normal in size and structure. Venous: The inferior vena cava is normal in size with greater than 50% respiratory variability, suggesting right atrial pressure of 3 mmHg. IAS/Shunts: No atrial level shunt detected by color flow Doppler.  LEFT VENTRICLE PLAX 2D LVIDd:         4.20 cm  Diastology LVIDs:         2.53 cm  LV e' lateral:   6.20 cm/s LV PW:         1.25 cm  LV E/e' lateral: 11.7 LV IVS:        1.09 cm   LV e' medial:    4.13 cm/s LVOT diam:     2.00 cm  LV E/e' medial:  17.6 LV SV:         45.55 ml LV SV Index:   32.73 LVOT Area:     3.14 cm  RIGHT VENTRICLE RV Basal diam:  2.11 cm RV S prime:     7.07 cm/s TAPSE (M-mode): 1.7 cm LEFT ATRIUM             Index       RIGHT ATRIUM           Index LA diam:        3.20 cm 1.92 cm/m  RA Area:     11.80 cm LA Vol (A2C):   20.7 ml 12.42 ml/m RA Volume:   25.40 ml  15.24 ml/m LA Vol (A4C):   48.3 ml 28.97 ml/m LA Biplane Vol: 33.5 ml 20.09 ml/m  AORTIC VALVE AV Area (Vmax):    1.08 cm AV Area (Vmean):   0.98 cm AV Area (VTI):     0.90 cm AV Vmax:           209.50 cm/s AV Vmean:          153.000 cm/s AV VTI:            0.504 m AV Peak Grad:      17.6 mmHg AV Mean Grad:      10.0 mmHg LVOT Vmax:         71.70 cm/s LVOT Vmean:        47.600 cm/s LVOT VTI:          0.145 m LVOT/AV VTI ratio: 0.29  AORTA Ao Root diam: 2.40 cm MITRAL VALVE MV Area (PHT): 3.65 cm     SHUNTS MV Decel Time: 208 msec     Systemic VTI:  0.14 m MV E velocity: 72.80 cm/s   Systemic Diam: 2.00 cm MV A velocity: 129.00 cm/s MV E/A ratio:  0.56 Eleonore Chiquito MD Electronically signed by Eleonore Chiquito MD Signature Date/Time: 02/04/2020/5:32:20 PM    Final    CT HEAD CODE STROKE WO CONTRAST  Result Date: 02/03/2020 CLINICAL DATA:  Code stroke. Ataxia of beginning a acutely 1630 hours. Confusion. EXAM: CT HEAD WITHOUT CONTRAST TECHNIQUE: Contiguous axial images were obtained from the base of the skull through the vertex without intravenous contrast. COMPARISON:  None. FINDINGS: Brain: Generalized brain atrophy and chronic small-vessel disease of the cerebral hemispheric white matter. Old lacunar infarctions of the basal ganglia. Old small vessel infarction of the left cerebellum. No sign of acute infarction, mass lesion, hemorrhage hydrocephalus or extra-axial collection. Vascular: There is atherosclerotic calcification of the major vessels at the base of the brain. Skull: Negative  Sinuses/Orbits: Clear/normal Other: None ASPECTS ([place] Stroke Program Early CT Score) - Ganglionic level infarction (caudate, lentiform nuclei, internal capsule, insula, M1-M3 cortex): 7 - Supraganglionic infarction (M4-M6 cortex): 3 Total score (0-10 with 10 being normal): 10 IMPRESSION: 1. No acute CT finding. Atrophy and chronic small-vessel ischemic changes. 2. ASPECTS is 10. 3. These results were called by telephone at the time of interpretation on 02/03/2020 at 10:33 pm to provider JOSEPH ZAMMIT , who verbally acknowledged these results. Electronically Signed   By: Nelson Chimes M.D.   On: 02/03/2020 22:35    Medications:     stroke: mapping our early stages of recovery book   Does not apply Once   clopidogrel  75 mg Oral Daily   Continuous Infusions:   LOS: 0 days   Geradine Girt  [facility]   How  to contact the [facility] Attending or Consulting provider [place] or covering provider during after hours [place], for this patient?  1. Check the care team in [facility] and look for a) attending/consulting [facility] provider listed and b) the [facility] team listed 2. Log into [email or web address] and use [place]'s universal password to access. If you do not have the password, please contact the hospital operator. 3. Locate the [facility] provider you are looking for under [facility] and page to a number that you can be directly reached. 4. If you still have difficulty reaching the provider, please page the [facility] (Director on Call) for the Hospitalists listed on amion for assistance.  02/05/2020, 1:50 PM

## 2020-02-05 NOTE — Progress Notes (Addendum)
STROKE TEAM PROGRESS NOTE   HISTORY OF PRESENT ILLNESS (per record) Leslie Carr is a 86 y.o. female past medical history of breast cancer, coronary artery disease, hypercholesterolemia, prior stroke with some left hand weakness, presented to the emergency room at [facility] the night of 02/03/2020 with complaints of confusion and difficulty with word finding. She said that she went up to her room, felt a little dizzy has a lightheaded but could not talk and could not bring her words out.  The symptoms lasted for 2 to 3 hours and then resolved. No tingling numbness weakness.  No chest pain.  No nausea vomiting. She reports being back to baseline since then. She was evaluated at [facility] neurology was consulted.  The recommended an MRI and stroke work-up but because of the unavailability of the MRI and some concerning findings on vascular imaging, she was transferred to [facility] for further inpatient neurological evaluation. Patient was on dual antiplatelets but currently only on Plavix. She also has paroxysmal atrial fibrillation not on anticoagulation-currently only on Plavix.   LKW: 4:30 PM on 02/03/2020 tpa given?: no, symptoms resolved and patient outside the window-seen as a transfer after emergent consultation was done by telemedicine neurology at an outside hospital Premorbid modified Rankin scale (mRS): 0 ROS: Complete ROS was performed and is negative except as noted in the HPI.   INTERVAL HISTORY The patient daughter is at the bedside. The patient the patient was previously on Xarelto but did not like the effect. It is unclear why she was on Xarelto. The family reports that she may have had a stroke and a small stroke in the past. The hospitalist notes indicate that she may have had a history of atrial fibrillation but refused anticoagulation. These seem willing to start Eliquis and therefore this will be initiated. The patient's MRI scan  was aborted early because of significant anxiety. Patient seems reluctant to get this repeated.    OBJECTIVE Vitals:   02/04/20 2351 02/05/20 0403 02/05/20 0919 02/05/20 1220  BP: (!) 190/108 125/83 120/67 126/73  Pulse: (!) 103 88 (!) 102 73  Resp:  18 16 18   Temp: 98.2 F (36.8 C) 98.1 F (36.7 C) 98.4 F (36.9 C) 98.1 F (36.7 C)  TempSrc: Oral Oral Oral Oral  SpO2: 97% 99% 98% 97%  Weight:      Height:        CBC:  Recent Labs  Lab 02/03/20 2234  WBC 6.8  NEUTROABS 5.5  HGB 13.3  HCT 42.4  MCV 94.9  PLT [id number]    Basic Metabolic Panel:  Recent Labs  Lab 02/03/20 2234  NA 140  K 3.9  CL 104  CO2 24  GLUCOSE 115*  BUN 24*  CREATININE 1.26*  CALCIUM 8.7*    Lipid Panel:     Component Value Date/Time   CHOL 141 02/04/2020 0633   CHOL 156 01/09/2020 0906   TRIG 63 02/04/2020 0633   HDL 48 02/04/2020 0633   HDL 53 01/09/2020 0906   CHOLHDL 2.9 02/04/2020 0633   VLDL 13 02/04/2020 0633   LDLCALC 80 02/04/2020 0633   LDLCALC 85 01/09/2020 0906   HgbA1c:  Lab Results  Component Value Date   HGBA1C 5.6 02/04/2020   Urine Drug Screen:     Component Value Date/Time   LABOPIA NONE DETECTED 02/03/2020 2256   COCAINSCRNUR NONE DETECTED 02/03/2020 2256   LABBENZ NONE DETECTED 02/03/2020 2256   AMPHETMU NONE DETECTED  02/03/2020 2256   THCU NONE DETECTED 02/03/2020 2256   LABBARB NONE DETECTED 02/03/2020 2256    Alcohol Level     Component Value Date/Time   ETH <10 02/03/2020 2234    IMAGING  CT Angio Head W or Wo Contrast CT Angio Neck W and/or Wo Contrast 02/04/2020 IMPRESSION:  No intracranial large or medium vessel occlusion.  Advanced irregular atherosclerotic disease of the aorta. Dilatation of the ascending aorta with diameter as large as 3.7 cm in the region studied.  Atherosclerotic disease at both carotid bifurcations and ICA bulb regions. 50% stenosis of the left ICA bulb. 60% stenosis of the right carotid bifurcation and ICA bulb.   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  Circumferential mural thrombus affecting the proximal 3.5 cm of the vessel, with patent lumen through that region of only 2.5 mm.  Complete occlusion of the subclavian artery just beyond that.  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  Reconstitution in the mid upper cervical region by cervical collaterals, with subsequent antegrade flow beyond that. 70% stenosis of the left V4 segment.  30% stenosis of the proximal right subclavian artery.  70% stenosis of the right vertebral artery origin but wide patency beyond that to the basilar.   DG Abd 1 View 02/04/2020 IMPRESSION:  1. No unexpected radiopaque or metallic foreign bodies are identified in the abdomen or pelvis.  2. Extensive vascular calcium.  3. Normal bowel gas pattern.   MR BRAIN WO CONTRAST 02/05/2020 IMPRESSION: Patient terminated the study after T1 scout imaging was performed. Therefore, there is very limited information. There is no gross morphologic change when compared to the CT studies of yesterday.   DG CHEST PORT 1 VIEW 02/04/2020 IMPRESSION:  1. No acute airspace disease.  2. Stable right middle lobe pulmonary mass, reportedly biopsy-proven amyloidosis.  3. Stable thoracic aortic ectasia and atherosclerosis.   ECHOCARDIOGRAM COMPLETE 02/04/2020 IMPRESSIONS   1. Left ventricular ejection fraction, by estimation, is 60 to 65%. The left ventricle has normal function. The left ventricle has no regional wall motion abnormalities. There is concentric left ventricular hypertrophy. Left ventricular diastolic parameters are consistent with Grade I diastolic dysfunction (impaired relaxation).   2. Right ventricular systolic function is normal. The right ventricular size is normal. Tricuspid regurgitation signal is inadequate for assessing PA pressure.   3. Left atrial size was mildly dilated.   4. The mitral valve is degenerative. Mild mitral valve  regurgitation.   5. Mild to moderate aortic stenosis is present. The aortic valve is severely calcified with restricted leaflet motion. SV = 46 cc. SVi = 28 cc/m2. The mean gradient is 10 mmHG, but this is likely underestimated due to the low SVi. The aortic valve has an indeterminant number of cusps. Aortic valve regurgitation is trivial . Aortic valve mean gradient measures 10.0 mmHg. Aortic valve Vmax measures 2.10 m/s.   6. The inferior vena cava is normal in size with greater than 50% respiratory variability, suggesting right atrial pressure of 3 mmHg. Comparison(s): A prior study was performed on 09/21/2018. No significant change from prior study.   CT HEAD CODE STROKE WO CONTRAST 02/03/2020 IMPRESSION:  1. No acute CT finding. Atrophy and chronic small-vessel ischemic changes.  2. ASPECTS is 10.   ECG - SR rate 73 BPM. (See cardiology reading for complete details)  PHYSICAL EXAM Blood pressure 126/73, pulse 73, temperature 98.1 F (36.7 C), temperature source Oral, resp. rate 18, height 5\' 3"  (1.6 m), weight 64 kg, SpO2  97 %. GENERAL: She is doing well at this time.  HEENT: Neck is supple and no trauma appreciated.  ABDOMEN: soft  EXTREMITIES: No edema; significantly reduced range of motion of the left shoulder due to contracture/frozen shoulder.  BACK: This is normal.  SKIN: Normal by inspection.    MENTAL STATUS: Alert and oriented. Speech, language and cognition are generally intact. Judgment and insight normal.   CRANIAL NERVES: Pupils are equal, round and reactive to light and accomodation; extra ocular movements are full, there is no significant nystagmus; visual fields are full; upper and lower facial muscles are normal in strength and symmetric, there is no flattening of the nasolabial folds; tongue is midline; uvula is midline; shoulder elevation is normal.  MOTOR: Normal tone, bulk and strength; no pronator drift.  COORDINATION: Left finger to nose is normal, right  finger to nose is normal, No rest tremor; no intention tremor; no postural tremor; no bradykinesia.  REFLEXES: Deep tendon reflexes are symmetrical and normal.   SENSATION: Normal to light touch, temperature, and pain.          ASSESSMENT/PLAN Ms. NAKAYAH BONINI is a 86 y.o. female with history of breast cancer, coronary artery disease, hypercholesterolemia, prior stroke with some left hand weakness, and PAF (not anticoagulated) presenting with an episode of confusion and difficulty with word finding that resolved in 2 - 3 hours.  She did not receive IV t-PA due to late presentation (>4.5 hours from time of onset) and resolution of deficits.  Small stroke vs TIA:  May need to repeat head CT - pt didn't tolerate MRI  Resultant resolution of presumed aphasia  Code Stroke CT Head - 1. No acute CT finding. Atrophy and chronic small-vessel ischemic changes. ASPECTS is 10.   CT head - not ordered  MRI head - Patient terminated the study after T1 scout imaging was performed. Therefore, there is very limited information. There is no gross morphologic change when compared to the CT studies of yesterday.   MRA head - not ordered  CTA H&N - 60% stenosis of the right carotid bifurcation and ICA bulb. 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 Circumferential mural thrombus affecting the proximal 3.5 cm of the vessel, with patent lumen through that region of only 2.5 mm. Complete occlusion of the subclavian artery just beyond that. 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 70% stenosis of the right vertebral artery origin but wide patency beyond that to the basilar.   CT Perfusion - not ordered  Carotid Doppler - CTA neck performed - carotid dopplers not indicated.  2D Echo - EF 60 - 65%. No cardiac source of emboli identified.   Sars Corona Virus 2 - negative  LDL - 80  HgbA1c - 5.6  UDS - negative  VTE prophylaxis - SCDs Diet  Diet Order            Diet  Heart Room service appropriate? Yes; Fluid consistency: Thin  Diet effective now              clopidogrel 75 mg daily prior to admission, now on No antithrombotic  Patient counseled to be compliant with her antithrombotic medications  Ongoing aggressive stroke risk factor management  Therapy recommendations:  No OT f/u ; No PT f/u  Disposition:  Pending  Hypertension  Home BP meds: none   Current BP meds: none   Blood pressure somewhat high at times but within post stroke/TIA parameters . Permissive hypertension (OK if < 220/120)  but gradually normalize in 5-7 days  . Long-term BP goal normotensive  Hyperlipidemia  Home Lipid lowering medication: Pravachol 40 mg daily  LDL 80, goal < 70  Current lipid lowering medication: none - Consider trial of Zocor - Lipitor intolerance  Continue statin at discharge  Lipitor intolerance  Other Stroke Risk Factors  Advanced age  Former cigarette smoker - quit  Previous stroke  Coronary artery disease  Diastolic dysfunction with CHF  Other Active Problems  Code status - Full  Dilatation of the ascending aorta with diameter as large as 3.7 cm in the region studied  Stable right middle lobe pulmonary mass, reportedly biopsy-proven amyloidosis.   CKD - stage 3b - creatinine 1.26  Diffuse cerebral vascular disease  Will resume Plavix   Hospital day # 0  This patient presents with about 1 to 2 hours of word finding difficulties suspicious for aphasia. Initial imaging was negative repeat imaging is terminated because of severe anxiety and the patient was reluctant to get this done. Extensive discussion is carried out at the bedside with the family and the patient. Given the atrial fibrillation, the patient should be on anticoagulation. Eliquis will be initiated. The patient apparently has coronary artery disease and had a stent placed about 6 months ago. She will therefore need to be on an antiplatelet agent in addition  to the Eliquis.   To contact Stroke Continuity provider, please refer to [email or web address] After hours, contact General Neurology

## 2020-02-05 NOTE — Consult Note (Signed)
Neurology Consultation  Reason for Consult: Transfer for stroke/TIA work-up from [facility] Referring Physician: Dr. Denton Brick  CC: Word finding difficulty and confusion  History is obtained from: Patient, chart  HPI: Leslie Carr is a 86 y.o. female past medical history of breast cancer, coronary artery disease, hypercholesterolemia, prior stroke with some left hand weakness, presented to the emergency room at [facility] the night of 02/03/2020 with complaints of confusion and difficulty with word finding. She said that she went up to her room, felt a little dizzy has a lightheaded but could not talk and could not bring her words out.  The symptoms lasted for 2 to 3 hours and then resolved. No tingling numbness weakness.  No chest pain.  No nausea vomiting. She reports being back to baseline since then. She was evaluated at [place] neurology was consulted.  The recommended an MRI and stroke work-up but because of the unavailability of the MRI and some concerning findings on vascular imaging, she was transferred to [facility] for further inpatient neurological evaluation.  Patient was on dual antiplatelets but currently only on Plavix. She also has paroxysmal atrial fibrillation not on anticoagulation-currently only on Plavix.   LKW: 4:30 PM on 02/03/2020 tpa given?: no, symptoms resolved and patient outside the window-seen as a transfer after emergent consultation was done by telemedicine neurology at an outside hospital Premorbid modified Rankin scale (mRS): 0 ROS: Complete ROS was performed and is negative except as noted in the HPI.  Past Medical History:  Diagnosis Date  . Acute metabolic encephalopathy [id number]  . Breast cancer ([place])   . Coronary artery disease due to calcified coronary lesion 09/2018   ACS PCI of OM2 on 09/21/2018 followed by staged orbital atherectomy based PCI of ostial to distal RCA.  (Details in Surgical  History)  . Diastolic dysfunction with chronic heart failure ([place])   . History of bronchitis   . Hypercholesteremia   . Mild aortic stenosis by prior echocardiogram 09/2018   Mean gradient 14 million mercury, peak 24 mmHg  . Numbness and tingling in left hand   . Pleural effusion on left   . Shortness of breath dyspnea    "only going up a flight of stairs"  . Stroke [facility]) 2014   left sided weakness - mainly left hand    Family History  Problem Relation Age of Onset  . Hypertension Mother   . Hypertension Father   . Heart disease Neg Hx   . Heart attack Neg Hx   . Hyperlipidemia Neg Hx   . Heart failure Neg Hx     Social History:   reports that she has quit smoking. She has never used smokeless tobacco. She reports that she does not drink alcohol or use drugs.  Medications  Current Facility-Administered Medications:  .   stroke: mapping our early stages of recovery book, , Does not apply, Once, Humphrey Rolls, Mohammad Z, DO .  acetaminophen (TYLENOL) tablet 650 mg, 650 mg, Oral, Q4H PRN **OR** acetaminophen (TYLENOL) 160 MG/5ML solution 650 mg, 650 mg, Per Tube, Q4H PRN **OR** acetaminophen (TYLENOL) suppository 650 mg, 650 mg, Rectal, Q4H PRN, Edmonia Lynch, DO   Exam: Current vital signs: BP (!) 190/108 (BP Location: Left Arm)   Pulse (!) 103   Temp 98.2 F (36.8 C) (Oral)   Resp 18   Ht 5\' 3"  (1.6 m)   Wt 64 kg   SpO2 97%   BMI 24.99 kg/m  Vital signs in last 24 hours: Temp:  [98.2 F (36.8 C)-99 F (37.2 C)] 98.2 F (36.8 C) (02/13 2351) Pulse Rate:  [66-110] 103 (02/13 2351) Resp:  [13-20] 18 (02/13 2200) BP: (107-190)/(51-108) 190/108 (02/13 2351) SpO2:  [94 %-100 %] 97 % (02/13 2351) General: Patient was seen past midnight and was sleeping in bed.  She woke up to voice and was in no apparent distress. HEENT: Normocephalic atraumatic CVS: Regular rate rhythm Abdomen: Nondistended nontender Chest clear to auscultation Extremities warm well  perfused Neurological exam She is sleepy but follows all commands.  Prefers to keep her eyes closed. Her speech is not dysarthric She has no aphasia but has poor attention concentration. She did not completely cooperate with memory testing at this time Cranial nerves: Pupils equal round reactive light, extraocular movements appear intact, visual fields appear full, face appears symmetric, tongue and palate appear midline. Motor exam: Right upper and lower extremity are antigravity without drift.  Left upper extremity and lower extremity exam reveals drift in both which she says is secondary to pain-she says that she had gotten a cramp in those many many months ago and that has not gone away-I suspect that this is a residual left-sided weakness from her prior stroke. Sensory exam: Intact to light touch all over Coordination: Did not cooperate to perform Gait testing deferred at this time NIH stroke scale: 1a Level of Conscious.: 0 1b LOC Questions: 0 1c LOC Commands: 0 2 Best Gaze: 0 3 Visual: 0 4 Facial Palsy: 0 5a Motor Arm - left: 1 5b Motor Arm - Right: 0 6a Motor Leg - Left: 1 6b Motor Leg - Right: 0 7 Limb Ataxia: 0 8 Sensory: 0 9 Best Language: 0 10 Dysarthria: 0 11 Extinct. and Inatten.: 0 TOTAL: 2  Labs I have reviewed labs in epic and the results pertinent to this consultation are:  CBC    Component Value Date/Time   WBC 6.8 02/03/2020 2234   RBC 4.47 02/03/2020 2234   HGB 13.3 02/03/2020 2234   HGB 14.1 01/09/2020 0906   HGB 13.5 03/03/2016 0946   HCT 42.4 02/03/2020 2234   HCT 43.5 01/09/2020 0906   HCT 41.4 03/03/2016 0946   PLT 269 02/03/2020 2234   PLT 289 01/09/2020 0906   MCV 94.9 02/03/2020 2234   MCV 90 01/09/2020 0906   MCV 89 03/03/2016 0946   MCH 29.8 02/03/2020 2234   MCHC 31.4 02/03/2020 2234   RDW 13.6 02/03/2020 2234   RDW 12.9 01/09/2020 0906   RDW 13.7 03/03/2016 0946   LYMPHSABS 0.4 (L) 02/03/2020 2234   LYMPHSABS 0.7 (L) 03/03/2016  0946   MONOABS 0.5 02/03/2020 2234   EOSABS 0.2 02/03/2020 2234   EOSABS 0.3 03/03/2016 0946   BASOSABS 0.1 02/03/2020 2234   BASOSABS 0.0 03/03/2016 0946    CMP     Component Value Date/Time   NA 140 02/03/2020 2234   NA 142 01/09/2020 0906   NA 139 03/03/2016 0946   K 3.9 02/03/2020 2234   K 4.1 03/03/2016 0946   CL 104 02/03/2020 2234   CO2 24 02/03/2020 2234   CO2 28 03/03/2016 0946   GLUCOSE 115 (H) 02/03/2020 2234   GLUCOSE 120 03/03/2016 0946   BUN 24 (H) 02/03/2020 2234   BUN 19 01/09/2020 0906   BUN 14.2 03/03/2016 0946   CREATININE 1.26 (H) 02/03/2020 2234   CREATININE 1.52 (H) 04/25/2019 1447   CREATININE 1.1 03/03/2016 0946   CALCIUM 8.7 (L)  02/03/2020 2234   CALCIUM 9.1 03/03/2016 0946   PROT 6.9 02/03/2020 2234   PROT 7.0 01/09/2020 0906   PROT 7.2 03/03/2016 0946   ALBUMIN 3.8 02/03/2020 2234   ALBUMIN 4.2 01/09/2020 0906   ALBUMIN 3.4 (L) 03/03/2016 0946   AST 17 02/03/2020 2234   AST 15 03/03/2016 0946   ALT 15 02/03/2020 2234   ALT 9 03/03/2016 0946   ALKPHOS 62 02/03/2020 2234   ALKPHOS 79 03/03/2016 0946   BILITOT 0.6 02/03/2020 2234   BILITOT 0.4 01/09/2020 0906   BILITOT 0.37 03/03/2016 0946   GFRNONAA 39 (L) 02/03/2020 2234   GFRAA 45 (L) 02/03/2020 2234    Current admission 2D echo-unchanged from 2019. IMPRESSIONS  1. Left ventricular ejection fraction, by estimation, is 60 to 65%. The  left ventricle has normal function. The left ventricle has no regional  wall motion abnormalities. There is concentric left ventricular  hypertrophy. Left ventricular diastolic  parameters are consistent with Grade I diastolic dysfunction (impaired  relaxation).  2. Right ventricular systolic function is normal. The right ventricular  size is normal. Tricuspid regurgitation signal is inadequate for assessing  PA pressure.  3. Left atrial size was mildly dilated.  4. The mitral valve is degenerative. Mild mitral valve regurgitation.  5. Mild to  moderate aortic stenosis is present. The aortic valve is  severely calcified with restricted leaflet motion. SV = 46 cc. SVi = 28  cc/m2. The mean gradient is 10 mmHG, but this is likely underestimated due  to the low SVi. The aortic valve has  an indeterminant number of cusps. Aortic valve regurgitation is trivial .  Aortic valve mean gradient measures 10.0 mmHg. Aortic valve Vmax measures  2.10 m/s.  6. The inferior vena cava is normal in size with greater than 50%  respiratory variability, suggesting right atrial pressure of 3 mmHg.  LDL-85 A1c-5.6.  Imaging I have reviewed the images obtained CT-scan of the brain-no acute changes.  Generalized atrophy.  Old lacunar infarctions basal ganglia.  No bleed. CTA head and neck: No emergent large vessel occlusion, advanced atherosclerotic disease of the aorta, dilatation of the ascending aorta with diameters largest 3.7 cm. Atherosclerotic disease at both carotid bifurcations-50% stenosis of the left ICA above, 60% stenosis of the right bifurcation.  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  Circumferential mural thrombus in the proximal 3.5 cm of this vessel with patent lumen through that region of only 2.5 mm.  Complete occlusion of the subclavian just beyond that.  Right vertebral occlusion at the origin.  Reconstitution of the mid upper cervical region by cervical collaterals with subsequent anterograde flow beyond that.  70% stenosis of the left V4 segment. 30% stenosis of the proximal right subclavian and 70% stenosis of the right vertebral artery origin but wide patency beyond that to the basilar.   Assessment: 86 year old man past history of breast cancer coronary artery disease hypercholesterolemia, prior stroke with residual left weakness presented to the [facility] emergency room for evaluation of word finding difficulty and dizziness. Symptoms completely resolved after lasting 2 to 3 hours. She was  admitted for further work-up and transferred to [facility] due to unavailability of MRI and further inpatient neurological consultation at any [facility]. Her vascular imaging revealed multiple stenoses-predominantly in the right and left ICA bifurcations, and severely diseased posterior circulation. It is unclear at this time if her symptoms cannot just clearly be localized to the left hemisphere due to the word  finding difficulty or this dizziness and inability to talk was more of a vertebrobasilar insufficiency.  Impression: Evaluate for stroke/TIA versus vertebrobasilar insufficiency History of prior stroke   Recommendations: Agree with admission for stroke work-up Already completed echo, LDL, A1c. Continue on Plavix for now. MRI brain without contrast-if it shows stroke, timing of anticoagulation will depend on the size of the stroke or presence or absence of stroke. Given history of CAD as well as severely atherosclerotic cervical and intracranial vessels -might require anticoagulation +1 antiplatelet. PT OT speech therapy Frequent neurochecks Based on the MRI, consider endovascular/vascular surgery consultation if one of the carotid territories shows a stroke as she has at least 50% stenosis in both carotids. Although she is not in the timeframe for permissive hypertension, given severely atherosclerotic and narrow posterior circulation, I would allow for permissive hypertension for the next couple of days prior to normalizing her blood pressures.  -- Amie Portland, MD [facility] Pager: [phone] If 7pm to 7am, please call on call as listed on AMION.

## 2020-02-06 ENCOUNTER — Inpatient Hospital Stay (HOSPITAL_COMMUNITY): Payer: Medicare Other

## 2020-02-06 MED ORDER — APIXABAN 5 MG PO TABS: 5.0000 mg | ORAL_TABLET | Freq: Two times a day (BID) | ORAL | 0 refills | Status: DC

## 2020-02-06 MED ORDER — ASPIRIN 81 MG PO TBEC: 81.0000 mg | DELAYED_RELEASE_TABLET | Freq: Every day | ORAL | Status: AC

## 2020-02-06 MED FILL — ELIQUIS 5 MG TABLET: 5 | 30 days supply | Qty: 60 | Fill #0

## 2020-02-06 NOTE — Progress Notes (Signed)
Pharmacy - Eliquis  Reviewed Eliquis with patient Discussed adverse effects and dosing  Thank you Anette Guarneri, PharmD

## 2020-02-06 NOTE — Progress Notes (Signed)
Patient discharging home. Daughter and Son in law here for transportation home. Discharge paperwork went over with patient and family. All questions and concerns addressed. TOC pharmacy has delivered Eliquis. IV taken out, tele removed. Patient taken down in wheelchair. [place]

## 2020-02-06 NOTE — TOC Benefit Eligibility Note (Signed)
Transition of Care [facility]) Benefit Eligibility Note    Patient Details  Name: Leslie Carr MRN: [id number] Date of Birth: [birth year]   Medication/Dose: Arne [place]  5 MG BID  Covered?: Yes  Tier: (TIER- 1 DRUG)  Prescription Coverage [facility]erred [facility]  Spoke with Person/Company/Phone Number:: KEMI   @ [facility] RX # [phone]  Co-Pay: $64.50     Deductible: Unmet       Memory Argue Phone Number: 02/06/2020, 10:10 AM

## 2020-02-06 NOTE — TOC Transition Note (Signed)
Transition of Care [facility]) - CM/SW Discharge Note   Patient Details  Name: Leslie Carr MRN: [id number] Date of Birth: [birth year]  [facility] of [facility]) CM/SW Contact:  Pollie Friar, RN Phone Number: 02/06/2020, 10:37 AM   Clinical Narrative:    Pt discharging home with daughter. No f/u per PT/OT. Recommendations for tub bench. Pt states she has this DME at home.  Eliquis to be delivered to the room per [place].  Pt has transportation home.   Final next level of care: Home/Self Care Barriers to Discharge: No Barriers Identified   Patient Goals and CMS Choice        Discharge Placement                       Discharge Plan and Services                                     Social Determinants of Health (SDOH) Interventions     Readmission Risk Interventions No flowsheet data found.

## 2020-02-06 NOTE — Discharge Summary (Signed)
Physician Discharge Summary  Leslie Carr [id number] DOB: [birth year] DOA: 02/03/2020  PCP: [facility]  Admit date: 02/03/2020 Discharge date: 02/06/2020  Admitted From: Home Discharge disposition: Home   Recommendations for Outpatient Follow-Up:   1. Patient started on Eliquis for paroxysmal A. fib (had tried Xarelto in the past and not like to)-discussed with daughter who is a Marine scientist 2. Change Plavix to aspirin now that patient is on Eliquis 3. Will not challenge patient with another statin due to allergy, and age 24. Outpatient referral to neurology   Discharge Diagnosis:   Principal Problem:   TIA (transient ischemic attack) Active Problems:   Hyperlipidemia   PAF (paroxysmal atrial fibrillation) ([place]); CHA2DS2-VASc Score = 7.  Patient declines DOAC   Coronary artery disease involving native heart without angina pectoris   Gastrointestinal hemorrhage   Acute metabolic encephalopathy    Discharge Condition: Improved.  Diet recommendation: Low sodium, heart healthy.  Carbohydrate-modified.  Regular.  Wound care: None.  Code status: Full.   History of Present Illness:   Leslie Carr is a 86 y.o. female with medical history significant of hypertension, hyperlipidemia, atrial fibrillation not on any anticoagulation, CVA with residual left arm weakness and coronary artery disease presented to ED for evaluation of altered mental status and aphasia.  During my evaluation, patient is awake alert and oriented to time person and place and at her baseline.  Patient states that she was talking to her daughter and son-in-law when suddenly she felt that something is wrong and became confused and dizzy and was unable to speak.  Patient states that the symptoms lasted for 2 to 3 hours and then completely resolved.  Patient denies any fever, chills, headache, blurriness of vision, weakness, numbness and tingling sensation in any part of her body  and also denied any recent injury to her head.  Patient also denies chest pain, shortness of breath, nausea, vomiting, abdominal pain and urinary symptoms.   Hospital Course by Problem:   TIA with known A. fib (CHA2DS2-VASc 2 score is at least 7) -Was not on NOAC prior -Patient was not able to tolerate MRI due to claustrophobia -Repeat head CT without CVA -Echo: EF 60 to 65% -LDL 80 -Hemoglobin A1c 5.6 -Change Plavix to baby aspirin -No follow-up with PT OT  Hyperlipidemia -Continue Pravachol -Patient did not tolerate Lipitor -LDL is 80 so will not change to statin due to allergy  Dementia---patient has memory and cognitive deficits, appears to be at her baseline  CKD (chronic kidney disease), stage III (HCC) -Serum creatinine 1.26on admission which is similar to prior creatinine level on her records renally adjust medications, avoid nephrotoxic agents / dehydration / hypotension  PAD-AAA (abdominal aortic aneurysm)--appears stable-Follow-up with ultrasound in 2 years as recommended  history of prior GI bleed---in April 2020 patient had GI bleed requiring transfusion in the setting of aspirin, Plavix, and OTC NSAID and additional aspirin use for arthritis -EGD in 04/11/2019 revealed distal esophageal ulcer with active bleeding treated with APC as well as small prepyloric gastric ulcer and hiatal hernia -No further bleeding or transfusion since despite being on aspirin and Plavix concomitantlyuntil 12/2019 -Monitor closely while on Eliquis  Medical Consultants:    Neurology  Discharge Exam:   Vitals:   02/06/20 0419 02/06/20 1022  BP: (!) 155/72 (!) 144/85  Pulse: 60 85  Resp: 18 16  Temp: (!) 97.5 F (36.4 C) 98 F (36.7 C)  SpO2: 99% 96%  Vitals:   02/05/20 2026 02/06/20 0002 02/06/20 0419 02/06/20 1022  BP: 135/61 (!) 162/79 (!) 155/72 (!) 144/85  Pulse: 76 81 60 85  Resp: 16 17 18 16   Temp: 98 F (36.7 C) 98.1 F (36.7 C) (!) 97.5 F (36.4 C) 98 F  (36.7 C)  TempSrc: Oral Oral Oral Oral  SpO2: 98% 97% 99% 96%  Weight:      Height:        General exam: Appears calm and comfortable.  The results of significant diagnostics from this hospitalization (including imaging, microbiology, ancillary and laboratory) are listed below for reference.     Procedures and Diagnostic Studies:   CT Angio Head W or Wo Contrast  Result Date: 02/04/2020 CLINICAL DATA:  Acute onset of confusion and disorientation. No acute finding by head CT. EXAM: CT ANGIOGRAPHY HEAD AND NECK TECHNIQUE: Multidetector CT imaging of the head and neck was performed using the standard protocol during bolus administration of intravenous contrast. Multiplanar CT image reconstructions and MIPs were obtained to evaluate the vascular anatomy. Carotid stenosis measurements (when applicable) are obtained utilizing NASCET criteria, using the distal internal carotid diameter as the denominator. CONTRAST:  60mL OMNIPAQUE IOHEXOL 350 MG/ML SOLN COMPARISON:  Head CT 02/03/2020 FINDINGS: CTA NECK FINDINGS Aortic arch: Pronounced aortic atherosclerosis with irregular plaque. Ascending aortic diameter as large as 3.7 cm in the region visualized. No origin stenosis of the innominate or left common carotid artery. 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 Right carotid system: Common carotid artery is patent till the bifurcation region. Extensive soft and calcified plaque just proximal to the bifurcation with luminal diameter as narrow as 2.3 mm. At the bifurcation, there is calcified plaque extending into the ICA bulb with minimal diameter of the distal bulb measuring 1.9 mm. More distal cervical ICA is widely patent with a diameter of 5 mm. Therefore, there is 60% stenosis. Left carotid system: Common carotid artery shows some plaque but is widely patent to the bifurcation region. Calcified plaque at the carotid  bifurcation and throughout the ICA bulb. Minimal diameter of the distal bulb measures 2 mm. Compared to a more distal ICA diameter of 4 mm, this indicates a 50% stenosis. Cervical ICA is widely patent beyond that. Vertebral arteries: Moderate stenosis of the proximal right subclavian artery, estimated at 30%. Calcified plaque affecting the right vertebral artery origin with stenosis of 70%. This dominant vessel is widely patent beyond that to the foramen magnum. As noted above, there is long segment stenosis of the proximal left subclavian artery due to circumferential plaque, with luminal diameter of only 2.5 mm. Left subclavian artery is completely occluded at about the expected level of the left vertebral artery origin. The left vertebral artery origin is occluded. There is some reconstituted flow in the mid to distal vertebral artery secondary 2 cervical collaterals. Vessel does show flow at the foramen magnum. Skeleton: Ordinary cervical spondylosis. Other neck: No soft tissue mass or lymphadenopathy. Dense calcification within the left thyroid lobe likely benign. Upper chest: Pleural and parenchymal scarring at the lung apices left worse than right. Review of the MIP images confirms the above findings CTA HEAD FINDINGS Anterior circulation: Both internal carotid arteries are patent through the skull base and siphon regions. There is ordinary siphon atherosclerotic calcification with stenosis estimated at 30%. The anterior and middle cerebral vessels are patent. No large or medium vessel occlusion. No correctable proximal intracranial vessel stenosis. Posterior  circulation: Dominant right vertebral artery is widely patent to the basilar. Non dominant reconstituted left vertebral artery shows 70% stenosis in the V4 segment but does give some supply to the basilar. Dominant PICA arises from the left vertebral artery. No basilar stenosis. Superior cerebellar and posterior cerebral arteries show flow. There is some  atherosclerotic narrowing and irregularity of the PCA branches. Venous sinuses: Patent and normal. Anatomic variants: None other significant. Review of the MIP images confirms the above findings IMPRESSION: No intracranial large or medium vessel occlusion. Advanced irregular atherosclerotic disease of the aorta. Dilatation of the ascending aorta with diameter as large as 3.7 cm in the region studied. Atherosclerotic disease at both carotid bifurcations and ICA bulb regions. 50% stenosis of the left ICA bulb. 60% stenosis of the right carotid bifurcation and ICA bulb. 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 Circumferential mural thrombus affecting the proximal 3.5 cm of the vessel, with patent lumen through that region of only 2.5 mm. Complete occlusion of the subclavian artery just beyond that. 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 Reconstitution in the mid upper cervical region by cervical collaterals, with subsequent antegrade flow beyond that. 70% stenosis of the left V4 segment. 30% stenosis of the proximal right subclavian artery. 70% stenosis of the right vertebral artery origin but wide patency beyond that to the basilar. Electronically Signed   By: Nelson Chimes M.D.   On: 02/04/2020 00:57   DG Abd 1 View  Result Date: 02/04/2020 CLINICAL DATA:  Assess for presence of retained hardware EXAM: ABDOMEN - 1 VIEW COMPARISON:  CT abdomen pelvis 04/19/2019 FINDINGS: No unexpected radiopaque or metallic foreign bodies are identified. Telemetry leads overlie the abdomen and chest. Normal bowel gas pattern. Extensive vascular calcium is noted. Degenerative changes present in the spine and pelvis. Lung bases are clear. IMPRESSION: 1. No unexpected radiopaque or metallic foreign bodies are identified in the abdomen or pelvis. 2. Extensive vascular calcium. 3. Normal bowel gas pattern. Electronically Signed   By: Lovena Le M.D.   On: 02/04/2020 22:34   CT Angio Neck W and/or Wo  Contrast  Result Date: 02/04/2020 CLINICAL DATA:  Acute onset of confusion and disorientation. No acute finding by head CT. EXAM: CT ANGIOGRAPHY HEAD AND NECK TECHNIQUE: Multidetector CT imaging of the head and neck was performed using the standard protocol during bolus administration of intravenous contrast. Multiplanar CT image reconstructions and MIPs were obtained to evaluate the vascular anatomy. Carotid stenosis measurements (when applicable) are obtained utilizing NASCET criteria, using the distal internal carotid diameter as the denominator. CONTRAST:  6mL OMNIPAQUE IOHEXOL 350 MG/ML SOLN COMPARISON:  Head CT 02/03/2020 FINDINGS: CTA NECK FINDINGS Aortic arch: Pronounced aortic atherosclerosis with irregular plaque. Ascending aortic diameter as large as 3.7 cm in the region visualized. No origin stenosis of the innominate or left common carotid artery. 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 Right carotid system: Common carotid artery is patent till the bifurcation region. Extensive soft and calcified plaque just proximal to the bifurcation with luminal diameter as narrow as 2.3 mm. At the bifurcation, there is calcified plaque extending into the ICA bulb with minimal diameter of the distal bulb measuring 1.9 mm. More distal cervical ICA is widely patent with a diameter of 5 mm. Therefore, there is 60% stenosis. Left carotid system: Common carotid artery shows some plaque but is widely patent to the bifurcation region. Calcified plaque at the carotid bifurcation  and throughout the ICA bulb. Minimal diameter of the distal bulb measures 2 mm. Compared to a more distal ICA diameter of 4 mm, this indicates a 50% stenosis. Cervical ICA is widely patent beyond that. Vertebral arteries: Moderate stenosis of the proximal right subclavian artery, estimated at 30%. Calcified plaque affecting the right vertebral artery origin  with stenosis of 70%. This dominant vessel is widely patent beyond that to the foramen magnum. As noted above, there is long segment stenosis of the proximal left subclavian artery due to circumferential plaque, with luminal diameter of only 2.5 mm. Left subclavian artery is completely occluded at about the expected level of the left vertebral artery origin. The left vertebral artery origin is occluded. There is some reconstituted flow in the mid to distal vertebral artery secondary 2 cervical collaterals. Vessel does show flow at the foramen magnum. Skeleton: Ordinary cervical spondylosis. Other neck: No soft tissue mass or lymphadenopathy. Dense calcification within the left thyroid lobe likely benign. Upper chest: Pleural and parenchymal scarring at the lung apices left worse than right. Review of the MIP images confirms the above findings CTA HEAD FINDINGS Anterior circulation: Both internal carotid arteries are patent through the skull base and siphon regions. There is ordinary siphon atherosclerotic calcification with stenosis estimated at 30%. The anterior and middle cerebral vessels are patent. No large or medium vessel occlusion. No correctable proximal intracranial vessel stenosis. Posterior circulation: Dominant right vertebral artery is widely patent to the basilar. Non dominant reconstituted left vertebral artery shows 70% stenosis in the V4 segment but does give some supply to the basilar. Dominant PICA arises from the left vertebral artery. No basilar stenosis. Superior cerebellar and posterior cerebral arteries show flow. There is some atherosclerotic narrowing and irregularity of the PCA branches. Venous sinuses: Patent and normal. Anatomic variants: None other significant. Review of the MIP images confirms the above findings IMPRESSION: No intracranial large or medium vessel occlusion. Advanced irregular atherosclerotic disease of the aorta. Dilatation of the ascending aorta with diameter as large  as 3.7 cm in the region studied. Atherosclerotic disease at both carotid bifurcations and ICA bulb regions. 50% stenosis of the left ICA bulb. 60% stenosis of the right carotid bifurcation and ICA bulb. 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 Circumferential mural thrombus affecting the proximal 3.5 cm of the vessel, with patent lumen through that region of only 2.5 mm. Complete occlusion of the subclavian artery just beyond that. 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 Reconstitution in the mid upper cervical region by cervical collaterals, with subsequent antegrade flow beyond that. 70% stenosis of the left V4 segment. 30% stenosis of the proximal right subclavian artery. 70% stenosis of the right vertebral artery origin but wide patency beyond that to the basilar. Electronically Signed   By: Nelson Chimes M.D.   On: 02/04/2020 00:57   DG CHEST PORT 1 VIEW  Result Date: 02/04/2020 CLINICAL DATA:  Presence of retained hardware, metabolic encephalopathy EXAM: PORTABLE CHEST 1 VIEW COMPARISON:  04/19/2019, 09/19/2018, 11/22/2019 FINDINGS: Single frontal view of the chest demonstrates a stable cardiac silhouette. There is extensive thoracic aortic atherosclerosis and ectasia unchanged since prior exam. The partially calcified right middle lobe nodule seen previously is unchanged. There is chronic left pleural thickening. No airspace disease, effusion, or pneumothorax. Severe left shoulder arthroplasty again noted. IMPRESSION: 1. No acute airspace disease. 2. Stable right middle lobe pulmonary mass, reportedly biopsy-proven amyloidosis. 3. Stable thoracic aortic ectasia and atherosclerosis. Electronically Signed   By: Legrand Como  Owens Shark M.D.   On: 02/04/2020 22:33   ECHOCARDIOGRAM COMPLETE  Result Date: 02/04/2020    ECHOCARDIOGRAM REPORT   Patient Name:   Leslie [facility] Date of Exam: 02/04/2020 Medical Rec #:  [id number]        Height:       63.0 in Accession #:    [id number]        Weight:       141.1 lb Date of Birth:  1931-07-22        BSA:          1.67 m Patient Age:    43 years         BP:           180/81 mmHg Patient Gender: F                HR:           78 bpm. Exam Location:  [facility] Procedure: 2D Echo, Cardiac Doppler and Color Doppler Indications:    TIA  History:        Patient has prior history of Echocardiogram examinations, most                 recent 09/21/2018. CAD, Stroke, Aortic Valve Disease,                 Arrythmias:Atrial Fibrillation; Risk Factors:Hypertension and                 Dyslipidemia. Breast cancer.  Sonographer:    Dustin Flock RDCS Referring Phys: [id number] [place]  1. Left ventricular ejection fraction, by estimation, is 60 to 65%. The left ventricle has normal function. The left ventricle has no regional wall motion abnormalities. There is concentric left ventricular hypertrophy. Left ventricular diastolic parameters are consistent with Grade I diastolic dysfunction (impaired relaxation).  2. Right ventricular systolic function is normal. The right ventricular size is normal. Tricuspid regurgitation signal is inadequate for assessing PA pressure.  3. Left atrial size was mildly dilated.  4. The mitral valve is degenerative. Mild mitral valve regurgitation.  5. Mild to moderate aortic stenosis is present. The aortic valve is severely calcified with restricted leaflet motion. SV = 46 cc. SVi = 28 cc/m2. The mean gradient is 10 mmHG, but this is likely underestimated due to the low SVi. The aortic valve has an indeterminant number of cusps. Aortic valve regurgitation is trivial . Aortic valve mean gradient measures 10.0 mmHg. Aortic valve Vmax measures 2.10 m/s.  6. The inferior vena cava is normal in size with greater than 50% respiratory variability, suggesting right atrial pressure of 3 mmHg. Comparison(s): A prior study was performed on 09/21/2018. No significant change from prior study. FINDINGS  Left Ventricle: Left ventricular  ejection fraction, by estimation, is 60 to 65%. The left ventricle has normal function. The left ventricle has no regional wall motion abnormalities. There is borderline concentric left ventricular hypertrophy. Left ventricular diastolic parameters are consistent with Grade I diastolic dysfunction (impaired relaxation). Right Ventricle: The right ventricular size is normal. No increase in right ventricular wall thickness. Right ventricular systolic function is normal. Tricuspid regurgitation signal is inadequate for assessing PA pressure. Left Atrium: Left atrial size was mildly dilated. Right Atrium: Right atrial size was normal in size. Pericardium: Trivial pericardial effusion is present. Presence of pericardial fat pad. Mitral Valve: The mitral valve is degenerative in appearance. Mild to moderate mitral annular calcification. Mild mitral valve regurgitation. Tricuspid Valve: The tricuspid valve  is grossly normal. Tricuspid valve regurgitation is trivial. Aortic Valve: Mild to moderate aortic stenosis is present. The aortic valve is severely calcified with restricted leaflet motion. SV = 46 cc. SVi = 28 cc/m2. The mean gradient is 10 mmHG, but this is likely underestimated due to the low SVi. The aortic valve has an indeterminant number of cusps. . There is severe thickening and severe calcifcation of the aortic valve. Aortic valve regurgitation is trivial. There is severe thickening of the aortic valve. There is severe calcifcation of the aortic valve.  Aortic valve mean gradient measures 10.0 mmHg. Aortic valve peak gradient measures 17.6 mmHg. Aortic valve area, by VTI measures 0.90 cm. Pulmonic Valve: The pulmonic valve was grossly normal. Pulmonic valve regurgitation is not visualized. Aorta: The aortic root is normal in size and structure. Venous: The inferior vena cava is normal in size with greater than 50% respiratory variability, suggesting right atrial pressure of 3 mmHg. IAS/Shunts: No atrial level  shunt detected by color flow Doppler.  LEFT VENTRICLE PLAX 2D LVIDd:         4.20 cm  Diastology LVIDs:         2.53 cm  LV e' lateral:   6.20 cm/s LV PW:         1.25 cm  LV E/e' lateral: 11.7 LV IVS:        1.09 cm  LV e' medial:    4.13 cm/s LVOT diam:     2.00 cm  LV E/e' medial:  17.6 LV SV:         45.55 ml LV SV Index:   32.73 LVOT Area:     3.14 cm  RIGHT VENTRICLE RV Basal diam:  2.11 cm RV S prime:     7.07 cm/s TAPSE (M-mode): 1.7 cm LEFT ATRIUM             Index       RIGHT ATRIUM           Index LA diam:        3.20 cm 1.92 cm/m  RA Area:     11.80 cm LA Vol (A2C):   20.7 ml 12.42 ml/m RA Volume:   25.40 ml  15.24 ml/m LA Vol (A4C):   48.3 ml 28.97 ml/m LA Biplane Vol: 33.5 ml 20.09 ml/m  AORTIC VALVE AV Area (Vmax):    1.08 cm AV Area (Vmean):   0.98 cm AV Area (VTI):     0.90 cm AV Vmax:           209.50 cm/s AV Vmean:          153.000 cm/s AV VTI:            0.504 m AV Peak Grad:      17.6 mmHg AV Mean Grad:      10.0 mmHg LVOT Vmax:         71.70 cm/s LVOT Vmean:        47.600 cm/s LVOT VTI:          0.145 m LVOT/AV VTI ratio: 0.29  AORTA Ao Root diam: 2.40 cm MITRAL VALVE MV Area (PHT): 3.65 cm     SHUNTS MV Decel Time: 208 msec     Systemic VTI:  0.14 m MV E velocity: 72.80 cm/s   Systemic Diam: 2.00 cm MV A velocity: 129.00 cm/s MV E/A ratio:  0.56 Eleonore Chiquito MD Electronically signed by Eleonore Chiquito MD Signature Date/Time: 02/04/2020/5:32:20 PM    Final  CT HEAD CODE STROKE WO CONTRAST  Result Date: 02/03/2020 CLINICAL DATA:  Code stroke. Ataxia of beginning a acutely 1630 hours. Confusion. EXAM: CT HEAD WITHOUT CONTRAST TECHNIQUE: Contiguous axial images were obtained from the base of the skull through the vertex without intravenous contrast. COMPARISON:  None. FINDINGS: Brain: Generalized brain atrophy and chronic small-vessel disease of the cerebral hemispheric white matter. Old lacunar infarctions of the basal ganglia. Old small vessel infarction of the left cerebellum. No  sign of acute infarction, mass lesion, hemorrhage hydrocephalus or extra-axial collection. Vascular: There is atherosclerotic calcification of the major vessels at the base of the brain. Skull: Negative Sinuses/Orbits: Clear/normal Other: None ASPECTS ([place] Stroke Program Early CT Score) - Ganglionic level infarction (caudate, lentiform nuclei, internal capsule, insula, M1-M3 cortex): 7 - Supraganglionic infarction (M4-M6 cortex): 3 Total score (0-10 with 10 being normal): 10 IMPRESSION: 1. No acute CT finding. Atrophy and chronic small-vessel ischemic changes. 2. ASPECTS is 10. 3. These results were called by telephone at the time of interpretation on 02/03/2020 at 10:33 pm to provider JOSEPH ZAMMIT , who verbally acknowledged these results. Electronically Signed   By: Nelson Chimes M.D.   On: 02/03/2020 22:35     Labs:   Basic Metabolic Panel: Recent Labs  Lab 02/03/20 2234  NA 140  K 3.9  CL 104  CO2 24  GLUCOSE 115*  BUN 24*  CREATININE 1.26*  CALCIUM 8.7*   GFR Estimated Creatinine Clearance: 28.8 mL/min (A) (by C-G formula based on SCr of 1.26 mg/dL (H)). Liver Function Tests: Recent Labs  Lab 02/03/20 2234  AST 17  ALT 15  ALKPHOS 62  BILITOT 0.6  PROT 6.9  ALBUMIN 3.8   No results for input(s): LIPASE, AMYLASE in the last 168 hours. No results for input(s): AMMONIA in the last 168 hours. Coagulation profile Recent Labs  Lab 02/03/20 2234  INR 1.0    CBC: Recent Labs  Lab 02/03/20 2234  WBC 6.8  NEUTROABS 5.5  HGB 13.3  HCT 42.4  MCV 94.9  PLT 269   Cardiac Enzymes: No results for input(s): CKTOTAL, CKMB, CKMBINDEX, TROPONINI in the last 168 hours. BNP: Invalid input(s): POCBNP CBG: No results for input(s): GLUCAP in the last 168 hours. D-Dimer No results for input(s): DDIMER in the last 72 hours. Hgb A1c Recent Labs    02/04/20 0633  HGBA1C 5.6   Lipid Profile Recent Labs    02/04/20 0633  CHOL 141  HDL 48  LDLCALC 80  TRIG 63    CHOLHDL 2.9   Thyroid function studies No results for input(s): TSH, T4TOTAL, T3FREE, THYROIDAB in the last 72 hours.  Invalid input(s): FREET3 Anemia work up No results for input(s): VITAMINB12, FOLATE, FERRITIN, TIBC, IRON, RETICCTPCT in the last 72 hours. Microbiology Recent Results (from the past 240 hour(s))  Respiratory Panel by RT PCR (Flu A&B, Covid) - Nasopharyngeal Swab     Status: None   Collection Time: 02/03/20 11:26 PM   Specimen: Nasopharyngeal Swab  Result Value Ref Range Status   SARS Coronavirus 2 by RT PCR NEGATIVE NEGATIVE Final    Comment: (NOTE) SARS-CoV-2 target nucleic acids are NOT DETECTED. The SARS-CoV-2 RNA is generally detectable in upper respiratoy specimens during the acute phase of infection. The lowest concentration of SARS-CoV-2 viral copies this assay can detect is 131 copies/mL. A negative result does not preclude SARS-Cov-2 infection and should not be used as the sole basis for treatment or other patient management decisions. A negative result may  occur with  improper specimen collection/handling, submission of specimen other than nasopharyngeal swab, presence of viral mutation(s) within the areas targeted by this assay, and inadequate number of viral copies (<131 copies/mL). A negative result must be combined with clinical observations, patient history, and epidemiological information. The expected result is Negative. Fact Sheet for Patients:  PinkCheek.be Fact Sheet for Healthcare Providers:  GravelBags.it This test is not yet ap proved or cleared by the [place] FDA and  has been authorized for detection and/or diagnosis of SARS-CoV-2 by FDA under an Emergency Use Authorization (EUA). This EUA will remain  in effect (meaning this test can be used) for the duration of the COVID-19 declaration under Section 564(b)(1) of the Act, 21 U.S.C. section 360bbb-3(b)(1), unless the  authorization is terminated or revoked sooner.    Influenza A by PCR NEGATIVE NEGATIVE Final   Influenza B by PCR NEGATIVE NEGATIVE Final    Comment: (NOTE) The Xpert Xpress SARS-CoV-2/FLU/RSV assay is intended as an aid in  the diagnosis of influenza from Nasopharyngeal swab specimens and  should not be used as a sole basis for treatment. Nasal washings and  aspirates are unacceptable for Xpert Xpress SARS-CoV-2/FLU/RSV  testing. Fact Sheet for Patients: PinkCheek.be Fact Sheet for Healthcare Providers: GravelBags.it This test is not yet approved or cleared by the [place] FDA and  has been authorized for detection and/or diagnosis of SARS-CoV-2 by  FDA under an Emergency Use Authorization (EUA). This EUA will remain  in effect (meaning this test can be used) for the duration of the  Covid-19 declaration under Section 564(b)(1) of the Act, 21  U.S.C. section 360bbb-3(b)(1), unless the authorization is  terminated or revoked. Performed at [facility], [address] [place], [place] [address]      Discharge Instructions:   Discharge Instructions    Ambulatory referral to Neurology   Complete by: As directed    An appointment is requested in approximately: 6 weeks   Diet - low sodium heart healthy   Complete by: As directed    Increase activity slowly   Complete by: As directed      Allergies as of 02/06/2020      Reactions   Lipitor [atorvastatin] Other (See Comments)   Dizzy and make her head feel huge       Medication List    STOP taking these medications   clopidogrel 75 MG tablet Commonly known as: PLAVIX     TAKE these medications   acetaminophen 500 MG tablet Commonly known as: TYLENOL Take 500 mg by mouth as needed. For pain when needed.   apixaban 5 MG Tabs tablet Commonly known as: ELIQUIS Take 1 tablet (5 mg total) by mouth 2 (two) times daily.   aspirin 81 MG EC tablet Take 1 tablet  (81 mg total) by mouth daily.   docusate sodium 100 MG capsule Commonly known as: COLACE Take 100 mg by mouth every evening.   EMERGEN-C IMMUNE PLUS PO Take 1 packet by mouth every evening.   pantoprazole 40 MG tablet Commonly known as: PROTONIX Take 1 tablet (40 mg total) by mouth 2 (two) times daily before a meal.   pravastatin 40 MG tablet Commonly known as: PRAVACHOL Take 40 mg by mouth daily.      Follow-up Information    [facility] Follow up in 1 week(s).   Specialty: Family Medicine Contact information: [address] [place]  [id number] [phone]  Time coordinating discharge: 35 min  Signed:  Geradine Girt DO  [facility] 02/06/2020, 1:33 PM

## 2020-02-06 NOTE — Evaluation (Signed)
Speech Language Pathology Evaluation Patient Details Name: Leslie Carr MRN: [id number] DOB: 1931/07/05 Today's Date: 02/06/2020 Time: 0912-0926 SLP Time Calculation (min) (ACUTE ONLY): 14 min  Problem List:  Patient Active Problem List   Diagnosis Date Noted  . TIA (transient ischemic attack) 02/04/2020  . Acute metabolic encephalopathy [id number]  . Esophagitis   . Acute blood loss anemia   . Symptomatic anemia 04/19/2019  . AAA (abdominal aortic aneurysm) ([place]) 04/19/2019  . CKD (chronic kidney disease), stage III 04/19/2019  . Gastrointestinal hemorrhage   . NSTEMI (non-ST elevated myocardial infarction) ([place]) 09/21/2018  . Abnormal nuclear stress test   . Unstable angina ([place]) 09/19/2018  . Labile hypertension 09/19/2018  . Ascending aortic aneurysm ([place]) 09/19/2018  . Chronic diastolic (congestive) heart failure ([place]) 09/19/2018  . Coronary artery disease involving native heart without angina pectoris 09/19/2018  . AKI (acute kidney injury) ([place]) 09/19/2018  . CVA (cerebral vascular accident) ([place]) 04/22/2016  . PAF (paroxysmal atrial fibrillation) ([place]); CHA2DS2-VASc Score = 7.  Patient declines DOAC 04/22/2016  . Mild aortic stenosis by prior echocardiogram 04/22/2016  . Recurrent pleural effusion on left 04/17/2016  . Recurrent left pleural effusion 04/09/2016  . Lung nodule, solitary 04/09/2016  . Atelectasis 04/09/2016  . Status post CVA 04/09/2016  . Hyperlipidemia 04/09/2016  . Heart murmur 04/09/2016  . History of breast cancer 04/09/2016   Past Medical History:  Past Medical History:  Diagnosis Date  . Acute metabolic encephalopathy [id number]  . Breast cancer ([place])   . Coronary artery disease due to calcified coronary lesion 09/2018   ACS PCI of OM2 on 09/21/2018 followed by staged orbital atherectomy based PCI of ostial to distal RCA.  (Details in Surgical History)  . Diastolic dysfunction with chronic heart failure ([place])   . History of bronchitis   .  Hypercholesteremia   . Mass of middle lobe of right lung   . Mild aortic stenosis by prior echocardiogram 09/2018   Mean gradient 14 million mercury, peak 24 mmHg  . Numbness and tingling in left hand   . Pleural effusion on left   . Shortness of breath dyspnea    "only going up a flight of stairs"  . Stroke [facility]) 2014   left sided weakness - mainly left hand   Past Surgical History:  Past Surgical History:  Procedure Laterality Date  . ABDOMINAL HYSTERECTOMY  1976  . CATARACT EXTRACTION Right   . COLONOSCOPY    . CORONARY ATHERECTOMY N/A 09/24/2018   Procedure: [place] CORONARY STENT PLACEMENT;  Surgeon: Leonie Man, MD;  Location: [place] [facility];; staged PCI: Ostial RCA 70%, prox-mid RCA 70% and mid RCA 95 and 50%. ->  Orbital atherectomy followed by 3 overlapping DES stents (proximal to distal): Xience Sierra 2.75 mm a 28 mm (2.9 mm), 2.5 mm x 33 mm (2.8 mm), 2.25 mm a 12 mm (tapered from 2.6 to 2.4 mm)  . CORONARY STENT INTERVENTION N/A 09/21/2018   Procedure: CORONARY STENT INTERVENTION;  Surgeon: Leonie Man, MD;  Location: [place] [facility];  Service: Cardiovascular;; Ostial OM2 85% (DES PCI, Xience Sierra 2.5 mm x 18 mm-minimal residual stenosis)  . DECORTICATION Left 04/17/2016   Procedure: Visceral Pleural DECORTICATION;  Surgeon: Melrose Nakayama, MD;  Location: [place];  Service: Thoracic;  Laterality: Left;  . ESOPHAGOGASTRODUODENOSCOPY N/A 04/20/2019   Procedure: ESOPHAGOGASTRODUODENOSCOPY (EGD);  Surgeon: Rogene Houston, MD;  [facility];  Service: Endoscopy;  Laterality: N/A;  APC- circumferential  .  FOOT SURGERY Right    "bone spur removal"  . LEFT HEART CATH AND CORONARY ANGIOGRAPHY N/A 09/21/2018   Procedure: LEFT HEART CATH AND CORONARY ANGIOGRAPHY;  Surgeon: Leonie Man, MD;  Location: [place] [facility];  Service: Cardiovascular;; Ostial OM2 85% (DES PCI), dLCx 45%.  Lesion #2-3: Ostial RCA 70%, prox-mid RCA 70% and  mid RCA 95 and 50%.  Distal RCA 30%, ostial RPDA 30%.  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 MASTECTOMY Left 1985  . PLEURAL BIOPSY Left 04/17/2016   Procedure:  Left PLEURAL BIOPSY;  Surgeon: Melrose Nakayama, MD;  Location: [place];  Service: Thoracic;  Laterality: Left;  . PLEURAL EFFUSION DRAINAGE Left 04/17/2016   Procedure: DRAINAGE OF Left PLEURAL EFFUSION;  Surgeon: Melrose Nakayama, MD;  Location: [place];  Service: Thoracic;  Laterality: Left;  . TONSILLECTOMY    . TRANSTHORACIC ECHOCARDIOGRAM  09/21/2018    EF 60 to 65%.  GR 1 DD.  Mild aortic s stenosis (mean gradient 14 mmHg, peak gradient 24 mmHg).  Mild LA dilation.  Lipomatous hypertrophy of intra-atrial septum.  Mar[place] VIDEO ASSISTED THORACOSCOPY Left 04/17/2016   Procedure:  Left VIDEO ASSISTED THORACOSCOPY with Drainage of Pleural Effusion, Pleural Biopsy, Visceral Pleural Decortication and Placement of On-Q pain pump;  Surgeon: Melrose Nakayama, MD;  Location: [place];  Service: Thoracic;  Laterality: Left;   HPI:  86 y.o. female with PMHx including breast cancer, CAD, hypercholesterolemia, prior stroke with mild residual left hand weakness, [facility] ED with c/o confusion and difficulty with word finding. CT brain with no acute changes, CT head and neck with Atherosclerotic disease at both carotid bifurcations. MRI terminated early by pt but noted grossly without changes compared to CT imaging performed. Admitted for stroke/TIA versus vertebrobasilar insufficiency.   Assessment / Plan / Recommendation Clinical Impression   Patient encountered in bed for speech/language evaluation s/p suspected TIA (workup continues). Patient presents with suspected baseline cognitive deficits (based on chart review: prior stroke, dementia) and no acute changes with speech/language function. Patient oriented x4. Patient conversational speech noted to be tangential, but patient able to be re-directed to tasks.  Pt able to answer basic and complex yes/no questions, follow single and multistep verbal commands and engage in automatic speech and repetition tasks without difficulty. No noted dysarthria. Portions of the COGNISTAT administered to aid in this assessment, all collected scores WFL. See below for score details. No inpatient ST indicated at this time as patient does not present with any acute changes. Please re-consult should any new needs arise.  COGNISTAT, all WFL Orientation: 12/12 Attention: 8/8 Naming: 8/8 Memory: 10/12 Reasoning, judgment: 5/6     SLP Assessment  SLP Recommendation/Assessment: Patient does not need any further Speech Lanaguage Pathology Services SLP Visit Diagnosis: Aphasia (R47.01)    Follow Up Recommendations  None;24 hour supervision/assistance    Frequency and Duration           SLP Evaluation Cognition  Overall Cognitive Status: History of cognitive impairments - at baseline Arousal/Alertness: Awake/alert Orientation Level: Oriented X4 Attention: Sustained Sustained Attention: Appears intact Awareness: Appears intact       Comprehension  Auditory Comprehension Overall Auditory Comprehension: Appears within functional limits for tasks assessed Yes/No Questions: Within Functional Limits Commands: Within Functional Limits Reading Comprehension Reading Status: Not tested    Expression Expression Primary Mode of Expression: Verbal Verbal Expression Overall Verbal Expression: Appears within functional limits for tasks assessed Initiation: No impairment Automatic  Speech: Counting Repetition: No impairment Naming: No impairment Written Expression Dominant Hand: Right Written Expression: Not tested   Oral / Motor  Oral Motor/Sensory Function Overall Oral Motor/Sensory Function: Within functional limits Motor Speech Overall Motor Speech: Appears within functional limits for tasks assessed   GO                    Marina Goodell 02/06/2020, 9:31  AM  Marina Goodell, M.Ed., [facility] [phone]: Acute Rehab office [phone] - pager

## 2020-02-06 NOTE — Progress Notes (Signed)
Pt went down to CT.

## 2020-02-06 NOTE — Discharge Instructions (Signed)

## 2020-02-06 NOTE — Plan of Care (Signed)
Pt discharged before being seen. However, chart and notes reviewed. CT repeat showed no acute change. Agree with eliquis for stroke prevention. Has changed plavix to ASA 81. Continue pravastatin. She will follow up with [facility] as outpt.   Rosalin Hawking, MD PhD Stroke Neurology 02/06/2020 1:51 PM

## 2020-02-10 DIAGNOSIS — I480 Paroxysmal atrial fibrillation: Secondary | ICD-10-CM | POA: Diagnosis not present

## 2020-02-10 DIAGNOSIS — Z8673 Personal history of transient ischemic attack (TIA), and cerebral infarction without residual deficits: Secondary | ICD-10-CM | POA: Diagnosis not present

## 2020-02-10 DIAGNOSIS — M7502 Adhesive capsulitis of left shoulder: Secondary | ICD-10-CM | POA: Diagnosis not present

## 2020-02-10 DIAGNOSIS — R54 Age-related physical debility: Secondary | ICD-10-CM | POA: Diagnosis not present

## 2020-02-11 DIAGNOSIS — Z7901 Long term (current) use of anticoagulants: Secondary | ICD-10-CM | POA: Diagnosis not present

## 2020-02-11 DIAGNOSIS — I714 Abdominal aortic aneurysm, without rupture: Secondary | ICD-10-CM | POA: Diagnosis not present

## 2020-02-11 DIAGNOSIS — Z853 Personal history of malignant neoplasm of breast: Secondary | ICD-10-CM | POA: Diagnosis not present

## 2020-02-11 DIAGNOSIS — N183 Chronic kidney disease, stage 3 unspecified: Secondary | ICD-10-CM | POA: Diagnosis not present

## 2020-02-11 DIAGNOSIS — I70209 Unspecified atherosclerosis of native arteries of extremities, unspecified extremity: Secondary | ICD-10-CM | POA: Diagnosis not present

## 2020-02-11 DIAGNOSIS — I509 Heart failure, unspecified: Secondary | ICD-10-CM | POA: Diagnosis not present

## 2020-02-11 DIAGNOSIS — I2510 Atherosclerotic heart disease of native coronary artery without angina pectoris: Secondary | ICD-10-CM | POA: Diagnosis not present

## 2020-02-11 DIAGNOSIS — I69334 Monoplegia of upper limb following cerebral infarction affecting left non-dominant side: Secondary | ICD-10-CM | POA: Diagnosis not present

## 2020-02-11 DIAGNOSIS — R54 Age-related physical debility: Secondary | ICD-10-CM | POA: Diagnosis not present

## 2020-02-11 DIAGNOSIS — I480 Paroxysmal atrial fibrillation: Secondary | ICD-10-CM | POA: Diagnosis not present

## 2020-02-11 DIAGNOSIS — F329 Major depressive disorder, single episode, unspecified: Secondary | ICD-10-CM | POA: Diagnosis not present

## 2020-02-11 DIAGNOSIS — I130 Hypertensive heart and chronic kidney disease with heart failure and stage 1 through stage 4 chronic kidney disease, or unspecified chronic kidney disease: Secondary | ICD-10-CM | POA: Diagnosis not present

## 2020-02-11 DIAGNOSIS — I69015 Cognitive social or emotional deficit following nontraumatic subarachnoid hemorrhage: Secondary | ICD-10-CM | POA: Diagnosis not present

## 2020-02-11 DIAGNOSIS — Z87891 Personal history of nicotine dependence: Secondary | ICD-10-CM | POA: Diagnosis not present

## 2020-02-11 DIAGNOSIS — M7502 Adhesive capsulitis of left shoulder: Secondary | ICD-10-CM | POA: Diagnosis not present

## 2020-02-11 DIAGNOSIS — Z955 Presence of coronary angioplasty implant and graft: Secondary | ICD-10-CM | POA: Diagnosis not present

## 2020-02-14 DIAGNOSIS — R54 Age-related physical debility: Secondary | ICD-10-CM | POA: Diagnosis not present

## 2020-02-14 DIAGNOSIS — I130 Hypertensive heart and chronic kidney disease with heart failure and stage 1 through stage 4 chronic kidney disease, or unspecified chronic kidney disease: Secondary | ICD-10-CM | POA: Diagnosis not present

## 2020-02-14 DIAGNOSIS — M7502 Adhesive capsulitis of left shoulder: Secondary | ICD-10-CM | POA: Diagnosis not present

## 2020-02-14 DIAGNOSIS — I69015 Cognitive social or emotional deficit following nontraumatic subarachnoid hemorrhage: Secondary | ICD-10-CM | POA: Diagnosis not present

## 2020-02-14 DIAGNOSIS — I480 Paroxysmal atrial fibrillation: Secondary | ICD-10-CM | POA: Diagnosis not present

## 2020-02-14 DIAGNOSIS — I69334 Monoplegia of upper limb following cerebral infarction affecting left non-dominant side: Secondary | ICD-10-CM | POA: Diagnosis not present

## 2020-02-15 DIAGNOSIS — I130 Hypertensive heart and chronic kidney disease with heart failure and stage 1 through stage 4 chronic kidney disease, or unspecified chronic kidney disease: Secondary | ICD-10-CM | POA: Diagnosis not present

## 2020-02-15 DIAGNOSIS — I69334 Monoplegia of upper limb following cerebral infarction affecting left non-dominant side: Secondary | ICD-10-CM | POA: Diagnosis not present

## 2020-02-15 DIAGNOSIS — M7502 Adhesive capsulitis of left shoulder: Secondary | ICD-10-CM | POA: Diagnosis not present

## 2020-02-15 DIAGNOSIS — I480 Paroxysmal atrial fibrillation: Secondary | ICD-10-CM | POA: Diagnosis not present

## 2020-02-15 DIAGNOSIS — I69015 Cognitive social or emotional deficit following nontraumatic subarachnoid hemorrhage: Secondary | ICD-10-CM | POA: Diagnosis not present

## 2020-02-15 DIAGNOSIS — R54 Age-related physical debility: Secondary | ICD-10-CM | POA: Diagnosis not present

## 2020-02-17 DIAGNOSIS — R54 Age-related physical debility: Secondary | ICD-10-CM | POA: Diagnosis not present

## 2020-02-17 DIAGNOSIS — I69015 Cognitive social or emotional deficit following nontraumatic subarachnoid hemorrhage: Secondary | ICD-10-CM | POA: Diagnosis not present

## 2020-02-17 DIAGNOSIS — I69334 Monoplegia of upper limb following cerebral infarction affecting left non-dominant side: Secondary | ICD-10-CM | POA: Diagnosis not present

## 2020-02-17 DIAGNOSIS — M7502 Adhesive capsulitis of left shoulder: Secondary | ICD-10-CM | POA: Diagnosis not present

## 2020-02-17 DIAGNOSIS — I130 Hypertensive heart and chronic kidney disease with heart failure and stage 1 through stage 4 chronic kidney disease, or unspecified chronic kidney disease: Secondary | ICD-10-CM | POA: Diagnosis not present

## 2020-02-17 DIAGNOSIS — I480 Paroxysmal atrial fibrillation: Secondary | ICD-10-CM | POA: Diagnosis not present

## 2020-02-20 DIAGNOSIS — I130 Hypertensive heart and chronic kidney disease with heart failure and stage 1 through stage 4 chronic kidney disease, or unspecified chronic kidney disease: Secondary | ICD-10-CM | POA: Diagnosis not present

## 2020-02-20 DIAGNOSIS — R54 Age-related physical debility: Secondary | ICD-10-CM | POA: Diagnosis not present

## 2020-02-20 DIAGNOSIS — I480 Paroxysmal atrial fibrillation: Secondary | ICD-10-CM | POA: Diagnosis not present

## 2020-02-20 DIAGNOSIS — I69015 Cognitive social or emotional deficit following nontraumatic subarachnoid hemorrhage: Secondary | ICD-10-CM | POA: Diagnosis not present

## 2020-02-20 DIAGNOSIS — I69334 Monoplegia of upper limb following cerebral infarction affecting left non-dominant side: Secondary | ICD-10-CM | POA: Diagnosis not present

## 2020-02-20 DIAGNOSIS — M7502 Adhesive capsulitis of left shoulder: Secondary | ICD-10-CM | POA: Diagnosis not present

## 2020-02-22 DIAGNOSIS — M7502 Adhesive capsulitis of left shoulder: Secondary | ICD-10-CM | POA: Diagnosis not present

## 2020-02-22 DIAGNOSIS — I69015 Cognitive social or emotional deficit following nontraumatic subarachnoid hemorrhage: Secondary | ICD-10-CM | POA: Diagnosis not present

## 2020-02-22 DIAGNOSIS — I69334 Monoplegia of upper limb following cerebral infarction affecting left non-dominant side: Secondary | ICD-10-CM | POA: Diagnosis not present

## 2020-02-22 DIAGNOSIS — R54 Age-related physical debility: Secondary | ICD-10-CM | POA: Diagnosis not present

## 2020-02-22 DIAGNOSIS — I480 Paroxysmal atrial fibrillation: Secondary | ICD-10-CM | POA: Diagnosis not present

## 2020-02-22 DIAGNOSIS — I130 Hypertensive heart and chronic kidney disease with heart failure and stage 1 through stage 4 chronic kidney disease, or unspecified chronic kidney disease: Secondary | ICD-10-CM | POA: Diagnosis not present

## 2020-02-25 ENCOUNTER — Other Ambulatory Visit: Payer: Self-pay

## 2020-02-25 ENCOUNTER — Emergency Department (HOSPITAL_COMMUNITY)
Admission: EM | Admit: 2020-02-25 | Discharge: 2020-02-25 | Disposition: A | Discharge status: Home / Self Care | Payer: Medicare Other | Attending: Emergency Medicine | Admitting: Emergency Medicine

## 2020-02-25 ENCOUNTER — Emergency Department (HOSPITAL_COMMUNITY): Payer: Medicare Other

## 2020-02-25 ENCOUNTER — Encounter (HOSPITAL_COMMUNITY): Payer: Self-pay | Admitting: Emergency Medicine

## 2020-02-25 DIAGNOSIS — Y999 Unspecified external cause status: Secondary | ICD-10-CM | POA: Insufficient documentation

## 2020-02-25 DIAGNOSIS — S299XXA Unspecified injury of thorax, initial encounter: Secondary | ICD-10-CM | POA: Diagnosis not present

## 2020-02-25 DIAGNOSIS — Y929 Unspecified place or not applicable: Secondary | ICD-10-CM | POA: Diagnosis not present

## 2020-02-25 DIAGNOSIS — W19XXXA Unspecified fall, initial encounter: Secondary | ICD-10-CM | POA: Diagnosis not present

## 2020-02-25 DIAGNOSIS — M25531 Pain in right wrist: Secondary | ICD-10-CM | POA: Diagnosis not present

## 2020-02-25 DIAGNOSIS — G4489 Other headache syndrome: Secondary | ICD-10-CM | POA: Diagnosis not present

## 2020-02-25 DIAGNOSIS — W010XXA Fall on same level from slipping, tripping and stumbling without subsequent striking against object, initial encounter: Secondary | ICD-10-CM | POA: Diagnosis not present

## 2020-02-25 DIAGNOSIS — I480 Paroxysmal atrial fibrillation: Secondary | ICD-10-CM | POA: Diagnosis not present

## 2020-02-25 DIAGNOSIS — I69334 Monoplegia of upper limb following cerebral infarction affecting left non-dominant side: Secondary | ICD-10-CM | POA: Diagnosis not present

## 2020-02-25 DIAGNOSIS — S0003XA Contusion of scalp, initial encounter: Secondary | ICD-10-CM | POA: Diagnosis not present

## 2020-02-25 DIAGNOSIS — I509 Heart failure, unspecified: Secondary | ICD-10-CM | POA: Insufficient documentation

## 2020-02-25 DIAGNOSIS — R0902 Hypoxemia: Secondary | ICD-10-CM | POA: Diagnosis not present

## 2020-02-25 DIAGNOSIS — Z7901 Long term (current) use of anticoagulants: Secondary | ICD-10-CM | POA: Diagnosis not present

## 2020-02-25 DIAGNOSIS — S0990XA Unspecified injury of head, initial encounter: Secondary | ICD-10-CM | POA: Diagnosis present

## 2020-02-25 DIAGNOSIS — I130 Hypertensive heart and chronic kidney disease with heart failure and stage 1 through stage 4 chronic kidney disease, or unspecified chronic kidney disease: Secondary | ICD-10-CM | POA: Diagnosis not present

## 2020-02-25 DIAGNOSIS — M7502 Adhesive capsulitis of left shoulder: Secondary | ICD-10-CM | POA: Diagnosis not present

## 2020-02-25 DIAGNOSIS — Y9301 Activity, walking, marching and hiking: Secondary | ICD-10-CM | POA: Insufficient documentation

## 2020-02-25 DIAGNOSIS — I69015 Cognitive social or emotional deficit following nontraumatic subarachnoid hemorrhage: Secondary | ICD-10-CM | POA: Diagnosis not present

## 2020-02-25 DIAGNOSIS — R54 Age-related physical debility: Secondary | ICD-10-CM | POA: Diagnosis not present

## 2020-02-25 DIAGNOSIS — R102 Pelvic and perineal pain: Secondary | ICD-10-CM | POA: Diagnosis not present

## 2020-02-25 DIAGNOSIS — R0689 Other abnormalities of breathing: Secondary | ICD-10-CM | POA: Diagnosis not present

## 2020-02-25 LAB — COMPREHENSIVE METABOLIC PANEL
ALT: 19 U/L (ref 0–44)
AST: 26 U/L (ref 15–41)
Albumin: 3.9 g/dL (ref 3.5–5.0)
Alkaline Phosphatase: 69 U/L (ref 38–126)
Anion gap: 12 (ref 5–15)
BUN: 23 mg/dL (ref 8–23)
CO2: 26 mmol/L (ref 22–32)
Calcium: 9.2 mg/dL (ref 8.9–10.3)
Chloride: 104 mmol/L (ref 98–111)
Creatinine, Ser: 1.53 mg/dL — ABNORMAL HIGH (ref 0.44–1.00)
GFR calc Af Amer: 35 mL/min — ABNORMAL LOW (ref >60)
GFR calc non Af Amer: 30 mL/min — ABNORMAL LOW (ref >60)
Glucose, Bld: 120 mg/dL — ABNORMAL HIGH (ref 70–99)
Potassium: 3.9 mmol/L (ref 3.5–5.1)
Sodium: 142 mmol/L (ref 135–145)
Total Bilirubin: 0.7 mg/dL (ref 0.3–1.2)
Total Protein: 7.2 g/dL (ref 6.5–8.1)

## 2020-02-25 LAB — I-STAT CHEM 8, ED
BUN: 23 mg/dL (ref 8–23)
Calcium, Ion: 1.06 mmol/L — ABNORMAL LOW (ref 1.15–1.40)
Chloride: 106 mmol/L (ref 98–111)
Creatinine, Ser: 1.5 mg/dL — ABNORMAL HIGH (ref 0.44–1.00)
Glucose, Bld: 114 mg/dL — ABNORMAL HIGH (ref 70–99)
HCT: 40 % (ref 36.0–46.0)
Hemoglobin: 13.6 g/dL (ref 12.0–15.0)
Potassium: 3.8 mmol/L (ref 3.5–5.1)
Sodium: 142 mmol/L (ref 135–145)
TCO2: 25 mmol/L (ref 22–32)

## 2020-02-25 LAB — CBC
HCT: 43.4 % (ref 36.0–46.0)
Hemoglobin: 13.5 g/dL (ref 12.0–15.0)
MCH: 29.2 pg (ref 26.0–34.0)
MCHC: 31.1 g/dL (ref 30.0–36.0)
MCV: 93.9 fL (ref 80.0–100.0)
Platelets: 276 10*3/uL (ref 150–400)
RBC: 4.62 MIL/uL (ref 3.87–5.11)
RDW: 13.6 % (ref 11.5–15.5)
WBC: 8.4 10*3/uL (ref 4.0–10.5)
nRBC: 0 % (ref 0.0–0.2)

## 2020-02-25 LAB — URINALYSIS, ROUTINE W REFLEX MICROSCOPIC
Bacteria, UA: NONE SEEN
Bilirubin Urine: NEGATIVE
Glucose, UA: NEGATIVE mg/dL
Hgb urine dipstick: NEGATIVE
Ketones, ur: NEGATIVE mg/dL
Nitrite: NEGATIVE
Protein, ur: NEGATIVE mg/dL
Specific Gravity, Urine: 1.014 (ref 1.005–1.030)
pH: 6 (ref 5.0–8.0)

## 2020-02-25 NOTE — ED Notes (Signed)
Updated pt's brother, DIck. All questions answered

## 2020-02-25 NOTE — ED Notes (Signed)
Pt's xrays at bedside finished @ 2013, in CT @ 2014

## 2020-02-25 NOTE — ED Triage Notes (Signed)
Pt coming from home after going up the steps, missed a step, and fell. EMS found pt had ambulated and placed herself of the couch upon arrival. Pt alert and oriented. Hx of breast cancer, CHF, cardiac stent. Hematoma noted on top left portion of head. Pt on blood thinners ASA daily and Eliquis for a previous TIA that was dx 2 weeks ago

## 2020-02-27 ENCOUNTER — Encounter (HOSPITAL_COMMUNITY): Payer: Self-pay | Admitting: Internal Medicine

## 2020-02-27 DIAGNOSIS — R54 Age-related physical debility: Secondary | ICD-10-CM | POA: Diagnosis not present

## 2020-02-27 DIAGNOSIS — I69015 Cognitive social or emotional deficit following nontraumatic subarachnoid hemorrhage: Secondary | ICD-10-CM | POA: Diagnosis not present

## 2020-02-27 DIAGNOSIS — I69334 Monoplegia of upper limb following cerebral infarction affecting left non-dominant side: Secondary | ICD-10-CM | POA: Diagnosis not present

## 2020-02-27 DIAGNOSIS — I130 Hypertensive heart and chronic kidney disease with heart failure and stage 1 through stage 4 chronic kidney disease, or unspecified chronic kidney disease: Secondary | ICD-10-CM | POA: Diagnosis not present

## 2020-02-27 DIAGNOSIS — M7502 Adhesive capsulitis of left shoulder: Secondary | ICD-10-CM | POA: Diagnosis not present

## 2020-02-27 DIAGNOSIS — I480 Paroxysmal atrial fibrillation: Secondary | ICD-10-CM | POA: Diagnosis not present

## 2020-02-27 NOTE — ED Provider Notes (Signed)
[facility] EMERGENCY DEPARTMENT Provider Note   CSN: [id number] Arrival date & time: 02/25/20  1954     History No chief complaint on file.   Leslie Carr is a 86 y.o. female.  The history is provided by the patient and the EMS personnel.   The patient is an 85 year old female with a past medical history of TIA on Eliquis, CHF, who presents to the ED as a level 2 trauma for fall on blood thinner.  Patient had a mechanical fall, missed a step, struck the back of her head on the ground.  No loss of consciousness, no syncope, no nausea, vomiting.  She denies any neck pain, back pain, she does report some tenderness in her wrist.  Her symptoms are moderate, symptoms are constant, nothing makes it better, nothing makes it worse.    Past Medical History:  Diagnosis Date  . Breast cancer ([place])   . CHF (congestive heart failure) ([place])   . TIA (transient ischemic attack) 01/2020    There are no problems to display for this patient.   OB History   No obstetric history on file.     No family history on file.  Social History   Tobacco Use  . Smoking status: Not on file  Substance Use Topics  . Alcohol use: Not on file  . Drug use: Not on file    Home Medications Prior to Admission medications   Not on File    Allergies    Patient has no allergy information on record.  Review of Systems   Review of Systems  Constitutional: Negative for chills and fever.  Respiratory: Negative for cough and shortness of breath.   Cardiovascular: Negative for chest pain.  Gastrointestinal: Negative for abdominal pain, nausea and vomiting.  Musculoskeletal: Negative for back pain and neck pain.  Neurological: Positive for headaches. Negative for syncope and light-headedness.  All other systems reviewed and are negative.   Physical Exam Updated Vital Signs BP (!) 167/88   Pulse 95   Temp (!) 96.3 F (35.7 C) (Temporal)   Resp 14   Ht 5\' 3"  (1.6 m)   Wt 62.6  kg   SpO2 96%   BMI 24.45 kg/m   Physical Exam Vitals and nursing note reviewed.  Constitutional:      Appearance: She is well-developed. She is not toxic-appearing or diaphoretic.  HENT:     Head: Normocephalic.     Comments: Contusion of the posterior scalp without laceration.    Mouth/Throat:     Mouth: Mucous membranes are moist.     Pharynx: Oropharynx is clear.  Eyes:     Conjunctiva/sclera: Conjunctivae normal.     Pupils: Pupils are equal, round, and reactive to light.  Neck:     Comments: No C-spine tenderness, step-off or deformity. Cardiovascular:     Rate and Rhythm: Normal rate and regular rhythm.     Pulses: Normal pulses.     Heart sounds: No murmur.  Pulmonary:     Effort: Pulmonary effort is normal. No respiratory distress.     Breath sounds: Normal breath sounds.  Chest:     Chest wall: No tenderness.  Abdominal:     Palpations: Abdomen is soft.     Tenderness: There is no abdominal tenderness. There is no guarding or rebound.  Musculoskeletal:        General: Tenderness (right wrist, no snuffbox ttp, no other bony ttp) present. No deformity.     Cervical  back: Neck supple.  Skin:    General: Skin is warm and dry.  Neurological:     General: No focal deficit present.     Mental Status: She is alert and oriented to person, place, and time.     Motor: No weakness.     Gait: Gait normal.  Psychiatric:        Mood and Affect: Mood normal.        Behavior: Behavior normal.     ED Results / Procedures / Treatments   Labs (all labs ordered are listed, but only abnormal results are displayed) Labs Reviewed  COMPREHENSIVE METABOLIC PANEL - Abnormal; Notable for the following components:      Result Value   Glucose, Bld 120 (*)    Creatinine, Ser 1.53 (*)    GFR calc non Af Amer 30 (*)    GFR calc Af Amer 35 (*)    All other components within normal limits  URINALYSIS, ROUTINE W REFLEX MICROSCOPIC - Abnormal; Notable for the following components:    Leukocytes,Ua SMALL (*)    All other components within normal limits  I-STAT CHEM 8, ED - Abnormal; Notable for the following components:   Creatinine, Ser 1.50 (*)    Glucose, Bld 114 (*)    Calcium, Ion 1.06 (*)    All other components within normal limits  CBC    EKG None  Radiology DG Wrist Complete Right  Result Date: 02/25/2020 CLINICAL DATA:  Pain EXAM: RIGHT WRIST - COMPLETE 3+ VIEW COMPARISON:  None. FINDINGS: There is no acute displaced fracture or dislocation. There are advanced degenerative changes at the first [facility]. There is no significant surrounding soft tissue swelling. IMPRESSION: Negative. Electronically Signed   By: Constance Holster M.D.   On: 02/25/2020 20:31   CT HEAD WO CONTRAST  Result Date: 02/25/2020 CLINICAL DATA:  Left scalp hematoma following a fall. On Eliquis. EXAM: CT HEAD WITHOUT CONTRAST CT CERVICAL SPINE WITHOUT CONTRAST TECHNIQUE: Multidetector CT imaging of the head and cervical spine was performed following the standard protocol without intravenous contrast. Multiplanar CT image reconstructions of the cervical spine were also generated. COMPARISON:  02/06/2020 FINDINGS: CT HEAD FINDINGS Brain: Stable moderately enlarged ventricles and subarachnoid spaces. Stable moderate patchy white matter low density in both cerebral hemispheres. No intracranial hemorrhage, mass lesion or CT evidence of acute infarction. Vascular: No hyperdense vessel or unexpected calcification. Skull: Normal. Negative for fracture or focal lesion. Sinuses/Orbits: Status post right cataract extraction. Unremarkable paranasal sinuses. Other: Posterior scalp hematoma centered to the left of midline. CT CERVICAL SPINE FINDINGS Alignment: Mild anterolisthesis at the C3-4 level. Minimal retrolisthesis at the C4-5 level. Minimal anterolisthesis at the C7-T1 level. Skull base and vertebrae: No acute fracture. No primary bone lesion or focal pathologic process. Areas of scattered patchy low density  throughout the cervical spine compatible with patchy osteopenia. No definite lytic lesions are seen. Soft tissues and spinal canal: No prevertebral fluid or swelling. No visible canal hematoma. Disc levels: Multilevel degenerative changes, including facet degenerative changes. The facet degenerative changes are most pronounced at the C3-4 level. Upper chest: Biapical pleural and parenchymal scarring, greater on the left. Other: 1.4 cm densely calcified left lobe thyroid nodule. Multiple additional smaller bilateral thyroid nodules. Dense bilateral carotid artery calcifications. IMPRESSION: 1. Posterior scalp hematoma without skull fracture or intracranial hemorrhage. 2. No cervical spine fracture or traumatic subluxation. 3. Stable moderate diffuse cerebral and cerebellar atrophy. 4. Stable moderate chronic small vessel white matter ischemic changes in  both cerebral hemispheres. 5. Multilevel cervical spine degenerative changes. 6. Dense bilateral carotid artery atheromatous calcifications. 7. Multinodular thyroid gland with the largest measuring 1.4 cm in maximum diameter. No followup recommended (ref: J Am Coll Radiol. 2015 Feb;12(2): 143-50). Electronically Signed   By: Claudie Revering M.D.   On: 02/25/2020 20:51   CT CERVICAL SPINE WO CONTRAST  Result Date: 02/25/2020 CLINICAL DATA:  Left scalp hematoma following a fall. On Eliquis. EXAM: CT HEAD WITHOUT CONTRAST CT CERVICAL SPINE WITHOUT CONTRAST TECHNIQUE: Multidetector CT imaging of the head and cervical spine was performed following the standard protocol without intravenous contrast. Multiplanar CT image reconstructions of the cervical spine were also generated. COMPARISON:  02/06/2020 FINDINGS: CT HEAD FINDINGS Brain: Stable moderately enlarged ventricles and subarachnoid spaces. Stable moderate patchy white matter low density in both cerebral hemispheres. No intracranial hemorrhage, mass lesion or CT evidence of acute infarction. Vascular: No hyperdense  vessel or unexpected calcification. Skull: Normal. Negative for fracture or focal lesion. Sinuses/Orbits: Status post right cataract extraction. Unremarkable paranasal sinuses. Other: Posterior scalp hematoma centered to the left of midline. CT CERVICAL SPINE FINDINGS Alignment: Mild anterolisthesis at the C3-4 level. Minimal retrolisthesis at the C4-5 level. Minimal anterolisthesis at the C7-T1 level. Skull base and vertebrae: No acute fracture. No primary bone lesion or focal pathologic process. Areas of scattered patchy low density throughout the cervical spine compatible with patchy osteopenia. No definite lytic lesions are seen. Soft tissues and spinal canal: No prevertebral fluid or swelling. No visible canal hematoma. Disc levels: Multilevel degenerative changes, including facet degenerative changes. The facet degenerative changes are most pronounced at the C3-4 level. Upper chest: Biapical pleural and parenchymal scarring, greater on the left. Other: 1.4 cm densely calcified left lobe thyroid nodule. Multiple additional smaller bilateral thyroid nodules. Dense bilateral carotid artery calcifications. IMPRESSION: 1. Posterior scalp hematoma without skull fracture or intracranial hemorrhage. 2. No cervical spine fracture or traumatic subluxation. 3. Stable moderate diffuse cerebral and cerebellar atrophy. 4. Stable moderate chronic small vessel white matter ischemic changes in both cerebral hemispheres. 5. Multilevel cervical spine degenerative changes. 6. Dense bilateral carotid artery atheromatous calcifications. 7. Multinodular thyroid gland with the largest measuring 1.4 cm in maximum diameter. No followup recommended (ref: J Am Coll Radiol. 2015 Feb;12(2): 143-50). Electronically Signed   By: Claudie Revering M.D.   On: 02/25/2020 20:51   DG Pelvis Portable  Result Date: 02/25/2020 CLINICAL DATA:  Pain EXAM: PORTABLE PELVIS 1-2 VIEWS COMPARISON:  None. FINDINGS: Mild-to-moderate bilateral hip  osteoarthritis is noted. There is no acute displaced fracture. No dislocation. Phleboliths project over the patient's pelvis. Vascular calcifications are noted. There are advanced degenerative changes at the L4-L5 and L5-S1 levels. IMPRESSION: No acute osseous abnormality. Electronically Signed   By: Constance Holster M.D.   On: 02/25/2020 20:28   DG Chest Port 1 View  Result Date: 02/25/2020 CLINICAL DATA:  Acute pain due to trauma EXAM: PORTABLE CHEST 1 VIEW COMPARISON:  02/04/2020 FINDINGS: Again noted is a right middle lobe mass, stable from prior study. Heart size is stable. The thoracic aorta is heavily calcified and tortuous. Old healed left-sided rib fractures are noted. There are end-stage degenerative changes of the left glenohumeral joint. There is no pneumothorax. No large pleural effusion. There is stable blunting of the left costophrenic angle. IMPRESSION: No acute cardiopulmonary process. Chronic findings as detailed above. Electronically Signed   By: Constance Holster M.D.   On: 02/25/2020 20:30    Procedures Procedures (including critical care time)  Medications Ordered in ED Medications - No data to display  ED Course  I have reviewed the triage vital signs and the nursing notes.  Pertinent labs & imaging results that were available during my care of the patient were reviewed by me and considered in my medical decision making (see chart for details).    MDM Rules/Calculators/A&P                     Here after a mechanical fall, level 2 trauma because she was on blood thinners and hit her head.  Imaging without emergent changes, no emergent changes on labs, patient resting comfortably on recheck, stable for discharge at this time.  Strict return precautions provided, patient discharged stable condition.  Will follow up with a primary care provider.  Final Clinical Impression(s) / ED Diagnoses Final diagnoses:  Fall  Fall    Rx / DC Orders ED Discharge Orders    None         Gery Sabedra, [place], MD 02/27/20 [id number]    Elnora Morrison, MD 02/28/20 [phone]

## 2020-02-29 DIAGNOSIS — I480 Paroxysmal atrial fibrillation: Secondary | ICD-10-CM | POA: Diagnosis not present

## 2020-02-29 DIAGNOSIS — M7502 Adhesive capsulitis of left shoulder: Secondary | ICD-10-CM | POA: Diagnosis not present

## 2020-02-29 DIAGNOSIS — I69334 Monoplegia of upper limb following cerebral infarction affecting left non-dominant side: Secondary | ICD-10-CM | POA: Diagnosis not present

## 2020-02-29 DIAGNOSIS — R54 Age-related physical debility: Secondary | ICD-10-CM | POA: Diagnosis not present

## 2020-02-29 DIAGNOSIS — I69015 Cognitive social or emotional deficit following nontraumatic subarachnoid hemorrhage: Secondary | ICD-10-CM | POA: Diagnosis not present

## 2020-02-29 DIAGNOSIS — I130 Hypertensive heart and chronic kidney disease with heart failure and stage 1 through stage 4 chronic kidney disease, or unspecified chronic kidney disease: Secondary | ICD-10-CM | POA: Diagnosis not present

## 2020-03-02 DIAGNOSIS — R54 Age-related physical debility: Secondary | ICD-10-CM | POA: Diagnosis not present

## 2020-03-02 DIAGNOSIS — I480 Paroxysmal atrial fibrillation: Secondary | ICD-10-CM | POA: Diagnosis not present

## 2020-03-02 DIAGNOSIS — I130 Hypertensive heart and chronic kidney disease with heart failure and stage 1 through stage 4 chronic kidney disease, or unspecified chronic kidney disease: Secondary | ICD-10-CM | POA: Diagnosis not present

## 2020-03-02 DIAGNOSIS — I69334 Monoplegia of upper limb following cerebral infarction affecting left non-dominant side: Secondary | ICD-10-CM | POA: Diagnosis not present

## 2020-03-02 DIAGNOSIS — I69015 Cognitive social or emotional deficit following nontraumatic subarachnoid hemorrhage: Secondary | ICD-10-CM | POA: Diagnosis not present

## 2020-03-02 DIAGNOSIS — M7502 Adhesive capsulitis of left shoulder: Secondary | ICD-10-CM | POA: Diagnosis not present

## 2020-03-06 DIAGNOSIS — I130 Hypertensive heart and chronic kidney disease with heart failure and stage 1 through stage 4 chronic kidney disease, or unspecified chronic kidney disease: Secondary | ICD-10-CM | POA: Diagnosis not present

## 2020-03-06 DIAGNOSIS — I69015 Cognitive social or emotional deficit following nontraumatic subarachnoid hemorrhage: Secondary | ICD-10-CM | POA: Diagnosis not present

## 2020-03-06 DIAGNOSIS — I480 Paroxysmal atrial fibrillation: Secondary | ICD-10-CM | POA: Diagnosis not present

## 2020-03-06 DIAGNOSIS — M7502 Adhesive capsulitis of left shoulder: Secondary | ICD-10-CM | POA: Diagnosis not present

## 2020-03-06 DIAGNOSIS — R54 Age-related physical debility: Secondary | ICD-10-CM | POA: Diagnosis not present

## 2020-03-06 DIAGNOSIS — I69334 Monoplegia of upper limb following cerebral infarction affecting left non-dominant side: Secondary | ICD-10-CM | POA: Diagnosis not present

## 2020-03-08 DIAGNOSIS — I69015 Cognitive social or emotional deficit following nontraumatic subarachnoid hemorrhage: Secondary | ICD-10-CM | POA: Diagnosis not present

## 2020-03-08 DIAGNOSIS — I69334 Monoplegia of upper limb following cerebral infarction affecting left non-dominant side: Secondary | ICD-10-CM | POA: Diagnosis not present

## 2020-03-08 DIAGNOSIS — I480 Paroxysmal atrial fibrillation: Secondary | ICD-10-CM | POA: Diagnosis not present

## 2020-03-08 DIAGNOSIS — I130 Hypertensive heart and chronic kidney disease with heart failure and stage 1 through stage 4 chronic kidney disease, or unspecified chronic kidney disease: Secondary | ICD-10-CM | POA: Diagnosis not present

## 2020-03-08 DIAGNOSIS — R54 Age-related physical debility: Secondary | ICD-10-CM | POA: Diagnosis not present

## 2020-03-08 DIAGNOSIS — M7502 Adhesive capsulitis of left shoulder: Secondary | ICD-10-CM | POA: Diagnosis not present

## 2020-03-12 DIAGNOSIS — N183 Chronic kidney disease, stage 3 unspecified: Secondary | ICD-10-CM | POA: Diagnosis not present

## 2020-03-12 DIAGNOSIS — M7502 Adhesive capsulitis of left shoulder: Secondary | ICD-10-CM | POA: Diagnosis not present

## 2020-03-12 DIAGNOSIS — I480 Paroxysmal atrial fibrillation: Secondary | ICD-10-CM | POA: Diagnosis not present

## 2020-03-12 DIAGNOSIS — Z87891 Personal history of nicotine dependence: Secondary | ICD-10-CM | POA: Diagnosis not present

## 2020-03-12 DIAGNOSIS — R54 Age-related physical debility: Secondary | ICD-10-CM | POA: Diagnosis not present

## 2020-03-12 DIAGNOSIS — I69334 Monoplegia of upper limb following cerebral infarction affecting left non-dominant side: Secondary | ICD-10-CM | POA: Diagnosis not present

## 2020-03-12 DIAGNOSIS — Z853 Personal history of malignant neoplasm of breast: Secondary | ICD-10-CM | POA: Diagnosis not present

## 2020-03-12 DIAGNOSIS — I70209 Unspecified atherosclerosis of native arteries of extremities, unspecified extremity: Secondary | ICD-10-CM | POA: Diagnosis not present

## 2020-03-12 DIAGNOSIS — Z955 Presence of coronary angioplasty implant and graft: Secondary | ICD-10-CM | POA: Diagnosis not present

## 2020-03-12 DIAGNOSIS — Z7901 Long term (current) use of anticoagulants: Secondary | ICD-10-CM | POA: Diagnosis not present

## 2020-03-12 DIAGNOSIS — F329 Major depressive disorder, single episode, unspecified: Secondary | ICD-10-CM | POA: Diagnosis not present

## 2020-03-12 DIAGNOSIS — I130 Hypertensive heart and chronic kidney disease with heart failure and stage 1 through stage 4 chronic kidney disease, or unspecified chronic kidney disease: Secondary | ICD-10-CM | POA: Diagnosis not present

## 2020-03-12 DIAGNOSIS — I69015 Cognitive social or emotional deficit following nontraumatic subarachnoid hemorrhage: Secondary | ICD-10-CM | POA: Diagnosis not present

## 2020-03-12 DIAGNOSIS — I714 Abdominal aortic aneurysm, without rupture: Secondary | ICD-10-CM | POA: Diagnosis not present

## 2020-03-12 DIAGNOSIS — I2510 Atherosclerotic heart disease of native coronary artery without angina pectoris: Secondary | ICD-10-CM | POA: Diagnosis not present

## 2020-03-12 DIAGNOSIS — I509 Heart failure, unspecified: Secondary | ICD-10-CM | POA: Diagnosis not present

## 2020-03-13 DIAGNOSIS — I69334 Monoplegia of upper limb following cerebral infarction affecting left non-dominant side: Secondary | ICD-10-CM | POA: Diagnosis not present

## 2020-03-13 DIAGNOSIS — I130 Hypertensive heart and chronic kidney disease with heart failure and stage 1 through stage 4 chronic kidney disease, or unspecified chronic kidney disease: Secondary | ICD-10-CM | POA: Diagnosis not present

## 2020-03-13 DIAGNOSIS — M7502 Adhesive capsulitis of left shoulder: Secondary | ICD-10-CM | POA: Diagnosis not present

## 2020-03-13 DIAGNOSIS — R54 Age-related physical debility: Secondary | ICD-10-CM | POA: Diagnosis not present

## 2020-03-13 DIAGNOSIS — I480 Paroxysmal atrial fibrillation: Secondary | ICD-10-CM | POA: Diagnosis not present

## 2020-03-13 DIAGNOSIS — I69015 Cognitive social or emotional deficit following nontraumatic subarachnoid hemorrhage: Secondary | ICD-10-CM | POA: Diagnosis not present

## 2020-03-14 ENCOUNTER — Encounter: Payer: Self-pay | Admitting: Diagnostic Neuroimaging

## 2020-03-14 ENCOUNTER — Other Ambulatory Visit: Payer: Self-pay

## 2020-03-14 ENCOUNTER — Ambulatory Visit (INDEPENDENT_AMBULATORY_CARE_PROVIDER_SITE_OTHER): Payer: Medicare Other | Admitting: Diagnostic Neuroimaging

## 2020-03-14 VITALS — BP 116/67 | HR 60 | Temp 97.5°F | Ht 63.0 in | Wt 139.0 lb

## 2020-03-14 DIAGNOSIS — I2510 Atherosclerotic heart disease of native coronary artery without angina pectoris: Secondary | ICD-10-CM | POA: Diagnosis not present

## 2020-03-14 DIAGNOSIS — G459 Transient cerebral ischemic attack, unspecified: Secondary | ICD-10-CM | POA: Diagnosis not present

## 2020-03-14 NOTE — Progress Notes (Signed)
[facility]  PATIENT: [place] Carr DOB: 1931-06-13  REFERRING CLINICIAN: Geradine Girt, DO HISTORY FROM: patient  REASON FOR VISIT: new consult    HISTORICAL  CHIEF COMPLAINT:  Chief Complaint  Patient presents with   Transient Ischemic Attack    rm 6 New Pt, dgtr- Santiago Glad     HISTORY OF PRESENT ILLNESS:    86 year old female here for evaluation of TIA.  History of hypertension, hyperlipidemia, atrial fibrillation, presented to hospital February 2021 with aphasia and dizziness.  Symptoms lasted 2 to 3 hours and then resolved.  Patient admitted to the hospital for stroke work-up.  Patient had been on Plavix when this happened.  She was started on anticoagulation with Eliquis.  Plavix was changed to aspirin 81 mg daily, presumably for diffuse severe atherosclerosis.  Since that time no residual or recurrent events.  Patient did have a fall in March 2021, accidental, went to the emergency room for evaluation.  She had significant bruising and ecchymosis in her neck and legs.  CT of the head showed no acute findings.   REVIEW OF SYSTEMS: Full 14 system review of systems performed and negative with exception of: As per HPI.  ALLERGIES: Allergies  Allergen Reactions   Lipitor [Atorvastatin] Other (See Comments)    Dizzy and make her head feel huge     HOME MEDICATIONS: Outpatient Medications Prior to Visit  Medication Sig Dispense Refill   acetaminophen (TYLENOL) 500 MG tablet Take 500 mg by mouth as needed. For pain when needed.     apixaban (ELIQUIS) 5 MG TABS tablet Take 1 tablet (5 mg total) by mouth 2 (two) times daily. 60 tablet 0   aspirin EC 81 MG EC tablet Take 1 tablet (81 mg total) by mouth daily.     docusate sodium (COLACE) 100 MG capsule Take 100 mg by mouth every evening.     Multiple Vitamins-Minerals (EMERGEN-C IMMUNE PLUS PO) Take 1 packet by mouth every evening.      pravastatin (PRAVACHOL) 40 MG tablet Take 40 mg by mouth  daily.     pantoprazole (PROTONIX) 40 MG tablet Take 1 tablet (40 mg total) by mouth 2 (two) times daily before a meal. 60 tablet 1   No facility-administered medications prior to visit.    PAST MEDICAL HISTORY: Past Medical History:  Diagnosis Date   Acute metabolic encephalopathy [id number]   Breast cancer ([place])    CHF (congestive heart failure) ([place])    Coronary artery disease due to calcified coronary lesion 09/2018   ACS PCI of OM2 on 09/21/2018 followed by staged orbital atherectomy based PCI of ostial to distal RCA.  (Details in Surgical History)   Diastolic dysfunction with chronic heart failure (HCC)    History of bronchitis    Hypercholesteremia    Mass of middle lobe of right lung    Mild aortic stenosis by prior echocardiogram 09/2018   Mean gradient 14 million mercury, peak 24 mmHg   Numbness and tingling in left hand    Pleural effusion on left    Shortness of breath dyspnea    "only going up a flight of stairs"   Stroke [facility]) 2014   left sided weakness - mainly left hand   TIA (transient ischemic attack) 01/2020    PAST SURGICAL HISTORY: Past Surgical History:  Procedure Laterality Date   ABDOMINAL HYSTERECTOMY  1976   CATARACT EXTRACTION Right    COLONOSCOPY     CORONARY ANGIOPLASTY WITH STENT PLACEMENT  CORONARY ATHERECTOMY N/A 09/24/2018   Procedure: [place] CORONARY STENT PLACEMENT;  Surgeon: Leonie Man, MD;  Location: [facility];; staged PCI: Ostial RCA 70%, prox-mid RCA 70% and mid RCA 95 and 50%. ->  Orbital atherectomy followed by 3 overlapping DES stents (proximal to distal): Xience Sierra 2.75 mm a 28 mm (2.9 mm), 2.5 mm x 33 mm (2.8 mm), 2.25 mm a 12 mm (tapered from 2.6 to 2.4 mm)   CORONARY STENT INTERVENTION N/A 09/21/2018   Procedure: CORONARY STENT INTERVENTION;  Surgeon: Leonie Man, MD;  Location: [facility] [place] [facility];  Service: Cardiovascular;; Ostial OM2 85% (DES PCI, Xience Sierra 2.5 mm  x 18 mm-minimal residual stenosis)   DECORTICATION Left 04/17/2016   Procedure: Visceral Pleural DECORTICATION;  Surgeon: Melrose Nakayama, MD;  Location: [facility];  Service: Thoracic;  Laterality: Left;   ESOPHAGOGASTRODUODENOSCOPY N/A 04/20/2019   Procedure: ESOPHAGOGASTRODUODENOSCOPY (EGD);  Surgeon: Rogene Houston, MD;  [facility];  Service: Endoscopy;  Laterality: N/A;  APC- circumferential   FOOT SURGERY Right    "bone spur removal"   LEFT HEART CATH AND CORONARY ANGIOGRAPHY N/A 09/21/2018   Procedure: LEFT HEART CATH AND CORONARY ANGIOGRAPHY;  Surgeon: Leonie Man, MD;  Location: [facility];  Service: Cardiovascular;; Ostial OM2 85% (DES PCI), dLCx 45%.  Lesion #2-3: Ostial RCA 70%, prox-mid RCA 70% and mid RCA 95 and 50%.  Distal RCA 30%, ostial RPDA 30%.  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  GR 1 DD.  Mild aortic s stenosis (mean gradient 14 mmHg, peak gradient 24 mmHg).  Mild LA dilation.  Lipomatous hypertrophy of intra-atrial septum.   VIDEO ASSISTED THORACOSCOPY Left 04/17/2016   Procedure:  Left VIDEO ASSISTED THORACOSCOPY with Drainage of Pleural Effusion, Pleural Biopsy, Visceral Pleural Decortication and Placement of On-Q pain pump;  Surgeon: Melrose Nakayama, MD;  Location: [facility];  Service: Thoracic;  Laterality: Left;    FAMILY HISTORY: Family History  Problem Relation Age of Onset   Hypertension Mother    Hypertension Father    Heart disease Neg Hx    Heart attack Neg  Hx    Hyperlipidemia Neg Hx    Heart failure Neg Hx     SOCIAL HISTORY: Social History   Socioeconomic History   Marital status: Divorced    Spouse name: Not on file   Number of children: 3   Years of education: 12   Highest education level: Not on file  Occupational History    Comment: retired  Tobacco Use   Smoking status: Former Smoker   Smokeless tobacco: Never Used   Tobacco comment: Quit over 40 years ago  Substance and Sexual Activity   Alcohol use: No    Alcohol/week: 0.0 standard drinks    Comment: occas   Drug use: No   Sexual activity: Not on file  Other Topics Concern   Not on file  Social History Narrative   03/14/20  Lives below daughter, down  stairs apt       Social Determinants of Health   Financial Resource Strain:    Difficulty of Paying Living Expenses:   Food Insecurity:    Worried About Charity fundraiser in the Last Year:    Arboriculturist in the Last Year:   Transportation Needs:    Film/video editor (Medical):    Lack of Transportation (Non-Medical):   Physical Activity:    Days of Exercise per Week:    Minutes of Exercise per Session:   Stress:    Feeling of Stress :   Social Connections:    Frequency of Communication with Friends and Family:    Frequency of Social Gatherings with Friends and Family:    Attends Religious Services:    Active Member of Clubs or Organizations:    Attends Music therapist:    Marital Status:   Intimate Partner Violence:    Fear of Current or Ex-Partner:    Emotionally Abused:    Physically Abused:    Sexually Abused:      PHYSICAL EXAM  GENERAL EXAM/CONSTITUTIONAL: Vitals:  Vitals:   03/14/20 0944  BP: 116/67  Pulse: 60  Temp: (!) 97.5 F (36.4 C)  Weight: 139 lb (63 kg)  Height: 5\' 3"  (1.6 m)     Body mass index is 24.62 kg/m. Wt Readings from Last 3 Encounters:  03/14/20 139 lb (63 kg)  02/25/20 138 lb (62.6 kg)  02/03/20 141 lb  1.5 oz (64 kg)     Patient is in no distress; well developed, nourished and groomed; neck is supple  CARDIOVASCULAR:  Examination of carotid arteries is normal; no carotid bruits  Regular rate and rhythm, no murmurs  Examination of peripheral vascular system by observation and palpation is normal  EXCEPT DECR PULSE IN LEFT RADIAL ARTERY  EYES:  Ophthalmoscopic exam of optic discs and posterior segments is normal; no papilledema or hemorrhages  No exam data present  MUSCULOSKELETAL:  Gait, strength, tone, movements noted in Neurologic exam below  NEUROLOGIC: MENTAL STATUS:  No flowsheet data found.  awake, alert, oriented to person, place and time  recent and remote memory intact  normal attention and concentration  language fluent, comprehension intact, naming intact  fund of knowledge appropriate  CRANIAL NERVE:   2nd - no papilledema on fundoscopic exam  2nd, 3rd, 4th, 6th - pupils equal and reactive to light, visual fields full to confrontation, extraocular muscles intact, no nystagmus  5th - facial sensation symmetric  7th - facial strength symmetric  8th - hearing intact  9th - palate elevates symmetrically, uvula midline  11th - shoulder shrug symmetric  12th - tongue protrusion midline  MOTOR:   normal bulk and tone, full strength in the BUE, BLE; EXCEPT LIMITED IN LUE DUE TO "FROZEN SHOULDER"  SENSORY:   normal and symmetric to light touch, temperature, vibration  COORDINATION:   finger-nose-finger, fine finger movements normal  REFLEXES:   deep tendon reflexes present and symmetric  GAIT/STATION:   narrow based gait     DIAGNOSTIC DATA (LABS, IMAGING, TESTING) - I reviewed patient records, labs, notes, testing and imaging myself where available.  Lab Results  Component Value Date   WBC 8.4 02/25/2020   HGB 13.6 02/25/2020   HCT 40.0 02/25/2020   MCV 93.9 02/25/2020   PLT 276 02/25/2020      Component Value Date/Time    NA 142 02/25/2020 2011   NA 142 01/09/2020 0906  NA 139 03/03/2016 0946   K 3.8 02/25/2020 2011   K 4.1 03/03/2016 0946   CL 106 02/25/2020 2011   CO2 26 02/25/2020 2006   CO2 28 03/03/2016 0946   GLUCOSE 114 (H) 02/25/2020 2011   GLUCOSE 120 03/03/2016 0946   BUN 23 02/25/2020 2011   BUN 19 01/09/2020 0906   BUN 14.2 03/03/2016 0946   CREATININE 1.50 (H) 02/25/2020 2011   CREATININE 1.52 (H) 04/25/2019 1447   CREATININE 1.1 03/03/2016 0946   CALCIUM 9.2 02/25/2020 2006   CALCIUM 9.1 03/03/2016 0946   PROT 7.2 02/25/2020 2006   PROT 7.0 01/09/2020 0906   PROT 7.2 03/03/2016 0946   ALBUMIN 3.9 02/25/2020 2006   ALBUMIN 4.2 01/09/2020 0906   ALBUMIN 3.4 (L) 03/03/2016 0946   AST 26 02/25/2020 2006   AST 15 03/03/2016 0946   ALT 19 02/25/2020 2006   ALT 9 03/03/2016 0946   ALKPHOS 69 02/25/2020 2006   ALKPHOS 79 03/03/2016 0946   BILITOT 0.7 02/25/2020 2006   BILITOT 0.4 01/09/2020 0906   BILITOT 0.37 03/03/2016 0946   GFRNONAA 30 (L) 02/25/2020 2006   GFRAA 35 (L) 02/25/2020 2006   Lab Results  Component Value Date   CHOL 141 02/04/2020   HDL 48 02/04/2020   LDLCALC 80 02/04/2020   TRIG 63 02/04/2020   CHOLHDL 2.9 02/04/2020   Lab Results  Component Value Date   HGBA1C 5.6 02/04/2020   No results found for: VITAMINB12 Lab Results  Component Value Date   TSH 2.961 04/20/2016     02/04/20 CTA head / neck No intracranial large or medium vessel occlusion.  Advanced irregular atherosclerotic disease of the aorta. Dilatation of the ascending aorta with diameter as large as 3.7 cm in the region studied.  Atherosclerotic disease at both carotid bifurcations and ICA bulb regions. 50% stenosis of the left ICA bulb. 60% stenosis of the right carotid bifurcation and ICA bulb.  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 Circumferential mural thrombus affecting the proximal 3.5 cm of the vessel, with patent lumen through that region of only  2.5 mm. Complete occlusion of the subclavian artery just beyond that. 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 Reconstitution in the mid upper cervical region by cervical collaterals, with subsequent antegrade flow beyond that. 70% stenosis of the left V4 segment.  30% stenosis of the proximal right subclavian artery. 70% stenosis of the right vertebral artery origin but wide patency beyond that to the basilar.   02/05/20 MRI brain IMPRESSION: Patient terminated the study after T1 scout imaging was performed. Therefore, there is very limited information. There is no gross morphologic change when compared to the CT studies of yesterday.  02/25/20 CT head / cervical 1. Posterior scalp hematoma without skull fracture or intracranial hemorrhage. 2. No cervical spine fracture or traumatic subluxation. 3. Stable moderate diffuse cerebral and cerebellar atrophy. 4. Stable moderate chronic small vessel white matter ischemic changes in both cerebral hemispheres. 5. Multilevel cervical spine degenerative changes. 6. Dense bilateral carotid artery atheromatous calcifications. 7. Multinodular thyroid gland with the largest measuring 1.4 cm in maximum diameter. No followup recommended (ref: J Am Coll Radiol. 2015 Feb;12(2): 143-50).    ASSESSMENT AND PLAN  86 y.o. year old female here with:  Dx:  1. TIA (transient ischemic attack)     PLAN:  TIA (due to pAfib, lipids) - continue eliquis, pravastatin  PAD / CAD / intracranial atherosclerosis - continue aspirin 81mg  daily   Return  for pending if symptoms worsen or fail to improve, return to PCP.    Penni Bombard, MD [id number], 0000000 AM Certified in Neurology, Neurophysiology and Neuroimaging  [facility] [address], [place], [place] [address] [phone]

## 2020-03-14 NOTE — Patient Instructions (Addendum)
TIA (due to pAfib, lipids) - continue eliquis, pravastatin  atherosclerosis - continue aspirin 81mg  daily

## 2020-03-15 DIAGNOSIS — I69334 Monoplegia of upper limb following cerebral infarction affecting left non-dominant side: Secondary | ICD-10-CM | POA: Diagnosis not present

## 2020-03-15 DIAGNOSIS — I130 Hypertensive heart and chronic kidney disease with heart failure and stage 1 through stage 4 chronic kidney disease, or unspecified chronic kidney disease: Secondary | ICD-10-CM | POA: Diagnosis not present

## 2020-03-15 DIAGNOSIS — R54 Age-related physical debility: Secondary | ICD-10-CM | POA: Diagnosis not present

## 2020-03-15 DIAGNOSIS — I69015 Cognitive social or emotional deficit following nontraumatic subarachnoid hemorrhage: Secondary | ICD-10-CM | POA: Diagnosis not present

## 2020-03-15 DIAGNOSIS — M7502 Adhesive capsulitis of left shoulder: Secondary | ICD-10-CM | POA: Diagnosis not present

## 2020-03-15 DIAGNOSIS — I480 Paroxysmal atrial fibrillation: Secondary | ICD-10-CM | POA: Diagnosis not present

## 2020-03-20 DIAGNOSIS — R54 Age-related physical debility: Secondary | ICD-10-CM | POA: Diagnosis not present

## 2020-03-20 DIAGNOSIS — I480 Paroxysmal atrial fibrillation: Secondary | ICD-10-CM | POA: Diagnosis not present

## 2020-03-20 DIAGNOSIS — M7502 Adhesive capsulitis of left shoulder: Secondary | ICD-10-CM | POA: Diagnosis not present

## 2020-03-20 DIAGNOSIS — I69015 Cognitive social or emotional deficit following nontraumatic subarachnoid hemorrhage: Secondary | ICD-10-CM | POA: Diagnosis not present

## 2020-03-20 DIAGNOSIS — I130 Hypertensive heart and chronic kidney disease with heart failure and stage 1 through stage 4 chronic kidney disease, or unspecified chronic kidney disease: Secondary | ICD-10-CM | POA: Diagnosis not present

## 2020-03-20 DIAGNOSIS — I69334 Monoplegia of upper limb following cerebral infarction affecting left non-dominant side: Secondary | ICD-10-CM | POA: Diagnosis not present

## 2020-03-22 DIAGNOSIS — I69334 Monoplegia of upper limb following cerebral infarction affecting left non-dominant side: Secondary | ICD-10-CM | POA: Diagnosis not present

## 2020-03-22 DIAGNOSIS — I69015 Cognitive social or emotional deficit following nontraumatic subarachnoid hemorrhage: Secondary | ICD-10-CM | POA: Diagnosis not present

## 2020-03-22 DIAGNOSIS — I130 Hypertensive heart and chronic kidney disease with heart failure and stage 1 through stage 4 chronic kidney disease, or unspecified chronic kidney disease: Secondary | ICD-10-CM | POA: Diagnosis not present

## 2020-03-22 DIAGNOSIS — R54 Age-related physical debility: Secondary | ICD-10-CM | POA: Diagnosis not present

## 2020-03-22 DIAGNOSIS — I480 Paroxysmal atrial fibrillation: Secondary | ICD-10-CM | POA: Diagnosis not present

## 2020-03-22 DIAGNOSIS — M7502 Adhesive capsulitis of left shoulder: Secondary | ICD-10-CM | POA: Diagnosis not present

## 2020-03-24 DIAGNOSIS — I69334 Monoplegia of upper limb following cerebral infarction affecting left non-dominant side: Secondary | ICD-10-CM | POA: Diagnosis not present

## 2020-03-24 DIAGNOSIS — I130 Hypertensive heart and chronic kidney disease with heart failure and stage 1 through stage 4 chronic kidney disease, or unspecified chronic kidney disease: Secondary | ICD-10-CM | POA: Diagnosis not present

## 2020-03-24 DIAGNOSIS — I69015 Cognitive social or emotional deficit following nontraumatic subarachnoid hemorrhage: Secondary | ICD-10-CM | POA: Diagnosis not present

## 2020-03-24 DIAGNOSIS — I480 Paroxysmal atrial fibrillation: Secondary | ICD-10-CM | POA: Diagnosis not present

## 2020-03-24 DIAGNOSIS — R54 Age-related physical debility: Secondary | ICD-10-CM | POA: Diagnosis not present

## 2020-03-24 DIAGNOSIS — M7502 Adhesive capsulitis of left shoulder: Secondary | ICD-10-CM | POA: Diagnosis not present

## 2020-03-26 DIAGNOSIS — I69015 Cognitive social or emotional deficit following nontraumatic subarachnoid hemorrhage: Secondary | ICD-10-CM | POA: Diagnosis not present

## 2020-03-26 DIAGNOSIS — R54 Age-related physical debility: Secondary | ICD-10-CM | POA: Diagnosis not present

## 2020-03-26 DIAGNOSIS — I480 Paroxysmal atrial fibrillation: Secondary | ICD-10-CM | POA: Diagnosis not present

## 2020-03-26 DIAGNOSIS — I130 Hypertensive heart and chronic kidney disease with heart failure and stage 1 through stage 4 chronic kidney disease, or unspecified chronic kidney disease: Secondary | ICD-10-CM | POA: Diagnosis not present

## 2020-03-26 DIAGNOSIS — I69334 Monoplegia of upper limb following cerebral infarction affecting left non-dominant side: Secondary | ICD-10-CM | POA: Diagnosis not present

## 2020-03-26 DIAGNOSIS — M7502 Adhesive capsulitis of left shoulder: Secondary | ICD-10-CM | POA: Diagnosis not present

## 2020-03-28 DIAGNOSIS — I69015 Cognitive social or emotional deficit following nontraumatic subarachnoid hemorrhage: Secondary | ICD-10-CM | POA: Diagnosis not present

## 2020-03-28 DIAGNOSIS — I69334 Monoplegia of upper limb following cerebral infarction affecting left non-dominant side: Secondary | ICD-10-CM | POA: Diagnosis not present

## 2020-03-28 DIAGNOSIS — I130 Hypertensive heart and chronic kidney disease with heart failure and stage 1 through stage 4 chronic kidney disease, or unspecified chronic kidney disease: Secondary | ICD-10-CM | POA: Diagnosis not present

## 2020-03-28 DIAGNOSIS — R54 Age-related physical debility: Secondary | ICD-10-CM | POA: Diagnosis not present

## 2020-03-28 DIAGNOSIS — M7502 Adhesive capsulitis of left shoulder: Secondary | ICD-10-CM | POA: Diagnosis not present

## 2020-03-28 DIAGNOSIS — I480 Paroxysmal atrial fibrillation: Secondary | ICD-10-CM | POA: Diagnosis not present

## 2020-04-05 DIAGNOSIS — I69334 Monoplegia of upper limb following cerebral infarction affecting left non-dominant side: Secondary | ICD-10-CM | POA: Diagnosis not present

## 2020-04-05 DIAGNOSIS — M7502 Adhesive capsulitis of left shoulder: Secondary | ICD-10-CM | POA: Diagnosis not present

## 2020-04-05 DIAGNOSIS — I130 Hypertensive heart and chronic kidney disease with heart failure and stage 1 through stage 4 chronic kidney disease, or unspecified chronic kidney disease: Secondary | ICD-10-CM | POA: Diagnosis not present

## 2020-04-05 DIAGNOSIS — I480 Paroxysmal atrial fibrillation: Secondary | ICD-10-CM | POA: Diagnosis not present

## 2020-04-05 DIAGNOSIS — I69015 Cognitive social or emotional deficit following nontraumatic subarachnoid hemorrhage: Secondary | ICD-10-CM | POA: Diagnosis not present

## 2020-04-05 DIAGNOSIS — R54 Age-related physical debility: Secondary | ICD-10-CM | POA: Diagnosis not present

## 2020-04-08 DIAGNOSIS — I130 Hypertensive heart and chronic kidney disease with heart failure and stage 1 through stage 4 chronic kidney disease, or unspecified chronic kidney disease: Secondary | ICD-10-CM | POA: Diagnosis not present

## 2020-04-08 DIAGNOSIS — I480 Paroxysmal atrial fibrillation: Secondary | ICD-10-CM | POA: Diagnosis not present

## 2020-04-08 DIAGNOSIS — I69015 Cognitive social or emotional deficit following nontraumatic subarachnoid hemorrhage: Secondary | ICD-10-CM | POA: Diagnosis not present

## 2020-04-08 DIAGNOSIS — M7502 Adhesive capsulitis of left shoulder: Secondary | ICD-10-CM | POA: Diagnosis not present

## 2020-04-08 DIAGNOSIS — R54 Age-related physical debility: Secondary | ICD-10-CM | POA: Diagnosis not present

## 2020-04-08 DIAGNOSIS — I69334 Monoplegia of upper limb following cerebral infarction affecting left non-dominant side: Secondary | ICD-10-CM | POA: Diagnosis not present

## 2020-04-11 DIAGNOSIS — I480 Paroxysmal atrial fibrillation: Secondary | ICD-10-CM | POA: Diagnosis not present

## 2020-04-11 DIAGNOSIS — I69015 Cognitive social or emotional deficit following nontraumatic subarachnoid hemorrhage: Secondary | ICD-10-CM | POA: Diagnosis not present

## 2020-04-11 DIAGNOSIS — M7502 Adhesive capsulitis of left shoulder: Secondary | ICD-10-CM | POA: Diagnosis not present

## 2020-04-11 DIAGNOSIS — I69334 Monoplegia of upper limb following cerebral infarction affecting left non-dominant side: Secondary | ICD-10-CM | POA: Diagnosis not present

## 2020-04-11 DIAGNOSIS — Z7901 Long term (current) use of anticoagulants: Secondary | ICD-10-CM | POA: Diagnosis not present

## 2020-04-11 DIAGNOSIS — I714 Abdominal aortic aneurysm, without rupture: Secondary | ICD-10-CM | POA: Diagnosis not present

## 2020-04-11 DIAGNOSIS — I509 Heart failure, unspecified: Secondary | ICD-10-CM | POA: Diagnosis not present

## 2020-04-11 DIAGNOSIS — Z87891 Personal history of nicotine dependence: Secondary | ICD-10-CM | POA: Diagnosis not present

## 2020-04-11 DIAGNOSIS — I130 Hypertensive heart and chronic kidney disease with heart failure and stage 1 through stage 4 chronic kidney disease, or unspecified chronic kidney disease: Secondary | ICD-10-CM | POA: Diagnosis not present

## 2020-04-11 DIAGNOSIS — I70209 Unspecified atherosclerosis of native arteries of extremities, unspecified extremity: Secondary | ICD-10-CM | POA: Diagnosis not present

## 2020-04-11 DIAGNOSIS — N183 Chronic kidney disease, stage 3 unspecified: Secondary | ICD-10-CM | POA: Diagnosis not present

## 2020-04-11 DIAGNOSIS — Z853 Personal history of malignant neoplasm of breast: Secondary | ICD-10-CM | POA: Diagnosis not present

## 2020-04-11 DIAGNOSIS — I2510 Atherosclerotic heart disease of native coronary artery without angina pectoris: Secondary | ICD-10-CM | POA: Diagnosis not present

## 2020-04-11 DIAGNOSIS — R54 Age-related physical debility: Secondary | ICD-10-CM | POA: Diagnosis not present

## 2020-04-11 DIAGNOSIS — F329 Major depressive disorder, single episode, unspecified: Secondary | ICD-10-CM | POA: Diagnosis not present

## 2020-04-11 DIAGNOSIS — Z955 Presence of coronary angioplasty implant and graft: Secondary | ICD-10-CM | POA: Diagnosis not present

## 2020-04-17 DIAGNOSIS — I69015 Cognitive social or emotional deficit following nontraumatic subarachnoid hemorrhage: Secondary | ICD-10-CM | POA: Diagnosis not present

## 2020-04-17 DIAGNOSIS — R54 Age-related physical debility: Secondary | ICD-10-CM | POA: Diagnosis not present

## 2020-04-17 DIAGNOSIS — I130 Hypertensive heart and chronic kidney disease with heart failure and stage 1 through stage 4 chronic kidney disease, or unspecified chronic kidney disease: Secondary | ICD-10-CM | POA: Diagnosis not present

## 2020-04-17 DIAGNOSIS — I69334 Monoplegia of upper limb following cerebral infarction affecting left non-dominant side: Secondary | ICD-10-CM | POA: Diagnosis not present

## 2020-04-17 DIAGNOSIS — I480 Paroxysmal atrial fibrillation: Secondary | ICD-10-CM | POA: Diagnosis not present

## 2020-04-17 DIAGNOSIS — M7502 Adhesive capsulitis of left shoulder: Secondary | ICD-10-CM | POA: Diagnosis not present

## 2020-04-25 DIAGNOSIS — R54 Age-related physical debility: Secondary | ICD-10-CM | POA: Diagnosis not present

## 2020-04-25 DIAGNOSIS — M7502 Adhesive capsulitis of left shoulder: Secondary | ICD-10-CM | POA: Diagnosis not present

## 2020-04-25 DIAGNOSIS — I130 Hypertensive heart and chronic kidney disease with heart failure and stage 1 through stage 4 chronic kidney disease, or unspecified chronic kidney disease: Secondary | ICD-10-CM | POA: Diagnosis not present

## 2020-04-25 DIAGNOSIS — I69015 Cognitive social or emotional deficit following nontraumatic subarachnoid hemorrhage: Secondary | ICD-10-CM | POA: Diagnosis not present

## 2020-04-25 DIAGNOSIS — I69334 Monoplegia of upper limb following cerebral infarction affecting left non-dominant side: Secondary | ICD-10-CM | POA: Diagnosis not present

## 2020-04-25 DIAGNOSIS — I480 Paroxysmal atrial fibrillation: Secondary | ICD-10-CM | POA: Diagnosis not present

## 2020-04-30 ENCOUNTER — Other Ambulatory Visit: Payer: Self-pay | Admitting: Thoracic Surgery (Cardiothoracic Vascular Surgery)

## 2020-04-30 DIAGNOSIS — I712 Thoracic aortic aneurysm, without rupture, unspecified: Secondary | ICD-10-CM

## 2020-04-30 DIAGNOSIS — R911 Solitary pulmonary nodule: Secondary | ICD-10-CM

## 2020-04-30 DIAGNOSIS — I7121 Aneurysm of the ascending aorta, without rupture: Secondary | ICD-10-CM

## 2020-05-07 DIAGNOSIS — R54 Age-related physical debility: Secondary | ICD-10-CM | POA: Diagnosis not present

## 2020-05-07 DIAGNOSIS — I69015 Cognitive social or emotional deficit following nontraumatic subarachnoid hemorrhage: Secondary | ICD-10-CM | POA: Diagnosis not present

## 2020-05-07 DIAGNOSIS — I69334 Monoplegia of upper limb following cerebral infarction affecting left non-dominant side: Secondary | ICD-10-CM | POA: Diagnosis not present

## 2020-05-07 DIAGNOSIS — I130 Hypertensive heart and chronic kidney disease with heart failure and stage 1 through stage 4 chronic kidney disease, or unspecified chronic kidney disease: Secondary | ICD-10-CM | POA: Diagnosis not present

## 2020-05-07 DIAGNOSIS — I480 Paroxysmal atrial fibrillation: Secondary | ICD-10-CM | POA: Diagnosis not present

## 2020-05-07 DIAGNOSIS — M7502 Adhesive capsulitis of left shoulder: Secondary | ICD-10-CM | POA: Diagnosis not present

## 2020-05-11 DIAGNOSIS — I70209 Unspecified atherosclerosis of native arteries of extremities, unspecified extremity: Secondary | ICD-10-CM | POA: Diagnosis not present

## 2020-05-11 DIAGNOSIS — I480 Paroxysmal atrial fibrillation: Secondary | ICD-10-CM | POA: Diagnosis not present

## 2020-05-11 DIAGNOSIS — Z87891 Personal history of nicotine dependence: Secondary | ICD-10-CM | POA: Diagnosis not present

## 2020-05-11 DIAGNOSIS — I69015 Cognitive social or emotional deficit following nontraumatic subarachnoid hemorrhage: Secondary | ICD-10-CM | POA: Diagnosis not present

## 2020-05-11 DIAGNOSIS — M7502 Adhesive capsulitis of left shoulder: Secondary | ICD-10-CM | POA: Diagnosis not present

## 2020-05-11 DIAGNOSIS — I714 Abdominal aortic aneurysm, without rupture: Secondary | ICD-10-CM | POA: Diagnosis not present

## 2020-05-11 DIAGNOSIS — N183 Chronic kidney disease, stage 3 unspecified: Secondary | ICD-10-CM | POA: Diagnosis not present

## 2020-05-11 DIAGNOSIS — I2510 Atherosclerotic heart disease of native coronary artery without angina pectoris: Secondary | ICD-10-CM | POA: Diagnosis not present

## 2020-05-11 DIAGNOSIS — R54 Age-related physical debility: Secondary | ICD-10-CM | POA: Diagnosis not present

## 2020-05-11 DIAGNOSIS — Z853 Personal history of malignant neoplasm of breast: Secondary | ICD-10-CM | POA: Diagnosis not present

## 2020-05-11 DIAGNOSIS — I69334 Monoplegia of upper limb following cerebral infarction affecting left non-dominant side: Secondary | ICD-10-CM | POA: Diagnosis not present

## 2020-05-11 DIAGNOSIS — F329 Major depressive disorder, single episode, unspecified: Secondary | ICD-10-CM | POA: Diagnosis not present

## 2020-05-11 DIAGNOSIS — Z955 Presence of coronary angioplasty implant and graft: Secondary | ICD-10-CM | POA: Diagnosis not present

## 2020-05-11 DIAGNOSIS — I130 Hypertensive heart and chronic kidney disease with heart failure and stage 1 through stage 4 chronic kidney disease, or unspecified chronic kidney disease: Secondary | ICD-10-CM | POA: Diagnosis not present

## 2020-05-11 DIAGNOSIS — Z7901 Long term (current) use of anticoagulants: Secondary | ICD-10-CM | POA: Diagnosis not present

## 2020-05-11 DIAGNOSIS — I509 Heart failure, unspecified: Secondary | ICD-10-CM | POA: Diagnosis not present

## 2020-05-22 DIAGNOSIS — I480 Paroxysmal atrial fibrillation: Secondary | ICD-10-CM | POA: Diagnosis not present

## 2020-05-22 DIAGNOSIS — I69015 Cognitive social or emotional deficit following nontraumatic subarachnoid hemorrhage: Secondary | ICD-10-CM | POA: Diagnosis not present

## 2020-05-22 DIAGNOSIS — I130 Hypertensive heart and chronic kidney disease with heart failure and stage 1 through stage 4 chronic kidney disease, or unspecified chronic kidney disease: Secondary | ICD-10-CM | POA: Diagnosis not present

## 2020-05-22 DIAGNOSIS — I69334 Monoplegia of upper limb following cerebral infarction affecting left non-dominant side: Secondary | ICD-10-CM | POA: Diagnosis not present

## 2020-05-22 DIAGNOSIS — M7502 Adhesive capsulitis of left shoulder: Secondary | ICD-10-CM | POA: Diagnosis not present

## 2020-05-22 DIAGNOSIS — R54 Age-related physical debility: Secondary | ICD-10-CM | POA: Diagnosis not present

## 2020-06-04 DIAGNOSIS — I480 Paroxysmal atrial fibrillation: Secondary | ICD-10-CM | POA: Diagnosis not present

## 2020-06-04 DIAGNOSIS — I69015 Cognitive social or emotional deficit following nontraumatic subarachnoid hemorrhage: Secondary | ICD-10-CM | POA: Diagnosis not present

## 2020-06-04 DIAGNOSIS — I130 Hypertensive heart and chronic kidney disease with heart failure and stage 1 through stage 4 chronic kidney disease, or unspecified chronic kidney disease: Secondary | ICD-10-CM | POA: Diagnosis not present

## 2020-06-04 DIAGNOSIS — R54 Age-related physical debility: Secondary | ICD-10-CM | POA: Diagnosis not present

## 2020-06-04 DIAGNOSIS — I69334 Monoplegia of upper limb following cerebral infarction affecting left non-dominant side: Secondary | ICD-10-CM | POA: Diagnosis not present

## 2020-06-04 DIAGNOSIS — M7502 Adhesive capsulitis of left shoulder: Secondary | ICD-10-CM | POA: Diagnosis not present

## 2020-06-07 ENCOUNTER — Ambulatory Visit
Admission: RE | Admit: 2020-06-07 | Discharge: 2020-06-07 | Disposition: A | Discharge status: Home / Self Care | Payer: Medicare Other | Source: Ambulatory Visit | Attending: Thoracic Surgery (Cardiothoracic Vascular Surgery) | Admitting: Thoracic Surgery (Cardiothoracic Vascular Surgery)

## 2020-06-07 DIAGNOSIS — I712 Thoracic aortic aneurysm, without rupture, unspecified: Secondary | ICD-10-CM

## 2020-06-07 DIAGNOSIS — R911 Solitary pulmonary nodule: Secondary | ICD-10-CM

## 2020-06-12 ENCOUNTER — Other Ambulatory Visit: Payer: Self-pay

## 2020-06-12 ENCOUNTER — Ambulatory Visit: Payer: Medicare Other | Admitting: Thoracic Surgery (Cardiothoracic Vascular Surgery)

## 2020-07-03 ENCOUNTER — Other Ambulatory Visit: Payer: Self-pay

## 2020-07-03 ENCOUNTER — Encounter: Payer: Self-pay | Admitting: Cardiology

## 2020-07-03 ENCOUNTER — Ambulatory Visit (INDEPENDENT_AMBULATORY_CARE_PROVIDER_SITE_OTHER): Payer: 59 | Admitting: Cardiology

## 2020-07-03 VITALS — BP 116/72 | HR 84 | Ht 63.0 in | Wt 140.0 lb

## 2020-07-03 DIAGNOSIS — I5032 Chronic diastolic (congestive) heart failure: Secondary | ICD-10-CM

## 2020-07-03 DIAGNOSIS — I350 Nonrheumatic aortic (valve) stenosis: Secondary | ICD-10-CM | POA: Diagnosis not present

## 2020-07-03 DIAGNOSIS — I712 Thoracic aortic aneurysm, without rupture: Secondary | ICD-10-CM

## 2020-07-03 DIAGNOSIS — I2510 Atherosclerotic heart disease of native coronary artery without angina pectoris: Secondary | ICD-10-CM | POA: Diagnosis not present

## 2020-07-03 DIAGNOSIS — I63019 Cerebral infarction due to thrombosis of unspecified vertebral artery: Secondary | ICD-10-CM

## 2020-07-03 DIAGNOSIS — I480 Paroxysmal atrial fibrillation: Secondary | ICD-10-CM | POA: Diagnosis not present

## 2020-07-03 DIAGNOSIS — I7121 Aneurysm of the ascending aorta, without rupture: Secondary | ICD-10-CM

## 2020-07-03 NOTE — Progress Notes (Signed)
Primary Care Provider: Carolee Rota, NP Cardiologist: No primary care provider on file. CVTS:  Dr. Roxan Hockey  Clinic Note: Chief Complaint  Patient presents with  . Follow-up    6 month; TIA in February  . Coronary Artery Disease    No angina  . Atrial Fibrillation    No symptoms; was placed on Eliquis    HPI:    Leslie Carr is a 86 y.o. female with a PMH (NSTEMI in October 2019) CAD-multivessel PCI (OM1 and extensive RCA atherectomy PCI), chronic combined CHF mostly diastolic, PAF ([id number] of 7 -> age, female vascular CHF)), AAA/TAA (followed by Dr. Roxan Hockey), TIA & prior CVA, and history of GI bleed who presents here for 42-month follow-up.Marland Kitchen  She had previously a patient of Dr. Daneen Schick.  Was seen in 2017 by Delrae Rend, PA with history of what appeared to be A. fib RVR noted during hospitalization.  She was started on Xarelto--subsequently had a GI bleed.   Has had issues with low blood pressures with orthostatic hypotension. -> Had functional decline after her husband's death in 06-26-2017.  Exertional dyspnea with stairs.  She does have some mild residual left arm weakness from her prior CVA.  Hospitalized in late September 29 with chest pain and dyspnea/non-STEMI -->  Cardiac cath showed multivessel disease and she underwent PCI described below.  April-May 2020 - symptomatic anemia - GI bleed.  2 units transfusion distal esophageal ulcer.  Aspirin Plavix temporarily held, restarted on May 3.  Distal esophageal ulcer as well as prepyloric gastric ulcers.  TYGER OKA was last seen in January 2021 (which was my first visit with her since her cath).  She was doing relatively well at that time but was noted to be a poor historian (frustrated because her daughter was not back).  Multiple questions addressed.  No further bleeding since April 2020.  Did note some orthostatic symptoms but not as bad.  No CHF symptoms.  Baseline dyspnea and cough associated with  allergies.  No anginal symptoms.  Trying to do some type of exertional activity for 40 minutes including walking and other stretching exercises.  She did note exertional dyspnea going up stairs, no tachycardia palpitations episodes. --> Despite extensive discussion, she declined DOAC, but was agreeable for Plavix.  Recent Hospitalizations:  February 12-15th, 2021: Admitted for TIA (2 every 3 hours of confusion and dizziness, unable to talk -> resolved spontaneously)--started on Eliquis, and converted from Plavix to aspirin..  Continued on pravastatin.  Reviewed  CV studies:    The following studies were reviewed today: (if available, images/films reviewed: From Epic Chart or Care Everywhere)  TTE February 04, 2020: EF 60 to 65%.  No R WMA.  Mild concentric LVH.  GR 1 DD.  Normal RV function.  Mild LA dilation.  Mild calcific aortic stenosis (mean gradient 10 mmHg-but visually appears to be worse).  No change  Interval History:   Leslie Carr is here today for her second visit with me and thankfully has her daughter with her.  She seems to be doing okay overall from a cardiac standpoint.  She has not been as active as she used to be since all the recurrences over the last couple years.  She says he feels a flushing sensation on occasion that makes her feel little lightheaded but she has not had any real syncope or near syncope.  No real dizziness.  She says she gets short of breath if she does not more  prominent activity such as going up stairs with something in her hands.  However she still tries to walk about a mile a day usually in the morning to avoid the heat.  She does not do well in the heat.  She also just tried to do some mild exercise in the house.  The only issue was that her balance is not great so she uses her walker when she walks. She is tired of the day and is taking naps during the day, but does not really sleep well at night.  She denies any anginal symptoms of chest pain or  pressure.  No PND, orthopnea or edema.  She has not noted any rapid irregular heart rates palpitations.  Despite having her TIA, she has not had any syncope or near syncope.  No recurrent TIA symptoms since February. She does not seem to be having bleeding issues now that she is on Eliquis.  This was a reluctant submission for her to agree to take a DOAC, but the TIA scared her enough.  Has mild end of day swelling, but nothing worrisome to her.  The shortness of breath walking up steps dates back well behind for her MI.  CV Review of Symptoms (Summary): Cardiovascular ROS: positive for - Exertional dyspnea up an incline or upstairs, but not with flat.  Mild orthostatic symptoms, but notably improved.  Also mild end of day swelling. negative for - chest pain, irregular heartbeat, orthopnea, palpitations, paroxysmal nocturnal dyspnea, rapid heart rate, shortness of breath or Syncope/near syncope, TIA/amaurosis fugax, claudication.  The patient does not have symptoms concerning for COVID-19 infection (fever, chills, cough, or new shortness of breath).  The patient is practicing social distancing & masking.    Completed  Covid vaccine injection #2 in May  REVIEWED OF SYSTEMS   A comprehensive ROS was performed. Review of Systems  Constitutional: Negative for malaise/fatigue and weight loss.  HENT: Positive for hearing loss. Negative for nosebleeds.   Respiratory: Negative for cough (Off and on) and shortness of breath (Pretty much at baseline.).   Cardiovascular: Negative for claudication.  Gastrointestinal: Positive for constipation. Negative for abdominal pain, blood in stool, diarrhea and melena.  Genitourinary: Negative for dysuria and hematuria.  Musculoskeletal: Positive for back pain and joint pain.  Neurological: Positive for dizziness (Positional--sits on the foot of her bed for a few minutes before she stands up in the morning.), focal weakness (She really does not notice her mild  weakness from the stroke.), weakness (Generalized) and headaches (Off and on).       Very poor balance  Psychiatric/Behavioral: Positive for depression (If not depressed, dysthymic.) and memory loss. The patient is nervous/anxious. The patient does not have insomnia (Not so much insomnia, but hard to get back to sleep when she wakes up.).        Daytime somnolence noted.  Falls asleep a lot during the day.  All other systems reviewed and are negative.  I have reviewed and (if needed) personally updated the patient's problem list, medications, allergies, past medical and surgical history, social and family history.   PAST MEDICAL HISTORY   Past Medical History:  Diagnosis Date  . Acute metabolic encephalopathy 01/23/5426  . Breast cancer ([place])   . CHF (congestive heart failure) ([place])   . Coronary artery disease due to calcified coronary lesion 09/2018   ACS PCI of OM2 on 09/21/2018 followed by staged orbital atherectomy based PCI of ostial to distal RCA.  (Details in Surgical History)  .  Diastolic dysfunction with chronic heart failure ([place])   . History of bronchitis   . Hypercholesteremia   . Mass of middle lobe of right lung   . Mild aortic stenosis by prior echocardiogram 09/2018   Mean gradient 14 million mercury, peak 24 mmHg  . Numbness and tingling in left hand   . Pleural effusion on left   . Shortness of breath dyspnea    "only going up a flight of stairs"  . Stroke [facility]) 2014   left sided weakness - mainly left hand  . TIA (transient ischemic attack) 01/2020    PAST SURGICAL HISTORY   Past Surgical History:  Procedure Laterality Date  . ABDOMINAL HYSTERECTOMY  1976  . CATARACT EXTRACTION Right   . COLONOSCOPY    . CORONARY ANGIOPLASTY WITH STENT PLACEMENT    . CORONARY ATHERECTOMY N/A 09/24/2018   Procedure: [place] CORONARY STENT PLACEMENT;  Surgeon: Leonie Man, MD;  Location: [place] CV LAB;; staged PCI: Ostial RCA 70%, prox-mid RCA 70% and  mid RCA 95 and 50%. ->  Orbital atherectomy followed by 3 overlapping DES stents (proximal to distal): Xience Sierra 2.75 mm a 28 mm (2.9 mm), 2.5 mm x 33 mm (2.8 mm), 2.25 mm a 12 mm (tapered from 2.6 to 2.4 mm)  . CORONARY STENT INTERVENTION N/A 09/21/2018   Procedure: CORONARY STENT INTERVENTION;  Surgeon: Leonie Man, MD;  Location: [place] CV LAB;  Service: Cardiovascular;; Ostial OM2 85% (DES PCI, Xience Sierra 2.5 mm x 18 mm-minimal residual stenosis)  . DECORTICATION Left 04/17/2016   Procedure: Visceral Pleural DECORTICATION;  Surgeon: Melrose Nakayama, MD;  Location: [place];  Service: Thoracic;  Laterality: Left;  . ESOPHAGOGASTRODUODENOSCOPY N/A 04/20/2019   Procedure: ESOPHAGOGASTRODUODENOSCOPY (EGD);  Surgeon: Rogene Houston, MD;  [facility];  Service: Endoscopy;  Laterality: N/A;  APC- circumferential  . FOOT SURGERY Right    "bone spur removal"  . LEFT HEART CATH AND CORONARY ANGIOGRAPHY N/A 09/21/2018   Procedure: LEFT HEART CATH AND CORONARY ANGIOGRAPHY;  Surgeon: Leonie Man, MD;  Location: [place] CV LAB;  Service: Cardiovascular;; Ostial OM2 85% (DES PCI), dLCx 45%.  Lesion #2-3: Ostial RCA 70%, prox-mid RCA 70% and mid RCA 95 and 50%.  Distal RCA 30%, ostial RPDA 30%.  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 MASTECTOMY Left 1985  . PLEURAL BIOPSY Left 04/17/2016   Procedure:  Left PLEURAL BIOPSY;  Surgeon: Melrose Nakayama, MD;  Location: [place];  Service: Thoracic;  Laterality: Left;  . PLEURAL EFFUSION DRAINAGE Left 04/17/2016   Procedure: DRAINAGE OF Left PLEURAL EFFUSION;  Surgeon: Melrose Nakayama, MD;  Location: [place];  Service: Thoracic;  Laterality: Left;  . TONSILLECTOMY    . TRANSTHORACIC ECHOCARDIOGRAM  09/21/2018    EF 60 to 65%.  GR 1 DD.  Mild aortic s stenosis (mean gradient 14 mmHg, peak gradient 24 mmHg).  Mild LA dilation.  Lipomatous hypertrophy of intra-atrial septum.;; b) February 2021: EF 60 to  65%.  No R WMA.  Mild concentric LVH.  GR 1 DD.  Normal RV function.  Mild LA dilation.  Mild calcific aortic stenosis (mean gradient 10 mmHg-but visually appears to be worse).  No change  . VIDEO ASSISTED THORACOSCOPY Left 04/17/2016   Procedure:  Left VIDEO ASSISTED THORACOSCOPY with Drainage of Pleural Effusion, Pleural Biopsy, Visceral Pleural Decortication and Placement of On-Q pain pump;  Surgeon: Melrose Nakayama, MD;  Location: [place];  Service: Thoracic;  Laterality: Left;   . Cath-PCI 09/21/2018 & staged CSI atherectomy/RCA PCI 09/24/2018:  o Lesion #1: Ostial OM2 85% (DES PCI, Xience Sierra 2.5 mm x 18 mm-minimal residual stenosis), dLCx 45%.   o Lesion #2-3 (staged): Ostial RCA 70%, prox-mid RCA 70% and mid RCA 95 and 50%.  --> overlapping DES stents - proximal to distal: Xience Sierra 2.75 mm x 28 mm - 2.9 mm, 2.5 mm x 33 mm - 2.8 mm, 2.25 mm x 12 mm -tapered from 2.6 to 2.4 mm)  Diagnostic       Intervention    MEDICATIONS/ALLERGIES   Current Meds  Medication Sig  . acetaminophen (TYLENOL) 500 MG tablet Take 500 mg by mouth as needed. For pain when needed.  Marland Kitchen apixaban (ELIQUIS) 5 MG TABS tablet Take 1 tablet (5 mg total) by mouth 2 (two) times daily.  Marland Kitchen aspirin EC 81 MG EC tablet Take 1 tablet (81 mg total) by mouth daily.  Marland Kitchen docusate sodium (COLACE) 100 MG capsule Take 100 mg by mouth every evening.  . Multiple Vitamins-Minerals (EMERGEN-C IMMUNE PLUS PO) Take 1 packet by mouth every evening.   . pravastatin (PRAVACHOL) 40 MG tablet Take 40 mg by mouth daily.    Allergies  Allergen Reactions  . Lipitor [Atorvastatin] Other (See Comments)    Dizzy and make her head feel huge      SOCIAL HISTORY/FAMILY HISTORY   Social History   Tobacco Use  . Smoking status: Former Research scientist (life sciences)  . Smokeless tobacco: Never Used  . Tobacco comment: Quit over 40 years ago  Vaping Use  . Vaping Use: Never used  Substance Use Topics  . Alcohol use: No    Alcohol/week: 0.0 standard drinks      Comment: occas  . Drug use: No   Social History   Social History Narrative   03/14/20  Lives below daughter, down stairs apt      -Her husband died in 02-Jul-2018.  Family History family history includes Hypertension in her father and mother.   OBJCTIVE -PE, EKG, labs   Wt Readings from Last 3 Encounters:  07/03/20 140 lb (63.5 kg)  03/14/20 139 lb (63 kg)  02/25/20 138 lb (62.6 kg)    Physical Exam: BP 116/72 (BP Location: Right Arm, Patient Position: Sitting, Cuff Size: Normal)   Pulse 84   Ht 5\' 3"  (1.6 m)   Wt 140 lb (63.5 kg)   BMI 24.80 kg/m  Physical Exam Vitals reviewed.  Constitutional:      General: She is not in acute distress.    Appearance: Normal appearance. She is well-developed and normal weight.     Comments: Well-groomed.  Healthy-appearing  HENT:     Head: Normocephalic and atraumatic.  Neck:     Vascular: No carotid bruit (Radiated murmur), hepatojugular reflux or JVD.  Cardiovascular:     Rate and Rhythm: Normal rate and regular rhythm.  No extrasystoles are present.    Chest Wall: PMI is not displaced.     Pulses: Intact distal pulses.     Heart sounds: Murmur heard.  Medium-pitched harsh crescendo-decrescendo early systolic murmur is present with a grade of 2/6 radiating to the neck.  No friction rub. No gallop.   Pulmonary:     Effort: Pulmonary effort is normal. No respiratory distress.     Breath sounds: Normal breath sounds. No wheezing or rales.  Abdominal:     General: Bowel sounds are normal. There is no distension.  Palpations: Abdomen is soft.     Tenderness: There is no abdominal tenderness. There is no rebound.  Musculoskeletal:        General: Normal range of motion.     Cervical back: Normal range of motion and neck supple.  Neurological:     General: No focal deficit present.     Mental Status: She is alert and oriented to person, place, and time. Mental status is at baseline.     Gait: Gait abnormal (Somewhat slow  and unsteady).  Psychiatric:        Behavior: Behavior normal.        Judgment: Judgment normal.     Comments: Still is a poor historian, but more confident with her daughter there.  Seems to be down, but not depressed.     Adult ECG Report  Rate: 70 ;  Rhythm: normal sinus rhythm and Voltage criteria for LVH..  Otherwise normal axis, intervals and durations.;   Narrative Interpretation: Normal  Recent Labs:  Lab Results  Component Value Date   CHOL 141 02/04/2020   HDL 48 02/04/2020   LDLCALC 80 02/04/2020   TRIG 63 02/04/2020   CHOLHDL 2.9 02/04/2020   Lab Results  Component Value Date   CREATININE 1.50 (H) 02/25/2020   BUN 23 02/25/2020   NA 142 02/25/2020   K 3.8 02/25/2020   CL 106 02/25/2020   CO2 26 02/25/2020    ASSESSMENT/PLAN    Problem List Items Addressed This Visit    PAF (paroxysmal atrial fibrillation) ([place]); CHA2DS2-VASc Score = 7.  Now on DOAC -after recurrent TIA (2021) (Chronic)    She has known history of PAF, for which she is asymptomatic.  Unfortunately, she has suffered yet again another TIA with history of stroke as well.  Thankfully she agreed to switch to [place] following this most recent hospitalization. She was also converted from Plavix to aspirin which is acceptable post PCI.  Plan: Avoid aggressive management besides using coag.  Rate controlled without medications.  With borderline blood pressures, would not beta-blocker or calcium channel blocker.  Okay to hold Eliquis 2 to 3 days prior to procedures.  (3 days for neurologic procedures)      Relevant Orders   EKG 12-Lead (Completed)   Coronary artery disease involving native heart without angina pectoris - Primary (Chronic)    Two-vessel PCI resulting in pretty much full revascularization.  No further anginal symptoms.  No heart failure symptoms.  Converted from Plavix to Eliquis and aspirin.  Did not tolerate beta-blockers or ARB because of hypotension/orthostasis.  With her somewhat  unsteady gait, I am reluctant to be aggressive with BP management regardless, therefore we will hold off on low-dose beta-blocker.      Relevant Orders   EKG 12-Lead (Completed)   Chronic diastolic (congestive) heart failure (HCC) (Chronic)    Normal EF on echo.  No actual heart failure symptoms.  I think her exertional dyspnea is multifactorial.  Euvolemic on exam.  No PND orthopnea or edema.  We will hold off on any afterload reduction, and with orthostatic symptoms, would definitely avoid diuretic.      Mild aortic stenosis by prior echocardiogram (Chronic)    Read as mild to moderate on follow-up echocardiogram this February.  Gradients did not appear to be any different than in October 2019.  We can probably wait another 3 years to reassess.      Relevant Orders   EKG 12-Lead (Completed)   CVA (cerebral vascular accident) (  HCC) (Chronic)    Multiple different strokes and TIAs.  She is finally agreed to switch to Eliquis.  Probably with her GI bleeding issue it would be best as eventually stop aspirin and simply use Eliquis.  We will defer to neurology      Ascending aortic aneurysm ([place]) (Chronic)    Stable.  Followed by Dr. Koleen Nimrod          COVID-19 Education: The signs and symptoms of COVID-19 were discussed with the patient and how to seek care for testing (follow up with PCP or arrange E-visit).   The importance of social distancing was discussed today.  I spent a total of 22 minutes with the patient and chart review. >  50% of the time was spent in direct patient consultation.  -->  Additional time spent with chart review (studies, outside notes, etc): 10 Total Time: 32 min  Current medicines are reviewed at length with the patient today.  (+/- concerns) none   Patient Instructions / Medication Changes & Studies & Tests Ordered   Patient Instructions  Medication Instructions:  No changes  *If you need a refill on your cardiac medications before your next  appointment, please call your pharmacy*   Lab Work: Not needed    Testing/Procedures: Not needed   Follow-Up: At [facility], you and your health needs are our priority.  As part of our continuing mission to provide you with exceptional heart care, we have created designated Provider Care Teams.  These Care Teams include your primary Cardiologist (physician) and Advanced Practice Providers (APPs -  Physician Assistants and Nurse Practitioners) who all work together to provide you with the care you need, when you need it.  We recommend signing up for the patient portal called "MyChart".  Sign up information is provided on this After Visit Summary.  MyChart is used to connect with patients for Virtual Visits (Telemedicine).  Patients are able to view lab/test results, encounter notes, upcoming appointments, etc.  Non-urgent messages can be sent to your provider as well.   To learn more about what you can do with MyChart, go to [email or web address].    Your next appointment:   12 month(s)  The format for your next appointment:   In Person  Provider:   Glenetta Hew, MD       Studies Ordered:   Orders Placed This Encounter  Procedures  . EKG 12-Lead     Glenetta Hew, M.D., M.S. Interventional Cardiologist   Pager # [phone] Phone # [phone] [address] [place], [place] [address]   Thank you for choosing Heartcare at [facility]!!

## 2020-07-03 NOTE — Patient Instructions (Signed)

## 2020-07-04 ENCOUNTER — Ambulatory Visit: Payer: Medicare Other | Admitting: Thoracic Surgery (Cardiothoracic Vascular Surgery)

## 2020-07-07 ENCOUNTER — Encounter: Payer: Self-pay | Admitting: Cardiology

## 2020-07-07 NOTE — Assessment & Plan Note (Signed)
Two-vessel PCI resulting in pretty much full revascularization.  No further anginal symptoms.  No heart failure symptoms.  Converted from Plavix to Eliquis and aspirin.  Did not tolerate beta-blockers or ARB because of hypotension/orthostasis.  With her somewhat unsteady gait, I am reluctant to be aggressive with BP management regardless, therefore we will hold off on low-dose beta-blocker.

## 2020-07-07 NOTE — Assessment & Plan Note (Signed)
Read as mild to moderate on follow-up echocardiogram this February.  Gradients did not appear to be any different than in October 2019.  We can probably wait another 3 years to reassess.

## 2020-07-07 NOTE — Assessment & Plan Note (Signed)
Normal EF on echo.  No actual heart failure symptoms.  I think her exertional dyspnea is multifactorial.  Euvolemic on exam.  No PND orthopnea or edema.  We will hold off on any afterload reduction, and with orthostatic symptoms, would definitely avoid diuretic.

## 2020-07-07 NOTE — Assessment & Plan Note (Signed)
Multiple different strokes and TIAs.  She is finally agreed to switch to Eliquis.  Probably with her GI bleeding issue it would be best as eventually stop aspirin and simply use Eliquis.  We will defer to neurology

## 2020-07-07 NOTE — Assessment & Plan Note (Addendum)
She has known history of PAF, for which she is asymptomatic.  Unfortunately, she has suffered yet again another TIA with history of stroke as well.  Thankfully she agreed to switch to [place] following this most recent hospitalization. She was also converted from Plavix to aspirin which is acceptable post PCI.  Plan: Avoid aggressive management besides using coag.  Rate controlled without medications.  With borderline blood pressures, would not beta-blocker or calcium channel blocker.  Okay to hold Eliquis 2 to 3 days prior to procedures.  (3 days for neurologic procedures)

## 2020-07-07 NOTE — Assessment & Plan Note (Signed)
Stable.  Followed by Dr. Koleen Nimrod

## 2020-07-24 ENCOUNTER — Other Ambulatory Visit: Payer: Self-pay

## 2020-07-24 ENCOUNTER — Ambulatory Visit (INDEPENDENT_AMBULATORY_CARE_PROVIDER_SITE_OTHER): Payer: 59 | Admitting: Thoracic Surgery (Cardiothoracic Vascular Surgery)

## 2020-07-24 ENCOUNTER — Encounter: Payer: Self-pay | Admitting: Thoracic Surgery (Cardiothoracic Vascular Surgery)

## 2020-07-24 VITALS — BP 140/84 | HR 90 | Temp 97.8°F | Resp 20 | Ht 63.0 in | Wt 140.0 lb

## 2020-07-24 DIAGNOSIS — I2510 Atherosclerotic heart disease of native coronary artery without angina pectoris: Secondary | ICD-10-CM | POA: Diagnosis not present

## 2020-07-24 DIAGNOSIS — I7121 Aneurysm of the ascending aorta, without rupture: Secondary | ICD-10-CM

## 2020-07-24 DIAGNOSIS — I712 Thoracic aortic aneurysm, without rupture: Secondary | ICD-10-CM

## 2020-07-24 NOTE — Progress Notes (Signed)
[address]       [place] [address]             [phone]     HPI: Leslie Carr returns for follow-up of her ascending aortic aneurysm and thoracic aortic atherosclerosis.  She is accompanied by her daughter Leslie Carr.  Leslie Carr is an 86 year old woman with a history of hypertension, hyperlipidemia, coronary disease, stents, atrial fibrillation, heart murmur, mild to moderate aortic stenosis, thoracic aortic atherosclerosis, ascending aortic aneurysm, infrarenal abdominal aortic aneurysm, stroke, amyloid nodule the right middle lobe, and falls.  She had a decortication for an inflammatory pleural effusion in April 2017.  I have followed her since then for a right middle lobe lung nodule and an ascending aneurysm.  The right middle lobe nodule has been biopsied and pathology showed amyloid.  She had a stroke in February.  She has had a good recovery from that.  She saw Dr. Ellyn Hack a couple weeks ago.  She was changed from Plavix to Eliquis and a baby aspirin.  She has been having occasional pressure sensation substernally.  A lot of times this improves when she gets up and walks around.  She has refused to seek medical attention for that per her daughter.  Past Medical History:  Diagnosis Date  . Acute metabolic encephalopathy 8/46/9629  . Breast cancer ([place])   . CHF (congestive heart failure) ([place])   . Coronary artery disease due to calcified coronary lesion 09/2018   ACS PCI of OM2 on 09/21/2018 followed by staged orbital atherectomy based PCI of ostial to distal RCA.  (Details in Surgical History)  . Diastolic dysfunction with chronic heart failure ([place])   . History of bronchitis   . Hypercholesteremia   . Mass of middle lobe of right lung   . Mild aortic stenosis by prior echocardiogram 09/2018   Mean gradient 14 million mercury, peak 24 mmHg  . Numbness and tingling in left hand   . Pleural effusion on left   . Shortness of breath dyspnea    "only going  up a flight of stairs"  . Stroke [facility]) 2014   left sided weakness - mainly left hand  . TIA (transient ischemic attack) 01/2020    Current Outpatient Medications  Medication Sig Dispense Refill  . acetaminophen (TYLENOL) 500 MG tablet Take 500 mg by mouth as needed. For pain when needed.    Marland Kitchen apixaban (ELIQUIS) 5 MG TABS tablet Take 1 tablet (5 mg total) by mouth 2 (two) times daily. 60 tablet 0  . aspirin EC 81 MG EC tablet Take 1 tablet (81 mg total) by mouth daily.    Marland Kitchen docusate sodium (COLACE) 100 MG capsule Take 100 mg by mouth every evening.    . Multiple Vitamins-Minerals (EMERGEN-C IMMUNE PLUS PO) Take 1 packet by mouth every evening.     . pravastatin (PRAVACHOL) 40 MG tablet Take 40 mg by mouth daily.     No current facility-administered medications for this visit.    Physical Exam BP 140/84   Pulse 90   Temp 97.8 F (36.6 C) (Skin)   Resp 20   Ht 5\' 3"  (1.6 m)   Wt 140 lb (63.5 kg)   SpO2 97% Comment: RA  BMI 24.45 kg/m  86 year old woman in no acute distress Alert and oriented x3, no focal motor deficit Lungs clear Cardiac regular rate and rhythm with a 2/6 crescendo decrescendo systolic murmur No peripheral edema  Diagnostic Tests: CT CHEST  WITHOUT CONTRAST  TECHNIQUE: Multidetector CT imaging of the chest was performed following the standard protocol without IV contrast.  COMPARISON:  CT chest 11/22/2019  FINDINGS: Cardiovascular: Marked diffuse thoracic aortic atherosclerotic calcifications. Ascending thoracic aortic aneurysm is stable at 4.4 cm, image 75/2. Three vessel coronary artery atherosclerotic calcifications.  Mediastinum/Nodes: No enlarged mediastinal or axillary lymph nodes. Thyroid gland, trachea, and esophagus demonstrate no significant findings.  Lungs/Pleura: No pleural effusion, airspace consolidation or atelectasis. Chronic fibrosis, scarring and volume loss of the lingula and medial left upper lobe appears stable.  Partially calcified subpleural mass along the major fissure in the right middle lobe measures 3.3 by 2.0 cm, image 75/8. Unchanged. Calcified granuloma noted in the anterolateral right lower lobe. Pleuroparenchymal scarring within the anterolateral left base is stable, image 104/8. Partially calcified nodule within the central aspect of the left lower lobe measures 0.7 cm, image 62/8. Unchanged.  Upper Abdomen: Imaged portions of the upper abdomen show a 3.7 cm infrarenal abdominal aortic aneurysm. No acute findings noted within the imaged portions of the upper abdomen.  Musculoskeletal: Advanced degenerative change is noted involving the left glenohumeral joint. Coarsened trabecular markings involving the left scapula is unchanged from previous exam in may represent changes secondary to left chest wall radiation. No suspicious bone lesions. No acute abnormality.  IMPRESSION: 1. Stable appearance of partially calcified subpleural mass along the major fissure in the right middle lobe compatible biopsy-proven amyloidosis. Other calcified nodules are unchanged. 2. Similar appearance of chronic fibrosis, scarring and volume loss of the lingula and medial left upper lobe. 3. Aortic atherosclerosis. Three vessel coronary artery atherosclerotic calcifications noted. 4. Stable 4.4 cm ascending thoracic aortic aneurysm. Recommend annual imaging followup by CTA or MRA. This recommendation follows 2010 ACCF/AHA/AATS/ACR/ASA/SCA/SCAI/SIR/STS/SVM Guidelines for the Diagnosis and Management of Patients with Thoracic Aortic Disease. Circulation. 2010; 121: T024-O973. Aortic aneurysm NOS (ICD10-I71.9) Aortic aneurysm NOS (ICD10-I71.9). 5. Infrarenal abdominal aortic aneurysm measures 3.7 cm. Recommend followup by ultrasound in 2 years. This recommendation follows ACR consensus guidelines: White Paper of the ACR Incidental Findings Committee II on Vascular Findings. J Am Coll Radiol  2013; 10:789-794. Aortic Atherosclerosis (ICD10-I70.0). 6. Coronary artery calcifications noted.  Aortic Atherosclerosis (ICD10-I70.0).   Electronically Signed   By: Kerby Moors M.D.   On: 06/07/2020 15:44 I personally reviewed the CT images and concur with the findings noted above  Impression: Leslie Carr  is an 86 year old woman with a history of hypertension, hyperlipidemia, coronary disease, stents, atrial fibrillation, heart murmur, mild to moderate aortic stenosis, thoracic aortic atherosclerosis, ascending aortic aneurysm, infrarenal abdominal aortic aneurysm, stroke, amyloid nodule the right middle lobe, and falls.  Thoracic aortic atherosclerosis/ascending aneurysm-aneurysm more accurately measured at 4.4 cm.  Has been stable over time.  She has extensive calcification of the ascending aorta arch and descending and abdominal aorta.  She has a porcelain ascending aorta.  Unlikely to ever be a candidate for surgical repair.  Will change to annual follow-up.  Hypertension-blood pressure reasonably well controlled in setting of severe calcified atherosclerotic disease.  Infrarenal abdominal aneurysm-measured 3.7 cm, not completely visualized.  I will plan to do the abdominal aorta with her next CT.  She does not need contrast because of the calcification which clearly outlines the size of the aorta.  Chest pain-she has intermittent pressure sensation substernally.  This does not happen every day.  Is not necessarily associated with exertion.  In fact, at times it improves when she gets up and walks around.  I did caution her  that does not rule out a cardiac source.  If this were to become more frequent or more severe or not be relieved right away, she should contact Dr. Ellyn Hack.  Plan: Return in 1 year with CT of chest and abdomen to follow-up ascending aneurysm and infrarenal abdominal aortic aneurysm.  Melrose Nakayama, MD [facility] [phone]

## 2020-09-29 DIAGNOSIS — R000 Tachycardia, unspecified: Secondary | ICD-10-CM | POA: Diagnosis not present

## 2020-09-29 DIAGNOSIS — R0789 Other chest pain: Secondary | ICD-10-CM | POA: Diagnosis not present

## 2020-09-29 DIAGNOSIS — R079 Chest pain, unspecified: Secondary | ICD-10-CM | POA: Diagnosis not present

## 2020-10-15 DIAGNOSIS — I480 Paroxysmal atrial fibrillation: Secondary | ICD-10-CM | POA: Diagnosis not present

## 2020-10-15 DIAGNOSIS — Z23 Encounter for immunization: Secondary | ICD-10-CM | POA: Diagnosis not present

## 2020-10-15 DIAGNOSIS — Z8673 Personal history of transient ischemic attack (TIA), and cerebral infarction without residual deficits: Secondary | ICD-10-CM | POA: Diagnosis not present

## 2020-10-29 DIAGNOSIS — N189 Chronic kidney disease, unspecified: Secondary | ICD-10-CM | POA: Diagnosis not present

## 2020-10-29 DIAGNOSIS — E785 Hyperlipidemia, unspecified: Secondary | ICD-10-CM | POA: Diagnosis not present

## 2020-10-29 DIAGNOSIS — N1832 Chronic kidney disease, stage 3b: Secondary | ICD-10-CM | POA: Diagnosis not present

## 2020-10-29 DIAGNOSIS — M899 Disorder of bone, unspecified: Secondary | ICD-10-CM | POA: Diagnosis not present

## 2020-10-29 DIAGNOSIS — I509 Heart failure, unspecified: Secondary | ICD-10-CM | POA: Diagnosis not present

## 2020-10-29 DIAGNOSIS — I712 Thoracic aortic aneurysm, without rupture: Secondary | ICD-10-CM | POA: Diagnosis not present

## 2020-11-05 ENCOUNTER — Other Ambulatory Visit: Payer: Self-pay | Admitting: Nephrology

## 2020-11-05 DIAGNOSIS — N1832 Chronic kidney disease, stage 3b: Secondary | ICD-10-CM

## 2020-11-05 DIAGNOSIS — M899 Disorder of bone, unspecified: Secondary | ICD-10-CM

## 2020-11-21 DIAGNOSIS — Z23 Encounter for immunization: Secondary | ICD-10-CM | POA: Diagnosis not present

## 2020-11-26 ENCOUNTER — Ambulatory Visit
Admission: RE | Admit: 2020-11-26 | Discharge: 2020-11-26 | Disposition: A | Discharge status: Home / Self Care | Payer: Medicare Other | Source: Ambulatory Visit | Attending: Nephrology | Admitting: Nephrology

## 2020-11-26 DIAGNOSIS — N1832 Chronic kidney disease, stage 3b: Secondary | ICD-10-CM

## 2020-11-26 DIAGNOSIS — M899 Disorder of bone, unspecified: Secondary | ICD-10-CM

## 2020-11-26 DIAGNOSIS — N183 Chronic kidney disease, stage 3 unspecified: Secondary | ICD-10-CM | POA: Diagnosis not present

## 2021-02-26 DIAGNOSIS — I509 Heart failure, unspecified: Secondary | ICD-10-CM | POA: Diagnosis not present

## 2021-02-26 DIAGNOSIS — N2581 Secondary hyperparathyroidism of renal origin: Secondary | ICD-10-CM | POA: Diagnosis not present

## 2021-02-26 DIAGNOSIS — I701 Atherosclerosis of renal artery: Secondary | ICD-10-CM | POA: Diagnosis not present

## 2021-02-26 DIAGNOSIS — N1832 Chronic kidney disease, stage 3b: Secondary | ICD-10-CM | POA: Diagnosis not present

## 2021-02-26 DIAGNOSIS — I712 Thoracic aortic aneurysm, without rupture: Secondary | ICD-10-CM | POA: Diagnosis not present

## 2021-04-23 DIAGNOSIS — E785 Hyperlipidemia, unspecified: Secondary | ICD-10-CM | POA: Diagnosis not present

## 2021-04-23 DIAGNOSIS — G3184 Mild cognitive impairment, so stated: Secondary | ICD-10-CM | POA: Diagnosis not present

## 2021-04-25 DIAGNOSIS — G3184 Mild cognitive impairment, so stated: Secondary | ICD-10-CM | POA: Diagnosis not present

## 2021-04-25 DIAGNOSIS — E785 Hyperlipidemia, unspecified: Secondary | ICD-10-CM | POA: Diagnosis not present

## 2021-05-27 ENCOUNTER — Emergency Department (HOSPITAL_COMMUNITY): Payer: Medicare Other

## 2021-05-27 ENCOUNTER — Encounter (HOSPITAL_COMMUNITY): Payer: Self-pay | Admitting: Emergency Medicine

## 2021-05-27 ENCOUNTER — Emergency Department (HOSPITAL_COMMUNITY)
Admission: EM | Admit: 2021-05-27 | Discharge: 2021-05-27 | Disposition: A | Discharge status: Home / Self Care | Payer: Medicare Other | Attending: Emergency Medicine | Admitting: Emergency Medicine

## 2021-05-27 ENCOUNTER — Other Ambulatory Visit: Payer: Self-pay

## 2021-05-27 DIAGNOSIS — R1030 Lower abdominal pain, unspecified: Secondary | ICD-10-CM | POA: Insufficient documentation

## 2021-05-27 DIAGNOSIS — Z853 Personal history of malignant neoplasm of breast: Secondary | ICD-10-CM | POA: Diagnosis not present

## 2021-05-27 DIAGNOSIS — I959 Hypotension, unspecified: Secondary | ICD-10-CM | POA: Diagnosis not present

## 2021-05-27 DIAGNOSIS — I110 Hypertensive heart disease with heart failure: Secondary | ICD-10-CM | POA: Insufficient documentation

## 2021-05-27 DIAGNOSIS — I10 Essential (primary) hypertension: Secondary | ICD-10-CM | POA: Diagnosis not present

## 2021-05-27 DIAGNOSIS — I5032 Chronic diastolic (congestive) heart failure: Secondary | ICD-10-CM | POA: Diagnosis not present

## 2021-05-27 DIAGNOSIS — Z7901 Long term (current) use of anticoagulants: Secondary | ICD-10-CM | POA: Diagnosis not present

## 2021-05-27 DIAGNOSIS — I2510 Atherosclerotic heart disease of native coronary artery without angina pectoris: Secondary | ICD-10-CM | POA: Insufficient documentation

## 2021-05-27 DIAGNOSIS — R0789 Other chest pain: Secondary | ICD-10-CM | POA: Diagnosis not present

## 2021-05-27 DIAGNOSIS — R079 Chest pain, unspecified: Secondary | ICD-10-CM

## 2021-05-27 DIAGNOSIS — I480 Paroxysmal atrial fibrillation: Secondary | ICD-10-CM | POA: Insufficient documentation

## 2021-05-27 DIAGNOSIS — Z955 Presence of coronary angioplasty implant and graft: Secondary | ICD-10-CM | POA: Insufficient documentation

## 2021-05-27 DIAGNOSIS — Z87891 Personal history of nicotine dependence: Secondary | ICD-10-CM | POA: Insufficient documentation

## 2021-05-27 DIAGNOSIS — Z7982 Long term (current) use of aspirin: Secondary | ICD-10-CM | POA: Diagnosis not present

## 2021-05-27 DIAGNOSIS — R0902 Hypoxemia: Secondary | ICD-10-CM | POA: Diagnosis not present

## 2021-05-27 DIAGNOSIS — R011 Cardiac murmur, unspecified: Secondary | ICD-10-CM | POA: Diagnosis not present

## 2021-05-27 LAB — URINALYSIS, ROUTINE W REFLEX MICROSCOPIC
Bilirubin Urine: NEGATIVE
Glucose, UA: NEGATIVE mg/dL
Hgb urine dipstick: NEGATIVE
Ketones, ur: 5 mg/dL — AB
Nitrite: NEGATIVE
Protein, ur: NEGATIVE mg/dL
Specific Gravity, Urine: 1.017 (ref 1.005–1.030)
pH: 5 (ref 5.0–8.0)

## 2021-05-27 LAB — CBC
HCT: 37.7 % (ref 36.0–46.0)
Hemoglobin: 11.5 g/dL — ABNORMAL LOW (ref 12.0–15.0)
MCH: 28.5 pg (ref 26.0–34.0)
MCHC: 30.5 g/dL (ref 30.0–36.0)
MCV: 93.5 fL (ref 80.0–100.0)
Platelets: 341 10*3/uL (ref 150–400)
RBC: 4.03 MIL/uL (ref 3.87–5.11)
RDW: 12.9 % (ref 11.5–15.5)
WBC: 6.5 10*3/uL (ref 4.0–10.5)
nRBC: 0 % (ref 0.0–0.2)

## 2021-05-27 LAB — COMPREHENSIVE METABOLIC PANEL
ALT: 10 U/L (ref 0–44)
AST: 16 U/L (ref 15–41)
Albumin: 3.5 g/dL (ref 3.5–5.0)
Alkaline Phosphatase: 65 U/L (ref 38–126)
Anion gap: 8 (ref 5–15)
BUN: 19 mg/dL (ref 8–23)
CO2: 26 mmol/L (ref 22–32)
Calcium: 8.7 mg/dL — ABNORMAL LOW (ref 8.9–10.3)
Chloride: 105 mmol/L (ref 98–111)
Creatinine, Ser: 1.36 mg/dL — ABNORMAL HIGH (ref 0.44–1.00)
GFR, Estimated: 38 mL/min — ABNORMAL LOW (ref >60)
Glucose, Bld: 99 mg/dL (ref 70–99)
Potassium: 4.2 mmol/L (ref 3.5–5.1)
Sodium: 139 mmol/L (ref 135–145)
Total Bilirubin: 0.6 mg/dL (ref 0.3–1.2)
Total Protein: 7 g/dL (ref 6.5–8.1)

## 2021-05-27 LAB — BRAIN NATRIURETIC PEPTIDE: B Natriuretic Peptide: 239 pg/mL — ABNORMAL HIGH (ref 0.0–100.0)

## 2021-05-27 LAB — TROPONIN I (HIGH SENSITIVITY)
Troponin I (High Sensitivity): 11 ng/L (ref <18)
Troponin I (High Sensitivity): 10 ng/L (ref <18)

## 2021-05-27 LAB — PROTIME-INR
INR: 1.2 (ref 0.8–1.2)
Prothrombin Time: 15.2 seconds (ref 11.4–15.2)

## 2021-05-27 NOTE — ED Triage Notes (Addendum)
Pt arrives via [facility] from home with intermittant chest pain x1 wk. The pt is on a blood thinner. Pt is a poor historian.  Pt describes the pain as pressure that radiates to her neck at times.

## 2021-05-27 NOTE — Discharge Instructions (Addendum)
You are seen in the ER today for your chest pain, your physical exam, vital signs, blood work, EKG, chest x-ray were very reassuring.  There is no sign of UTI.  While the exact cause of your pain remains unclear, there is not appear to be any emergent problem at this time.  Please follow closely with your cardiologist, call them first thing this afternoon to schedule for follow-up appointment.  Return to the emergency room if you develop any worsening chest pain, shortness of breath, palpitations, nausea or vomiting does not stop, or any other new severe symptoms.

## 2021-05-27 NOTE — ED Provider Notes (Signed)
[facility] EMERGENCY DEPARTMENT Provider Note   CSN: [id number] Arrival date & time: 05/27/21  6734     History Chief Complaint  Patient presents with  . Chest Pain    Leslie Carr is a 87 y.o. female who presents via EMS for intermittent central chest pressure and pain x1 week.  Patient is anticoagulated on Eliquis.  Patient is an extremely poor historian, unable to give any further insight into her symptoms, answering "will I do not know I just cannot say" to most questions regarding the nature of her pain, onset, and associated factors.  She does endorse that her left shoulder has been hurting, however states that she feels it is secondary to recent activity she has been doing, and is unable to give insight into whether or not it is exacerbated during the episodes of chest pain she is experiencing.  Level 5 caveat due to patient's underlying mental status.  I personally read this patient's medical record.  She has history of CVA and paroxysmal A. fib as well as NSTEMI in the past with  left heart catheterization and stenting.  Anticoagulated with Eliquis. Patient's daughter is supposed to be her presenting to the emergency department, will attempt to obtain collateral information for her when she arrives.  HPI     Past Medical History:  Diagnosis Date  . Acute metabolic encephalopathy 1/93/7902  . Breast cancer ([place])   . CHF (congestive heart failure) ([place])   . Coronary artery disease due to calcified coronary lesion 09/2018   ACS PCI of OM2 on 09/21/2018 followed by staged orbital atherectomy based PCI of ostial to distal RCA.  (Details in Surgical History)  . Diastolic dysfunction with chronic heart failure ([place])   . History of bronchitis   . Hypercholesteremia   . Mass of middle lobe of right lung   . Mild aortic stenosis by prior echocardiogram 09/2018   Mean gradient 14 million mercury, peak 24 mmHg  . Numbness and tingling in left hand   . Pleural effusion on left    . Shortness of breath dyspnea    "only going up a flight of stairs"  . Stroke [facility]) 2014   left sided weakness - mainly left hand  . TIA (transient ischemic attack) 01/2020    Patient Active Problem List   Diagnosis Date Noted  . TIA (transient ischemic attack) 02/04/2020  . Esophagitis   . AAA (abdominal aortic aneurysm) ([place]) 04/19/2019  . Gastrointestinal hemorrhage   . NSTEMI (non-ST elevated myocardial infarction) ([place]) 09/21/2018  . Unstable angina ([place]) 09/19/2018  . Ascending aortic aneurysm ([place]) 09/19/2018  . Chronic diastolic (congestive) heart failure ([place]) 09/19/2018  . Coronary artery disease involving native heart without angina pectoris 09/19/2018  . CVA (cerebral vascular accident) ([place]) 04/22/2016  . PAF (paroxysmal atrial fibrillation) ([place]); CHA2DS2-VASc Score = 7.  Now on DOAC -after recurrent TIA (2021) 04/22/2016  . Mild aortic stenosis by prior echocardiogram 04/22/2016  . Recurrent pleural effusion on left 04/17/2016  . Recurrent left pleural effusion 04/09/2016  . Lung nodule, solitary 04/09/2016  . Status post CVA 04/09/2016  . Hyperlipidemia 04/09/2016  . History of breast cancer 04/09/2016    Past Surgical History:  Procedure Laterality Date  . ABDOMINAL HYSTERECTOMY  1976  . CATARACT EXTRACTION Right   . COLONOSCOPY    . CORONARY ANGIOPLASTY WITH STENT PLACEMENT    . CORONARY ATHERECTOMY N/A 09/24/2018   Procedure: CORONARY ATHERECTOMY-WITH CORONARY STENT PLACEMENT;  Surgeon: Leonie Man, MD;  Location: [facility] INVASIVE CV LAB;; staged PCI: Ostial RCA 70%, prox-mid RCA 70% and mid RCA 95 and 50%. ->  Orbital atherectomy followed by 3 overlapping DES stents (proximal to distal): Xience Sierra 2.75 mm a 28 mm (2.9 mm), 2.5 mm x 33 mm (2.8 mm), 2.25 mm a 12 mm (tapered from 2.6 to 2.4 mm)  . CORONARY STENT INTERVENTION N/A 09/21/2018   Procedure: CORONARY STENT INTERVENTION;  Surgeon: Leonie Man, MD;  Location: [place] CV LAB;  Service:  Cardiovascular;; Ostial OM2 85% (DES PCI, Xience Sierra 2.5 mm x 18 mm-minimal residual stenosis)  . DECORTICATION Left 04/17/2016   Procedure: Visceral Pleural DECORTICATION;  Surgeon: Melrose Nakayama, MD;  Location: [place];  Service: Thoracic;  Laterality: Left;  . ESOPHAGOGASTRODUODENOSCOPY N/A 04/20/2019   Procedure: ESOPHAGOGASTRODUODENOSCOPY (EGD);  Surgeon: Rogene Houston, MD;  Location[facility];  Service: Endoscopy;  Laterality: N/A;  APC- circumferential  . FOOT SURGERY Right    "bone spur removal"  . LEFT HEART CATH AND CORONARY ANGIOGRAPHY N/A 09/21/2018   Procedure: LEFT HEART CATH AND CORONARY ANGIOGRAPHY;  Surgeon: Leonie Man, MD;  Location: [place] CV LAB;  Service: Cardiovascular;; Ostial OM2 85% (DES PCI), dLCx 45%.  Lesion #2-3: Ostial RCA 70%, prox-mid RCA 70% and mid RCA 95 and 50%.  Distal RCA 30%, ostial RPDA 30%.  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 MASTECTOMY Left 1985  . PLEURAL BIOPSY Left 04/17/2016   Procedure:  Left PLEURAL BIOPSY;  Surgeon: Melrose Nakayama, MD;  Location: [place];  Service: Thoracic;  Laterality: Left;  . PLEURAL EFFUSION DRAINAGE Left 04/17/2016   Procedure: DRAINAGE OF Left PLEURAL EFFUSION;  Surgeon: Melrose Nakayama, MD;  Location: [place];  Service: Thoracic;  Laterality: Left;  . TONSILLECTOMY    . TRANSTHORACIC ECHOCARDIOGRAM  09/21/2018    EF 60 to 65%.  GR 1 DD.  Mild aortic s stenosis (mean gradient 14 mmHg, peak gradient 24 mmHg).  Mild LA dilation.  Lipomatous hypertrophy of intra-atrial septum.;; b) February 2021: EF 60 to 65%.  No R WMA.  Mild concentric LVH.  GR 1 DD.  Normal RV function.  Mild LA dilation.  Mild calcific aortic stenosis (mean gradient 10 mmHg-but visually appears to be worse).  No change  . VIDEO ASSISTED THORACOSCOPY Left 04/17/2016   Procedure:  Left VIDEO ASSISTED THORACOSCOPY with Drainage of Pleural Effusion, Pleural Biopsy, Visceral Pleural Decortication and  Placement of On-Q pain pump;  Surgeon: Melrose Nakayama, MD;  Location: [place];  Service: Thoracic;  Laterality: Left;     OB History   No obstetric history on file.     Family History  Problem Relation Age of Onset  . Hypertension Mother   . Hypertension Father   . Heart disease Neg Hx   . Heart attack Neg Hx   . Hyperlipidemia Neg Hx   . Heart failure Neg Hx     Social History   Tobacco Use  . Smoking status: Former Research scientist (life sciences)  . Smokeless tobacco: Never Used  . Tobacco comment: Quit over 40 years ago  Vaping Use  . Vaping Use: Never used  Substance Use Topics  . Alcohol use: No    Alcohol/week: 0.0 standard drinks    Comment: occas  . Drug use: No    Home Medications Prior to Admission medications   Medication Sig Start Date End Date Taking? Authorizing Provider  acetaminophen (TYLENOL) 500 MG tablet Take 500 mg  by mouth as needed. For pain when needed.   Yes [provider]  apixaban (ELIQUIS) 5 MG TABS tablet Take 1 tablet (5 mg total) by mouth 2 (two) times daily. 02/06/20  Yes Geradine Girt, DO  aspirin EC 81 MG EC tablet Take 1 tablet (81 mg total) by mouth daily. 02/06/20  Yes Eulogio Bear U, DO  docusate sodium (COLACE) 100 MG capsule Take 100 mg by mouth every evening.   Yes [provider]  pravastatin (PRAVACHOL) 40 MG tablet Take 40 mg by mouth daily. 01/02/20  Yes [provider]    Allergies    Lipitor [atorvastatin]  Review of Systems   Review of Systems  Constitutional: Negative.   HENT: Negative.   Respiratory: Negative.  Negative for apnea, cough, choking, chest tightness, shortness of breath, wheezing and stridor.   Cardiovascular: Positive for chest pain. Negative for palpitations and leg swelling.  Gastrointestinal: Negative.   Genitourinary: Negative.   Musculoskeletal: Negative.   Skin: Negative.   Neurological: Negative.   Hematological: Bruises/bleeds easily.    Physical Exam Updated Vital Signs BP (!)  142/68   Pulse 60   Temp 98.2 F (36.8 C) (Oral)   Resp 20   Ht 5\' 2"  (1.575 m)   Wt 62.6 kg   SpO2 98%   BMI 25.24 kg/m   Physical Exam Vitals and nursing note reviewed.  Constitutional:      Appearance: She is overweight. She is not toxic-appearing.  HENT:     Head: Normocephalic and atraumatic.     Nose: Nose normal.     Mouth/Throat:     Mouth: Mucous membranes are moist.     Pharynx: Oropharynx is clear. Uvula midline. No oropharyngeal exudate, posterior oropharyngeal erythema or uvula swelling.     Tonsils: No tonsillar exudate.  Eyes:     General: Lids are normal. Vision grossly intact.        Right eye: No discharge.        Left eye: No discharge.     Extraocular Movements: Extraocular movements intact.     Conjunctiva/sclera: Conjunctivae normal.     Pupils: Pupils are equal, round, and reactive to light.  Neck:     Trachea: Trachea and phonation normal.  Cardiovascular:     Rate and Rhythm: Normal rate and regular rhythm.     Pulses: Normal pulses.     Heart sounds: Murmur heard.   Systolic murmur is present with a grade of 4/6.   Pulmonary:     Effort: Pulmonary effort is normal. No tachypnea, bradypnea, accessory muscle usage, prolonged expiration or respiratory distress.     Breath sounds: Normal breath sounds. No wheezing or rales.  Chest:     Chest wall: No mass, lacerations, deformity, swelling, tenderness, crepitus or edema.  Abdominal:     General: Bowel sounds are normal. There is no distension.     Palpations: Abdomen is soft.     Tenderness: There is abdominal tenderness in the suprapubic area. There is no right CVA tenderness, left CVA tenderness, guarding or rebound. Negative signs include Murphy's sign.  Musculoskeletal:        General: No deformity.     Cervical back: Normal range of motion and neck supple. No edema, rigidity or crepitus. No pain with movement or spinous process tenderness.     Right lower leg: No edema.     Left lower leg:  No edema.  Lymphadenopathy:     Cervical: No cervical adenopathy.  Skin:    General: Skin is warm and dry.     Capillary Refill: Capillary refill takes less than 2 seconds.     Coloration: Skin is not jaundiced.  Neurological:     Mental Status: She is alert. Mental status is at baseline.     Sensory: Sensation is intact.     Motor: Motor function is intact.  Psychiatric:        Mood and Affect: Mood normal.     ED Results / Procedures / Treatments   Labs (all labs ordered are listed, but only abnormal results are displayed) Labs Reviewed  CBC - Abnormal; Notable for the following components:      Result Value   Hemoglobin 11.5 (*)    All other components within normal limits  COMPREHENSIVE METABOLIC PANEL - Abnormal; Notable for the following components:   Creatinine, Ser 1.36 (*)    Calcium 8.7 (*)    GFR, Estimated 38 (*)    All other components within normal limits  BRAIN NATRIURETIC PEPTIDE - Abnormal; Notable for the following components:   B Natriuretic Peptide 239.0 (*)    All other components within normal limits  URINALYSIS, ROUTINE W REFLEX MICROSCOPIC - Abnormal; Notable for the following components:   Ketones, ur 5 (*)    Leukocytes,Ua TRACE (*)    Bacteria, UA RARE (*)    All other components within normal limits  URINE CULTURE  PROTIME-INR  TROPONIN I (HIGH SENSITIVITY)  TROPONIN I (HIGH SENSITIVITY)    EKG EKG Interpretation  Date/Time:  Monday May 27 2021 09:21:25 EDT Ventricular Rate:  72 PR Interval:  187 QRS Duration: 90 QT Interval:  430 QTC Calculation: 471 R Axis:   -25 Text Interpretation: Sinus rhythm Abnormal R-wave progression, early transition Probable inferior infarct, age indeterminate No acute changes No significant change since last tracing Confirmed by Varney Biles ([id number]) on 05/27/2021 11:32:45 AM   Radiology DG Chest Port 1 View  Result Date: 05/27/2021 CLINICAL DATA:  Chest pain EXAM: PORTABLE CHEST 1 VIEW COMPARISON:   February 25, 2020 chest radiograph; chest CT June 07, 2020 FINDINGS: Mass arising from the right minor fissure again noted, measuring 3.2 x 1.7 cm, stable. No edema or airspace opacity. Mild scarring left mid lung. Heart is upper normal in size with pulmonary vascularity normal. No adenopathy. There is aortic atherosclerosis. There is advanced arthropathy in the left shoulder. IMPRESSION: Stable mass arising from the minor fissure on the right. No edema or airspace opacity. Mild scarring left mid lung. Heart upper normal in size. Aortic Atherosclerosis (ICD10-I70.0). Electronically Signed   By: Lowella Grip III M.D.   On: 05/27/2021 09:47    Procedures Procedures   Medications Ordered in ED Medications - No data to display  ED Course  I have reviewed the triage vital signs and the nursing notes.  Pertinent labs & imaging results that were available during my care of the patient were reviewed by me and considered in my medical decision making (see chart for details).    MDM Rules/Calculators/A&P                         87 year old female  presents with concern for central chest pressure intermittently for several weeks, but worse this week.  Differential diagnosis for this patient symptoms includes is limited to ACS, PE, pleural effusion, pneumonia, musculoskeletal strain, GERD.  Vital signs are normal on intake.  Cardiopulmonary exam is significant for significant systolic murmur,  according to the patient known, abdominal exam is significant for suprapubic tenderness to palpation.  Patient is neurovascular intact all 4 extremities. She is CP free at this time.  CBC with baseline anemia with hemoglobin of 11.5, CMP With baseline creatinine of 1.3.  UA with mild ketonuria and rare bacteria, not convincing for infection, BNP at patient's baseline of 239 and troponin is normal, 11.  Delta troponin normal, 10.  Chest x-ray negative for acute cardiopulmonary disease, stable right lung mass and EKG  with sinus rhythm without acute ischemic change.  Doubt ACS given normal troponin x2 and reassuring EKG.  Evidence of pleural effusion or pneumonia on chest x-ray, and blood work is reassuring against infectious etiology.  Clinically, doubt PE given patient is not tachycardic, tachypneic, or short of breath; additionally she is already anticoagulated on Eliquis, and has no recent risk factors for clot.  Patient reevaluated and remains asymptomatic at this time.  Given reassuring work-up, vital signs, and physical exam, no further work-up warranted mediastinum.  Recommend she follow-up closely with her cardiologist.  Patient and her daughter, Santiago Glad, who is at the bedside, voiced understanding of her medical evaluation and treatment plan.  Each of their questions was answered to their expressed satisfaction.  Strict return precautions are given.  Patient is well-appearing, stable, appropriate for discharge at this time.  This chart was dictated using voice recognition software, Dragon. Despite the best efforts of this provider to proofread and correct errors, errors may still occur which can change documentation meaning.  Final Clinical Impression(s) / ED Diagnoses Final diagnoses:  Chest pain, unspecified type    Rx / DC Orders ED Discharge Orders    None       Emeline Darling, PA-C 05/27/21 1841    Varney Biles, MD 05/28/21 1545

## 2021-05-27 NOTE — ED Notes (Signed)
Warm blanket given

## 2021-05-29 LAB — URINE CULTURE

## 2021-06-21 DIAGNOSIS — Z20822 Contact with and (suspected) exposure to covid-19: Secondary | ICD-10-CM | POA: Diagnosis not present

## 2021-06-25 ENCOUNTER — Other Ambulatory Visit: Payer: Self-pay | Admitting: *Deleted

## 2021-06-25 DIAGNOSIS — I7121 Aneurysm of the ascending aorta, without rupture: Secondary | ICD-10-CM

## 2021-06-25 DIAGNOSIS — I714 Abdominal aortic aneurysm, without rupture, unspecified: Secondary | ICD-10-CM

## 2021-06-25 DIAGNOSIS — I712 Thoracic aortic aneurysm, without rupture, unspecified: Secondary | ICD-10-CM

## 2021-07-25 ENCOUNTER — Other Ambulatory Visit: Payer: Medicare Other

## 2021-07-30 ENCOUNTER — Ambulatory Visit: Payer: Medicare Other | Admitting: Thoracic Surgery (Cardiothoracic Vascular Surgery)

## 2021-08-12 ENCOUNTER — Ambulatory Visit
Admission: RE | Admit: 2021-08-12 | Discharge: 2021-08-12 | Disposition: A | Discharge status: Home / Self Care | Payer: Medicare Other | Source: Ambulatory Visit | Attending: Thoracic Surgery (Cardiothoracic Vascular Surgery) | Admitting: Thoracic Surgery (Cardiothoracic Vascular Surgery)

## 2021-08-12 ENCOUNTER — Other Ambulatory Visit: Payer: Self-pay

## 2021-08-12 DIAGNOSIS — R918 Other nonspecific abnormal finding of lung field: Secondary | ICD-10-CM | POA: Diagnosis not present

## 2021-08-12 DIAGNOSIS — I358 Other nonrheumatic aortic valve disorders: Secondary | ICD-10-CM | POA: Diagnosis not present

## 2021-08-12 DIAGNOSIS — I7121 Aneurysm of the ascending aorta, without rupture: Secondary | ICD-10-CM

## 2021-08-12 DIAGNOSIS — I712 Thoracic aortic aneurysm, without rupture, unspecified: Secondary | ICD-10-CM

## 2021-08-12 DIAGNOSIS — I714 Abdominal aortic aneurysm, without rupture, unspecified: Secondary | ICD-10-CM

## 2021-08-12 DIAGNOSIS — R911 Solitary pulmonary nodule: Secondary | ICD-10-CM | POA: Diagnosis not present

## 2021-08-12 DIAGNOSIS — J479 Bronchiectasis, uncomplicated: Secondary | ICD-10-CM | POA: Diagnosis not present

## 2021-08-12 DIAGNOSIS — I700 Atherosclerosis of aorta: Secondary | ICD-10-CM | POA: Diagnosis not present

## 2021-08-12 DIAGNOSIS — J90 Pleural effusion, not elsewhere classified: Secondary | ICD-10-CM | POA: Diagnosis not present

## 2021-08-12 DIAGNOSIS — Z853 Personal history of malignant neoplasm of breast: Secondary | ICD-10-CM | POA: Diagnosis not present

## 2021-08-27 ENCOUNTER — Ambulatory Visit: Payer: Medicare Other | Admitting: Thoracic Surgery (Cardiothoracic Vascular Surgery)

## 2021-09-03 ENCOUNTER — Encounter: Payer: Self-pay | Admitting: Surgical

## 2021-09-03 ENCOUNTER — Ambulatory Visit (INDEPENDENT_AMBULATORY_CARE_PROVIDER_SITE_OTHER): Payer: Medicare Other | Admitting: Surgical

## 2021-09-03 ENCOUNTER — Other Ambulatory Visit: Payer: Self-pay

## 2021-09-03 VITALS — BP 114/70 | HR 75 | Resp 20 | Ht 62.0 in | Wt 134.0 lb

## 2021-09-03 DIAGNOSIS — I712 Thoracic aortic aneurysm, without rupture, unspecified: Secondary | ICD-10-CM

## 2021-09-03 NOTE — Progress Notes (Signed)
Acute Office Visit  Subjective:    Patient ID: Leslie Carr, female    DOB: 1931-02-05, 87 y.o.   MRN: [id number]  Chief Complaint  Patient presents with   Thoracic Aortic Aneurysm    Yearly surveillance with CT chest     HPI Patient is in today for ongoing surveillance of her thoracic aneurysm and lung nodules.  Please see the full report below.  The patient is not a surgical candidate regardless of the findings.  She overall feels well and has fairly active going to church and spending time with her family.  She does have occasions where she feels somewhat weak.  She denies chest pain or significant shortness of breath.  She does have occasional swelling in her lower extremities but this is intermittent and mild.  Past Medical History:  Diagnosis Date   Acute metabolic encephalopathy 8/54/6270   Breast cancer ([place])    CHF (congestive heart failure) ([place])    Coronary artery disease due to calcified coronary lesion 09/2018   ACS PCI of OM2 on 09/21/2018 followed by staged orbital atherectomy based PCI of ostial to distal RCA.  (Details in Surgical History)   Diastolic dysfunction with chronic heart failure (HCC)    History of bronchitis    Hypercholesteremia    Mass of middle lobe of right lung    Mild aortic stenosis by prior echocardiogram 09/2018   Mean gradient 14 million mercury, peak 24 mmHg   Numbness and tingling in left hand    Pleural effusion on left    Shortness of breath dyspnea    "only going up a flight of stairs"   Stroke [facility]) 2014   left sided weakness - mainly left hand   TIA (transient ischemic attack) 01/2020    Past Surgical History:  Procedure Laterality Date   ABDOMINAL HYSTERECTOMY  1976   CATARACT EXTRACTION Right    COLONOSCOPY     CORONARY ANGIOPLASTY WITH STENT PLACEMENT     CORONARY ATHERECTOMY N/A 09/24/2018   Procedure: [place] CORONARY STENT PLACEMENT;  Surgeon: Leonie Man, MD;  Location: [place] [facility];;  staged PCI: Ostial RCA 70%, prox-mid RCA 70% and mid RCA 95 and 50%. ->  Orbital atherectomy followed by 3 overlapping DES stents (proximal to distal): Xience Sierra 2.75 mm a 28 mm (2.9 mm), 2.5 mm x 33 mm (2.8 mm), 2.25 mm a 12 mm (tapered from 2.6 to 2.4 mm)   CORONARY STENT INTERVENTION N/A 09/21/2018   Procedure: CORONARY STENT INTERVENTION;  Surgeon: Leonie Man, MD;  Location: [place] [facility];  Service: Cardiovascular;; Ostial OM2 85% (DES PCI, Xience Sierra 2.5 mm x 18 mm-minimal residual stenosis)   DECORTICATION Left 04/17/2016   Procedure: Visceral Pleural DECORTICATION;  Surgeon: Melrose Nakayama, MD;  Location: [place];  Service: Thoracic;  Laterality: Left;   ESOPHAGOGASTRODUODENOSCOPY N/A 04/20/2019   Procedure: ESOPHAGOGASTRODUODENOSCOPY (EGD);  Surgeon: Rogene Houston, MD;  [facility];  Service: Endoscopy;  Laterality: N/A;  APC- circumferential   FOOT SURGERY Right    "bone spur removal"   LEFT HEART CATH AND CORONARY ANGIOGRAPHY N/A 09/21/2018   Procedure: LEFT HEART CATH AND CORONARY ANGIOGRAPHY;  Surgeon: Leonie Man, MD;  Location: [place] [facility];  Service: Cardiovascular;; Ostial OM2 85% (DES PCI), dLCx 45%.  Lesion #2-3: Ostial RCA 70%, prox-mid RCA 70% and mid RCA 95 and 50%.  Distal RCA 30%, ostial RPDA 30%.  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  GR 1 DD.  Mild aortic s stenosis (mean gradient 14 mmHg, peak gradient 24 mmHg).  Mild LA dilation.  Lipomatous hypertrophy of intra-atrial  septum.;; b) February 2021: EF 60 to 65%.  No R WMA.  Mild concentric LVH.  GR 1 DD.  Normal RV function.  Mild LA dilation.  Mild calcific aortic stenosis (mean gradient 10 mmHg-but visually appears to be worse).  No change   VIDEO ASSISTED THORACOSCOPY Left 04/17/2016   Procedure:  Left VIDEO ASSISTED THORACOSCOPY with Drainage of Pleural Effusion, Pleural Biopsy, Visceral Pleural Decortication and Placement of On-Q pain pump;  Surgeon: Melrose Nakayama, MD;  Location: [facility];  Service: Thoracic;  Laterality: Left;    Family History  Problem Relation Age of Onset   Hypertension Mother    Hypertension Father    Heart disease Neg Hx    Heart attack Neg Hx    Hyperlipidemia Neg Hx    Heart failure Neg Hx     Social History   Socioeconomic History   Marital status: Divorced    Spouse name: Not on file   Number of children: 3   Years of education: 12   Highest education level: Not on file  Occupational History    Comment: retired  Tobacco Use   Smoking status: Former   Smokeless tobacco: Never   Tobacco comments:    Quit over 40 years ago  Vaping Use   Vaping Use: Never used  Substance and Sexual Activity   Alcohol use: No    Alcohol/week: 0.0 standard drinks    Comment: occas   Drug use: No   Sexual activity: Not on file  Other Topics Concern   Not on file  Social History Narrative   03/14/20  Lives below daughter, down stairs apt       Social Determinants of Health   Financial Resource Strain: Not on file  Food Insecurity: Not on file  Transportation Needs: Not on file  Physical Activity: Not on file  Stress: Not on file  Social Connections: Not on file  Intimate Partner Violence: Not on file    Outpatient Medications Prior to Visit  Medication Sig Dispense Refill   acetaminophen (TYLENOL) 500 MG tablet Take 500 mg by mouth as needed. For pain when needed.     apixaban (ELIQUIS) 5 MG TABS tablet Take 1 tablet (5 mg total) by mouth 2 (two) times daily. 60  tablet 0   aspirin EC 81 MG EC tablet Take 1 tablet (81 mg total) by mouth daily.     docusate sodium (COLACE) 100 MG capsule Take 100 mg by mouth every evening.     pravastatin (PRAVACHOL) 40 MG tablet Take 40 mg by mouth daily.     No facility-administered medications prior to visit.    Allergies  Allergen Reactions   Lipitor [Atorvastatin] Other (See Comments)    Dizzy and make her head feel huge          Objective:  Physical Exam Constitutional:      General: She is not in acute distress.    Appearance: She is normal weight. She is not ill-appearing.  HENT:     Head: Normocephalic and atraumatic.  Cardiovascular:     Rate and Rhythm: Normal rate and regular rhythm.     Heart sounds: Murmur heard.     Comments: Soft aortic systolic murmur Pulmonary:     Effort: Pulmonary effort is normal.     Breath sounds: Normal breath sounds.  Abdominal:     General: Abdomen is flat.     Palpations: Abdomen is soft.  Musculoskeletal:        General: No swelling or tenderness.     Right lower leg: No edema.     Left lower leg: No edema.  Skin:    General: Skin is warm and dry.  Neurological:     Mental Status: She is alert and oriented to person, place, and time.  Psychiatric:        Mood and Affect: Mood normal.        Behavior: Behavior normal.        Thought Content: Thought content normal.        Judgment: Judgment normal.    BP 114/70 (BP Location: Right Arm, Patient Position: Sitting)   Pulse 75   Resp 20   Ht '5\' 2"'  (1.575 m)   Wt 134 lb (60.8 kg)   SpO2 96% Comment: RA  BMI 24.51 kg/m  Wt Readings from Last 3 Encounters:  09/03/21 134 lb (60.8 kg)  05/27/21 138 lb (62.6 kg)  07/24/20 140 lb (63.5 kg)    Health Maintenance Due  Topic Date Due   COVID-19 Vaccine (1) Never done   TETANUS/TDAP  Never done   Zoster Vaccines- Shingrix (1 of 2) Never done   PNA vac Low Risk Adult (2 of 2 - PCV13) 04/24/2017   INFLUENZA VACCINE  07/22/2021    There are no  preventive care reminders to display for this patient.   Lab Results  Component Value Date   TSH 2.961 04/20/2016   Lab Results  Component Value Date   WBC 6.5 05/27/2021   HGB 11.5 (L) 05/27/2021   HCT 37.7 05/27/2021   MCV 93.5 05/27/2021   PLT 341 05/27/2021   Lab Results  Component Value Date   NA 139 05/27/2021   K 4.2 05/27/2021   CHLORIDE 103 03/03/2016   CO2 26 05/27/2021   GLUCOSE 99 05/27/2021   BUN 19 05/27/2021   CREATININE 1.36 (H) 05/27/2021   BILITOT 0.6 05/27/2021   ALKPHOS 65 05/27/2021   AST 16 05/27/2021   ALT 10 05/27/2021   PROT 7.0 05/27/2021   ALBUMIN 3.5 05/27/2021   CALCIUM 8.7 (L) 05/27/2021   ANIONGAP 8 05/27/2021   EGFR 46 (L) 03/03/2016   Lab Results  Component Value Date   CHOL 141 02/04/2020   Lab Results  Component Value Date   HDL 48 02/04/2020   Lab Results  Component Value Date   LDLCALC 80 02/04/2020   Lab Results  Component Value Date   TRIG 63 02/04/2020   Lab Results  Component Value Date   CHOLHDL 2.9 02/04/2020   Lab Results  Component Value Date   HGBA1C 5.6 02/04/2020        CT ABDOMEN WO CONTRAST  Result Date: 08/12/2021 CLINICAL DATA:  Follow-up ascending thoracic aortic aneurysm and abdominal aortic aneurysm. History of left breast cancer. EXAM: CT CHEST AND  ABDOMEN WITHOUT CONTRAST TECHNIQUE: Multidetector CT imaging of the chest and abdomen was performed following the standard protocol without intravenous contrast. COMPARISON:  06/07/2020 chest CT. 04/19/2019 chest CT angiogram and CT abdomen/pelvis. FINDINGS: CT CHEST FINDINGS WITHOUT CONTRAST Cardiovascular: Normal heart size. No significant pericardial effusion/thickening. Three-vessel coronary atherosclerosis. Atherosclerotic thoracic aorta with 4.7 cm ascending thoracic aortic aneurysm, previously 4.7 cm using similar measurement technique, stable. Coarse aortic valve calcification is again noted. Aortic root diameter approximately 3.5 cm at the level  of the sinuses of Valsalva. Maximum descending thoracic aortic diameter 3.1 cm. Aortic diameter 3.1 cm at the diaphragmatic hiatus. Normal caliber pulmonary arteries. Mediastinum/Nodes: Stable coarsely calcified 1.2 cm left thyroid nodule. Not clinically significant; no follow-up imaging recommended (ref: J Am Coll Radiol. 2015 Feb;12(2): 143-50). Unremarkable esophagus. No pathologically enlarged axillary, mediastinal or hilar lymph nodes, noting limited sensitivity for the detection of hilar adenopathy on this noncontrast study. Lungs/Pleura: No pneumothorax. No right pleural effusion. Loculated trace medial basilar left pleural effusion is stable. Heterogeneously calcified 3.4 x 2.0 cm posterior right middle lobe lung mass along the minor and major fissures (series 4/image 84), previously 3.3 x 2.0 cm using similar measurement technique, not appreciably changed. Right lower lobe solid 0.7 cm pulmonary nodule (series 4/image 114), slightly increased from 0.6 cm. Apical right upper lobe 0.4 cm solid pulmonary nodule (series 4/image 25), slightly increased from 0.3 cm. Central left lower lobe faintly calcified 0.9 cm nodule (series 4/image 66), previously 0.9 cm using similar measurement technique, stable. Stable bandlike subpleural scarring throughout the medial left upper lobe and lingula with associated volume loss, distortion and mild bronchiectasis. No acute consolidative airspace disease. No new significant pulmonary nodules. Musculoskeletal: No aggressive appearing focal osseous lesions. Advanced left glenohumeral osteoarthritis. Moderate thoracic spondylosis. Left mastectomy. CT ABDOMEN FINDINGS WITHOUT CONTRAST Hepatobiliary: Normal liver with no liver mass. Normal gallbladder with no radiopaque cholelithiasis. No biliary ductal dilatation. Pancreas: Normal, with no mass or duct dilation. Spleen: Normal size. No mass. Adrenals/Urinary Tract: Normal adrenals. No renal stones. No hydronephrosis. No contour  deforming renal masses. Stomach/Bowel: Small hiatal hernia. Otherwise normal nondistended stomach. Visualized small and large bowel is normal caliber, with no bowel wall thickening. Vascular/Lymphatic: Atherosclerotic abdominal aorta with 4.3 cm infrarenal abdominal aortic aneurysm, incompletely visualized on the prior chest CT. No pathologically enlarged lymph nodes in the abdomen. Other: No pneumoperitoneum, ascites or focal fluid collection. Musculoskeletal: No aggressive appearing focal osseous lesions. Marked lower lumbar spondylosis. IMPRESSION: 1. Stable 4.7 cm ascending thoracic aortic aneurysm. Ascending thoracic aortic aneurysm. Recommend semi-annual imaging followup by CTA or MRA and referral to cardiothoracic surgery if not already obtained. This recommendation follows 2010 ACCF/AHA/AATS/ACR/ASA/SCA/SCAI/SIR/STS/SVM Guidelines for the Diagnosis and Management of Patients With Thoracic Aortic Disease. Circulation. 2010; 121: Z308-M578. Aortic aneurysm NOS (ICD10-I71.9). 2. Infrarenal 4.3 cm abdominal aortic aneurysm, incompletely visualized on the prior chest CT. Recommend follow-up every 12 months and vascular consultation. This recommendation follows ACR consensus guidelines: White Paper of the ACR Incidental Findings Committee II on Vascular Findings. J Am Coll Radiol 2013; 10:789-794. 3. Slight growth of two small right pulmonary nodules, largest 0.7 cm, indeterminate. Suggest continued chest CT surveillance in 6 months. 4. Stable partially calcified 3.4 cm posterior right middle lobe lung mass, previously biopsy-proven amyloidosis. 5. Three-vessel coronary atherosclerosis. 6. Small hiatal hernia. 7. Aortic Atherosclerosis (ICD10-I70.0). Electronically Signed   By: Ilona Sorrel M.D.   On: 08/12/2021 13:49   CT CHEST WO CONTRAST  Result Date: 08/12/2021 CLINICAL DATA:  Follow-up ascending thoracic  aortic aneurysm and abdominal aortic aneurysm. History of left breast cancer. EXAM: CT CHEST AND  ABDOMEN WITHOUT CONTRAST TECHNIQUE: Multidetector CT imaging of the chest and abdomen was performed following the standard protocol without intravenous contrast. COMPARISON:  06/07/2020 chest CT. 04/19/2019 chest CT angiogram and CT abdomen/pelvis. FINDINGS: CT CHEST FINDINGS WITHOUT CONTRAST Cardiovascular: Normal heart size. No significant pericardial effusion/thickening. Three-vessel coronary atherosclerosis. Atherosclerotic thoracic aorta with 4.7 cm ascending thoracic aortic aneurysm, previously 4.7 cm using similar measurement technique, stable. Coarse aortic valve calcification is again noted. Aortic root diameter approximately 3.5 cm at the level of the sinuses of Valsalva. Maximum descending thoracic aortic diameter 3.1 cm. Aortic diameter 3.1 cm at the diaphragmatic hiatus. Normal caliber pulmonary arteries. Mediastinum/Nodes: Stable coarsely calcified 1.2 cm left thyroid nodule. Not clinically significant; no follow-up imaging recommended (ref: J Am Coll Radiol. 2015 Feb;12(2): 143-50). Unremarkable esophagus. No pathologically enlarged axillary, mediastinal or hilar lymph nodes, noting limited sensitivity for the detection of hilar adenopathy on this noncontrast study. Lungs/Pleura: No pneumothorax. No right pleural effusion. Loculated trace medial basilar left pleural effusion is stable. Heterogeneously calcified 3.4 x 2.0 cm posterior right middle lobe lung mass along the minor and major fissures (series 4/image 84), previously 3.3 x 2.0 cm using similar measurement technique, not appreciably changed. Right lower lobe solid 0.7 cm pulmonary nodule (series 4/image 114), slightly increased from 0.6 cm. Apical right upper lobe 0.4 cm solid pulmonary nodule (series 4/image 25), slightly increased from 0.3 cm. Central left lower lobe faintly calcified 0.9 cm nodule (series 4/image 66), previously 0.9 cm using similar measurement technique, stable. Stable bandlike subpleural scarring throughout the medial  left upper lobe and lingula with associated volume loss, distortion and mild bronchiectasis. No acute consolidative airspace disease. No new significant pulmonary nodules. Musculoskeletal: No aggressive appearing focal osseous lesions. Advanced left glenohumeral osteoarthritis. Moderate thoracic spondylosis. Left mastectomy. CT ABDOMEN FINDINGS WITHOUT CONTRAST Hepatobiliary: Normal liver with no liver mass. Normal gallbladder with no radiopaque cholelithiasis. No biliary ductal dilatation. Pancreas: Normal, with no mass or duct dilation. Spleen: Normal size. No mass. Adrenals/Urinary Tract: Normal adrenals. No renal stones. No hydronephrosis. No contour deforming renal masses. Stomach/Bowel: Small hiatal hernia. Otherwise normal nondistended stomach. Visualized small and large bowel is normal caliber, with no bowel wall thickening. Vascular/Lymphatic: Atherosclerotic abdominal aorta with 4.3 cm infrarenal abdominal aortic aneurysm, incompletely visualized on the prior chest CT. No pathologically enlarged lymph nodes in the abdomen. Other: No pneumoperitoneum, ascites or focal fluid collection. Musculoskeletal: No aggressive appearing focal osseous lesions. Marked lower lumbar spondylosis. IMPRESSION: 1. Stable 4.7 cm ascending thoracic aortic aneurysm. Ascending thoracic aortic aneurysm. Recommend semi-annual imaging followup by CTA or MRA and referral to cardiothoracic surgery if not already obtained. This recommendation follows 2010 ACCF/AHA/AATS/ACR/ASA/SCA/SCAI/SIR/STS/SVM Guidelines for the Diagnosis and Management of Patients With Thoracic Aortic Disease. Circulation. 2010; 121: U882-C003. Aortic aneurysm NOS (ICD10-I71.9). 2. Infrarenal 4.3 cm abdominal aortic aneurysm, incompletely visualized on the prior chest CT. Recommend follow-up every 12 months and vascular consultation. This recommendation follows ACR consensus guidelines: White Paper of the ACR Incidental Findings Committee II on Vascular Findings.  J Am Coll Radiol 2013; 10:789-794. 3. Slight growth of two small right pulmonary nodules, largest 0.7 cm, indeterminate. Suggest continued chest CT surveillance in 6 months. 4. Stable partially calcified 3.4 cm posterior right middle lobe lung mass, previously biopsy-proven amyloidosis. 5. Three-vessel coronary atherosclerosis. 6. Small hiatal hernia. 7. Aortic Atherosclerosis (ICD10-I70.0). Electronically Signed   By: Ilona Sorrel M.D.   On: 08/12/2021 13:49  ongoing surveillance    Assessment & Plan:   Problem List Items Addressed This Visit   None Visit Diagnoses     Thoracic aortic aneurysm without rupture ([place])    -  Primary        No orders of the defined types were placed in this encounter.  The patient is clinically very stable in regards to worrisome signs/symptoms of thoracic aneurysm.  She is not a surgical candidate.  After discussing with the patient she no longer wants to continue with surveillance scans and has great comfort in her faith.  We will see her again on a as needed basis and as of now we will not continue surveillance scans.  John Giovanni, PA-C

## 2021-09-03 NOTE — Patient Instructions (Signed)
Follow up as needed

## 2022-02-28 DIAGNOSIS — I509 Heart failure, unspecified: Secondary | ICD-10-CM | POA: Diagnosis not present

## 2022-02-28 DIAGNOSIS — N2581 Secondary hyperparathyroidism of renal origin: Secondary | ICD-10-CM | POA: Diagnosis not present

## 2022-02-28 DIAGNOSIS — N1832 Chronic kidney disease, stage 3b: Secondary | ICD-10-CM | POA: Diagnosis not present

## 2022-02-28 DIAGNOSIS — D631 Anemia in chronic kidney disease: Secondary | ICD-10-CM | POA: Diagnosis not present

## 2022-02-28 DIAGNOSIS — I129 Hypertensive chronic kidney disease with stage 1 through stage 4 chronic kidney disease, or unspecified chronic kidney disease: Secondary | ICD-10-CM | POA: Diagnosis not present

## 2022-02-28 DIAGNOSIS — N390 Urinary tract infection, site not specified: Secondary | ICD-10-CM | POA: Diagnosis not present

## 2022-02-28 DIAGNOSIS — I701 Atherosclerosis of renal artery: Secondary | ICD-10-CM | POA: Diagnosis not present

## 2022-04-07 DIAGNOSIS — G3184 Mild cognitive impairment, so stated: Secondary | ICD-10-CM | POA: Diagnosis not present

## 2022-04-07 DIAGNOSIS — I480 Paroxysmal atrial fibrillation: Secondary | ICD-10-CM | POA: Diagnosis not present

## 2022-04-07 DIAGNOSIS — Z8673 Personal history of transient ischemic attack (TIA), and cerebral infarction without residual deficits: Secondary | ICD-10-CM | POA: Diagnosis not present

## 2022-04-07 DIAGNOSIS — I714 Abdominal aortic aneurysm, without rupture, unspecified: Secondary | ICD-10-CM | POA: Diagnosis not present

## 2022-04-07 DIAGNOSIS — E785 Hyperlipidemia, unspecified: Secondary | ICD-10-CM | POA: Diagnosis not present

## 2022-04-07 DIAGNOSIS — Z0000 Encounter for general adult medical examination without abnormal findings: Secondary | ICD-10-CM | POA: Diagnosis not present

## 2022-04-07 DIAGNOSIS — I7121 Aneurysm of the ascending aorta, without rupture: Secondary | ICD-10-CM | POA: Diagnosis not present

## 2022-04-07 DIAGNOSIS — N189 Chronic kidney disease, unspecified: Secondary | ICD-10-CM | POA: Diagnosis not present

## 2022-04-23 DIAGNOSIS — Z20822 Contact with and (suspected) exposure to covid-19: Secondary | ICD-10-CM | POA: Diagnosis not present

## 2022-08-22 DIAGNOSIS — N189 Chronic kidney disease, unspecified: Secondary | ICD-10-CM | POA: Diagnosis not present

## 2022-08-22 DIAGNOSIS — I480 Paroxysmal atrial fibrillation: Secondary | ICD-10-CM | POA: Diagnosis not present

## 2022-08-22 DIAGNOSIS — R54 Age-related physical debility: Secondary | ICD-10-CM | POA: Diagnosis not present

## 2022-08-22 DIAGNOSIS — G3184 Mild cognitive impairment, so stated: Secondary | ICD-10-CM | POA: Diagnosis not present

## 2022-08-26 DIAGNOSIS — Z111 Encounter for screening for respiratory tuberculosis: Secondary | ICD-10-CM | POA: Diagnosis not present

## 2022-09-02 DIAGNOSIS — Z8679 Personal history of other diseases of the circulatory system: Secondary | ICD-10-CM | POA: Diagnosis not present

## 2022-09-02 DIAGNOSIS — I480 Paroxysmal atrial fibrillation: Secondary | ICD-10-CM | POA: Diagnosis not present

## 2022-09-02 DIAGNOSIS — E785 Hyperlipidemia, unspecified: Secondary | ICD-10-CM | POA: Diagnosis not present

## 2022-09-02 DIAGNOSIS — G3184 Mild cognitive impairment, so stated: Secondary | ICD-10-CM | POA: Diagnosis not present

## 2022-09-02 DIAGNOSIS — N189 Chronic kidney disease, unspecified: Secondary | ICD-10-CM | POA: Diagnosis not present

## 2022-09-02 DIAGNOSIS — I69311 Memory deficit following cerebral infarction: Secondary | ICD-10-CM | POA: Diagnosis not present

## 2022-09-04 DIAGNOSIS — M6281 Muscle weakness (generalized): Secondary | ICD-10-CM | POA: Diagnosis not present

## 2022-09-04 DIAGNOSIS — R2689 Other abnormalities of gait and mobility: Secondary | ICD-10-CM | POA: Diagnosis not present

## 2022-09-04 DIAGNOSIS — R279 Unspecified lack of coordination: Secondary | ICD-10-CM | POA: Diagnosis not present

## 2022-09-10 DIAGNOSIS — M6281 Muscle weakness (generalized): Secondary | ICD-10-CM | POA: Diagnosis not present

## 2022-09-10 DIAGNOSIS — R279 Unspecified lack of coordination: Secondary | ICD-10-CM | POA: Diagnosis not present

## 2022-09-10 DIAGNOSIS — R2689 Other abnormalities of gait and mobility: Secondary | ICD-10-CM | POA: Diagnosis not present

## 2022-09-12 DIAGNOSIS — R279 Unspecified lack of coordination: Secondary | ICD-10-CM | POA: Diagnosis not present

## 2022-09-12 DIAGNOSIS — M6281 Muscle weakness (generalized): Secondary | ICD-10-CM | POA: Diagnosis not present

## 2022-09-12 DIAGNOSIS — R2689 Other abnormalities of gait and mobility: Secondary | ICD-10-CM | POA: Diagnosis not present

## 2022-09-16 DIAGNOSIS — M6281 Muscle weakness (generalized): Secondary | ICD-10-CM | POA: Diagnosis not present

## 2022-09-16 DIAGNOSIS — R2689 Other abnormalities of gait and mobility: Secondary | ICD-10-CM | POA: Diagnosis not present

## 2022-09-16 DIAGNOSIS — R279 Unspecified lack of coordination: Secondary | ICD-10-CM | POA: Diagnosis not present

## 2022-09-18 DIAGNOSIS — R279 Unspecified lack of coordination: Secondary | ICD-10-CM | POA: Diagnosis not present

## 2022-09-18 DIAGNOSIS — M6281 Muscle weakness (generalized): Secondary | ICD-10-CM | POA: Diagnosis not present

## 2022-09-18 DIAGNOSIS — R2689 Other abnormalities of gait and mobility: Secondary | ICD-10-CM | POA: Diagnosis not present

## 2022-09-19 DIAGNOSIS — I719 Aortic aneurysm of unspecified site, without rupture: Secondary | ICD-10-CM | POA: Diagnosis not present

## 2022-09-19 DIAGNOSIS — E785 Hyperlipidemia, unspecified: Secondary | ICD-10-CM | POA: Diagnosis not present

## 2022-09-23 DIAGNOSIS — R279 Unspecified lack of coordination: Secondary | ICD-10-CM | POA: Diagnosis not present

## 2022-09-23 DIAGNOSIS — R2689 Other abnormalities of gait and mobility: Secondary | ICD-10-CM | POA: Diagnosis not present

## 2022-09-23 DIAGNOSIS — M6281 Muscle weakness (generalized): Secondary | ICD-10-CM | POA: Diagnosis not present

## 2022-09-24 DIAGNOSIS — R279 Unspecified lack of coordination: Secondary | ICD-10-CM | POA: Diagnosis not present

## 2022-09-24 DIAGNOSIS — R2689 Other abnormalities of gait and mobility: Secondary | ICD-10-CM | POA: Diagnosis not present

## 2022-09-24 DIAGNOSIS — M6281 Muscle weakness (generalized): Secondary | ICD-10-CM | POA: Diagnosis not present

## 2022-09-30 DIAGNOSIS — K5900 Constipation, unspecified: Secondary | ICD-10-CM | POA: Diagnosis not present

## 2022-09-30 DIAGNOSIS — W010XXA Fall on same level from slipping, tripping and stumbling without subsequent striking against object, initial encounter: Secondary | ICD-10-CM | POA: Diagnosis not present

## 2022-09-30 DIAGNOSIS — I480 Paroxysmal atrial fibrillation: Secondary | ICD-10-CM | POA: Diagnosis not present

## 2022-10-01 DIAGNOSIS — R279 Unspecified lack of coordination: Secondary | ICD-10-CM | POA: Diagnosis not present

## 2022-10-01 DIAGNOSIS — M6281 Muscle weakness (generalized): Secondary | ICD-10-CM | POA: Diagnosis not present

## 2022-10-01 DIAGNOSIS — R2689 Other abnormalities of gait and mobility: Secondary | ICD-10-CM | POA: Diagnosis not present

## 2022-10-02 DIAGNOSIS — M6281 Muscle weakness (generalized): Secondary | ICD-10-CM | POA: Diagnosis not present

## 2022-10-02 DIAGNOSIS — R2689 Other abnormalities of gait and mobility: Secondary | ICD-10-CM | POA: Diagnosis not present

## 2022-10-02 DIAGNOSIS — R279 Unspecified lack of coordination: Secondary | ICD-10-CM | POA: Diagnosis not present

## 2022-10-07 DIAGNOSIS — M6281 Muscle weakness (generalized): Secondary | ICD-10-CM | POA: Diagnosis not present

## 2022-10-07 DIAGNOSIS — R279 Unspecified lack of coordination: Secondary | ICD-10-CM | POA: Diagnosis not present

## 2022-10-07 DIAGNOSIS — R2689 Other abnormalities of gait and mobility: Secondary | ICD-10-CM | POA: Diagnosis not present

## 2022-10-09 DIAGNOSIS — R279 Unspecified lack of coordination: Secondary | ICD-10-CM | POA: Diagnosis not present

## 2022-10-09 DIAGNOSIS — R2689 Other abnormalities of gait and mobility: Secondary | ICD-10-CM | POA: Diagnosis not present

## 2022-10-09 DIAGNOSIS — M6281 Muscle weakness (generalized): Secondary | ICD-10-CM | POA: Diagnosis not present

## 2022-10-14 DIAGNOSIS — Z8679 Personal history of other diseases of the circulatory system: Secondary | ICD-10-CM | POA: Diagnosis not present

## 2022-10-14 DIAGNOSIS — W010XXA Fall on same level from slipping, tripping and stumbling without subsequent striking against object, initial encounter: Secondary | ICD-10-CM | POA: Diagnosis not present

## 2022-10-14 DIAGNOSIS — E785 Hyperlipidemia, unspecified: Secondary | ICD-10-CM | POA: Diagnosis not present

## 2022-10-14 DIAGNOSIS — I69311 Memory deficit following cerebral infarction: Secondary | ICD-10-CM | POA: Diagnosis not present

## 2022-10-14 DIAGNOSIS — G3184 Mild cognitive impairment, so stated: Secondary | ICD-10-CM | POA: Diagnosis not present

## 2022-10-14 DIAGNOSIS — I719 Aortic aneurysm of unspecified site, without rupture: Secondary | ICD-10-CM | POA: Diagnosis not present

## 2022-10-14 DIAGNOSIS — I480 Paroxysmal atrial fibrillation: Secondary | ICD-10-CM | POA: Diagnosis not present

## 2022-10-16 DIAGNOSIS — R2689 Other abnormalities of gait and mobility: Secondary | ICD-10-CM | POA: Diagnosis not present

## 2022-10-16 DIAGNOSIS — R279 Unspecified lack of coordination: Secondary | ICD-10-CM | POA: Diagnosis not present

## 2022-10-16 DIAGNOSIS — M6281 Muscle weakness (generalized): Secondary | ICD-10-CM | POA: Diagnosis not present

## 2022-10-17 DIAGNOSIS — R2689 Other abnormalities of gait and mobility: Secondary | ICD-10-CM | POA: Diagnosis not present

## 2022-10-17 DIAGNOSIS — R279 Unspecified lack of coordination: Secondary | ICD-10-CM | POA: Diagnosis not present

## 2022-10-17 DIAGNOSIS — M6281 Muscle weakness (generalized): Secondary | ICD-10-CM | POA: Diagnosis not present

## 2022-10-20 DIAGNOSIS — R279 Unspecified lack of coordination: Secondary | ICD-10-CM | POA: Diagnosis not present

## 2022-10-20 DIAGNOSIS — R2689 Other abnormalities of gait and mobility: Secondary | ICD-10-CM | POA: Diagnosis not present

## 2022-10-20 DIAGNOSIS — M6281 Muscle weakness (generalized): Secondary | ICD-10-CM | POA: Diagnosis not present

## 2022-10-22 DIAGNOSIS — R2689 Other abnormalities of gait and mobility: Secondary | ICD-10-CM | POA: Diagnosis not present

## 2022-10-22 DIAGNOSIS — R279 Unspecified lack of coordination: Secondary | ICD-10-CM | POA: Diagnosis not present

## 2022-10-22 DIAGNOSIS — M6281 Muscle weakness (generalized): Secondary | ICD-10-CM | POA: Diagnosis not present

## 2022-10-27 DIAGNOSIS — R279 Unspecified lack of coordination: Secondary | ICD-10-CM | POA: Diagnosis not present

## 2022-10-27 DIAGNOSIS — M6281 Muscle weakness (generalized): Secondary | ICD-10-CM | POA: Diagnosis not present

## 2022-10-27 DIAGNOSIS — R2689 Other abnormalities of gait and mobility: Secondary | ICD-10-CM | POA: Diagnosis not present

## 2022-10-28 DIAGNOSIS — K5900 Constipation, unspecified: Secondary | ICD-10-CM | POA: Diagnosis not present

## 2022-10-28 DIAGNOSIS — I480 Paroxysmal atrial fibrillation: Secondary | ICD-10-CM | POA: Diagnosis not present

## 2022-10-28 DIAGNOSIS — Z8679 Personal history of other diseases of the circulatory system: Secondary | ICD-10-CM | POA: Diagnosis not present

## 2022-10-28 DIAGNOSIS — G3184 Mild cognitive impairment, so stated: Secondary | ICD-10-CM | POA: Diagnosis not present

## 2022-10-29 DIAGNOSIS — R279 Unspecified lack of coordination: Secondary | ICD-10-CM | POA: Diagnosis not present

## 2022-10-29 DIAGNOSIS — M6281 Muscle weakness (generalized): Secondary | ICD-10-CM | POA: Diagnosis not present

## 2022-10-29 DIAGNOSIS — R2689 Other abnormalities of gait and mobility: Secondary | ICD-10-CM | POA: Diagnosis not present

## 2022-10-31 ENCOUNTER — Emergency Department (HOSPITAL_COMMUNITY): Payer: Medicare Other

## 2022-10-31 ENCOUNTER — Other Ambulatory Visit: Payer: Self-pay

## 2022-10-31 ENCOUNTER — Encounter (HOSPITAL_COMMUNITY): Payer: Self-pay

## 2022-10-31 ENCOUNTER — Emergency Department (HOSPITAL_COMMUNITY)
Admission: EM | Admit: 2022-10-31 | Discharge: 2022-10-31 | Disposition: A | Discharge status: Skilled Nursing Facility | Payer: Medicare Other | Attending: Emergency Medicine | Admitting: Emergency Medicine

## 2022-10-31 DIAGNOSIS — I714 Abdominal aortic aneurysm, without rupture, unspecified: Secondary | ICD-10-CM | POA: Diagnosis not present

## 2022-10-31 DIAGNOSIS — Z853 Personal history of malignant neoplasm of breast: Secondary | ICD-10-CM | POA: Diagnosis not present

## 2022-10-31 DIAGNOSIS — M25519 Pain in unspecified shoulder: Secondary | ICD-10-CM | POA: Insufficient documentation

## 2022-10-31 DIAGNOSIS — R079 Chest pain, unspecified: Secondary | ICD-10-CM | POA: Diagnosis not present

## 2022-10-31 DIAGNOSIS — F0390 Unspecified dementia without behavioral disturbance: Secondary | ICD-10-CM | POA: Insufficient documentation

## 2022-10-31 DIAGNOSIS — R4182 Altered mental status, unspecified: Secondary | ICD-10-CM | POA: Diagnosis not present

## 2022-10-31 DIAGNOSIS — T68XXXA Hypothermia, initial encounter: Secondary | ICD-10-CM | POA: Diagnosis not present

## 2022-10-31 DIAGNOSIS — I5032 Chronic diastolic (congestive) heart failure: Secondary | ICD-10-CM | POA: Diagnosis not present

## 2022-10-31 DIAGNOSIS — Z79899 Other long term (current) drug therapy: Secondary | ICD-10-CM | POA: Diagnosis not present

## 2022-10-31 DIAGNOSIS — I728 Aneurysm of other specified arteries: Secondary | ICD-10-CM | POA: Diagnosis not present

## 2022-10-31 DIAGNOSIS — M810 Age-related osteoporosis without current pathological fracture: Secondary | ICD-10-CM | POA: Diagnosis not present

## 2022-10-31 DIAGNOSIS — J90 Pleural effusion, not elsewhere classified: Secondary | ICD-10-CM | POA: Diagnosis not present

## 2022-10-31 DIAGNOSIS — Z7982 Long term (current) use of aspirin: Secondary | ICD-10-CM | POA: Insufficient documentation

## 2022-10-31 DIAGNOSIS — K5730 Diverticulosis of large intestine without perforation or abscess without bleeding: Secondary | ICD-10-CM | POA: Diagnosis not present

## 2022-10-31 DIAGNOSIS — I2510 Atherosclerotic heart disease of native coronary artery without angina pectoris: Secondary | ICD-10-CM | POA: Diagnosis not present

## 2022-10-31 DIAGNOSIS — R0689 Other abnormalities of breathing: Secondary | ICD-10-CM | POA: Diagnosis not present

## 2022-10-31 DIAGNOSIS — I701 Atherosclerosis of renal artery: Secondary | ICD-10-CM | POA: Diagnosis not present

## 2022-10-31 DIAGNOSIS — I774 Celiac artery compression syndrome: Secondary | ICD-10-CM | POA: Diagnosis not present

## 2022-10-31 DIAGNOSIS — R109 Unspecified abdominal pain: Secondary | ICD-10-CM | POA: Diagnosis not present

## 2022-10-31 DIAGNOSIS — R1084 Generalized abdominal pain: Secondary | ICD-10-CM | POA: Insufficient documentation

## 2022-10-31 DIAGNOSIS — Z7401 Bed confinement status: Secondary | ICD-10-CM | POA: Diagnosis not present

## 2022-10-31 DIAGNOSIS — R918 Other nonspecific abnormal finding of lung field: Secondary | ICD-10-CM | POA: Diagnosis not present

## 2022-10-31 DIAGNOSIS — R457 State of emotional shock and stress, unspecified: Secondary | ICD-10-CM | POA: Diagnosis not present

## 2022-10-31 LAB — CBC WITH DIFFERENTIAL/PLATELET
Abs Immature Granulocytes: 0.07 10*3/uL (ref 0.00–0.07)
Basophils Absolute: 0.1 10*3/uL (ref 0.0–0.1)
Basophils Relative: 0 %
Eosinophils Absolute: 0.1 10*3/uL (ref 0.0–0.5)
Eosinophils Relative: 1 %
HCT: 39.4 % (ref 36.0–46.0)
Hemoglobin: 12.4 g/dL (ref 12.0–15.0)
Immature Granulocytes: 1 %
Lymphocytes Relative: 4 %
Lymphs Abs: 0.6 10*3/uL — ABNORMAL LOW (ref 0.7–4.0)
MCH: 29.2 pg (ref 26.0–34.0)
MCHC: 31.5 g/dL (ref 30.0–36.0)
MCV: 92.9 fL (ref 80.0–100.0)
Monocytes Absolute: 0.3 10*3/uL (ref 0.1–1.0)
Monocytes Relative: 2 %
Neutro Abs: 12.7 10*3/uL — ABNORMAL HIGH (ref 1.7–7.7)
Neutrophils Relative %: 92 %
Platelets: 319 10*3/uL (ref 150–400)
RBC: 4.24 MIL/uL (ref 3.87–5.11)
RDW: 13.2 % (ref 11.5–15.5)
WBC: 13.8 10*3/uL — ABNORMAL HIGH (ref 4.0–10.5)
nRBC: 0 % (ref 0.0–0.2)

## 2022-10-31 LAB — COMPREHENSIVE METABOLIC PANEL
ALT: 9 U/L (ref 0–44)
AST: 20 U/L (ref 15–41)
Albumin: 3.8 g/dL (ref 3.5–5.0)
Alkaline Phosphatase: 58 U/L (ref 38–126)
Anion gap: 11 (ref 5–15)
BUN: 22 mg/dL (ref 8–23)
CO2: 23 mmol/L (ref 22–32)
Calcium: 8.9 mg/dL (ref 8.9–10.3)
Chloride: 107 mmol/L (ref 98–111)
Creatinine, Ser: 1.59 mg/dL — ABNORMAL HIGH (ref 0.44–1.00)
GFR, Estimated: 31 mL/min — ABNORMAL LOW (ref >60)
Glucose, Bld: 150 mg/dL — ABNORMAL HIGH (ref 70–99)
Potassium: 3.8 mmol/L (ref 3.5–5.1)
Sodium: 141 mmol/L (ref 135–145)
Total Bilirubin: 1 mg/dL (ref 0.3–1.2)
Total Protein: 7.5 g/dL (ref 6.5–8.1)

## 2022-10-31 LAB — LIPASE, BLOOD: Lipase: 45 U/L (ref 11–51)

## 2022-10-31 LAB — TROPONIN I (HIGH SENSITIVITY): Troponin I (High Sensitivity): 9 ng/L (ref <18)

## 2022-10-31 MED ORDER — FENTANYL CITRATE PF 50 MCG/ML IJ SOSY
50.0000 ug | PREFILLED_SYRINGE | Freq: Once | INTRAMUSCULAR | Status: AC
  Administered 2022-10-31: 50 ug via INTRAVENOUS
  Filled 2022-10-31: qty 1

## 2022-10-31 MED ORDER — IOHEXOL 350 MG/ML SOLN
80.0000 mL | Freq: Once | INTRAVENOUS | Status: AC | PRN
  Administered 2022-10-31: 80 mL via INTRAVENOUS

## 2022-10-31 MED ORDER — ONDANSETRON HCL 4 MG/2ML IJ SOLN
4.0000 mg | Freq: Once | INTRAMUSCULAR | Status: AC
  Administered 2022-10-31: 4 mg via INTRAVENOUS
  Filled 2022-10-31: qty 2

## 2022-10-31 MED ORDER — SODIUM CHLORIDE (PF) 0.9 % IJ SOLN
INTRAMUSCULAR | Status: AC
  Filled 2022-10-31: qty 50

## 2022-10-31 NOTE — ED Notes (Signed)
Nurse called [facility] of [facility] twice to give report to nurse and was unable to reach her. Per receptionist Aniya nurse will maybe call back if VM is left nurse left VM.

## 2022-10-31 NOTE — ED Triage Notes (Signed)
Pt bib ems from [facility] c/o left shoulder pain. Facility denies falls or injury. Hx dementia

## 2022-10-31 NOTE — ED Provider Notes (Addendum)
[facility] DEPT Provider Note   CSN: [id number] Arrival date & time: 10/31/22  0907     History  Chief Complaint  Patient presents with   Shoulder Pain    Leslie Carr is a 88 y.o. female.   Shoulder Pain Patient reportedly sent in from nursing home with shoulder pain.  History of dementia.  No reported injury.  Patient had just vomited when I evaluated her.  States she feels bad and asked for help.  Tenderness to abdomen.  Reviewing notes it appears has known thoracic and abdominal aneurysms they are not being followed because she would not want surgery.  Is on Eliquis    Past Medical History:  Diagnosis Date   Acute metabolic encephalopathy 1/95/0932   Breast cancer (HCC)    CHF (congestive heart failure) (HCC)    Coronary artery disease due to calcified coronary lesion 09/2018   ACS PCI of OM2 on 09/21/2018 followed by staged orbital atherectomy based PCI of ostial to distal RCA.  (Details in Surgical History)   Diastolic dysfunction with chronic heart failure (HCC)    History of bronchitis    Hypercholesteremia    Mass of middle lobe of right lung    Mild aortic stenosis by prior echocardiogram 09/2018   Mean gradient 14 million mercury, peak 24 mmHg   Numbness and tingling in left hand    Pleural effusion on left    Shortness of breath dyspnea    "only going up a flight of stairs"   Stroke [facility]) 2014   left sided weakness - mainly left hand   TIA (transient ischemic attack) 01/2020    Home Medications Prior to Admission medications   Medication Sig Start Date End Date Taking? Authorizing Provider  acetaminophen (TYLENOL) 500 MG tablet Take 500 mg by mouth as needed. For pain when needed.    [provider]  apixaban (ELIQUIS) 5 MG TABS tablet Take 1 tablet (5 mg total) by mouth 2 (two) times daily. 02/06/20   Geradine Girt, DO  aspirin EC 81 MG EC tablet Take 1 tablet (81 mg total) by mouth daily. 02/06/20   Geradine Girt, DO  docusate sodium (COLACE) 100 MG capsule Take 100 mg by mouth every evening.    [provider]  pravastatin (PRAVACHOL) 40 MG tablet Take 40 mg by mouth daily. 01/02/20   [provider]      Allergies    Lipitor [atorvastatin]    Review of Systems   Review of Systems  Physical Exam Updated Vital Signs BP 136/74 (BP Location: Right Arm)   Pulse (!) 103   Temp 98.2 F (36.8 C) (Oral)   Resp (!) 22   Ht '5\' 2"'$  (1.575 m)   Wt 60.8 kg   SpO2 93%   BMI 24.52 kg/m  Physical Exam Vitals reviewed.  HENT:     Head: Atraumatic.  Cardiovascular:     Rate and Rhythm: Regular rhythm. Tachycardia present.  Abdominal:     Tenderness: There is abdominal tenderness.     Comments: Moderate diffuse tenderness with some guarding.  Musculoskeletal:     Comments: Difficulty with examination.  Really not following many commands.  Shoulder does not appear deformed on left  Skin:    Capillary Refill: Capillary refill takes less than 2 seconds.  Neurological:     Mental Status: She is alert. Mental status is at baseline.     ED Results / Procedures / Treatments  Labs (all labs ordered are listed, but only abnormal results are displayed) Labs Reviewed  COMPREHENSIVE METABOLIC PANEL - Abnormal; Notable for the following components:      Result Value   Glucose, Bld 150 (*)    Creatinine, Ser 1.59 (*)    GFR, Estimated 31 (*)    All other components within normal limits  CBC WITH DIFFERENTIAL/PLATELET - Abnormal; Notable for the following components:   WBC 13.8 (*)    Neutro Abs 12.7 (*)    Lymphs Abs 0.6 (*)    All other components within normal limits  LIPASE, BLOOD  TROPONIN I (HIGH SENSITIVITY)    EKG None  Radiology CT Angio Chest/Abd/Pel for Dissection W and/or Wo Contrast  Result Date: 10/31/2022 CLINICAL DATA:  Patient with known aneurysm presents with shoulder pain. EXAM: CT ANGIOGRAPHY CHEST, ABDOMEN AND PELVIS TECHNIQUE: Non-contrast CT  of the chest was initially obtained. Multidetector CT imaging through the chest, abdomen and pelvis was performed using the standard protocol during bolus administration of intravenous contrast. Multiplanar reconstructed images and MIPs were obtained and reviewed to evaluate the vascular anatomy. RADIATION DOSE REDUCTION: This exam was performed according to the departmental dose-optimization program which includes automated exposure control, adjustment of the mA and/or kV according to patient size and/or use of iterative reconstruction technique. CONTRAST:  74m OMNIPAQUE IOHEXOL 350 MG/ML SOLN COMPARISON:  08/12/2021. FINDINGS: CTA CHEST FINDINGS Cardiovascular: Preferential opacification of the thoracic aorta. No evidence of thoracic dissection. Aneurysmal dilatation of the ascending thoracic aorta is again noted. The ascending thoracic aorta measures 4.5 cm, image 43/7. This is compared with 4.7 cm on 08/12/2021. 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 There is extensive soft and calcified plaque with marked narrowing of the proximal subclavian artery with at least 90% stenosis. Distal to this area there are multiple areas of high-grade stenosis involving the mid and distal left subclavian artery. Contrast opacification within the left axillary artery which appears diminutive in caliber is noted, image 51/6. The heart size appears normal. No pericardial effusion. Coronary artery calcifications. Mediastinum/Nodes: Thyroid gland, trachea and esophagus are unremarkable. No enlarged axillary, mediastinal, or hilar lymph nodes. Lungs/Pleura: Trace pleural fluid identified bilaterally. Dependent changes are noted within the posterior lung bases with increased ground-glass attenuation and interstitial thickening. No airspace consolidation. Right upper lobe nodule measures 5 mm, image 24/5.  Previously 4 mm. Posterior right lower lobe lung nodule is  partially obscured by motion artifact measuring 7 mm, image 90/5. Unchanged from previous exam. Unchanged appearance of partially calcified perihilar nodule in the central left lower lobe measuring 9 mm, image 63/5. Posterior right middle lobe partially calcified mass abutting major and minor fissures measuring 3.5 x 2.2 cm, image 69/5, image 69/5. Unchanged from previous exam. Musculoskeletal: Severe left glenohumeral joint osteoarthritis. No acute or suspicious osseous findings. Status post left mastectomy. Remote healed left lateral rib fracture identified. Review of the MIP images confirms the above findings. CTA ABDOMEN AND PELVIS FINDINGS VASCULAR Aorta: The infrarenal abdominal aorta is stable measuring 4.2 cm, image 106/6. Extensive aortic atherosclerotic calcifications noted. No evidence for dissection. No signs of significant stenosis. Celiac: Approximately 50% stenosis is identified at the origin of the celiac artery secondary to calcified plaque. SMA: Calcified plaque at the proximal superior mesenteric artery with less than 50% stenosis Renals: There is high-grade stenosis at the origin of bilateral renal arteries. No evidence for aneurysm, dissection, vasculitis or fibromuscular dysplasia. IMA: Not visualized  and likely chronically occluded. Inflow: Patent without evidence of aneurysm, dissection, vasculitis or significant stenosis. Veins: No obvious venous abnormality within the limitations of this arterial phase study. Review of the MIP images confirms the above findings. NON-VASCULAR Hepatobiliary: No focal liver abnormality is seen. No gallstones, gallbladder wall thickening, or biliary dilatation. Pancreas: Unremarkable. No pancreatic ductal dilatation or surrounding inflammatory changes. Spleen: Normal in size without focal abnormality. Adrenals/Urinary Tract: Normal adrenal glands. No nephrolithiasis, hydronephrosis, or mass. Urinary bladder is unremarkable. Stomach/Bowel: Stomach appears  normal. No bowel wall thickening, inflammation or distension. Sigmoid diverticulosis without acute diverticulitis. Moderate desiccated stool noted within the rectum. Lymphatic: No signs of abdominopelvic adenopathy. Reproductive: Status post hysterectomy. No adnexal masses. Other: No free fluid or fluid collections. No signs of pneumoperitoneum. Musculoskeletal: No acute or significant osseous findings. Degenerative disc disease identified within the lumbar spine. Review of the MIP images confirms the above findings. IMPRESSION: 1. No evidence for aortic dissection. 2. Trace bilateral pleural effusions with increase ground-glass attenuation and interstitial thickening within the lung bases which may reflect mild CHF. 3. Stable appearance of ascending thoracic aortic aneurysm measuring 4.5 cm. Ascending thoracic aortic aneurysm. Recommend semi-annual imaging followup by CTA or MRA and referral to cardiothoracic surgery if not already obtained. This recommendation follows 2010 ACCF/AHA/AATS/ACR/ASA/SCA/SCAI/SIR/STS/SVM Guidelines for the Diagnosis and Management of Patients With Thoracic Aortic Disease. Circulation. 2010; 121: P710-G269. Aortic aneurysm NOS (ICD10-I71.9) 4. 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 Distal to this area there are multiple areas of high-grade stenosis involving the mid and distal left subclavian artery. 5. Extensive abdominal aortic atherosclerotic disease with branch vessel involvement. High-grade stenoses identified at the celiac artery and bilateral renal arteries. The inferior mesenteric artery is not visualized and may be chronically occluded. 6. Stable infrarenal abdominal aortic aneurysm measuring 4.2 cm. Recommend follow-up every 12 months and vascular consultation. This recommendation follows ACR consensus guidelines: White Paper of the ACR Incidental Findings Committee II on Vascular Findings. J Am Coll Radiol  2013; 10:789-794. 7. There are several small lung nodules identified bilaterally. As described on CT from 08/12/2021 there is been demonstrated slight interval growth of the 2 nodules in the right lung. Continued CT chest surveillance in 6 months is suggested. 8. Unchanged appearance of partially calcified posterior right middle lobe mass, previously biopsy proven amyloidosis. 9.  Aortic Atherosclerosis (ICD10-I70.0). Electronically Signed   By: Kerby Moors M.D.   On: 10/31/2022 12:09   DG Abdomen Acute W/Chest  Result Date: 10/31/2022 CLINICAL DATA:  Abdominal and chest pain. EXAM: DG ABDOMEN ACUTE WITH 1 VIEW CHEST COMPARISON:  Chest x-ray 05/27/2021. Chest abdomen and pelvis CT 08/12/2021. FINDINGS: Focal opacity in the right lung base again noted compatible with the mass lesion seen on recent CT scan. Cardiopericardial silhouette is at upper limits of normal for size. Bones are diffusely demineralized. Upright film shows no evidence for intraperitoneal free air. There is no evidence for gaseous bowel dilation to suggest obstruction. Prominent rectal stool volume evident. Bones are diffusely demineralized. IMPRESSION: 1. No evidence for bowel obstruction or intraperitoneal free air. 2. Prominent rectal stool volume. 3. Similar appearance of the known posterior right middle lobe pulmonary mass. Electronically Signed   By: Misty Stanley M.D.   On: 10/31/2022 10:23    Procedures Procedures    Medications Ordered in ED Medications  ondansetron (ZOFRAN) injection 4 mg (4 mg Intravenous Given 10/31/22 0959)  fentaNYL (SUBLIMAZE) injection 50 mcg (50 mcg Intravenous  Given 10/31/22 0958)  fentaNYL (SUBLIMAZE) injection 50 mcg (50 mcg Intravenous Given 10/31/22 1105)  iohexol (OMNIPAQUE) 350 MG/ML injection 80 mL (80 mLs Intravenous Contrast Given 10/31/22 1118)  sodium chloride (PF) 0.9 % injection (  Given by Other 10/31/22 1127)    ED Course/ Medical Decision Making/ A&P                            Medical Decision Making Amount and/or Complexity of Data Reviewed Labs: ordered. Radiology: ordered.  Risk Prescription drug management.   Patient came in with reported left chest pain.  Later complaining of abdominal pain.  Does have some dementia.  Discussed with patient's daughter over the phone.  Patient's daughter states that the patient will sometimes complain of pain so she can come to the ER for socialization.  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  Will often complain of belly pain or chest pain.  Patient's daughter states that she had to tell the patient she would only take her to the ER if she was having a stroke because she complained of chest pain sometimes.  CT scan done initially after blood work that showed a leukocytosis.  Did have diffuse tenderness.  CT scan was overall reassuring but does show some chronic disease.  Doubt acute findings.  Will attempt to feed patient.  Patient tolerated orals.  No more pain.  Will discharge back to nursing home.        Final Clinical Impression(s) / ED Diagnoses Final diagnoses:  Generalized abdominal pain    Rx / DC Orders ED Discharge Orders     None         Davonna Belling, MD 10/31/22 Ages, Alva Broxson, MD 10/31/22 1624

## 2022-10-31 NOTE — ED Notes (Signed)
Patient transported to CT 

## 2022-10-31 NOTE — ED Notes (Signed)
[facility] called  

## 2022-11-03 DIAGNOSIS — M6281 Muscle weakness (generalized): Secondary | ICD-10-CM | POA: Diagnosis not present

## 2022-11-03 DIAGNOSIS — R2689 Other abnormalities of gait and mobility: Secondary | ICD-10-CM | POA: Diagnosis not present

## 2022-11-03 DIAGNOSIS — R279 Unspecified lack of coordination: Secondary | ICD-10-CM | POA: Diagnosis not present

## 2022-11-04 DIAGNOSIS — W010XXA Fall on same level from slipping, tripping and stumbling without subsequent striking against object, initial encounter: Secondary | ICD-10-CM | POA: Diagnosis not present

## 2022-11-04 DIAGNOSIS — R410 Disorientation, unspecified: Secondary | ICD-10-CM | POA: Diagnosis not present

## 2022-11-04 DIAGNOSIS — I480 Paroxysmal atrial fibrillation: Secondary | ICD-10-CM | POA: Diagnosis not present

## 2022-11-04 DIAGNOSIS — N1832 Chronic kidney disease, stage 3b: Secondary | ICD-10-CM | POA: Diagnosis not present

## 2022-11-04 DIAGNOSIS — R300 Dysuria: Secondary | ICD-10-CM | POA: Diagnosis not present

## 2022-11-06 DIAGNOSIS — N189 Chronic kidney disease, unspecified: Secondary | ICD-10-CM | POA: Diagnosis not present

## 2022-11-06 DIAGNOSIS — K5904 Chronic idiopathic constipation: Secondary | ICD-10-CM | POA: Diagnosis not present

## 2022-11-06 DIAGNOSIS — M6281 Muscle weakness (generalized): Secondary | ICD-10-CM | POA: Diagnosis not present

## 2022-11-06 DIAGNOSIS — G301 Alzheimer's disease with late onset: Secondary | ICD-10-CM | POA: Diagnosis not present

## 2022-11-06 DIAGNOSIS — I480 Paroxysmal atrial fibrillation: Secondary | ICD-10-CM | POA: Diagnosis not present

## 2022-11-06 DIAGNOSIS — R2689 Other abnormalities of gait and mobility: Secondary | ICD-10-CM | POA: Diagnosis not present

## 2022-11-06 DIAGNOSIS — R279 Unspecified lack of coordination: Secondary | ICD-10-CM | POA: Diagnosis not present

## 2022-11-06 DIAGNOSIS — F0280 Dementia in other diseases classified elsewhere without behavioral disturbance: Secondary | ICD-10-CM | POA: Diagnosis not present

## 2022-11-06 DIAGNOSIS — Z7901 Long term (current) use of anticoagulants: Secondary | ICD-10-CM | POA: Diagnosis not present

## 2022-11-06 DIAGNOSIS — E782 Mixed hyperlipidemia: Secondary | ICD-10-CM | POA: Diagnosis not present

## 2022-11-10 DIAGNOSIS — E782 Mixed hyperlipidemia: Secondary | ICD-10-CM | POA: Diagnosis not present

## 2022-11-10 DIAGNOSIS — G301 Alzheimer's disease with late onset: Secondary | ICD-10-CM | POA: Diagnosis not present

## 2022-11-11 DIAGNOSIS — R2689 Other abnormalities of gait and mobility: Secondary | ICD-10-CM | POA: Diagnosis not present

## 2022-11-11 DIAGNOSIS — R279 Unspecified lack of coordination: Secondary | ICD-10-CM | POA: Diagnosis not present

## 2022-11-11 DIAGNOSIS — M6281 Muscle weakness (generalized): Secondary | ICD-10-CM | POA: Diagnosis not present

## 2022-11-14 DIAGNOSIS — R2689 Other abnormalities of gait and mobility: Secondary | ICD-10-CM | POA: Diagnosis not present

## 2022-11-14 DIAGNOSIS — M6281 Muscle weakness (generalized): Secondary | ICD-10-CM | POA: Diagnosis not present

## 2022-11-14 DIAGNOSIS — R279 Unspecified lack of coordination: Secondary | ICD-10-CM | POA: Diagnosis not present

## 2022-11-17 DIAGNOSIS — M6281 Muscle weakness (generalized): Secondary | ICD-10-CM | POA: Diagnosis not present

## 2022-11-17 DIAGNOSIS — R279 Unspecified lack of coordination: Secondary | ICD-10-CM | POA: Diagnosis not present

## 2022-11-17 DIAGNOSIS — R2689 Other abnormalities of gait and mobility: Secondary | ICD-10-CM | POA: Diagnosis not present

## 2022-11-19 DIAGNOSIS — M6281 Muscle weakness (generalized): Secondary | ICD-10-CM | POA: Diagnosis not present

## 2022-11-19 DIAGNOSIS — R279 Unspecified lack of coordination: Secondary | ICD-10-CM | POA: Diagnosis not present

## 2022-11-19 DIAGNOSIS — R2689 Other abnormalities of gait and mobility: Secondary | ICD-10-CM | POA: Diagnosis not present

## 2022-11-24 DIAGNOSIS — M6281 Muscle weakness (generalized): Secondary | ICD-10-CM | POA: Diagnosis not present

## 2022-11-24 DIAGNOSIS — R279 Unspecified lack of coordination: Secondary | ICD-10-CM | POA: Diagnosis not present

## 2022-11-24 DIAGNOSIS — R2689 Other abnormalities of gait and mobility: Secondary | ICD-10-CM | POA: Diagnosis not present

## 2022-11-25 DIAGNOSIS — K5900 Constipation, unspecified: Secondary | ICD-10-CM | POA: Diagnosis not present

## 2022-11-25 DIAGNOSIS — I480 Paroxysmal atrial fibrillation: Secondary | ICD-10-CM | POA: Diagnosis not present

## 2022-11-25 DIAGNOSIS — Z8673 Personal history of transient ischemic attack (TIA), and cerebral infarction without residual deficits: Secondary | ICD-10-CM | POA: Diagnosis not present

## 2022-11-25 DIAGNOSIS — E785 Hyperlipidemia, unspecified: Secondary | ICD-10-CM | POA: Diagnosis not present

## 2022-11-25 DIAGNOSIS — Z7901 Long term (current) use of anticoagulants: Secondary | ICD-10-CM | POA: Diagnosis not present

## 2022-11-25 DIAGNOSIS — Z7902 Long term (current) use of antithrombotics/antiplatelets: Secondary | ICD-10-CM | POA: Diagnosis not present

## 2022-11-26 DIAGNOSIS — M6281 Muscle weakness (generalized): Secondary | ICD-10-CM | POA: Diagnosis not present

## 2022-11-26 DIAGNOSIS — R279 Unspecified lack of coordination: Secondary | ICD-10-CM | POA: Diagnosis not present

## 2022-11-26 DIAGNOSIS — R2689 Other abnormalities of gait and mobility: Secondary | ICD-10-CM | POA: Diagnosis not present

## 2022-12-02 DIAGNOSIS — R279 Unspecified lack of coordination: Secondary | ICD-10-CM | POA: Diagnosis not present

## 2022-12-02 DIAGNOSIS — R2689 Other abnormalities of gait and mobility: Secondary | ICD-10-CM | POA: Diagnosis not present

## 2022-12-02 DIAGNOSIS — M6281 Muscle weakness (generalized): Secondary | ICD-10-CM | POA: Diagnosis not present

## 2022-12-04 DIAGNOSIS — M6281 Muscle weakness (generalized): Secondary | ICD-10-CM | POA: Diagnosis not present

## 2022-12-04 DIAGNOSIS — R279 Unspecified lack of coordination: Secondary | ICD-10-CM | POA: Diagnosis not present

## 2022-12-04 DIAGNOSIS — R2689 Other abnormalities of gait and mobility: Secondary | ICD-10-CM | POA: Diagnosis not present

## 2022-12-08 ENCOUNTER — Other Ambulatory Visit: Payer: Self-pay

## 2022-12-08 ENCOUNTER — Emergency Department (HOSPITAL_COMMUNITY): Payer: Medicare Other

## 2022-12-08 ENCOUNTER — Encounter (HOSPITAL_COMMUNITY): Payer: Self-pay | Admitting: Emergency Medicine

## 2022-12-08 ENCOUNTER — Inpatient Hospital Stay (HOSPITAL_COMMUNITY)
Admission: EM | Admit: 2022-12-08 | Discharge: 2022-12-11 | DRG: 177 | Disposition: A | Discharge status: Home Health | Payer: Medicare Other | Source: Skilled Nursing Facility | Attending: Internal Medicine | Admitting: Internal Medicine

## 2022-12-08 DIAGNOSIS — R531 Weakness: Secondary | ICD-10-CM | POA: Diagnosis not present

## 2022-12-08 DIAGNOSIS — R000 Tachycardia, unspecified: Secondary | ICD-10-CM | POA: Diagnosis not present

## 2022-12-08 DIAGNOSIS — Z8249 Family history of ischemic heart disease and other diseases of the circulatory system: Secondary | ICD-10-CM | POA: Diagnosis not present

## 2022-12-08 DIAGNOSIS — S199XXA Unspecified injury of neck, initial encounter: Secondary | ICD-10-CM | POA: Diagnosis not present

## 2022-12-08 DIAGNOSIS — Z7982 Long term (current) use of aspirin: Secondary | ICD-10-CM | POA: Diagnosis not present

## 2022-12-08 DIAGNOSIS — U071 COVID-19: Principal | ICD-10-CM | POA: Diagnosis present

## 2022-12-08 DIAGNOSIS — I69354 Hemiplegia and hemiparesis following cerebral infarction affecting left non-dominant side: Secondary | ICD-10-CM | POA: Diagnosis not present

## 2022-12-08 DIAGNOSIS — R410 Disorientation, unspecified: Secondary | ICD-10-CM | POA: Diagnosis not present

## 2022-12-08 DIAGNOSIS — Z7901 Long term (current) use of anticoagulants: Secondary | ICD-10-CM | POA: Diagnosis not present

## 2022-12-08 DIAGNOSIS — W19XXXA Unspecified fall, initial encounter: Secondary | ICD-10-CM | POA: Diagnosis not present

## 2022-12-08 DIAGNOSIS — G9340 Encephalopathy, unspecified: Secondary | ICD-10-CM

## 2022-12-08 DIAGNOSIS — Z90710 Acquired absence of both cervix and uterus: Secondary | ICD-10-CM | POA: Diagnosis not present

## 2022-12-08 DIAGNOSIS — I509 Heart failure, unspecified: Secondary | ICD-10-CM | POA: Diagnosis present

## 2022-12-08 DIAGNOSIS — T1490XA Injury, unspecified, initial encounter: Secondary | ICD-10-CM | POA: Diagnosis not present

## 2022-12-08 DIAGNOSIS — I7143 Infrarenal abdominal aortic aneurysm, without rupture: Secondary | ICD-10-CM | POA: Diagnosis present

## 2022-12-08 DIAGNOSIS — I700 Atherosclerosis of aorta: Secondary | ICD-10-CM | POA: Diagnosis present

## 2022-12-08 DIAGNOSIS — Z87891 Personal history of nicotine dependence: Secondary | ICD-10-CM | POA: Diagnosis not present

## 2022-12-08 DIAGNOSIS — Z955 Presence of coronary angioplasty implant and graft: Secondary | ICD-10-CM | POA: Diagnosis not present

## 2022-12-08 DIAGNOSIS — N1832 Chronic kidney disease, stage 3b: Secondary | ICD-10-CM | POA: Diagnosis present

## 2022-12-08 DIAGNOSIS — I714 Abdominal aortic aneurysm, without rupture, unspecified: Secondary | ICD-10-CM | POA: Diagnosis not present

## 2022-12-08 DIAGNOSIS — E860 Dehydration: Secondary | ICD-10-CM | POA: Diagnosis present

## 2022-12-08 DIAGNOSIS — I7121 Aneurysm of the ascending aorta, without rupture: Secondary | ICD-10-CM | POA: Diagnosis present

## 2022-12-08 DIAGNOSIS — R112 Nausea with vomiting, unspecified: Secondary | ICD-10-CM | POA: Diagnosis not present

## 2022-12-08 DIAGNOSIS — N179 Acute kidney failure, unspecified: Secondary | ICD-10-CM | POA: Diagnosis present

## 2022-12-08 DIAGNOSIS — E7800 Pure hypercholesterolemia, unspecified: Secondary | ICD-10-CM | POA: Diagnosis present

## 2022-12-08 DIAGNOSIS — M4319 Spondylolisthesis, multiple sites in spine: Secondary | ICD-10-CM | POA: Diagnosis not present

## 2022-12-08 DIAGNOSIS — Z7401 Bed confinement status: Secondary | ICD-10-CM | POA: Diagnosis not present

## 2022-12-08 DIAGNOSIS — I2510 Atherosclerotic heart disease of native coronary artery without angina pectoris: Secondary | ICD-10-CM | POA: Diagnosis present

## 2022-12-08 DIAGNOSIS — I480 Paroxysmal atrial fibrillation: Secondary | ICD-10-CM | POA: Diagnosis present

## 2022-12-08 DIAGNOSIS — Z853 Personal history of malignant neoplasm of breast: Secondary | ICD-10-CM | POA: Diagnosis not present

## 2022-12-08 DIAGNOSIS — G9341 Metabolic encephalopathy: Secondary | ICD-10-CM | POA: Diagnosis present

## 2022-12-08 DIAGNOSIS — E041 Nontoxic single thyroid nodule: Secondary | ICD-10-CM | POA: Diagnosis not present

## 2022-12-08 DIAGNOSIS — K449 Diaphragmatic hernia without obstruction or gangrene: Secondary | ICD-10-CM | POA: Diagnosis not present

## 2022-12-08 DIAGNOSIS — Z8673 Personal history of transient ischemic attack (TIA), and cerebral infarction without residual deficits: Secondary | ICD-10-CM

## 2022-12-08 DIAGNOSIS — F0390 Unspecified dementia without behavioral disturbance: Secondary | ICD-10-CM | POA: Diagnosis present

## 2022-12-08 DIAGNOSIS — A419 Sepsis, unspecified organism: Secondary | ICD-10-CM | POA: Diagnosis not present

## 2022-12-08 DIAGNOSIS — R1111 Vomiting without nausea: Secondary | ICD-10-CM | POA: Diagnosis not present

## 2022-12-08 DIAGNOSIS — R110 Nausea: Secondary | ICD-10-CM | POA: Diagnosis not present

## 2022-12-08 DIAGNOSIS — I6523 Occlusion and stenosis of bilateral carotid arteries: Secondary | ICD-10-CM | POA: Diagnosis not present

## 2022-12-08 DIAGNOSIS — S3993XA Unspecified injury of pelvis, initial encounter: Secondary | ICD-10-CM | POA: Diagnosis not present

## 2022-12-08 DIAGNOSIS — J9811 Atelectasis: Secondary | ICD-10-CM | POA: Diagnosis not present

## 2022-12-08 DIAGNOSIS — S0990XA Unspecified injury of head, initial encounter: Secondary | ICD-10-CM | POA: Diagnosis not present

## 2022-12-08 DIAGNOSIS — J90 Pleural effusion, not elsewhere classified: Secondary | ICD-10-CM | POA: Diagnosis not present

## 2022-12-08 DIAGNOSIS — I728 Aneurysm of other specified arteries: Secondary | ICD-10-CM | POA: Diagnosis not present

## 2022-12-08 LAB — CBC WITH DIFFERENTIAL/PLATELET
Abs Immature Granulocytes: 0.04 10*3/uL (ref 0.00–0.07)
Basophils Absolute: 0.1 10*3/uL (ref 0.0–0.1)
Basophils Relative: 1 %
Eosinophils Absolute: 0.1 10*3/uL (ref 0.0–0.5)
Eosinophils Relative: 1 %
HCT: 34.3 % — ABNORMAL LOW (ref 36.0–46.0)
Hemoglobin: 10.4 g/dL — ABNORMAL LOW (ref 12.0–15.0)
Immature Granulocytes: 1 %
Lymphocytes Relative: 1 %
Lymphs Abs: 0.1 10*3/uL — ABNORMAL LOW (ref 0.7–4.0)
MCH: 28 pg (ref 26.0–34.0)
MCHC: 30.3 g/dL (ref 30.0–36.0)
MCV: 92.5 fL (ref 80.0–100.0)
Monocytes Absolute: 0.5 10*3/uL (ref 0.1–1.0)
Monocytes Relative: 7 %
Neutro Abs: 6.8 10*3/uL (ref 1.7–7.7)
Neutrophils Relative %: 89 %
Platelets: 299 10*3/uL (ref 150–400)
RBC: 3.71 MIL/uL — ABNORMAL LOW (ref 3.87–5.11)
RDW: 13.6 % (ref 11.5–15.5)
WBC: 7.6 10*3/uL (ref 4.0–10.5)
nRBC: 0 % (ref 0.0–0.2)

## 2022-12-08 LAB — URINALYSIS, ROUTINE W REFLEX MICROSCOPIC
Bilirubin Urine: NEGATIVE
Glucose, UA: NEGATIVE mg/dL
Ketones, ur: 20 mg/dL — AB
Nitrite: NEGATIVE
Protein, ur: NEGATIVE mg/dL
Specific Gravity, Urine: 1.017 (ref 1.005–1.030)
pH: 5 (ref 5.0–8.0)

## 2022-12-08 LAB — CK: Total CK: 51 U/L (ref 38–234)

## 2022-12-08 LAB — COMPREHENSIVE METABOLIC PANEL
ALT: 9 U/L (ref 0–44)
AST: 18 U/L (ref 15–41)
Albumin: 3.4 g/dL — ABNORMAL LOW (ref 3.5–5.0)
Alkaline Phosphatase: 50 U/L (ref 38–126)
Anion gap: 9 (ref 5–15)
BUN: 16 mg/dL (ref 8–23)
CO2: 24 mmol/L (ref 22–32)
Calcium: 8.8 mg/dL — ABNORMAL LOW (ref 8.9–10.3)
Chloride: 106 mmol/L (ref 98–111)
Creatinine, Ser: 1.48 mg/dL — ABNORMAL HIGH (ref 0.44–1.00)
GFR, Estimated: 34 mL/min — ABNORMAL LOW (ref >60)
Glucose, Bld: 117 mg/dL — ABNORMAL HIGH (ref 70–99)
Potassium: 4 mmol/L (ref 3.5–5.1)
Sodium: 139 mmol/L (ref 135–145)
Total Bilirubin: 0.6 mg/dL (ref 0.3–1.2)
Total Protein: 6.9 g/dL (ref 6.5–8.1)

## 2022-12-08 LAB — PROTIME-INR
INR: 1.5 — ABNORMAL HIGH (ref 0.8–1.2)
Prothrombin Time: 17.9 seconds — ABNORMAL HIGH (ref 11.4–15.2)

## 2022-12-08 LAB — I-STAT CHEM 8, ED
BUN: 18 mg/dL (ref 8–23)
Calcium, Ion: 1.1 mmol/L — ABNORMAL LOW (ref 1.15–1.40)
Chloride: 104 mmol/L (ref 98–111)
Creatinine, Ser: 1.4 mg/dL — ABNORMAL HIGH (ref 0.44–1.00)
Glucose, Bld: 110 mg/dL — ABNORMAL HIGH (ref 70–99)
HCT: 28 % — ABNORMAL LOW (ref 36.0–46.0)
Hemoglobin: 9.5 g/dL — ABNORMAL LOW (ref 12.0–15.0)
Potassium: 4.3 mmol/L (ref 3.5–5.1)
Sodium: 139 mmol/L (ref 135–145)
TCO2: 26 mmol/L (ref 22–32)

## 2022-12-08 LAB — MAGNESIUM: Magnesium: 1.9 mg/dL (ref 1.7–2.4)

## 2022-12-08 LAB — RESP PANEL BY RT-PCR (RSV, FLU A&B, COVID)  RVPGX2
Influenza A by PCR: NEGATIVE
Influenza B by PCR: NEGATIVE
Resp Syncytial Virus by PCR: NEGATIVE
SARS Coronavirus 2 by RT PCR: POSITIVE — AB

## 2022-12-08 LAB — LACTIC ACID, PLASMA: Lactic Acid, Venous: 0.9 mmol/L (ref 0.5–1.9)

## 2022-12-08 MED ORDER — LACTATED RINGERS IV BOLUS (SEPSIS)
500.0000 mL | Freq: Once | INTRAVENOUS | Status: AC
  Administered 2022-12-08: 500 mL via INTRAVENOUS

## 2022-12-08 MED ORDER — MOLNUPIRAVIR EUA 200MG CAPSULE
4.0000 | ORAL_CAPSULE | Freq: Two times a day (BID) | ORAL | Status: DC
  Administered 2022-12-08 – 2022-12-11 (×6): 800 mg via ORAL
  Filled 2022-12-08 (×2): qty 4

## 2022-12-08 NOTE — ED Triage Notes (Signed)
Pt BIB EMS from [facility] with c/o of positive for COVID. Sx N/V, cough, fever. Pt potentially fallen but pt denies falling stating she "wanted to be on the floor". Hx of dementia.   20G RFA '4mg'$  zofran 100cc fluid HR 110 BP 142/90 95% RA CBG 163

## 2022-12-08 NOTE — ED Provider Notes (Signed)
[facility] DEPT Provider Note   CSN: [id number] Arrival date & time: 12/08/22  1848     History {Add pertinent medical, surgical, social history, OB history to HPI:1} No chief complaint on file.   Leslie Carr is a 88 y.o. female.  HPI Patient presents for***.  Medical history includes HLD, CVA, atrial fibrillation, aortic aneurysm, CHF, CAD.  Patient states that she is unaware why she is here.  She currently endorses nausea.  She denies any current areas of discomfort.  She denies any current shortness of breath.   History per daughter: Patient resides in [facility] assisted living facility.  She was in her normal state of health yesterday.  She has short-term memory deficits at baseline.  This evening, patient's daughter received a call from the facility stating that the patient had a fever and tested positive for COVID.  She also had a fall and seemed confused.     Home Medications Prior to Admission medications   Medication Sig Start Date End Date Taking? Authorizing Provider  acetaminophen (TYLENOL) 500 MG tablet Take 500 mg by mouth as needed. For pain when needed.    [provider]  apixaban (ELIQUIS) 5 MG TABS tablet Take 1 tablet (5 mg total) by mouth 2 (two) times daily. 02/06/20   Geradine Girt, DO  aspirin EC 81 MG EC tablet Take 1 tablet (81 mg total) by mouth daily. 02/06/20   Geradine Girt, DO  docusate sodium (COLACE) 100 MG capsule Take 100 mg by mouth every evening.    [provider]  pravastatin (PRAVACHOL) 40 MG tablet Take 40 mg by mouth daily. 01/02/20   [provider]      Allergies    Lipitor [atorvastatin]    Review of Systems   Review of Systems  Unable to perform ROS: Dementia  Constitutional:  Positive for fever.  Gastrointestinal:  Positive for nausea.  Psychiatric/Behavioral:  Positive for confusion.     Physical Exam Updated Vital Signs There were no vitals taken for this  visit. Physical Exam Vitals and nursing note reviewed.  Constitutional:      General: She is not in acute distress.    Appearance: Normal appearance. She is well-developed. She is not ill-appearing, toxic-appearing or diaphoretic.  HENT:     Head: Normocephalic and atraumatic.     Right Ear: External ear normal.     Left Ear: External ear normal.     Mouth/Throat:     Mouth: Mucous membranes are dry.  Eyes:     Extraocular Movements: Extraocular movements intact.     Conjunctiva/sclera: Conjunctivae normal.  Cardiovascular:     Rate and Rhythm: Normal rate. Rhythm irregular.     Heart sounds: Murmur heard.  Pulmonary:     Effort: Pulmonary effort is normal. No respiratory distress.     Breath sounds: Normal breath sounds. No wheezing or rales.  Chest:     Chest wall: No tenderness.  Abdominal:     General: There is no distension.     Palpations: Abdomen is soft.     Tenderness: There is no abdominal tenderness.  Musculoskeletal:        General: Tenderness (BLE calf muscles) present. No swelling or deformity.     Cervical back: Normal range of motion and neck supple. No rigidity.     Right lower leg: No edema.     Left lower leg: No edema.  Skin:    General: Skin is warm  and dry.     Capillary Refill: Capillary refill takes less than 2 seconds.     Coloration: Skin is not jaundiced or pale.  Neurological:     General: No focal deficit present.     Mental Status: She is alert. She is disoriented.  Psychiatric:        Mood and Affect: Mood normal.        Behavior: Behavior normal.     ED Results / Procedures / Treatments   Labs (all labs ordered are listed, but only abnormal results are displayed) Labs Reviewed - No data to display  EKG None  Radiology No results found.  Procedures Procedures  {Document cardiac monitor, telemetry assessment procedure when appropriate:1}  Medications Ordered in ED Medications - No data to display  ED Course/ Medical Decision  Making/ A&P                           Medical Decision Making Amount and/or Complexity of Data Reviewed Labs: ordered. Radiology: ordered. ECG/medicine tests: ordered.   This patient presents to the ED for concern of ***, this involves an extensive number of treatment options, and is a complaint that carries with it a high risk of complications and morbidity.  The differential diagnosis includes ***   Co morbidities that complicate the patient evaluation  ***   Additional history obtained:  Additional history obtained from *** External records from outside source obtained and reviewed including ***   Lab Tests:  I Ordered, and personally interpreted labs.  The pertinent results include:  ***   Imaging Studies ordered:  I ordered imaging studies including ***  I independently visualized and interpreted imaging which showed *** I agree with the radiologist interpretation   Cardiac Monitoring: / EKG:  The patient was maintained on a cardiac monitor.  I personally viewed and interpreted the cardiac monitored which showed an underlying rhythm of: ***   Consultations Obtained:  I requested consultation with the ***,  and discussed lab and imaging findings as well as pertinent plan - they recommend: ***   Problem List / ED Course / Critical interventions / Medication management  *** I ordered medication including ***  for ***  Reevaluation of the patient after these medicines showed that the patient {resolved/improved/worsened:23923::"improved"} I have reviewed the patients home medicines and have made adjustments as needed   Social Determinants of Health:  ***   Test / Admission - Considered:  ***   {Document critical care time when appropriate:1} {Document review of labs and clinical decision tools ie heart score, Chads2Vasc2 etc:1}  {Document your independent review of radiology images, and any outside records:1} {Document your discussion with family  members, caretakers, and with consultants:1} {Document social determinants of health affecting pt's care:1} {Document your decision making why or why not admission, treatments were needed:1} Final Clinical Impression(s) / ED Diagnoses Final diagnoses:  None    Rx / DC Orders ED Discharge Orders     None

## 2022-12-09 ENCOUNTER — Emergency Department (HOSPITAL_COMMUNITY): Payer: Medicare Other

## 2022-12-09 DIAGNOSIS — M4319 Spondylolisthesis, multiple sites in spine: Secondary | ICD-10-CM | POA: Diagnosis not present

## 2022-12-09 DIAGNOSIS — I509 Heart failure, unspecified: Secondary | ICD-10-CM | POA: Diagnosis present

## 2022-12-09 DIAGNOSIS — N1832 Chronic kidney disease, stage 3b: Secondary | ICD-10-CM | POA: Diagnosis present

## 2022-12-09 DIAGNOSIS — Z7901 Long term (current) use of anticoagulants: Secondary | ICD-10-CM | POA: Diagnosis not present

## 2022-12-09 DIAGNOSIS — Z8249 Family history of ischemic heart disease and other diseases of the circulatory system: Secondary | ICD-10-CM | POA: Diagnosis not present

## 2022-12-09 DIAGNOSIS — Z87891 Personal history of nicotine dependence: Secondary | ICD-10-CM | POA: Diagnosis not present

## 2022-12-09 DIAGNOSIS — G9340 Encephalopathy, unspecified: Secondary | ICD-10-CM | POA: Diagnosis present

## 2022-12-09 DIAGNOSIS — F0390 Unspecified dementia without behavioral disturbance: Secondary | ICD-10-CM | POA: Diagnosis present

## 2022-12-09 DIAGNOSIS — I7121 Aneurysm of the ascending aorta, without rupture: Secondary | ICD-10-CM | POA: Diagnosis present

## 2022-12-09 DIAGNOSIS — U071 COVID-19: Secondary | ICD-10-CM

## 2022-12-09 DIAGNOSIS — A419 Sepsis, unspecified organism: Secondary | ICD-10-CM | POA: Diagnosis not present

## 2022-12-09 DIAGNOSIS — K449 Diaphragmatic hernia without obstruction or gangrene: Secondary | ICD-10-CM | POA: Diagnosis not present

## 2022-12-09 DIAGNOSIS — G9341 Metabolic encephalopathy: Secondary | ICD-10-CM | POA: Diagnosis present

## 2022-12-09 DIAGNOSIS — I480 Paroxysmal atrial fibrillation: Secondary | ICD-10-CM | POA: Diagnosis present

## 2022-12-09 DIAGNOSIS — I2510 Atherosclerotic heart disease of native coronary artery without angina pectoris: Secondary | ICD-10-CM | POA: Diagnosis present

## 2022-12-09 DIAGNOSIS — I700 Atherosclerosis of aorta: Secondary | ICD-10-CM | POA: Diagnosis present

## 2022-12-09 DIAGNOSIS — Z7401 Bed confinement status: Secondary | ICD-10-CM | POA: Diagnosis not present

## 2022-12-09 DIAGNOSIS — Z853 Personal history of malignant neoplasm of breast: Secondary | ICD-10-CM | POA: Diagnosis not present

## 2022-12-09 DIAGNOSIS — I7143 Infrarenal abdominal aortic aneurysm, without rupture: Secondary | ICD-10-CM | POA: Diagnosis present

## 2022-12-09 DIAGNOSIS — Z90710 Acquired absence of both cervix and uterus: Secondary | ICD-10-CM | POA: Diagnosis not present

## 2022-12-09 DIAGNOSIS — E041 Nontoxic single thyroid nodule: Secondary | ICD-10-CM | POA: Diagnosis not present

## 2022-12-09 DIAGNOSIS — R112 Nausea with vomiting, unspecified: Secondary | ICD-10-CM | POA: Diagnosis not present

## 2022-12-09 DIAGNOSIS — Z955 Presence of coronary angioplasty implant and graft: Secondary | ICD-10-CM | POA: Diagnosis not present

## 2022-12-09 DIAGNOSIS — I714 Abdominal aortic aneurysm, without rupture, unspecified: Secondary | ICD-10-CM | POA: Diagnosis not present

## 2022-12-09 DIAGNOSIS — R531 Weakness: Secondary | ICD-10-CM | POA: Diagnosis not present

## 2022-12-09 DIAGNOSIS — I728 Aneurysm of other specified arteries: Secondary | ICD-10-CM | POA: Diagnosis not present

## 2022-12-09 DIAGNOSIS — I69354 Hemiplegia and hemiparesis following cerebral infarction affecting left non-dominant side: Secondary | ICD-10-CM | POA: Diagnosis not present

## 2022-12-09 DIAGNOSIS — N179 Acute kidney failure, unspecified: Secondary | ICD-10-CM | POA: Diagnosis present

## 2022-12-09 DIAGNOSIS — Z7982 Long term (current) use of aspirin: Secondary | ICD-10-CM | POA: Diagnosis not present

## 2022-12-09 DIAGNOSIS — E860 Dehydration: Secondary | ICD-10-CM | POA: Diagnosis present

## 2022-12-09 DIAGNOSIS — S0990XA Unspecified injury of head, initial encounter: Secondary | ICD-10-CM | POA: Diagnosis not present

## 2022-12-09 DIAGNOSIS — J9811 Atelectasis: Secondary | ICD-10-CM | POA: Diagnosis not present

## 2022-12-09 DIAGNOSIS — I6523 Occlusion and stenosis of bilateral carotid arteries: Secondary | ICD-10-CM | POA: Diagnosis not present

## 2022-12-09 DIAGNOSIS — E7800 Pure hypercholesterolemia, unspecified: Secondary | ICD-10-CM | POA: Diagnosis present

## 2022-12-09 DIAGNOSIS — R410 Disorientation, unspecified: Secondary | ICD-10-CM | POA: Diagnosis not present

## 2022-12-09 DIAGNOSIS — S199XXA Unspecified injury of neck, initial encounter: Secondary | ICD-10-CM | POA: Diagnosis not present

## 2022-12-09 DIAGNOSIS — J90 Pleural effusion, not elsewhere classified: Secondary | ICD-10-CM | POA: Diagnosis not present

## 2022-12-09 LAB — COMPREHENSIVE METABOLIC PANEL
ALT: 7 U/L (ref 0–44)
AST: 16 U/L (ref 15–41)
Albumin: 2.8 g/dL — ABNORMAL LOW (ref 3.5–5.0)
Alkaline Phosphatase: 45 U/L (ref 38–126)
Anion gap: 7 (ref 5–15)
BUN: 15 mg/dL (ref 8–23)
CO2: 22 mmol/L (ref 22–32)
Calcium: 7.5 mg/dL — ABNORMAL LOW (ref 8.9–10.3)
Chloride: 107 mmol/L (ref 98–111)
Creatinine, Ser: 1.17 mg/dL — ABNORMAL HIGH (ref 0.44–1.00)
GFR, Estimated: 45 mL/min — ABNORMAL LOW (ref >60)
Glucose, Bld: 90 mg/dL (ref 70–99)
Potassium: 3.4 mmol/L — ABNORMAL LOW (ref 3.5–5.1)
Sodium: 136 mmol/L (ref 135–145)
Total Bilirubin: 0.7 mg/dL (ref 0.3–1.2)
Total Protein: 5.6 g/dL — ABNORMAL LOW (ref 6.5–8.1)

## 2022-12-09 LAB — CBC
HCT: 30 % — ABNORMAL LOW (ref 36.0–46.0)
Hemoglobin: 9.2 g/dL — ABNORMAL LOW (ref 12.0–15.0)
MCH: 28 pg (ref 26.0–34.0)
MCHC: 30.7 g/dL (ref 30.0–36.0)
MCV: 91.2 fL (ref 80.0–100.0)
Platelets: 253 10*3/uL (ref 150–400)
RBC: 3.29 MIL/uL — ABNORMAL LOW (ref 3.87–5.11)
RDW: 13.4 % (ref 11.5–15.5)
WBC: 5.8 10*3/uL (ref 4.0–10.5)
nRBC: 0 % (ref 0.0–0.2)

## 2022-12-09 LAB — MAGNESIUM: Magnesium: 1.8 mg/dL (ref 1.7–2.4)

## 2022-12-09 LAB — PHOSPHORUS: Phosphorus: 3.7 mg/dL (ref 2.5–4.6)

## 2022-12-09 LAB — C-REACTIVE PROTEIN: CRP: 7.5 mg/dL — ABNORMAL HIGH (ref <1.0)

## 2022-12-09 LAB — D-DIMER, QUANTITATIVE: D-Dimer, Quant: 0.89 ug/mL-FEU — ABNORMAL HIGH (ref 0.00–0.50)

## 2022-12-09 MED ORDER — PRAVASTATIN SODIUM 40 MG PO TABS
40.0000 mg | ORAL_TABLET | Freq: Every day | ORAL | Status: DC
  Administered 2022-12-09 – 2022-12-11 (×3): 40 mg via ORAL
  Filled 2022-12-09: qty 1
  Filled 2022-12-09: qty 2
  Filled 2022-12-09: qty 1

## 2022-12-09 MED ORDER — HALOPERIDOL LACTATE 5 MG/ML IJ SOLN
1.0000 mg | Freq: Four times a day (QID) | INTRAMUSCULAR | Status: DC | PRN
  Administered 2022-12-09 – 2022-12-10 (×4): 1 mg via INTRAVENOUS
  Filled 2022-12-09 (×4): qty 1

## 2022-12-09 MED ORDER — HALOPERIDOL LACTATE 5 MG/ML IJ SOLN
1.0000 mg | INTRAMUSCULAR | Status: AC
  Administered 2022-12-09: 1 mg via INTRAVENOUS
  Filled 2022-12-09: qty 1

## 2022-12-09 MED ORDER — ACETAMINOPHEN 500 MG PO TABS
500.0000 mg | ORAL_TABLET | Freq: Four times a day (QID) | ORAL | Status: DC | PRN
  Filled 2022-12-09: qty 1

## 2022-12-09 MED ORDER — DOCUSATE SODIUM 100 MG PO CAPS
100.0000 mg | ORAL_CAPSULE | Freq: Every evening | ORAL | Status: DC
  Filled 2022-12-09 (×2): qty 1

## 2022-12-09 MED ORDER — LACTATED RINGERS IV SOLN: INTRAVENOUS | Status: DC

## 2022-12-09 MED ORDER — VITAMIN C 500 MG PO TABS
500.0000 mg | ORAL_TABLET | Freq: Every day | ORAL | Status: DC
  Administered 2022-12-09 – 2022-12-11 (×3): 500 mg via ORAL
  Filled 2022-12-09 (×3): qty 1

## 2022-12-09 MED ORDER — SODIUM CHLORIDE 0.9 % IV SOLN: 200.0000 mg | Freq: Once | INTRAVENOUS | Status: DC

## 2022-12-09 MED ORDER — GUAIFENESIN-DM 100-10 MG/5ML PO SYRP: 10.0000 mL | ORAL_SOLUTION | ORAL | Status: DC | PRN

## 2022-12-09 MED ORDER — ZINC SULFATE 220 (50 ZN) MG PO CAPS
220.0000 mg | ORAL_CAPSULE | Freq: Every day | ORAL | Status: DC
  Administered 2022-12-09 – 2022-12-11 (×3): 220 mg via ORAL
  Filled 2022-12-09 (×3): qty 1

## 2022-12-09 MED ORDER — APIXABAN 5 MG PO TABS
5.0000 mg | ORAL_TABLET | Freq: Two times a day (BID) | ORAL | Status: DC
  Administered 2022-12-09 – 2022-12-11 (×5): 5 mg via ORAL
  Filled 2022-12-09 (×5): qty 1

## 2022-12-09 MED ORDER — ADULT MULTIVITAMIN W/MINERALS CH
1.0000 | ORAL_TABLET | Freq: Every day | ORAL | Status: DC
  Administered 2022-12-09 – 2022-12-11 (×3): 1 via ORAL
  Filled 2022-12-09 (×3): qty 1

## 2022-12-09 MED ORDER — SODIUM CHLORIDE 0.9 % IV SOLN: 100.0000 mg | Freq: Every day | INTRAVENOUS | Status: DC

## 2022-12-09 NOTE — Evaluation (Signed)
Occupational Therapy Evaluation Patient Details Name: Leslie Carr MRN: [id number] DOB: [birth year] Today's Date: 12/09/2022   History of Present Illness patient is a 88 y.o. female who presented to [facility] ED 12/08/22  from assisted living facility due to fever and a positive COVID-19 screening test at the facility.PMH: dementia, CKD 3B, paroxysmal A-fib on Eliquis, thoracic aortic aneurysm without rupture,   Clinical Impression   Patient is a 88 year old female who was admitted for above. Patient was living at ALF with unclear amount of support at baseline. No family in room at time of evaluation. Patient was +2 for bed mobility with patient not putting forth effort to engage in task.  Patient will need 24/7 caregiver support in next level of care. Patient was noted to have decreased functional activity tolernace, decreased ROM, decreased BUE strength, decreased endurance, decreased sitting balance, decreased standing balanced, decreased safety awareness, and decreased knowledge of AE/AD impacting participation in ADLs. Patient would continue to benefit from skilled OT services at this time while admitted and after d/c to address noted deficits in order to improve overall safety and independence in ADLs.       Recommendations for follow up therapy are one component of a multi-disciplinary discharge planning process, led by the attending physician.  Recommendations may be updated based on patient status, additional functional criteria and insurance authorization.   Follow Up Recommendations  Home health OT (at ALF)     Assistance Recommended at Discharge Frequent or constant Supervision/Assistance  Patient can return home with the following A lot of help with bathing/dressing/bathroom;Assistance with cooking/housework;Direct supervision/assist for medications management;Assist for transportation;Help with stairs or ramp for entrance;Direct supervision/assist for financial management;Assistance  with feeding;A lot of help with walking and/or transfers    Functional Status Assessment  Patient has had a recent decline in their functional status and demonstrates the ability to make significant improvements in function in a reasonable and predictable amount of time.  Equipment Recommendations  None recommended by OT    Recommendations for Other Services       Precautions / Restrictions Precautions Precautions: Fall Precaution Comments: incontinence Restrictions Weight Bearing Restrictions: No      Mobility Bed Mobility Overal bed mobility: Needs Assistance Bed Mobility: Rolling Rolling: Mod assist, +2 for physical assistance, +2 for safety/equipment         General bed mobility comments: multimodal cues for rolling to change brief, patient somewhat resistive at times, stating that she was cold    Transfers                   General transfer comment: patient not participating in  attrempts for mobility other than rolling.      Balance                                           ADL either performed or assessed with clinical judgement   ADL Overall ADL's : Needs assistance/impaired   Eating/Feeding Details (indicate cue type and reason): patient declined to participate in eating with breakfast tray still in room. declined to take sip of water. Grooming: Maximal assistance;Bed level   Upper Body Bathing: Bed level;Maximal assistance   Lower Body Bathing: Bed level;Maximal assistance   Upper Body Dressing : Bed level;Maximal assistance   Lower Body Dressing: Bed level;Maximal assistance     Toilet Transfer Details (indicate cue type and reason):  patient was +2 for bed mobility with increased cues for sequencing. Toileting- Clothing Manipulation and Hygiene: +2 for safety/equipment;+2 for physical assistance;Total assistance;Bed level               Vision   Additional Comments: difficult to assess with patient not forthcoming  with information when asked direct questions     Perception     Praxis      Pertinent Vitals/Pain Pain Assessment Pain Assessment: Faces Faces Pain Scale: Hurts a little bit Pain Location: patient called out being cold multiple times during session Pain Intervention(s): Monitored during session     Hand Dominance Right   Extremity/Trunk Assessment Upper Extremity Assessment Upper Extremity Assessment: Generalized weakness;Difficult to assess due to impaired cognition   Lower Extremity Assessment Lower Extremity Assessment: Defer to PT evaluation   Cervical / Trunk Assessment Cervical / Trunk Assessment: Normal   Communication Communication Communication: No difficulties   Cognition Arousal/Alertness: Lethargic Behavior During Therapy: Flat affect Overall Cognitive Status: No family/caregiver present to determine baseline cognitive functioning                                 General Comments: patient was inconsistent with participation in session and was mildly appropirate with conversation during session     General Comments  TBA    Exercises     Shoulder Instructions      Home Living Family/patient expects to be discharged to:: Assisted living                                        Prior Functioning/Environment               Mobility Comments: unsure of ambulatory status, patient unable to give info ADLs Comments: unclear ammount of assistance and PLOF with patient unable to give info. when asked do you live at  [facility] [facility] [facility]? patient responded " yeah what about it?"        OT Problem List: Decreased activity tolerance;Impaired balance (sitting and/or standing);Decreased coordination;Decreased safety awareness;Decreased knowledge of precautions;Decreased knowledge of use of DME or AE      OT Treatment/Interventions: Self-care/ADL training;Energy conservation;Therapeutic exercise;DME and/or AE instruction;Therapeutic  activities;Patient/family education;Balance training    OT Goals(Current goals can be found in the care plan section) Acute Rehab OT Goals Patient Stated Goal: none stated OT Goal Formulation: Patient unable to participate in goal setting Time For Goal Achievement: 12/23/22 Potential to Achieve Goals: Fair  OT Frequency: Min 2X/week    Co-evaluation PT/OT/SLP Co-Evaluation/Treatment: Yes Reason for Co-Treatment: Necessary to address cognition/behavior during functional activity;To address functional/ADL transfers PT goals addressed during session: Mobility/safety with mobility OT goals addressed during session: ADL's and self-care      AM-PAC OT "6 Clicks" Daily Activity     Outcome Measure Help from another person eating meals?: A Little Help from another person taking care of personal grooming?: A Little Help from another person toileting, which includes using toliet, bedpan, or urinal?: Total Help from another person bathing (including washing, rinsing, drying)?: Total Help from another person to put on and taking off regular upper body clothing?: Total Help from another person to put on and taking off regular lower body clothing?: Total 6 Click Score: 10   End of Session Equipment Utilized During Treatment: Gait belt;Rolling walker (2 wheels)  Activity Tolerance: Patient tolerated  treatment well Patient left: in bed;with call bell/phone within reach  OT Visit Diagnosis: Other abnormalities of gait and mobility (R26.89);Other symptoms and signs involving cognitive function;Muscle weakness (generalized) (M62.81)                Time: 3335-4562 OT Time Calculation (min): 19 min Charges:  OT General Charges $OT Visit: 1 Visit OT Evaluation $OT Eval Moderate Complexity: 1 Mod  Tyrrell Stephens OTR/L, MS Acute Rehabilitation Department Office# [phone]   Willa Rough 12/09/2022, 12:38 PM

## 2022-12-09 NOTE — Progress Notes (Signed)
  Progress Note   Patient: Leslie Carr GDJ:[id number] DOB: Jan 18, 1931 DOA: 12/08/2022     0 DOS: the patient was seen and examined on 12/09/2022   Brief hospital course: 88 y.o. female with medical history significant for dementia, CKD 3B, paroxysmal A-fib on Eliquis, thoracic aortic aneurysm without rupture, follows with cardiothoracic surgery, who presented to [facility] ED from assisted living facility due to fever and a positive COVID-19 screening test at the facility.  Prior to these findings, she had been feeling nauseated with vomiting.  Reportedly more disoriented thank usual with generalized weakness and a possible fall today.  Was without complaints yesterday.  The facility called EMS.     In the ED, a COVID-19 screening test was confirmed positive.  She had a chest x-ray which revealed right lung mass versus consolidation.  Subsequently, a CT scan of the chest was done and it showed trace left pleural effusion with atelectasis in the lungs bilaterally.  Right upper and lower lobe pulmonary nodules and right middle lobe partially calcified pulmonary mass unchanged.  Aortic atherosclerosis with aneurysmal dilatation of the ascending aorta measuring 4.4 cm.  Stable infrarenal abdominal aortic aneurysm measuring 4 cm.   She was started on Molnupiravir in the ED and received 500 cc IV fluid bolus lactated ringer.  TRH, hospitalist service, was asked to admit.  Assessment and Plan: COVID-19 viral infection COVID-19 screening test positive on 12/08/2022. Started on Molnupiravir in ED, will continue for now Multivitamins, vitamin C, zinc Antitussives as needed Maintain O2 saturation greater than 90%. CRP 7.5 with ddimer 0.89. Follow inflammatory markers   Acute metabolic encephalopathy in the setting of COVID-19 viral infection Reorient as needed EGK reviewed, QTc 480. Will cont on PRN haldol Fall and aspiration precautions.   Paroxysmal A-fib on Eliquis Continued on eliquis Monitor on  telemetry.   CKD 3B Monitor urine output Clinically dehydrated on exam, now on IVF Cr down to 1.17 this AM Avoid nephrotoxic agents, dehydration and hypotension.   Pulmonary nodules, pulmonary mass Outpatient surveillance  Dehydration -Dry membranes, poor skin turgor on exam -Will cont on 75cc LR -recheck bmet in AM      Subjective: Confused this AM. Repeating "get out of here"  Physical Exam: Vitals:   12/09/22 1030 12/09/22 1200 12/09/22 1331 12/09/22 1415  BP: 122/70 109/71  112/74  Pulse: 79 90  99  Resp: (!) 25 (!) 24  17  Temp:   98.9 F (37.2 C)   TempSrc:   Oral   SpO2: 99% 91%  96%   General exam: Awake, laying in bed, in nad Respiratory system: Normal respiratory effort, no audible wheezing Cardiovascular system: regular rate, perfused Gastrointestinal system: Soft, nondistended, positive BS Central nervous system: CN2-12 grossly intact, strength intact Extremities: Perfused, no clubbing Skin: Normal skin turgor, no notable skin lesions seen Psychiatry: difficult to assess, confused  Data Reviewed:  Labs reviewed: Na 136, K 3.4, Cr 1.17, WBC 5.8, Hgb 9.2  Family Communication: Pt in room, family not at bedside  Disposition: Status is: Inpatient Remains inpatient appropriate because: Severity of illness  Planned Discharge Destination:  ALF     Author: Marylu Lund, MD 12/09/2022 3:29 PM  For on call review [email or web address].

## 2022-12-09 NOTE — Progress Notes (Signed)
PT refused flutter valve

## 2022-12-09 NOTE — Hospital Course (Signed)
88 y.o. female with medical history significant for dementia, CKD 3B, paroxysmal A-fib on Eliquis, thoracic aortic aneurysm without rupture, follows with cardiothoracic surgery, who presented to [facility] ED from assisted living facility due to fever and a positive COVID-19 screening test at the facility.  Prior to these findings, she had been feeling nauseated with vomiting.  Reportedly more disoriented thank usual with generalized weakness and a possible fall today.  Was without complaints yesterday.  The facility called EMS.     In the ED, a COVID-19 screening test was confirmed positive.  She had a chest x-ray which revealed right lung mass versus consolidation.  Subsequently, a CT scan of the chest was done and it showed trace left pleural effusion with atelectasis in the lungs bilaterally.  Right upper and lower lobe pulmonary nodules and right middle lobe partially calcified pulmonary mass unchanged.  Aortic atherosclerosis with aneurysmal dilatation of the ascending aorta measuring 4.4 cm.  Stable infrarenal abdominal aortic aneurysm measuring 4 cm.   She was started on Molnupiravir in the ED and received 500 cc IV fluid bolus lactated ringer.  TRH, hospitalist service, was asked to admit.

## 2022-12-09 NOTE — ED Notes (Signed)
Pt resting quietly with eyes closed

## 2022-12-09 NOTE — ED Notes (Signed)
Pt is having high agitation, anxiety, attempting to get out of bed, pt pulled IV out, pulled purewick off, vital sign monitoring cables. Staff attempted redirection with little success. Pt was cleaned up, new IV established. Pt was placed in mittens and posey belt for safety due to this behavior. Notified Dr.Hall DO of this and administered haldol.

## 2022-12-09 NOTE — Progress Notes (Signed)
   12/09/22 1821  Assess: MEWS Score  Temp (!) 100.6 F (38.1 C)  BP (!) 139/109  MAP (mmHg) 119  Pulse Rate (!) 106  Resp 20  SpO2 94 %  O2 Device Room Air  Assess: MEWS Score  MEWS Temp 1  MEWS Systolic 0  MEWS Pulse 1  MEWS RR 0  MEWS LOC 0  MEWS Score 2  MEWS Score Color Yellow  Assess: if the MEWS score is Yellow or Red  Were vital signs taken at a resting state? Yes  Focused Assessment No change from prior assessment  Does the patient meet 2 or more of the SIRS criteria? No  MEWS guidelines implemented *See Row Information* Yes  Treat  MEWS Interventions Administered prn meds/treatments  Pain Scale Faces  Faces Pain Scale 0  Take Vital Signs  Increase Vital Sign Frequency  Yellow: Q 2hr X 2 then Q 4hr X 2, if remains yellow, continue Q 4hrs  Escalate  MEWS: Escalate Yellow: discuss with charge nurse/RN and consider discussing with provider and RRT  Notify: Charge Nurse/RN  Name of Charge Nurse/RN Notified [place], RN  Date Charge Nurse/RN Notified 12/09/22  Time Charge Nurse/RN Notified 1830  Provider Notification  Provider Name/Title Dr. Rhetta Mura  Date Provider Notified 12/09/22  Time Provider Notified 1822  Method of Notification Call (secure chat)  Notification Reason Change in status  Provider response No new orders  Date of Provider Response 12/09/22  Time of Provider Response 1823  Document  Patient Outcome Stabilized after interventions  Progress note created (see row info) Yes  Assess: SIRS CRITERIA  SIRS Temperature  0  SIRS Pulse 1  SIRS Respirations  0  SIRS WBC 0  SIRS Score Sum  1

## 2022-12-09 NOTE — Evaluation (Signed)
Physical Therapy Evaluation Patient Details Name: Leslie Carr MRN: [id number] DOB: 20-Jun-1931 Today's Date: 12/09/2022  History of Present Illness  patient is a 88 y.o. female who presented to [facility] ED 12/08/22  from assisted living facility due to fever and a positive COVID-19 screening test at the facility.PMH: dementia, CKD 3B, paroxysmal A-fib on Eliquis, thoracic aortic aneurysm without rupture,  Clinical Impression  Pt admitted with above diagnosis.  Pt currently with functional limitations due to the deficits listed below (see PT Problem List). Pt will benefit from skilled PT to increase their independence and safety with mobility to allow discharge to the venue listed below.     The patient is limited in  participation, reporting being cold. Assisted with  bed mobility and pericare. Patient  somewhat resistant to  further  mobility.  Patient resides in ALF, unsure of functional level.     Recommendations for follow up therapy are one component of a multi-disciplinary discharge planning process, led by the attending physician.  Recommendations may be updated based on patient status, additional functional criteria and insurance authorization.  Follow Up Recommendations Home health PT (return to ALF)      Assistance Recommended at Discharge Frequent or constant Supervision/Assistance  Patient can return home with the following  A lot of help with walking and/or transfers;A lot of help with bathing/dressing/bathroom    Equipment Recommendations None recommended by PT  Recommendations for Other Services       Functional Status Assessment Patient has had a recent decline in their functional status and demonstrates the ability to make significant improvements in function in a reasonable and predictable amount of time.     Precautions / Restrictions Precautions Precautions: Fall Precaution Comments: incontinence Restrictions Weight Bearing Restrictions: No       Mobility  Bed Mobility Overal bed mobility: Needs Assistance Bed Mobility: Rolling Rolling: Mod assist, +2 for physical assistance, +2 for safety/equipment         General bed mobility comments: multimodal cues for rolling to change brief, patient somewhat resistive at times, stating that she was cold    Transfers                   General transfer comment: patient not participating in  attrempts for mobility other than rolling.    Ambulation/Gait                  Stairs            Wheelchair Mobility    Modified Rankin (Stroke Patients Only)       Balance                                             Pertinent Vitals/Pain Pain Assessment Pain Assessment: Faces Pain Location: just yells out that she is cold Pain Intervention(s): Monitored during session    Home Living Family/patient expects to be discharged to:: Assisted living                        Prior Function               Mobility Comments: unsure of ambulatory status, patient unable to give info ADLs Comments: unclear ammount of assistance and PLOF with patient unable to give info. when asked do you live at  [facility]? patient responded " yeah  what about it?"     Hand Dominance   Dominant Hand: Right    Extremity/Trunk Assessment   Upper Extremity Assessment Upper Extremity Assessment: Generalized weakness;Difficult to assess due to impaired cognition    Lower Extremity Assessment Lower Extremity Assessment: Defer to PT evaluation    Cervical / Trunk Assessment Cervical / Trunk Assessment: Normal  Communication   Communication: No difficulties  Cognition Arousal/Alertness: Awake/alert Behavior During Therapy: Flat affect Overall Cognitive Status: No family/caregiver present to determine baseline cognitive functioning                                 General Comments: follows simple directions with increased time,  inconsistently        General Comments General comments (skin integrity, edema, etc.): TBA    Exercises     Assessment/Plan    PT Assessment Patient needs continued PT services  PT Problem List Decreased strength;Decreased cognition;Decreased knowledge of precautions;Decreased mobility;Decreased activity tolerance;Decreased safety awareness       PT Treatment Interventions DME instruction;Functional mobility training;Patient/family education;Therapeutic activities;Gait training;Cognitive remediation    PT Goals (Current goals can be found in the Care Plan section)  Acute Rehab PT Goals PT Goal Formulation: Patient unable to participate in goal setting Time For Goal Achievement: 12/23/22 Potential to Achieve Goals: Fair    Frequency Min 2X/week     Co-evaluation PT/OT/SLP Co-Evaluation/Treatment: Yes Reason for Co-Treatment: Necessary to address cognition/behavior during functional activity;To address functional/ADL transfers PT goals addressed during session: Mobility/safety with mobility OT goals addressed during session: ADL's and self-care       AM-PAC PT "6 Clicks" Mobility  Outcome Measure Help needed turning from your back to your side while in a flat bed without using bedrails?: Total Help needed moving from lying on your back to sitting on the side of a flat bed without using bedrails?: Total Help needed moving to and from a bed to a chair (including a wheelchair)?: Total Help needed standing up from a chair using your arms (e.g., wheelchair or bedside chair)?: Total Help needed to walk in hospital room?: Total Help needed climbing 3-5 steps with a railing? : Total 6 Click Score: 6    End of Session   Activity Tolerance: Treatment limited secondary to agitation Patient left: in bed;with restraints reapplied Nurse Communication: Mobility status PT Visit Diagnosis: Unsteadiness on feet (R26.81);Difficulty in walking, not elsewhere classified (R26.2);History  of falling (Z91.81)    Time: 7829-5621 PT Time Calculation (min) (ACUTE ONLY): 19 min   Charges:   PT Evaluation $PT Eval Low Complexity: 1 Low          [place] Office [phone] Weekend [phone]   Claretha Cooper 12/09/2022, 12:32 PM

## 2022-12-09 NOTE — H&P (Addendum)
History and Physical  Leslie Carr JYN:[id number] DOB: Feb 28, 1931 DOA: 12/08/2022  Referring physician: Dr. Doren Custard, [place]. PCP: Carolee Rota, NP  Outpatient Specialists: Cardiothoracic surgery. Patient coming from: ALF  Chief Complaint: Fever, COVID-19 positive  HPI: Leslie Carr is a 88 y.o. female with medical history significant for dementia, CKD 3B, paroxysmal A-fib on Eliquis, thoracic aortic aneurysm without rupture, follows with cardiothoracic surgery, who presented to [facility] ED from assisted living facility due to fever and a positive COVID-19 screening test at the facility.  Prior to these findings, she had been feeling nauseated with vomiting.  Reportedly more disoriented thank usual with generalized weakness and a possible fall today.  Was without complaints yesterday.  The facility called EMS.    In the ED, a COVID-19 screening test was confirmed positive.  She had a chest x-ray which revealed right lung mass versus consolidation.  Subsequently, a CT scan of the chest was done and it showed trace left pleural effusion with atelectasis in the lungs bilaterally.  Right upper and lower lobe pulmonary nodules and right middle lobe partially calcified pulmonary mass unchanged.  Aortic atherosclerosis with aneurysmal dilatation of the ascending aorta measuring 4.4 cm.  Stable infrarenal abdominal aortic aneurysm measuring 4 cm.  She was started on Molnupiravir in the ED and received 500 cc IV fluid bolus lactated ringer.  [facility], hospitalist service, was asked to admit.  ED Course: Tmax 98.6.  BP 120/76, pulse 94, respiratory 20, saturation 91% on room air.  Lab studies remarkable for creatinine 1.4 with GFR of 34, baseline.  Hemoglobin 9.5 from 10.4.  Baseline hemoglobin 12.  Review of Systems: Review of systems as noted in the HPI. All other systems reviewed and are negative.   Past Medical History:  Diagnosis Date   Acute metabolic encephalopathy 8/65/7846   Breast cancer ([place])     CHF (congestive heart failure) ([place])    Coronary artery disease due to calcified coronary lesion 09/2018   ACS PCI of OM2 on 09/21/2018 followed by staged orbital atherectomy based PCI of ostial to distal RCA.  (Details in Surgical History)   Diastolic dysfunction with chronic heart failure (HCC)    History of bronchitis    Hypercholesteremia    Mass of middle lobe of right lung    Mild aortic stenosis by prior echocardiogram 09/2018   Mean gradient 14 million mercury, peak 24 mmHg   Numbness and tingling in left hand    Pleural effusion on left    Shortness of breath dyspnea    "only going up a flight of stairs"   Stroke [facility]) 2014   left sided weakness - mainly left hand   TIA (transient ischemic attack) 01/2020   Past Surgical History:  Procedure Laterality Date   ABDOMINAL HYSTERECTOMY  1976   CATARACT EXTRACTION Right    COLONOSCOPY     CORONARY ANGIOPLASTY WITH STENT PLACEMENT     CORONARY ATHERECTOMY N/A 09/24/2018   Procedure: [place] CORONARY STENT PLACEMENT;  Surgeon: Leonie Man, MD;  Location: [place] CV LAB;; staged PCI: Ostial RCA 70%, prox-mid RCA 70% and mid RCA 95 and 50%. ->  Orbital atherectomy followed by 3 overlapping DES stents (proximal to distal): Xience Sierra 2.75 mm a 28 mm (2.9 mm), 2.5 mm x 33 mm (2.8 mm), 2.25 mm a 12 mm (tapered from 2.6 to 2.4 mm)   CORONARY STENT INTERVENTION N/A 09/21/2018   Procedure: CORONARY STENT INTERVENTION;  Surgeon: Leonie Man, MD;  Location: [facility]  INVASIVE CV LAB;  Service: Cardiovascular;; Ostial OM2 85% (DES PCI, Xience Sierra 2.5 mm x 18 mm-minimal residual stenosis)   DECORTICATION Left 04/17/2016   Procedure: Visceral Pleural DECORTICATION;  Surgeon: Melrose Nakayama, MD;  Location: [place];  Service: Thoracic;  Laterality: Left;   ESOPHAGOGASTRODUODENOSCOPY N/A 04/20/2019   Procedure: ESOPHAGOGASTRODUODENOSCOPY (EGD);  Surgeon: Rogene Houston, MD;  [facility];  Service:  Endoscopy;  Laterality: N/A;  APC- circumferential   FOOT SURGERY Right    "bone spur removal"   LEFT HEART CATH AND CORONARY ANGIOGRAPHY N/A 09/21/2018   Procedure: LEFT HEART CATH AND CORONARY ANGIOGRAPHY;  Surgeon: Leonie Man, MD;  Location: [place] CV LAB;  Service: Cardiovascular;; Ostial OM2 85% (DES PCI), dLCx 45%.  Lesion #2-3: Ostial RCA 70%, prox-mid RCA 70% and mid RCA 95 and 50%.  Distal RCA 30%, ostial RPDA 30%.  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  GR 1 DD.  Mild aortic s stenosis (mean gradient 14 mmHg, peak gradient 24 mmHg).  Mild LA dilation.  Lipomatous hypertrophy of intra-atrial septum.;; b) February 2021: EF 60 to 65%.  No R WMA.  Mild concentric LVH.  GR 1 DD.  Normal RV function.  Mild LA dilation.  Mild calcific aortic stenosis (mean gradient 10 mmHg-but visually appears to be worse).  No change   VIDEO ASSISTED THORACOSCOPY Left 04/17/2016   Procedure:  Left VIDEO ASSISTED THORACOSCOPY with Drainage of Pleural Effusion, Pleural Biopsy, Visceral Pleural Decortication and Placement of On-Q pain pump;  Surgeon: Melrose Nakayama, MD;  Location: [facility] OR;  Service: Thoracic;  Laterality: Left;    Social History:  reports that she has quit smoking. She has never used smokeless tobacco. She reports that she does not drink alcohol and does not use drugs.   Allergies  Allergen Reactions   Lipitor [Atorvastatin] Other (See Comments)    Dizzy and make her head feel huge     Family History  Problem Relation  Age of Onset   Hypertension Mother    Hypertension Father    Heart disease Neg Hx    Heart attack Neg Hx    Hyperlipidemia Neg Hx    Heart failure Neg Hx       Prior to Admission medications   Medication Sig Start Date End Date Taking? Authorizing Provider  acetaminophen (TYLENOL) 500 MG tablet Take 500 mg by mouth as needed. For pain when needed.    [provider]  apixaban (ELIQUIS) 5 MG TABS tablet Take 1 tablet (5 mg total) by mouth 2 (two) times daily. 02/06/20   Geradine Girt, DO  aspirin EC 81 MG EC tablet Take 1 tablet (81 mg total) by mouth daily. 02/06/20   Geradine Girt, DO  docusate sodium (COLACE) 100 MG capsule Take 100 mg by mouth every evening.    [provider]  pravastatin (PRAVACHOL) 40 MG tablet Take 40 mg by mouth daily. 01/02/20   [provider]    Physical  Exam: BP (!) 140/78   Pulse 93   Temp (!) 97.5 F (36.4 C) (Oral)   Resp 20   SpO2 92%   General: 88 y.o. year-old female well developed well nourished in no acute distress.  Alert and confused. Cardiovascular: Regular rate and rhythm with no rubs or gallops.  No thyromegaly or JVD noted.  No lower extremity edema. 2/4 pulses in all 4 extremities. Respiratory: Clear to auscultation with no wheezes or rales. Good inspiratory effort. Abdomen: Soft nontender nondistended with normal bowel sounds x4 quadrants. Muskuloskeletal: No cyanosis, clubbing or edema noted bilaterally Neuro: CN II-XII intact, strength, sensation, reflexes Skin: No ulcerative lesions noted or rashes Psychiatry: Judgement and insight appear altered. Mood is appropriate for condition and setting          Labs on Admission:  Basic Metabolic Panel: Recent Labs  Lab 12/08/22 1950 12/08/22 2107  NA 139 139  K 4.0 4.3  CL 106 104  CO2 24  --   GLUCOSE 117* 110*  BUN 16 18  CREATININE 1.48* 1.40*  CALCIUM 8.8*  --   MG 1.9  --    Liver Function Tests: Recent Labs  Lab 12/08/22 1950  AST 18   ALT 9  ALKPHOS 50  BILITOT 0.6  PROT 6.9  ALBUMIN 3.4*   No results for input(s): "LIPASE", "AMYLASE" in the last 168 hours. No results for input(s): "AMMONIA" in the last 168 hours. CBC: Recent Labs  Lab 12/08/22 1950 12/08/22 2107  WBC 7.6  --   NEUTROABS 6.8  --   HGB 10.4* 9.5*  HCT 34.3* 28.0*  MCV 92.5  --   PLT 299  --    Cardiac Enzymes: Recent Labs  Lab 12/08/22 1950  CKTOTAL 51    BNP (last 3 results) No results for input(s): "BNP" in the last 8760 hours.  ProBNP (last 3 results) No results for input(s): "PROBNP" in the last 8760 hours.  CBG: No results for input(s): "GLUCAP" in the last 168 hours.  Radiological Exams on Admission: CT CHEST ABDOMEN PELVIS WO CONTRAST  Result Date: 12/09/2022 CLINICAL DATA:  Sepsis. COVID positive. Cough, nausea, vomiting, and fever. Possible fall. EXAM: CT CHEST, ABDOMEN AND PELVIS WITHOUT CONTRAST TECHNIQUE: Multidetector CT imaging of the chest, abdomen and pelvis was performed following the standard protocol without IV contrast. RADIATION DOSE REDUCTION: This exam was performed according to the departmental dose-optimization program which includes automated exposure control, adjustment of the mA and/or kV according to patient size and/or use of iterative reconstruction technique. COMPARISON:  10/31/2022. FINDINGS: CT CHEST FINDINGS Cardiovascular: The heart is normal in size and there is no pericardial effusion. Multi-vessel coronary artery calcifications are noted. There is atherosclerotic calcification of the aorta with aneurysmal dilatation of the ascending aorta measuring 4.4 cm. 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 The pulmonary trunk is normal in caliber. Mediastinum/Nodes: No mediastinal or axillary lymphadenopathy. Evaluation of the hilar regions is limited due to lack of IV contrast. A coarse calcification is noted in the left lobe of the thyroid gland. The  trachea and esophagus are within normal limits. Small hiatal hernia. Lungs/Pleura: There is a trace left pleural effusion with atelectasis at the lung bases. 1 cm nodule is noted in the apical segment of the right upper lobe, not significantly changed from the prior exam, axial image 29. There is a stable 7 mm right lower lobe pulmonary, axial image 110. There is a partially calcified mass in  the posterior aspect of the right middle lobe measuring 3.7 x 2.2, axial image 82, not significantly changed from the prior exam. Stable fibrotic changes with volume loss are noted in the left upper lobe. No pneumothorax. Musculoskeletal: Degenerative changes in the thoracic and cervical spine and left glenohumeral joint. No acute or suspicious osseous abnormality. Left mastectomy changes are noted. CT ABDOMEN PELVIS FINDINGS Hepatobiliary: No focal liver abnormality is seen. No gallstones, gallbladder wall thickening, or biliary dilatation. Pancreas: Unremarkable. No pancreatic ductal dilatation or surrounding inflammatory changes. Spleen: Normal in size without focal abnormality. Adrenals/Urinary Tract: The adrenal glands are within normal limits. No renal calculus or hydronephrosis. The bladder is unremarkable. Stomach/Bowel: Small hiatal hernia. Stomach is within normal limits. Appendix is not seen. No evidence of bowel wall thickening, distention, or inflammatory changes. No free air or pneumatosis. Scattered diverticula are present along the colon without evidence of diverticulitis. Vascular/Lymphatic: Aortic atherosclerosis with aneurysmal dilatation of the infrarenal abdominal aorta measuring 4.0 x 4.0 cm. No abdominal or pelvic lymphadenopathy. Reproductive: Status post hysterectomy. No adnexal masses. Other: No abdominopelvic ascites. Musculoskeletal: Degenerative changes in the lumbar spine. No acute osseous abnormality. IMPRESSION: 1. Trace left pleural effusion with atelectasis in the lungs bilaterally. 2. Right  upper and lower lobe pulmonary nodules and right middle lobe partially calcified pulmonary mass are unchanged. 3. Aortic atherosclerosis with aneurysmal dilatation of the ascending aorta measuring 4.4 cm. Recommend annual imaging followup by CTA or MRA. This recommendation follows 2010 ACCF/AHA/AATS/ACR/ASA/SCA/SCAI/SIR/STS/SVM Guidelines for the Diagnosis and Management of Patients with Thoracic Aortic Disease. Circulation. 2010; 121: K093-G182. Aortic aneurysm NOS (ICD10-I71.9) 4. Aortic atherosclerosis with stable infrarenal abdominal aortic aneurysm measuring 4 cm. Recommend follow-up every 12 months and vascular consultation. This recommendation follows ACR consensus guidelines: White Paper of the ACR Incidental Findings Committee II on Vascular Findings. J Am Coll Radiol 2013; 10:789-794. 5. No acute process in the abdomen and pelvis. 6. Remaining incidental findings are unchanged. Electronically Signed   By: Brett Fairy M.D.   On: 12/09/2022 01:03   CT HEAD WO CONTRAST  Result Date: 12/09/2022 CLINICAL DATA:  Head trauma, moderate-severe; Polytrauma, blunt EXAM: CT HEAD WITHOUT CONTRAST CT CERVICAL SPINE WITHOUT CONTRAST TECHNIQUE: Multidetector CT imaging of the head and cervical spine was performed following the standard protocol without intravenous contrast. Multiplanar CT image reconstructions of the cervical spine were also generated. RADIATION DOSE REDUCTION: This exam was performed according to the departmental dose-optimization program which includes automated exposure control, adjustment of the mA and/or kV according to patient size and/or use of iterative reconstruction technique. COMPARISON:  CT angiography chest '11 10 23 '$ FINDINGS: CT HEAD FINDINGS Brain: Cerebral ventricle sizes are concordant with the degree of cerebral volume loss. Patchy and confluent areas of decreased attenuation are noted throughout the deep and periventricular white matter of the cerebral hemispheres bilaterally,  compatible with chronic microvascular ischemic disease. No evidence of large-territorial acute infarction. No parenchymal hemorrhage. No mass lesion. No extra-axial collection. No mass effect or midline shift. No hydrocephalus. Basilar cisterns are patent. Vascular: No hyperdense vessel. Atherosclerotic calcifications are present within the cavernous internal carotid arteries. Skull: No acute fracture or focal lesion. Sinuses/Orbits: Paranasal sinuses and mastoid air cells are clear. The orbits are unremarkable. Other: None. CT CERVICAL SPINE FINDINGS Alignment: Grade 1 anterolisthesis of C3 on C4 and C7 on T1. Skull base and vertebrae: Multilevel moderate degenerative changes spine most prominent at the C4 through C6 levels. Associated moderate to severe osseous neural foraminal stenosis at the C4-C5 level.  No acute fracture. No aggressive appearing focal osseous lesion or focal pathologic process. Soft tissues and spinal canal: No prevertebral fluid or swelling. No visible canal hematoma. Upper chest: Unremarkable. Other: Severe atherosclerotic plaque of the visualized aortic arch. Atherosclerotic plaque of the carotid arteries within the neck. 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 1.3 cm calcified left thyroid gland nodule. In the setting of significant comorbidities or limited life expectancy, no follow-up recommended (ref: J Am Coll Radiol. 2015 Feb;12(2): 143-50). IMPRESSION: 1. No acute intracranial abnormality. 2. No acute displaced fracture or traumatic listhesis of the cervical spine. 3. Aortic Atherosclerosis (ICD10-I70.0). 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 Electronically Signed   By: Iven Finn M.D.   On: 12/09/2022 00:45   CT CERVICAL SPINE WO CONTRAST  Result Date: 12/09/2022 CLINICAL DATA:  Head trauma, moderate-severe; Polytrauma, blunt EXAM: CT HEAD WITHOUT CONTRAST  CT CERVICAL SPINE WITHOUT CONTRAST TECHNIQUE: Multidetector CT imaging of the head and cervical spine was performed following the standard protocol without intravenous contrast. Multiplanar CT image reconstructions of the cervical spine were also generated. RADIATION DOSE REDUCTION: This exam was performed according to the departmental dose-optimization program which includes automated exposure control, adjustment of the mA and/or kV according to patient size and/or use of iterative reconstruction technique. COMPARISON:  CT angiography chest '11 10 23 '$ FINDINGS: CT HEAD FINDINGS Brain: Cerebral ventricle sizes are concordant with the degree of cerebral volume loss. Patchy and confluent areas of decreased attenuation are noted throughout the deep and periventricular white matter of the cerebral hemispheres bilaterally, compatible with chronic microvascular ischemic disease. No evidence of large-territorial acute infarction. No parenchymal hemorrhage. No mass lesion. No extra-axial collection. No mass effect or midline shift. No hydrocephalus. Basilar cisterns are patent. Vascular: No hyperdense vessel. Atherosclerotic calcifications are present within the cavernous internal carotid arteries. Skull: No acute fracture or focal lesion. Sinuses/Orbits: Paranasal sinuses and mastoid air cells are clear. The orbits are unremarkable. Other: None. CT CERVICAL SPINE FINDINGS Alignment: Grade 1 anterolisthesis of C3 on C4 and C7 on T1. Skull base and vertebrae: Multilevel moderate degenerative changes spine most prominent at the C4 through C6 levels. Associated moderate to severe osseous neural foraminal stenosis at the C4-C5 level. No acute fracture. No aggressive appearing focal osseous lesion or focal pathologic process. Soft tissues and spinal canal: No prevertebral fluid or swelling. No visible canal hematoma. Upper chest: Unremarkable. Other: Severe atherosclerotic plaque of the visualized aortic arch. Atherosclerotic  plaque of the carotid arteries within the neck. 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 1.3 cm calcified left thyroid gland nodule. In the setting of significant comorbidities or limited life expectancy, no follow-up recommended (ref: J Am Coll Radiol. 2015 Feb;12(2): 143-50). IMPRESSION: 1. No acute intracranial abnormality. 2. No acute displaced fracture or traumatic listhesis of the cervical spine. 3. Aortic Atherosclerosis (ICD10-I70.0). 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 Electronically Signed   By: Iven Finn M.D.   On: 12/09/2022 00:45   DG Chest Port 1 View  Result Date: 12/08/2022 CLINICAL DATA:  Trauma EXAM: PORTABLE CHEST 1 VIEW COMPARISON:  10/31/2022. FINDINGS: Cardiac silhouette is enlarged. Aorta is ectatic tortuous and calcified. Right mid to lower lung 4.3 cm masslike area noted to be a stable finding. Left lung clear. Normal pulmonary vasculature. IMPRESSION: Right lung mass versus consolidation.  Enlarged cardiac silhouette. Electronically Signed   By: Sammie Bench  M.D.   On: 12/08/2022 20:02   DG Pelvis Portable  Result Date: 12/08/2022 CLINICAL DATA:  Trauma fall EXAM: PORTABLE PELVIS 1-2 VIEWS COMPARISON:  None Available. FINDINGS: There is no evidence of displaced pelvic fracture or diastasis. No pelvic bone lesions are seen. Nonobstructive pattern of overlying bowel gas. IMPRESSION: No displaced pelvic fracture or diastasis. Please note that plain radiographs are significantly insensitive for hip and pelvic fracture. Recommend CT or MRI to more sensitively evaluate if there is high clinical suspicion for fracture. Electronically Signed   By: Delanna Ahmadi M.D.   On: 12/08/2022 20:02    EKG: I independently viewed the EKG done and my findings are as followed: Sinus tachycardia rate of 99.  Nonspecific ST-T changes.  QTc 480.  Assessment/Plan Present  on Admission:  COVID-19 virus infection  Principal Problem:   COVID-19 virus infection  COVID-19 viral infection COVID-19 screening test positive on 12/08/2022. Started on Molnupiravir Multivitamins, vitamin C, zinc Antitussives as needed Maintain O2 saturation greater than 90%. Follow inflammatory markers.  Acute metabolic encephalopathy in the setting of COVID-19 viral infection Treat underlying condition Reorient as needed Fall and aspiration precautions. Received a dose of IV Haldol 1 mg x 1 due to agitation, for patient's own safety.  Paroxysmal A-fib on Eliquis Restart home regimen. Monitor on telemetry.  CKD 3B At baseline Monitor urine output Avoid nephrotoxic agents, dehydration and hypotension.  Pulmonary nodules, pulmonary mass Outpatient surveillance   DVT prophylaxis: Home Eliquis  Code Status: Full code  Family Communication: None at bedside  Disposition Plan: Admitted to telemetry unit  Consults called: None.  Admission status: Inpatient status.   Status is: Inpatient The patient requires at least 2 midnights for further evaluation and treatment of present condition.     Kayleen Memos MD [facility] Pager [phone]  If 7PM-7AM, please contact night-coverage [email or web address] Password [facility]  12/09/2022, 4:33 AM

## 2022-12-10 DIAGNOSIS — U071 COVID-19: Secondary | ICD-10-CM | POA: Diagnosis not present

## 2022-12-10 DIAGNOSIS — F0390 Unspecified dementia without behavioral disturbance: Secondary | ICD-10-CM | POA: Insufficient documentation

## 2022-12-10 LAB — C-REACTIVE PROTEIN: CRP: 8 mg/dL — ABNORMAL HIGH (ref <1.0)

## 2022-12-10 LAB — URINE CULTURE: Culture: NO GROWTH

## 2022-12-10 LAB — D-DIMER, QUANTITATIVE: D-Dimer, Quant: 0.86 ug/mL-FEU — ABNORMAL HIGH (ref 0.00–0.50)

## 2022-12-10 MED ORDER — MIRTAZAPINE 15 MG PO TABS
7.5000 mg | ORAL_TABLET | Freq: Every day | ORAL | Status: DC
  Administered 2022-12-10: 7.5 mg via ORAL
  Filled 2022-12-10: qty 1

## 2022-12-10 MED ORDER — POTASSIUM CHLORIDE CRYS ER 20 MEQ PO TBCR
40.0000 meq | EXTENDED_RELEASE_TABLET | Freq: Once | ORAL | Status: AC
  Administered 2022-12-10: 40 meq via ORAL
  Filled 2022-12-10: qty 2

## 2022-12-10 NOTE — TOC Initial Note (Signed)
Transition of Care [facility]) - Initial/Assessment Note    Patient Details  Name: Leslie Carr MRN: [id number] Date of Birth: 1931/01/02  Transition of Care [facility]) CM/SW Contact:    Vassie Moselle, LCSW Phone Number: 12/10/2022, 11:33 AM  Clinical Narrative:                 Spoke with pt's daughter who shares pt is a resident at [facility]. She reports pt has been receiving PT services 3x a week through [facility] [facility] and is agreeable for these services to continue at discharge. CSW spoke with Director of [place] and confirmed pt is able to return at discharge. FL-2 and discharge summary will need to be faxed to their facility prior to discharge. Contacted Regina w/ [facility] to confirm HHPT/OT services. [place] orders will need to be faxed to [facility] at [phone] prior to discharge.   Planned Disposition: ALF (w/ home health) Barriers to Discharge: No Barriers Identified   Patient Goals and CMS Choice Patient states their goals for this hospitalization and ongoing recovery are:: For pt to return to [facility] Medicare.gov Compare Post Acute Care list provided to:: Patient Represenative (must comment) Choice offered to / list presented to : [facility] POA / Guardian, Adult Children      Expected Discharge Plan and Services Planned Disposition: ALF (w/ home health) In-house Referral: NA Discharge Planning Services: CM Consult Post Acute Care Choice: [place] arrangements for the past 2 months: [place]                 DME Arranged: N/A DME Agency: NA       HH Arranged: PT, OT [place] Agency: Other - See comment Secondary school teacher)        Prior Living Arrangements/Services Living arrangements for the past 2 months: [place] Lives with:: Facility Resident Patient language and need for interpreter reviewed:: Yes Do you feel safe going back to the place where you live?: Yes      Need for Family Participation in Patient Care: Yes (Comment) Care giver  support system in place?: Yes (comment) Current home services: Home PT, Home OT Criminal Activity/Legal Involvement Pertinent to Current Situation/Hospitalization: No - Comment as needed  Activities of Daily Living      Permission Sought/Granted Permission sought to share information with : Facility Sport and exercise psychologist, Family Supports Permission granted to share information with : Yes, Verbal Permission Granted     Permission granted to share info w AGENCY: [place]        Emotional Assessment   Attitude/Demeanor/Rapport: Unable to Assess Affect (typically observed): Unable to Assess Orientation: : Oriented to Self Alcohol / Substance Use: Not Applicable Psych Involvement: No (comment)  Admission diagnosis:  Encephalopathy [G93.40] Fall, initial encounter [W19.XXXA] COVID-19 virus infection [U07.1] COVID-19 [U07.1] Patient Active Problem List   Diagnosis Date Noted   COVID-19 virus infection 12/09/2022   TIA (transient ischemic attack) 02/04/2020   Esophagitis    AAA (abdominal aortic aneurysm) ([place]) 04/19/2019   Gastrointestinal hemorrhage    NSTEMI (non-ST elevated myocardial infarction) ([place]) 09/21/2018   Unstable angina ([place]) 09/19/2018   Ascending aortic aneurysm ([place]) 09/19/2018   Chronic diastolic (congestive) heart failure ([place]) 09/19/2018   Coronary artery disease involving native heart without angina pectoris 09/19/2018   CVA (cerebral vascular accident) ([place]) 04/22/2016   PAF (paroxysmal atrial fibrillation) ([place]); CHA2DS2-VASc Score = 7.  Now on DOAC -after recurrent TIA (2021) 04/22/2016   Mild aortic stenosis by prior echocardiogram 04/22/2016  Recurrent pleural effusion on left 04/17/2016   Recurrent left pleural effusion 04/09/2016   Lung nodule, solitary 04/09/2016   Status post CVA 04/09/2016   Hyperlipidemia 04/09/2016   History of breast cancer 04/09/2016   PCP:  Carolee Rota, NP Pharmacy:  No Pharmacies Listed    Social Determinants  of Health (SDOH) Social History: SDOH Screenings   Food Insecurity: No Food Insecurity (09/19/2018)  Transportation Needs: No Transportation Needs (09/19/2018)  Financial Resource Strain: Low Risk  (09/19/2018)  Physical Activity: Sufficiently Active (09/19/2018)  Social Connections: Somewhat Isolated (09/19/2018)  Stress: No Stress Concern Present (09/19/2018)  Tobacco Use: Medium Risk (12/08/2022)   SDOH Interventions:     Readmission Risk Interventions    12/10/2022   11:30 AM  Readmission Risk Prevention Plan  Transportation Screening Complete  PCP or Specialist Appt within 5-7 Days Complete  Home Care Screening Complete  Medication Review (RN CM) Complete

## 2022-12-10 NOTE — NC FL2 (Signed)
[place] MEDICAID FL2 LEVEL OF CARE FORM     IDENTIFICATION  Patient Name: Leslie Carr Birthdate: 05-02-1931 Sex: female Admission Date (Current Location): 12/08/2022  [facility] and [place] Number:  Herbalist and Address:  [facility],  [place] [address] [place]      Provider Number: [id number]  Attending Physician Name and Address:  Debbe Odea, MD  Relative Name and Phone Number:  Derek Jack Kathlyn [place] [phone]    Current Level of Care: Hospital Recommended Level of Care: [place] Prior Approval Number:    Date Approved/Denied:   PASRR Number:    Discharge Plan: Other (Comment) (ALF)    Current Diagnoses: Patient Active Problem List   Diagnosis Date Noted   COVID-19 virus infection 12/09/2022   TIA (transient ischemic attack) 02/04/2020   Esophagitis    AAA (abdominal aortic aneurysm) ([place]) 04/19/2019   Gastrointestinal hemorrhage    NSTEMI (non-ST elevated myocardial infarction) ([place]) 09/21/2018   Unstable angina ([place]) 09/19/2018   Ascending aortic aneurysm ([place]) 09/19/2018   Chronic diastolic (congestive) heart failure ([place]) 09/19/2018   Coronary artery disease involving native heart without angina pectoris 09/19/2018   CVA (cerebral vascular accident) ([place]) 04/22/2016   PAF (paroxysmal atrial fibrillation) ([place]); CHA2DS2-VASc Score = 7.  Now on DOAC -after recurrent TIA (2021) 04/22/2016   Mild aortic stenosis by prior echocardiogram 04/22/2016   Recurrent pleural effusion on left 04/17/2016   Recurrent left pleural effusion 04/09/2016   Lung nodule, solitary 04/09/2016   Status post CVA 04/09/2016   Hyperlipidemia 04/09/2016   History of breast cancer 04/09/2016    Orientation RESPIRATION BLADDER Height & Weight     Self  Normal Incontinent, External catheter Weight:   Height:     BEHAVIORAL SYMPTOMS/MOOD NEUROLOGICAL BOWEL NUTRITION STATUS      Incontinent Diet (Regular)  AMBULATORY STATUS  COMMUNICATION OF NEEDS Skin   Limited Assist Verbally Normal                       Personal Care Assistance Level of Assistance  Bathing, Feeding, Dressing Bathing Assistance: Maximum assistance Feeding assistance: Limited assistance Dressing Assistance: Maximum assistance     Functional Limitations Info  Sight, Hearing, Speech Sight Info: Adequate Hearing Info: Adequate Speech Info: Adequate    SPECIAL CARE FACTORS FREQUENCY  PT (By licensed PT), OT (By licensed OT)     PT Frequency: 2-3x/wk OT Frequency: 2-3x/wk            Contractures Contractures Info: Not present    Additional Factors Info  Code Status, Allergies, Psychotropic Code Status Info: FULL Allergies Info: Lipitor (Atorvastatin) Psychotropic Info: See MAR         Current Medications (12/10/2022):  This is the current hospital active medication list Current Facility-Administered Medications  Medication Dose Route Frequency Provider Last Rate Last Admin   acetaminophen (TYLENOL) tablet 500 mg  500 mg Oral Q6H PRN Kayleen Memos, DO       apixaban (ELIQUIS) tablet 5 mg  5 mg Oral BID Irene Pap N, DO   5 mg at 12/10/22 4010   ascorbic acid (VITAMIN C) tablet 500 mg  500 mg Oral Daily Irene Pap N, DO   500 mg at 12/10/22 2725   docusate sodium (COLACE) capsule 100 mg  100 mg Oral QPM Hall, Carole N, DO       guaiFENesin-dextromethorphan (ROBITUSSIN DM) 100-10 MG/5ML syrup 10 mL  10 mL Oral Q4H PRN  Irene Pap N, DO       haloperidol lactate (HALDOL) injection 1 mg  1 mg Intravenous Q6H PRN Donne Hazel, MD   1 mg at 12/10/22 2449   lactated ringers infusion   Intravenous Continuous Donne Hazel, MD 75 mL/hr at 12/10/22 1001 New Bag at 12/10/22 1001   molnupiravir EUA (LAGEVRIO) capsule 800 mg  4 capsule Oral BID Godfrey Pick, MD   800 mg at 12/10/22 0957   multivitamin with minerals tablet 1 tablet  1 tablet Oral Daily Irene Pap N, DO   1 tablet at 12/10/22 1000   pravastatin (PRAVACHOL)  tablet 40 mg  40 mg Oral Daily Kayleen Memos, DO   40 mg at 12/10/22 7530   zinc sulfate capsule 220 mg  220 mg Oral Daily Kayleen Memos, DO   220 mg at 12/10/22 0511     Discharge Medications: Please see discharge summary for a list of discharge medications.  Relevant Imaging Results:  Relevant Lab Results:   Additional Information SSN: [id number]  Vassie Moselle, LCSW

## 2022-12-10 NOTE — Progress Notes (Signed)
[facility] Progress Note  Patient: Leslie Carr     GQQ:[id number]  DOA: 12/08/2022   PCP: Carolee Rota, NP       Brief hospital course: This is an 88 year old female with dementia, CKD 3B, paroxysmal atrial fibrillation, thoracic and abdominal aortic aneurysms who presented from her assisted living facility for fever and positive COVID. In the ED, COVID PCR was positive The patient was started on molnupiravir and given IV fluids.  Subjective:  Unable to communicate with me other than tell her name. Assessment and Plan: Principal Problem:   COVID-19 virus infection with acute metabolic encephalopathy-history of dementia -Continue molnupiravir -Follow oral intake-when she is eating, she can be discharged  Active Problems:   Status post CVA   PAF (paroxysmal atrial fibrillation)  -Continue apixaban    Ascending aortic aneurysm and abdominal aortic aneurysm - Follow as outpatient    Dementia without behavioral disturbance ([place]) -Oriented only to person     Code Status: Full Code Consultants: None Level of Care: Level of care: Telemetry Total time on patient care: 35 minutes DVT prophylaxis: Apixaban  Objective:   Vitals:   12/09/22 1821 12/09/22 2023 12/09/22 2314 12/10/22 0532  BP: (!) 139/109 (!) 86/59 (!) 171/136 113/62  Pulse: (!) 106 80 83   Resp: '20 20 18   '$ Temp: (!) 100.6 F (38.1 C) 97.8 F (36.6 C) 97.6 F (36.4 C) 99.5 F (37.5 C)  TempSrc: Oral Axillary Oral Oral  SpO2: 94% 93% 93% 93%   There were no vitals filed for this visit. Exam: General exam: Appears comfortable  HEENT: oral mucosa moist Respiratory system: Clear to auscultation.  Cardiovascular system: S1 & S2 heard  Gastrointestinal system: Abdomen soft, non-tender, nondistended. Normal bowel sounds   Extremities: No cyanosis, clubbing or edema Psychiatry: Flat affect-oriented only to person    CBC: Recent Labs  Lab 12/08/22 1950 12/08/22 2107 12/09/22 0516  WBC  7.6  --  5.8  NEUTROABS 6.8  --   --   HGB 10.4* 9.5* 9.2*  HCT 34.3* 28.0* 30.0*  MCV 92.5  --  91.2  PLT 299  --  671   Basic Metabolic Panel: Recent Labs  Lab 12/08/22 1950 12/08/22 2107 12/09/22 0516  NA 139 139 136  K 4.0 4.3 3.4*  CL 106 104 107  CO2 24  --  22  GLUCOSE 117* 110* 90  BUN '16 18 15  '$ CREATININE 1.48* 1.40* 1.17*  CALCIUM 8.8*  --  7.5*  MG 1.9  --  1.8  PHOS  --   --  3.7   GFR: CrCl cannot be calculated (Unknown ideal weight.).  Scheduled Meds:  apixaban  5 mg Oral BID   vitamin C  500 mg Oral Daily   docusate sodium  100 mg Oral QPM   molnupiravir EUA  4 capsule Oral BID   multivitamin with minerals  1 tablet Oral Daily   pravastatin  40 mg Oral Daily   zinc sulfate  220 mg Oral Daily   Continuous Infusions:  lactated ringers 75 mL/hr at 12/10/22 1001   Imaging and lab data was personally reviewed CT CHEST ABDOMEN PELVIS WO CONTRAST  Result Date: 12/09/2022 CLINICAL DATA:  Sepsis. COVID positive. Cough, nausea, vomiting, and fever. Possible fall. EXAM: CT CHEST, ABDOMEN AND PELVIS WITHOUT CONTRAST TECHNIQUE: Multidetector CT imaging of the chest, abdomen and pelvis was performed following the standard protocol without IV contrast. RADIATION DOSE REDUCTION: This exam was performed according to the departmental dose-optimization  program which includes automated exposure control, adjustment of the mA and/or kV according to patient size and/or use of iterative reconstruction technique. COMPARISON:  10/31/2022. FINDINGS: CT CHEST FINDINGS Cardiovascular: The heart is normal in size and there is no pericardial effusion. Multi-vessel coronary artery calcifications are noted. There is atherosclerotic calcification of the aorta with aneurysmal dilatation of the ascending aorta measuring 4.4 cm. 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 The pulmonary trunk is normal in caliber. Mediastinum/Nodes: No  mediastinal or axillary lymphadenopathy. Evaluation of the hilar regions is limited due to lack of IV contrast. A coarse calcification is noted in the left lobe of the thyroid gland. The trachea and esophagus are within normal limits. Small hiatal hernia. Lungs/Pleura: There is a trace left pleural effusion with atelectasis at the lung bases. 1 cm nodule is noted in the apical segment of the right upper lobe, not significantly changed from the prior exam, axial image 29. There is a stable 7 mm right lower lobe pulmonary, axial image 110. There is a partially calcified mass in the posterior aspect of the right middle lobe measuring 3.7 x 2.2, axial image 82, not significantly changed from the prior exam. Stable fibrotic changes with volume loss are noted in the left upper lobe. No pneumothorax. Musculoskeletal: Degenerative changes in the thoracic and cervical spine and left glenohumeral joint. No acute or suspicious osseous abnormality. Left mastectomy changes are noted. CT ABDOMEN PELVIS FINDINGS Hepatobiliary: No focal liver abnormality is seen. No gallstones, gallbladder wall thickening, or biliary dilatation. Pancreas: Unremarkable. No pancreatic ductal dilatation or surrounding inflammatory changes. Spleen: Normal in size without focal abnormality. Adrenals/Urinary Tract: The adrenal glands are within normal limits. No renal calculus or hydronephrosis. The bladder is unremarkable. Stomach/Bowel: Small hiatal hernia. Stomach is within normal limits. Appendix is not seen. No evidence of bowel wall thickening, distention, or inflammatory changes. No free air or pneumatosis. Scattered diverticula are present along the colon without evidence of diverticulitis. Vascular/Lymphatic: Aortic atherosclerosis with aneurysmal dilatation of the infrarenal abdominal aorta measuring 4.0 x 4.0 cm. No abdominal or pelvic lymphadenopathy. Reproductive: Status post hysterectomy. No adnexal masses. Other: No abdominopelvic ascites.  Musculoskeletal: Degenerative changes in the lumbar spine. No acute osseous abnormality. IMPRESSION: 1. Trace left pleural effusion with atelectasis in the lungs bilaterally. 2. Right upper and lower lobe pulmonary nodules and right middle lobe partially calcified pulmonary mass are unchanged. 3. Aortic atherosclerosis with aneurysmal dilatation of the ascending aorta measuring 4.4 cm. Recommend annual imaging followup by CTA or MRA. This recommendation follows 2010 ACCF/AHA/AATS/ACR/ASA/SCA/SCAI/SIR/STS/SVM Guidelines for the Diagnosis and Management of Patients with Thoracic Aortic Disease. Circulation. 2010; 121: X324-M010. Aortic aneurysm NOS (ICD10-I71.9) 4. Aortic atherosclerosis with stable infrarenal abdominal aortic aneurysm measuring 4 cm. Recommend follow-up every 12 months and vascular consultation. This recommendation follows ACR consensus guidelines: White Paper of the ACR Incidental Findings Committee II on Vascular Findings. J Am Coll Radiol 2013; 10:789-794. 5. No acute process in the abdomen and pelvis. 6. Remaining incidental findings are unchanged. Electronically Signed   By: Brett Fairy M.D.   On: 12/09/2022 01:03   CT HEAD WO CONTRAST  Result Date: 12/09/2022 CLINICAL DATA:  Head trauma, moderate-severe; Polytrauma, blunt EXAM: CT HEAD WITHOUT CONTRAST CT CERVICAL SPINE WITHOUT CONTRAST TECHNIQUE: Multidetector CT imaging of the head and cervical spine was performed following the standard protocol without intravenous contrast. Multiplanar CT image reconstructions of the cervical spine were also generated. RADIATION DOSE REDUCTION: This exam was performed  according to the departmental dose-optimization program which includes automated exposure control, adjustment of the mA and/or kV according to patient size and/or use of iterative reconstruction technique. COMPARISON:  CT angiography chest '11 10 23 '$ FINDINGS: CT HEAD FINDINGS Brain: Cerebral ventricle sizes are concordant with the degree  of cerebral volume loss. Patchy and confluent areas of decreased attenuation are noted throughout the deep and periventricular white matter of the cerebral hemispheres bilaterally, compatible with chronic microvascular ischemic disease. No evidence of large-territorial acute infarction. No parenchymal hemorrhage. No mass lesion. No extra-axial collection. No mass effect or midline shift. No hydrocephalus. Basilar cisterns are patent. Vascular: No hyperdense vessel. Atherosclerotic calcifications are present within the cavernous internal carotid arteries. Skull: No acute fracture or focal lesion. Sinuses/Orbits: Paranasal sinuses and mastoid air cells are clear. The orbits are unremarkable. Other: None. CT CERVICAL SPINE FINDINGS Alignment: Grade 1 anterolisthesis of C3 on C4 and C7 on T1. Skull base and vertebrae: Multilevel moderate degenerative changes spine most prominent at the C4 through C6 levels. Associated moderate to severe osseous neural foraminal stenosis at the C4-C5 level. No acute fracture. No aggressive appearing focal osseous lesion or focal pathologic process. Soft tissues and spinal canal: No prevertebral fluid or swelling. No visible canal hematoma. Upper chest: Unremarkable. Other: Severe atherosclerotic plaque of the visualized aortic arch. Atherosclerotic plaque of the carotid arteries within the neck. 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 1.3 cm calcified left thyroid gland nodule. In the setting of significant comorbidities or limited life expectancy, no follow-up recommended (ref: J Am Coll Radiol. 2015 Feb;12(2): 143-50). IMPRESSION: 1. No acute intracranial abnormality. 2. No acute displaced fracture or traumatic listhesis of the cervical spine. 3. Aortic Atherosclerosis (ICD10-I70.0). 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 Electronically Signed   By: Iven Finn  M.D.   On: 12/09/2022 00:45   CT CERVICAL SPINE WO CONTRAST  Result Date: 12/09/2022 CLINICAL DATA:  Head trauma, moderate-severe; Polytrauma, blunt EXAM: CT HEAD WITHOUT CONTRAST CT CERVICAL SPINE WITHOUT CONTRAST TECHNIQUE: Multidetector CT imaging of the head and cervical spine was performed following the standard protocol without intravenous contrast. Multiplanar CT image reconstructions of the cervical spine were also generated. RADIATION DOSE REDUCTION: This exam was performed according to the departmental dose-optimization program which includes automated exposure control, adjustment of the mA and/or kV according to patient size and/or use of iterative reconstruction technique. COMPARISON:  CT angiography chest '11 10 23 '$ FINDINGS: CT HEAD FINDINGS Brain: Cerebral ventricle sizes are concordant with the degree of cerebral volume loss. Patchy and confluent areas of decreased attenuation are noted throughout the deep and periventricular white matter of the cerebral hemispheres bilaterally, compatible with chronic microvascular ischemic disease. No evidence of large-territorial acute infarction. No parenchymal hemorrhage. No mass lesion. No extra-axial collection. No mass effect or midline shift. No hydrocephalus. Basilar cisterns are patent. Vascular: No hyperdense vessel. Atherosclerotic calcifications are present within the cavernous internal carotid arteries. Skull: No acute fracture or focal lesion. Sinuses/Orbits: Paranasal sinuses and mastoid air cells are clear. The orbits are unremarkable. Other: None. CT CERVICAL SPINE FINDINGS Alignment: Grade 1 anterolisthesis of C3 on C4 and C7 on T1. Skull base and vertebrae: Multilevel moderate degenerative changes spine most prominent at the C4 through C6 levels. Associated moderate to severe osseous neural foraminal stenosis at the C4-C5 level. No acute fracture. No aggressive appearing focal osseous lesion or focal pathologic process. Soft tissues and spinal  canal: No prevertebral fluid  or swelling. No visible canal hematoma. Upper chest: Unremarkable. Other: Severe atherosclerotic plaque of the visualized aortic arch. Atherosclerotic plaque of the carotid arteries within the neck. 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 1.3 cm calcified left thyroid gland nodule. In the setting of significant comorbidities or limited life expectancy, no follow-up recommended (ref: J Am Coll Radiol. 2015 Feb;12(2): 143-50). IMPRESSION: 1. No acute intracranial abnormality. 2. No acute displaced fracture or traumatic listhesis of the cervical spine. 3. Aortic Atherosclerosis (ICD10-I70.0). 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 Electronically Signed   By: Iven Finn M.D.   On: 12/09/2022 00:45   DG Chest Port 1 View  Result Date: 12/08/2022 CLINICAL DATA:  Trauma EXAM: PORTABLE CHEST 1 VIEW COMPARISON:  10/31/2022. FINDINGS: Cardiac silhouette is enlarged. Aorta is ectatic tortuous and calcified. Right mid to lower lung 4.3 cm masslike area noted to be a stable finding. Left lung clear. Normal pulmonary vasculature. IMPRESSION: Right lung mass versus consolidation.  Enlarged cardiac silhouette. Electronically Signed   By: Sammie Bench M.D.   On: 12/08/2022 20:02   DG Pelvis Portable  Result Date: 12/08/2022 CLINICAL DATA:  Trauma fall EXAM: PORTABLE PELVIS 1-2 VIEWS COMPARISON:  None Available. FINDINGS: There is no evidence of displaced pelvic fracture or diastasis. No pelvic bone lesions are seen. Nonobstructive pattern of overlying bowel gas. IMPRESSION: No displaced pelvic fracture or diastasis. Please note that plain radiographs are significantly insensitive for hip and pelvic fracture. Recommend CT or MRI to more sensitively evaluate if there is high clinical suspicion for fracture. Electronically Signed   By: Delanna Ahmadi M.D.   On: 12/08/2022  20:02    LOS: 1 day   Author: Debbe Odea  12/10/2022 12:06 PM  To contact [facility]>   Check the care team in [facility] and look for the attending/consulting [place] provider listed  Log into [email or web address] and use [place]'s universal password   Go to> "[facility]"  and find provider  If you still have difficulty reaching the provider, please page the [facility] (Director on Call) for the Hospitalists listed on amion

## 2022-12-11 DIAGNOSIS — G9340 Encephalopathy, unspecified: Secondary | ICD-10-CM | POA: Diagnosis not present

## 2022-12-11 DIAGNOSIS — U071 COVID-19: Secondary | ICD-10-CM | POA: Diagnosis not present

## 2022-12-11 MED ORDER — MOLNUPIRAVIR EUA 200MG CAPSULE: 4.0000 | ORAL_CAPSULE | Freq: Two times a day (BID) | ORAL | 0 refills | Status: AC

## 2022-12-11 MED ORDER — ADULT MULTIVITAMIN W/MINERALS CH: 1.0000 | ORAL_TABLET | Freq: Every day | ORAL | Status: AC

## 2022-12-11 NOTE — Progress Notes (Signed)
   12/10/22 2023  Assess: MEWS Score  Temp 99 F (37.2 C)  BP (!) 96/56  Pulse Rate 65  Assess: MEWS Score  MEWS Temp 0  MEWS Systolic 1  MEWS Pulse 0  MEWS RR 2  MEWS LOC 0  MEWS Score 3  MEWS Score Color Yellow  Assess: if the MEWS score is Yellow or Red  Were vital signs taken at a resting state? Yes  Focused Assessment Change from prior assessment (see assessment flowsheet)  Does the patient meet 2 or more of the SIRS criteria? Yes  Does the patient have a confirmed or suspected source of infection? Yes  Provider and Rapid Response Notified? Yes  MEWS guidelines implemented *See Row Information* Yes  Treat  MEWS Interventions Administered prn meds/treatments  Take Vital Signs  Increase Vital Sign Frequency  Yellow: Q 2hr X 2 then Q 4hr X 2, if remains yellow, continue Q 4hrs  Escalate  MEWS: Escalate Yellow: discuss with charge nurse/RN and consider discussing with provider and RRT  Notify: Charge Nurse/RN  Name of Charge Nurse/RN Notified T Libbey Duce  Date Charge Nurse/RN Notified 12/10/22  Time Charge Nurse/RN Notified 2023  Provider Notification  Provider Name/Title [place]  Date Provider Notified 12/10/22  Time Provider Notified 2025  Method of Notification Page  Notification Reason Change in status  Provider response No new orders  Date of Provider Response 12/10/22  Time of Provider Response 2140  Document  Patient Outcome Stabilized after interventions  Progress note created (see row info) Yes  Assess: SIRS CRITERIA  SIRS Temperature  0  SIRS Pulse 0  SIRS Respirations  1  SIRS WBC 0  SIRS Score Sum  1

## 2022-12-11 NOTE — Discharge Summary (Addendum)
Physician Discharge Summary  Leslie Carr:[id number] DOB: Jun 17, 1931 DOA: 12/08/2022  PCP: Carolee Rota, NP  Admit date: 12/08/2022 Discharge date: 12/11/2022 Discharging to: ALF     Discharge Diagnoses:   Principal Problem:   COVID-19 virus infection Active Problems:   Acute metabolic encephalopathy   Status post CVA   PAF (paroxysmal atrial fibrillation) ([place]); CHA2DS2-VASc Score = 7.  Now on DOAC -after recurrent TIA (2021)   Ascending aortic aneurysm (HCC)   Dementia without behavioral disturbance [facility])     Hospital Course:  This is an 88 year old female with dementia, CKD 3B, paroxysmal atrial fibrillation, thoracic and abdominal aortic aneurysms who presented from her assisted living facility for fever and positive COVID. In the ED, COVID PCR was positive The patient was started on molnupiravir and given IV fluids.     Assessment and Plan: Principal Problem:   COVID-19 virus infection with acute metabolic encephalopathy-history of dementia - Continue molnupiravir x 5 days - no major respiratory symptoms- she mainly complained of weakness and poor appetitie - mental status has returned to baseline - I have discussed the plan to dc back to ALF with her daughter as well today   Active Problems:   Status post CVA   PAF (paroxysmal atrial fibrillation)  -Continue apixaban  AKI - Cr 1.48- has improved to 1.17 with IVF     Ascending aortic aneurysm and abdominal aortic aneurysm - Follow as outpatient     Dementia without behavioral disturbance ([place]) -Oriented only to person      Discharge Instructions  Discharge Instructions     Diet - low sodium heart healthy   Complete by: As directed    Increase activity slowly   Complete by: As directed       Allergies as of 12/11/2022       Reactions   Lipitor [atorvastatin] Other (See Comments)   Dizzy and make her head feel huge         Medication List     TAKE these medications     acetaminophen 325 MG tablet Commonly known as: TYLENOL Take 650 mg by mouth every 6 (six) hours as needed for mild pain or fever. For pain when needed.   apixaban 5 MG Tabs tablet Commonly known as: ELIQUIS Take 1 tablet (5 mg total) by mouth 2 (two) times daily.   aspirin EC 81 MG tablet Take 1 tablet (81 mg total) by mouth daily.   docusate sodium 100 MG capsule Commonly known as: COLACE Take 100 mg by mouth daily as needed for mild constipation.   mirtazapine 7.5 MG tablet Commonly known as: REMERON Take 7.5 mg by mouth at bedtime.   molnupiravir EUA 200 mg Caps capsule Commonly known as: LAGEVRIO Take 4 capsules (800 mg total) by mouth 2 (two) times daily for 5 days.   multivitamin with minerals Tabs tablet Take 1 tablet by mouth daily. Start taking on: December 12, 2022   pravastatin 40 MG tablet Commonly known as: PRAVACHOL Take 40 mg by mouth daily.            The results of significant diagnostics from this hospitalization (including imaging, microbiology, ancillary and laboratory) are listed below for reference.    CT CHEST ABDOMEN PELVIS WO CONTRAST  Result Date: 12/09/2022 CLINICAL DATA:  Sepsis. COVID positive. Cough, nausea, vomiting, and fever. Possible fall. EXAM: CT CHEST, ABDOMEN AND PELVIS WITHOUT CONTRAST TECHNIQUE: Multidetector CT imaging of the chest, abdomen and pelvis was performed following the standard protocol without  IV contrast. RADIATION DOSE REDUCTION: This exam was performed according to the departmental dose-optimization program which includes automated exposure control, adjustment of the mA and/or kV according to patient size and/or use of iterative reconstruction technique. COMPARISON:  10/31/2022. FINDINGS: CT CHEST FINDINGS Cardiovascular: The heart is normal in size and there is no pericardial effusion. Multi-vessel coronary artery calcifications are noted. There is atherosclerotic calcification of the aorta with aneurysmal  dilatation of the ascending aorta measuring 4.4 cm. 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 The pulmonary trunk is normal in caliber. Mediastinum/Nodes: No mediastinal or axillary lymphadenopathy. Evaluation of the hilar regions is limited due to lack of IV contrast. A coarse calcification is noted in the left lobe of the thyroid gland. The trachea and esophagus are within normal limits. Small hiatal hernia. Lungs/Pleura: There is a trace left pleural effusion with atelectasis at the lung bases. 1 cm nodule is noted in the apical segment of the right upper lobe, not significantly changed from the prior exam, axial image 29. There is a stable 7 mm right lower lobe pulmonary, axial image 110. There is a partially calcified mass in the posterior aspect of the right middle lobe measuring 3.7 x 2.2, axial image 82, not significantly changed from the prior exam. Stable fibrotic changes with volume loss are noted in the left upper lobe. No pneumothorax. Musculoskeletal: Degenerative changes in the thoracic and cervical spine and left glenohumeral joint. No acute or suspicious osseous abnormality. Left mastectomy changes are noted. CT ABDOMEN PELVIS FINDINGS Hepatobiliary: No focal liver abnormality is seen. No gallstones, gallbladder wall thickening, or biliary dilatation. Pancreas: Unremarkable. No pancreatic ductal dilatation or surrounding inflammatory changes. Spleen: Normal in size without focal abnormality. Adrenals/Urinary Tract: The adrenal glands are within normal limits. No renal calculus or hydronephrosis. The bladder is unremarkable. Stomach/Bowel: Small hiatal hernia. Stomach is within normal limits. Appendix is not seen. No evidence of bowel wall thickening, distention, or inflammatory changes. No free air or pneumatosis. Scattered diverticula are present along the colon without evidence of diverticulitis. Vascular/Lymphatic: Aortic atherosclerosis with  aneurysmal dilatation of the infrarenal abdominal aorta measuring 4.0 x 4.0 cm. No abdominal or pelvic lymphadenopathy. Reproductive: Status post hysterectomy. No adnexal masses. Other: No abdominopelvic ascites. Musculoskeletal: Degenerative changes in the lumbar spine. No acute osseous abnormality. IMPRESSION: 1. Trace left pleural effusion with atelectasis in the lungs bilaterally. 2. Right upper and lower lobe pulmonary nodules and right middle lobe partially calcified pulmonary mass are unchanged. 3. Aortic atherosclerosis with aneurysmal dilatation of the ascending aorta measuring 4.4 cm. Recommend annual imaging followup by CTA or MRA. This recommendation follows 2010 ACCF/AHA/AATS/ACR/ASA/SCA/SCAI/SIR/STS/SVM Guidelines for the Diagnosis and Management of Patients with Thoracic Aortic Disease. Circulation. 2010; 121: R916-B846. Aortic aneurysm NOS (ICD10-I71.9) 4. Aortic atherosclerosis with stable infrarenal abdominal aortic aneurysm measuring 4 cm. Recommend follow-up every 12 months and vascular consultation. This recommendation follows ACR consensus guidelines: White Paper of the ACR Incidental Findings Committee II on Vascular Findings. J Am Coll Radiol 2013; 10:789-794. 5. No acute process in the abdomen and pelvis. 6. Remaining incidental findings are unchanged. Electronically Signed   By: Brett Fairy M.D.   On: 12/09/2022 01:03   CT HEAD WO CONTRAST  Result Date: 12/09/2022 CLINICAL DATA:  Head trauma, moderate-severe; Polytrauma, blunt EXAM: CT HEAD WITHOUT CONTRAST CT CERVICAL SPINE WITHOUT CONTRAST TECHNIQUE: Multidetector CT imaging of the head and cervical spine was performed following the standard protocol without intravenous contrast. Multiplanar CT image reconstructions  of the cervical spine were also generated. RADIATION DOSE REDUCTION: This exam was performed according to the departmental dose-optimization program which includes automated exposure control, adjustment of the mA and/or  kV according to patient size and/or use of iterative reconstruction technique. COMPARISON:  CT angiography chest '11 10 23 '$ FINDINGS: CT HEAD FINDINGS Brain: Cerebral ventricle sizes are concordant with the degree of cerebral volume loss. Patchy and confluent areas of decreased attenuation are noted throughout the deep and periventricular white matter of the cerebral hemispheres bilaterally, compatible with chronic microvascular ischemic disease. No evidence of large-territorial acute infarction. No parenchymal hemorrhage. No mass lesion. No extra-axial collection. No mass effect or midline shift. No hydrocephalus. Basilar cisterns are patent. Vascular: No hyperdense vessel. Atherosclerotic calcifications are present within the cavernous internal carotid arteries. Skull: No acute fracture or focal lesion. Sinuses/Orbits: Paranasal sinuses and mastoid air cells are clear. The orbits are unremarkable. Other: None. CT CERVICAL SPINE FINDINGS Alignment: Grade 1 anterolisthesis of C3 on C4 and C7 on T1. Skull base and vertebrae: Multilevel moderate degenerative changes spine most prominent at the C4 through C6 levels. Associated moderate to severe osseous neural foraminal stenosis at the C4-C5 level. No acute fracture. No aggressive appearing focal osseous lesion or focal pathologic process. Soft tissues and spinal canal: No prevertebral fluid or swelling. No visible canal hematoma. Upper chest: Unremarkable. Other: Severe atherosclerotic plaque of the visualized aortic arch. Atherosclerotic plaque of the carotid arteries within the neck. 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 1.3 cm calcified left thyroid gland nodule. In the setting of significant comorbidities or limited life expectancy, no follow-up recommended (ref: J Am Coll Radiol. 2015 Feb;12(2): 143-50). IMPRESSION: 1. No acute intracranial abnormality. 2. No acute displaced fracture or traumatic  listhesis of the cervical spine. 3. Aortic Atherosclerosis (ICD10-I70.0). 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 Electronically Signed   By: Iven Finn M.D.   On: 12/09/2022 00:45   CT CERVICAL SPINE WO CONTRAST  Result Date: 12/09/2022 CLINICAL DATA:  Head trauma, moderate-severe; Polytrauma, blunt EXAM: CT HEAD WITHOUT CONTRAST CT CERVICAL SPINE WITHOUT CONTRAST TECHNIQUE: Multidetector CT imaging of the head and cervical spine was performed following the standard protocol without intravenous contrast. Multiplanar CT image reconstructions of the cervical spine were also generated. RADIATION DOSE REDUCTION: This exam was performed according to the departmental dose-optimization program which includes automated exposure control, adjustment of the mA and/or kV according to patient size and/or use of iterative reconstruction technique. COMPARISON:  CT angiography chest '11 10 23 '$ FINDINGS: CT HEAD FINDINGS Brain: Cerebral ventricle sizes are concordant with the degree of cerebral volume loss. Patchy and confluent areas of decreased attenuation are noted throughout the deep and periventricular white matter of the cerebral hemispheres bilaterally, compatible with chronic microvascular ischemic disease. No evidence of large-territorial acute infarction. No parenchymal hemorrhage. No mass lesion. No extra-axial collection. No mass effect or midline shift. No hydrocephalus. Basilar cisterns are patent. Vascular: No hyperdense vessel. Atherosclerotic calcifications are present within the cavernous internal carotid arteries. Skull: No acute fracture or focal lesion. Sinuses/Orbits: Paranasal sinuses and mastoid air cells are clear. The orbits are unremarkable. Other: None. CT CERVICAL SPINE FINDINGS Alignment: Grade 1 anterolisthesis of C3 on C4 and C7 on T1. Skull base and vertebrae: Multilevel moderate degenerative changes spine most prominent at the C4  through C6 levels. Associated moderate to severe osseous neural foraminal stenosis at the C4-C5 level. No acute fracture. No aggressive appearing focal  osseous lesion or focal pathologic process. Soft tissues and spinal canal: No prevertebral fluid or swelling. No visible canal hematoma. Upper chest: Unremarkable. Other: Severe atherosclerotic plaque of the visualized aortic arch. Atherosclerotic plaque of the carotid arteries within the neck. 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 1.3 cm calcified left thyroid gland nodule. In the setting of significant comorbidities or limited life expectancy, no follow-up recommended (ref: J Am Coll Radiol. 2015 Feb;12(2): 143-50). IMPRESSION: 1. No acute intracranial abnormality. 2. No acute displaced fracture or traumatic listhesis of the cervical spine. 3. Aortic Atherosclerosis (ICD10-I70.0). 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 Electronically Signed   By: Iven Finn M.D.   On: 12/09/2022 00:45   DG Chest Port 1 View  Result Date: 12/08/2022 CLINICAL DATA:  Trauma EXAM: PORTABLE CHEST 1 VIEW COMPARISON:  10/31/2022. FINDINGS: Cardiac silhouette is enlarged. Aorta is ectatic tortuous and calcified. Right mid to lower lung 4.3 cm masslike area noted to be a stable finding. Left lung clear. Normal pulmonary vasculature. IMPRESSION: Right lung mass versus consolidation.  Enlarged cardiac silhouette. Electronically Signed   By: Sammie Bench M.D.   On: 12/08/2022 20:02   DG Pelvis Portable  Result Date: 12/08/2022 CLINICAL DATA:  Trauma fall EXAM: PORTABLE PELVIS 1-2 VIEWS COMPARISON:  None Available. FINDINGS: There is no evidence of displaced pelvic fracture or diastasis. No pelvic bone lesions are seen. Nonobstructive pattern of overlying bowel gas. IMPRESSION: No displaced pelvic fracture or diastasis. Please note that plain radiographs  are significantly insensitive for hip and pelvic fracture. Recommend CT or MRI to more sensitively evaluate if there is high clinical suspicion for fracture. Electronically Signed   By: Delanna Ahmadi M.D.   On: 12/08/2022 20:02   Labs:   Basic Metabolic Panel: Recent Labs  Lab 12/08/22 1950 12/08/22 2107 12/09/22 0516  NA 139 139 136  K 4.0 4.3 3.4*  CL 106 104 107  CO2 24  --  22  GLUCOSE 117* 110* 90  BUN '16 18 15  '$ CREATININE 1.48* 1.40* 1.17*  CALCIUM 8.8*  --  7.5*  MG 1.9  --  1.8  PHOS  --   --  3.7     CBC: Recent Labs  Lab 12/08/22 1950 12/08/22 2107 12/09/22 0516  WBC 7.6  --  5.8  NEUTROABS 6.8  --   --   HGB 10.4* 9.5* 9.2*  HCT 34.3* 28.0* 30.0*  MCV 92.5  --  91.2  PLT 299  --  253         SIGNED:   Debbe Odea, MD  [facility] 12/11/2022, 1:46 PM

## 2022-12-11 NOTE — TOC Transition Note (Signed)
Transition of Care [facility]) - CM/SW Discharge Note   Patient Details  Name: Leslie Carr MRN: [id number] Date of Birth: 1931/04/26  [facility] of [facility]) CM/SW Contact:  Vassie Moselle, [place] Phone Number: 12/11/2022, 2:09 PM   Clinical Narrative:    Pt is to return to [facility] and will be receiving home health PT/OT with [facility]. RN to call report to [phone]. FL-2 and DC summary have been faxed to facility. HH orders have been faxed to [facility]. Pt's daughter aware and agreeable to discharge plans. Pt will be trasnported via [facility]. [facility] has been called for transportation at 12:13pm.   Final next level of care: Assisted Living (w/ home health) Barriers to Discharge: No Barriers Identified   Patient Goals and CMS Choice Patient states their goals for this hospitalization and ongoing recovery are:: For pt to return to [facility] Medicare.gov Compare Post Acute Care list provided to:: Patient Represenative (must comment) Choice offered to / list presented to : [facility] POA / Guardian, [place] ownership interest in [facility].provided to::  (NA)  Discharge Placement              Patient chooses bed at: Other - please specify in the comment section below: ([facility]\) Patient to be transferred to facility by: [place] Name of family member notified: Daughter, Kathlyn [place] Patient and family notified of of transfer: 12/11/22  Discharge Plan and Services In-house Referral: NA Discharge Planning Services: CM Consult Post Acute Care Choice: Home Health          DME Arranged: N/A DME Agency: NA       HH Arranged: PT, OT [place] Agency: Other - See comment Secondary school teacher) Date [place]: 12/10/22 Time [place]: [place] Representative spoke with at [place]: [place] ([place]) Interventions     Readmission Risk Interventions    12/11/2022    2:08 PM 12/10/2022   11:30 AM  Readmission Risk Prevention  Plan  Transportation Screening Complete Complete  PCP or Specialist Appt within 5-7 Days  Complete  Home Care Screening  Complete  Medication Review (RN CM)  Complete  HRI or Home Care Consult Complete   Social Work Consult for [place] Planning/Counseling Complete   Palliative Care Screening Not Applicable

## 2022-12-11 NOTE — Progress Notes (Signed)
Called report to [facility] RN at [facility]. Maria aware Corey Harold is here for transport.

## 2022-12-14 LAB — CULTURE, BLOOD (ROUTINE X 2)
Culture: NO GROWTH
Culture: NO GROWTH
Special Requests: ADEQUATE

## 2022-12-18 DIAGNOSIS — M6281 Muscle weakness (generalized): Secondary | ICD-10-CM | POA: Diagnosis not present

## 2022-12-18 DIAGNOSIS — R2689 Other abnormalities of gait and mobility: Secondary | ICD-10-CM | POA: Diagnosis not present

## 2022-12-18 DIAGNOSIS — R279 Unspecified lack of coordination: Secondary | ICD-10-CM | POA: Diagnosis not present

## 2022-12-22 DIAGNOSIS — R279 Unspecified lack of coordination: Secondary | ICD-10-CM | POA: Diagnosis not present

## 2022-12-22 DIAGNOSIS — M6281 Muscle weakness (generalized): Secondary | ICD-10-CM | POA: Diagnosis not present

## 2022-12-22 DIAGNOSIS — R2689 Other abnormalities of gait and mobility: Secondary | ICD-10-CM | POA: Diagnosis not present

## 2022-12-24 DIAGNOSIS — R279 Unspecified lack of coordination: Secondary | ICD-10-CM | POA: Diagnosis not present

## 2022-12-24 DIAGNOSIS — R2689 Other abnormalities of gait and mobility: Secondary | ICD-10-CM | POA: Diagnosis not present

## 2022-12-24 DIAGNOSIS — M6281 Muscle weakness (generalized): Secondary | ICD-10-CM | POA: Diagnosis not present

## 2022-12-25 ENCOUNTER — Emergency Department (HOSPITAL_COMMUNITY): Payer: Medicare Other

## 2022-12-25 ENCOUNTER — Emergency Department (HOSPITAL_COMMUNITY)
Admission: EM | Admit: 2022-12-25 | Discharge: 2022-12-26 | Disposition: A | Discharge status: Skilled Nursing Facility | Payer: Medicare Other | Attending: Emergency Medicine | Admitting: Emergency Medicine

## 2022-12-25 ENCOUNTER — Other Ambulatory Visit: Payer: Self-pay

## 2022-12-25 ENCOUNTER — Encounter (HOSPITAL_COMMUNITY): Payer: Self-pay

## 2022-12-25 DIAGNOSIS — R58 Hemorrhage, not elsewhere classified: Secondary | ICD-10-CM | POA: Diagnosis not present

## 2022-12-25 DIAGNOSIS — K610 Anal abscess: Secondary | ICD-10-CM

## 2022-12-25 DIAGNOSIS — K5730 Diverticulosis of large intestine without perforation or abscess without bleeding: Secondary | ICD-10-CM | POA: Diagnosis not present

## 2022-12-25 DIAGNOSIS — K649 Unspecified hemorrhoids: Secondary | ICD-10-CM | POA: Diagnosis not present

## 2022-12-25 DIAGNOSIS — F0390 Unspecified dementia without behavioral disturbance: Secondary | ICD-10-CM | POA: Diagnosis not present

## 2022-12-25 DIAGNOSIS — L03315 Cellulitis of perineum: Secondary | ICD-10-CM | POA: Insufficient documentation

## 2022-12-25 DIAGNOSIS — K625 Hemorrhage of anus and rectum: Secondary | ICD-10-CM | POA: Diagnosis present

## 2022-12-25 DIAGNOSIS — K5900 Constipation, unspecified: Secondary | ICD-10-CM | POA: Diagnosis not present

## 2022-12-25 DIAGNOSIS — K644 Residual hemorrhoidal skin tags: Secondary | ICD-10-CM | POA: Insufficient documentation

## 2022-12-25 DIAGNOSIS — Z7901 Long term (current) use of anticoagulants: Secondary | ICD-10-CM | POA: Diagnosis not present

## 2022-12-25 DIAGNOSIS — Z7982 Long term (current) use of aspirin: Secondary | ICD-10-CM | POA: Diagnosis not present

## 2022-12-25 HISTORY — DX: Other symptoms and signs involving cognitive functions and awareness: R41.89

## 2022-12-25 LAB — CBC WITH DIFFERENTIAL/PLATELET
Abs Immature Granulocytes: 0.07 10*3/uL (ref 0.00–0.07)
Basophils Absolute: 0 10*3/uL (ref 0.0–0.1)
Basophils Relative: 0 %
Eosinophils Absolute: 0 10*3/uL (ref 0.0–0.5)
Eosinophils Relative: 0 %
HCT: 33.6 % — ABNORMAL LOW (ref 36.0–46.0)
Hemoglobin: 10.4 g/dL — ABNORMAL LOW (ref 12.0–15.0)
Immature Granulocytes: 1 %
Lymphocytes Relative: 3 %
Lymphs Abs: 0.3 10*3/uL — ABNORMAL LOW (ref 0.7–4.0)
MCH: 27.4 pg (ref 26.0–34.0)
MCHC: 31 g/dL (ref 30.0–36.0)
MCV: 88.7 fL (ref 80.0–100.0)
Monocytes Absolute: 0.6 10*3/uL (ref 0.1–1.0)
Monocytes Relative: 5 %
Neutro Abs: 12.3 10*3/uL — ABNORMAL HIGH (ref 1.7–7.7)
Neutrophils Relative %: 91 %
Platelets: 363 10*3/uL (ref 150–400)
RBC: 3.79 MIL/uL — ABNORMAL LOW (ref 3.87–5.11)
RDW: 13.5 % (ref 11.5–15.5)
WBC: 13.3 10*3/uL — ABNORMAL HIGH (ref 4.0–10.5)
nRBC: 0 % (ref 0.0–0.2)

## 2022-12-25 LAB — COMPREHENSIVE METABOLIC PANEL
ALT: 11 U/L (ref 0–44)
AST: 16 U/L (ref 15–41)
Albumin: 3.4 g/dL — ABNORMAL LOW (ref 3.5–5.0)
Alkaline Phosphatase: 64 U/L (ref 38–126)
Anion gap: 11 (ref 5–15)
BUN: 19 mg/dL (ref 8–23)
CO2: 22 mmol/L (ref 22–32)
Calcium: 8.4 mg/dL — ABNORMAL LOW (ref 8.9–10.3)
Chloride: 106 mmol/L (ref 98–111)
Creatinine, Ser: 1.27 mg/dL — ABNORMAL HIGH (ref 0.44–1.00)
GFR, Estimated: 40 mL/min — ABNORMAL LOW (ref >60)
Glucose, Bld: 128 mg/dL — ABNORMAL HIGH (ref 70–99)
Potassium: 4.1 mmol/L (ref 3.5–5.1)
Sodium: 139 mmol/L (ref 135–145)
Total Bilirubin: 0.6 mg/dL (ref 0.3–1.2)
Total Protein: 6.7 g/dL (ref 6.5–8.1)

## 2022-12-25 LAB — PROTIME-INR
INR: 1.5 — ABNORMAL HIGH (ref 0.8–1.2)
Prothrombin Time: 17.6 seconds — ABNORMAL HIGH (ref 11.4–15.2)

## 2022-12-25 LAB — TYPE AND SCREEN
ABO/RH(D): B POS
Antibody Screen: NEGATIVE

## 2022-12-25 MED ORDER — METAMUCIL 0.52 G PO CAPS: 0.5200 g | ORAL_CAPSULE | Freq: Every day | ORAL | 0 refills | Status: AC

## 2022-12-25 MED ORDER — FLEET ENEMA 7-19 GM/118ML RE ENEM: 1.0000 | ENEMA | Freq: Once | RECTAL | 0 refills | Status: AC

## 2022-12-25 MED ORDER — LACTATED RINGERS IV BOLUS
1000.0000 mL | Freq: Once | INTRAVENOUS | Status: AC
  Administered 2022-12-26: 1000 mL via INTRAVENOUS

## 2022-12-25 MED ORDER — CEPHALEXIN 500 MG PO CAPS
500.0000 mg | ORAL_CAPSULE | Freq: Once | ORAL | Status: AC
  Administered 2022-12-25: 500 mg via ORAL
  Filled 2022-12-25: qty 1

## 2022-12-25 MED ORDER — CEPHALEXIN 500 MG PO CAPS: 500.0000 mg | ORAL_CAPSULE | Freq: Four times a day (QID) | ORAL | 0 refills | Status: DC

## 2022-12-25 MED ORDER — POLYETHYLENE GLYCOL 3350 17 G PO PACK: 17.0000 g | PACK | Freq: Every day | ORAL | 0 refills | Status: AC

## 2022-12-25 NOTE — ED Notes (Signed)
This RN called patient's daughter Kathlyn [place] informing her of patient being discharged back to assisted living facility.

## 2022-12-25 NOTE — ED Notes (Signed)
ED Provider at bedside. 

## 2022-12-25 NOTE — ED Provider Triage Note (Signed)
Emergency Medicine Provider Triage Evaluation Note  Leslie Carr , a 89 y.o. female  was evaluated in triage.Marland Kitchen History limited secondary to dementia. Reports bloody bowel movements today. Describes as "dripping red blood" with inability to have a bowel movement despite urge to do so. Reports last bowel movement 2 days ago and was normal. Anticoagulated on Eliquis. Denies abdominal pain or known fever, chills, nausea, vomiting.   Review of Systems  Positive: See HPI Negative: See HPI  Physical Exam  BP 117/82 (BP Location: Right Arm)   Pulse 86   Temp 98 F (36.7 C) (Oral)   Resp 16   SpO2 100%  Gen:   Awake, mild distress secondary to pain Resp:  Normal effort  MSK:   Moves extremities without difficulty  Other:  Nontender abdomen though reports significant rectal pain with sitting, no rebound or guarding to abdomen  Medical Decision Making  Medically screening exam initiated at 3:27 PM.  Appropriate orders placed.  [name] was informed that the remainder of the evaluation will be completed by another provider, this initial triage assessment does not replace that evaluation, and the importance of remaining in the ED until their evaluation is complete.     Suzzette Righter, PA-C 12/25/22 1533

## 2022-12-25 NOTE — ED Provider Notes (Signed)
[facility] DEPT Provider Note   CSN: [id number] Arrival date & time: 12/25/22  1453     History  Chief Complaint  Patient presents with   Constipation   Rectal Bleeding    Leslie Carr is a 89 y.o. female.  Patient is an 89 year old female with a past medical history of dementia, A-fib on Eliquis and hyperlipidemia presenting to the emergency department with constipation.  The patient reports that Leslie Carr has been having normal bowel movements however today had blood mixed in with her bowel movements.  Leslie Carr denies any associated abdominal pain, fevers or chills, nausea or vomiting.  I spoke with the patient's daughter who states that Leslie Carr does have chronic constipation with hemorrhoids and frequently had some blood in her stools.  The history is provided by the patient and a relative. History limited by: Dementia.  Constipation Associated symptoms: hematochezia   Rectal Bleeding      Home Medications Prior to Admission medications   Medication Sig Start Date End Date Taking? Authorizing Provider  cephALEXin (KEFLEX) 500 MG capsule Take 1 capsule (500 mg total) by mouth 4 (four) times daily. 12/25/22  Yes Maylon Peppers, [place] K, DO  polyethylene glycol (MIRALAX) 17 g packet Take 17 g by mouth daily. 12/25/22  Yes Maylon Peppers, [place] K, DO  psyllium (METAMUCIL) 0.52 g capsule Take 1 capsule (0.52 g total) by mouth daily. 12/25/22  Yes Maylon Peppers, [place], DO  sodium phosphate (FLEET) 7-19 GM/118ML ENEM Place 133 mLs (1 enema total) rectally once for 1 dose. 12/25/22 12/25/22 Yes Leanord Asal K, DO  acetaminophen (TYLENOL) 325 MG tablet Take 650 mg by mouth every 6 (six) hours as needed for mild pain or fever. For pain when needed.    [provider]  apixaban (ELIQUIS) 5 MG TABS tablet Take 1 tablet (5 mg total) by mouth 2 (two) times daily. 02/06/20   Geradine Girt, DO  aspirin EC 81 MG EC tablet Take 1 tablet (81 mg total) by mouth daily. 02/06/20    Geradine Girt, DO  docusate sodium (COLACE) 100 MG capsule Take 100 mg by mouth daily as needed for mild constipation.    [provider]  mirtazapine (REMERON) 7.5 MG tablet Take 7.5 mg by mouth at bedtime. 12/02/22   [provider]  Multiple Vitamin (MULTIVITAMIN WITH MINERALS) TABS tablet Take 1 tablet by mouth daily. 12/12/22   Debbe Odea, MD  pravastatin (PRAVACHOL) 40 MG tablet Take 40 mg by mouth daily. 01/02/20   [provider]      Allergies    Lipitor [atorvastatin]    Review of Systems   Review of Systems  Gastrointestinal:  Positive for constipation and hematochezia.    Physical Exam Updated Vital Signs BP 92/68 (BP Location: Right Arm)   Pulse 95   Temp (!) 97.5 F (36.4 C) (Oral)   Resp 16   SpO2 97%  Physical Exam Vitals and nursing note reviewed. Exam conducted with a chaperone present.  Constitutional:      General: Leslie Carr is not in acute distress.    Appearance: Normal appearance.  HENT:     Head: Normocephalic and atraumatic.     Nose: Nose normal.     Mouth/Throat:     Mouth: Mucous membranes are moist.     Pharynx: Oropharynx is clear.  Eyes:     Extraocular Movements: Extraocular movements intact.     Conjunctiva/sclera: Conjunctivae normal.  Cardiovascular:     Rate and Rhythm: Normal  rate and regular rhythm.     Heart sounds: Normal heart sounds.  Pulmonary:     Effort: Pulmonary effort is normal.     Breath sounds: Normal breath sounds.  Abdominal:     General: Abdomen is flat.     Palpations: Abdomen is soft.     Tenderness: There is no abdominal tenderness.  Genitourinary:    Comments: Small area of erythema and warmth along R perianeal region, no induration, no palpable fluctuance  Brown stool in diaper, small non-bleeding hemorrhoid, no visible fissures Musculoskeletal:        General: Normal range of motion.     Cervical back: Normal range of motion and neck supple.  Skin:    General: Skin is warm and  dry.  Neurological:     General: No focal deficit present.     Mental Status: Leslie Carr is alert. Mental status is at baseline.  Psychiatric:        Mood and Affect: Mood normal.        Behavior: Behavior normal.     ED Results / Procedures / Treatments   Labs (all labs ordered are listed, but only abnormal results are displayed) Labs Reviewed  COMPREHENSIVE METABOLIC PANEL - Abnormal; Notable for the following components:      Result Value   Glucose, Bld 128 (*)    Creatinine, Ser 1.27 (*)    Calcium 8.4 (*)    Albumin 3.4 (*)    GFR, Estimated 40 (*)    All other components within normal limits  CBC WITH DIFFERENTIAL/PLATELET - Abnormal; Notable for the following components:   WBC 13.3 (*)    RBC 3.79 (*)    Hemoglobin 10.4 (*)    HCT 33.6 (*)    Neutro Abs 12.3 (*)    Lymphs Abs 0.3 (*)    All other components within normal limits  PROTIME-INR - Abnormal; Notable for the following components:   Prothrombin Time 17.6 (*)    INR 1.5 (*)    All other components within normal limits  TYPE AND SCREEN    EKG None  Radiology CT ABDOMEN PELVIS WO CONTRAST  Result Date: 12/25/2022 CLINICAL DATA:  Constipation rectal bleeding EXAM: CT ABDOMEN AND PELVIS WITHOUT CONTRAST TECHNIQUE: Multidetector CT imaging of the abdomen and pelvis was performed following the standard protocol without IV contrast. RADIATION DOSE REDUCTION: This exam was performed according to the departmental dose-optimization program which includes automated exposure control, adjustment of the mA and/or kV according to patient size and/or use of iterative reconstruction technique. COMPARISON:  CT 12/09/2022, 10/31/2022, 08/12/2021, 06/07/2020, 11/22/2019 FINDINGS: Lower chest: Lung bases demonstrate no acute airspace disease. Mild reticular changes left base. Incompletely visualized partially calcified mass in the posterior right middle lobe, series 4, image 1. Slightly irregular right lower lobe pulmonary nodule  measures 9 mm, series 4, image 19. Previously this measured 7 mm. Hepatobiliary: No focal liver abnormality is seen. No gallstones, gallbladder wall thickening, or biliary dilatation. Pancreas: Unremarkable. No pancreatic ductal dilatation or surrounding inflammatory changes. Spleen: Normal in size without focal abnormality. Adrenals/Urinary Tract: Adrenal glands are unremarkable. Kidneys are normal, without renal calculi, focal lesion, or hydronephrosis. Bladder is unremarkable. Stomach/Bowel: Stomach is within normal limits. Appendix not well seen. No evidence of bowel wall thickening, distention, or inflammatory changes. Diverticular disease of the left colon. Moderate stool burden. Moderate to large fecal retention at the rectum with mild perirectal and presacral edema. Circumferential wall thickening of the anus. Vascular/Lymphatic: Advanced aortic atherosclerosis. Infrarenal  abdominal aortic aneurysm measuring 4.2 cm. Reproductive: Status post hysterectomy. No adnexal masses. Other: Negative for pelvic effusion or free air. Musculoskeletal: No acute or suspicious osseous abnormality. IMPRESSION: 1. Moderate to large fecal retention at the rectum with mild perirectal and presacral edema. There is circumferential wall thickening of the anus which may be inflammatory or neoplastic in etiology. Recommend correlation with direct visualization. 2. Diverticular disease of the left colon without acute inflammatory process. 3. Infrarenal abdominal aortic aneurysm measuring up to 4.2 cm. Recommend follow-up every 12 months and vascular consultation. This recommendation follows ACR consensus guidelines: White Paper of the ACR Incidental Findings Committee II on Vascular Findings. J Am Coll Radiol 2013; 10:789-794. 4. Partially visualized calcified mass in the right middle lobe stable compared with prior. Slight interval enlargement of slightly irregular right lower lobe pulmonary nodule, now measuring up to 9 mm.  Pulmonary follow-up recommended as small neoplastic lung nodule cannot be excluded given continued growth. 5. Aortic atherosclerosis. Aortic Atherosclerosis (ICD10-I70.0). Electronically Signed   By: Donavan Foil M.D.   On: 12/25/2022 19:46    Procedures Procedures    Medications Ordered in ED Medications  cephALEXin (KEFLEX) capsule 500 mg (has no administration in time range)    ED Course/ Medical Decision Making/ A&P                           Medical Decision Making This patient presents to the ED with chief complaint(s) of rectal bleeding with pertinent past medical history of a fib on Eliquis, dementia which further complicates the presenting complaint. The complaint involves an extensive differential diagnosis and also carries with it a high risk of complications and morbidity.    The differential diagnosis includes anemia, GI bleed, hemorrhoid, fissure, diverticulosis, infection constipation  Additional history obtained: Additional history obtained from family Records reviewed [facility]  ED Course and Reassessment: Initially evaluated by provider in triage and had labs and CT abdomen and pelvis performed.  Labs are within normal range and hemoglobin is at baseline.  CT AP showed constipation with moderate stool burden and some inflammatory changes around the rectum.  Leslie Carr does have some erythema and warmth concerning for cellulitis, Leslie Carr has no induration or palpable fluctuance and no drainable abscesses seen on CT imaging.  Leslie Carr will be treated with antibiotics in addition to a given a stool regimen.  Leslie Carr was recommended primary care follow-up and was given strict return precautions.  Independent labs interpretation:  The following labs were independently interpreted: At baseline/within normal range  Independent visualization of imaging: - I independently visualized the following imaging with scope of interpretation limited to determining acute life threatening  conditions related to emergency care: CT AP, which revealed constipation, inflammation around perianal region  Consultation: - Consulted or discussed management/test interpretation w/ external professional: N/A  Consideration for admission or further workup: Patient has no emergent conditions requiring admission or further work-up at this time and is stable for discharge home with primary care follow-up  Social Determinants of health: N/A            Final Clinical Impression(s) / ED Diagnoses Final diagnoses:  Perianal cellulitis  Constipation, unspecified constipation type  Residual hemorrhoidal skin tags    Rx / DC Orders ED Discharge Orders          Ordered    cephALEXin (KEFLEX) 500 MG capsule  4 times daily        12/25/22 2050  psyllium (METAMUCIL) 0.52 g capsule  Daily        12/25/22 2050    polyethylene glycol (MIRALAX) 17 g packet  Daily        12/25/22 2050    sodium phosphate (FLEET) 7-19 GM/118ML ENEM   Once        12/25/22 2050              Kemper Durie, DO 12/25/22 2051

## 2022-12-25 NOTE — Discharge Instructions (Signed)
You were seen in the emergency department for your rectal bleeding.  You had no active bleeding on exam and no signs that you lost too much blood.  You did have some redness around your bottom concerning for skin infection and this will be treated with antibiotics he should complete this as prescribed.  You did appear constipated on your CAT scan I have given you a fiber supplement as well as MiraLAX that you should take daily.  You can cut the MiraLAX dose in half once you are having daily soft bowel movements and then can return to as needed.  I have also given you an enema if you are unable to have a bowel movement despite the MiraLAX and Metamucil.  You should follow-up with your primary doctor in the next few days to have your symptoms rechecked.  He should return to the emergency department for fevers despite the antibiotics, worsening abdominal pain, significant bleeding from the rectum that does not stop or if you have any other new or concerning symptoms.

## 2022-12-25 NOTE — ED Triage Notes (Signed)
Per EMS- Patient is from [place] on the [facility] side. Patient c/o constipation and rectal bleeding. Staff reported that they did not know when she last had a BM since she is on the [facility] side.  Patient has dementia.

## 2022-12-26 DIAGNOSIS — K644 Residual hemorrhoidal skin tags: Secondary | ICD-10-CM | POA: Diagnosis not present

## 2022-12-26 DIAGNOSIS — Z7401 Bed confinement status: Secondary | ICD-10-CM | POA: Diagnosis not present

## 2022-12-26 DIAGNOSIS — R4182 Altered mental status, unspecified: Secondary | ICD-10-CM | POA: Diagnosis not present

## 2022-12-26 DIAGNOSIS — K5900 Constipation, unspecified: Secondary | ICD-10-CM | POA: Diagnosis not present

## 2022-12-26 NOTE — ED Notes (Signed)
Patient transported to [facility] via [facility].

## 2022-12-29 DIAGNOSIS — R279 Unspecified lack of coordination: Secondary | ICD-10-CM | POA: Diagnosis not present

## 2022-12-29 DIAGNOSIS — M6281 Muscle weakness (generalized): Secondary | ICD-10-CM | POA: Diagnosis not present

## 2022-12-29 DIAGNOSIS — R2689 Other abnormalities of gait and mobility: Secondary | ICD-10-CM | POA: Diagnosis not present

## 2022-12-30 DIAGNOSIS — L0390 Cellulitis, unspecified: Secondary | ICD-10-CM | POA: Diagnosis not present

## 2022-12-30 DIAGNOSIS — K649 Unspecified hemorrhoids: Secondary | ICD-10-CM | POA: Diagnosis not present

## 2022-12-30 DIAGNOSIS — K5900 Constipation, unspecified: Secondary | ICD-10-CM | POA: Diagnosis not present

## 2023-01-01 DIAGNOSIS — R2689 Other abnormalities of gait and mobility: Secondary | ICD-10-CM | POA: Diagnosis not present

## 2023-01-01 DIAGNOSIS — M6281 Muscle weakness (generalized): Secondary | ICD-10-CM | POA: Diagnosis not present

## 2023-01-01 DIAGNOSIS — R279 Unspecified lack of coordination: Secondary | ICD-10-CM | POA: Diagnosis not present

## 2023-01-05 DIAGNOSIS — R279 Unspecified lack of coordination: Secondary | ICD-10-CM | POA: Diagnosis not present

## 2023-01-05 DIAGNOSIS — M6281 Muscle weakness (generalized): Secondary | ICD-10-CM | POA: Diagnosis not present

## 2023-01-05 DIAGNOSIS — R2689 Other abnormalities of gait and mobility: Secondary | ICD-10-CM | POA: Diagnosis not present

## 2023-01-08 DIAGNOSIS — R2689 Other abnormalities of gait and mobility: Secondary | ICD-10-CM | POA: Diagnosis not present

## 2023-01-08 DIAGNOSIS — R279 Unspecified lack of coordination: Secondary | ICD-10-CM | POA: Diagnosis not present

## 2023-01-08 DIAGNOSIS — M6281 Muscle weakness (generalized): Secondary | ICD-10-CM | POA: Diagnosis not present

## 2023-01-13 DIAGNOSIS — R2689 Other abnormalities of gait and mobility: Secondary | ICD-10-CM | POA: Diagnosis not present

## 2023-01-13 DIAGNOSIS — M6281 Muscle weakness (generalized): Secondary | ICD-10-CM | POA: Diagnosis not present

## 2023-01-13 DIAGNOSIS — R279 Unspecified lack of coordination: Secondary | ICD-10-CM | POA: Diagnosis not present

## 2023-01-15 DIAGNOSIS — M6281 Muscle weakness (generalized): Secondary | ICD-10-CM | POA: Diagnosis not present

## 2023-01-15 DIAGNOSIS — R2689 Other abnormalities of gait and mobility: Secondary | ICD-10-CM | POA: Diagnosis not present

## 2023-01-15 DIAGNOSIS — R279 Unspecified lack of coordination: Secondary | ICD-10-CM | POA: Diagnosis not present

## 2023-01-19 DIAGNOSIS — D509 Iron deficiency anemia, unspecified: Secondary | ICD-10-CM | POA: Diagnosis not present

## 2023-01-19 DIAGNOSIS — G301 Alzheimer's disease with late onset: Secondary | ICD-10-CM | POA: Diagnosis not present

## 2023-01-20 DIAGNOSIS — R279 Unspecified lack of coordination: Secondary | ICD-10-CM | POA: Diagnosis not present

## 2023-01-20 DIAGNOSIS — I480 Paroxysmal atrial fibrillation: Secondary | ICD-10-CM | POA: Diagnosis not present

## 2023-01-20 DIAGNOSIS — Z7901 Long term (current) use of anticoagulants: Secondary | ICD-10-CM | POA: Diagnosis not present

## 2023-01-20 DIAGNOSIS — M6281 Muscle weakness (generalized): Secondary | ICD-10-CM | POA: Diagnosis not present

## 2023-01-20 DIAGNOSIS — R2689 Other abnormalities of gait and mobility: Secondary | ICD-10-CM | POA: Diagnosis not present

## 2023-01-20 DIAGNOSIS — R21 Rash and other nonspecific skin eruption: Secondary | ICD-10-CM | POA: Diagnosis not present

## 2023-01-20 DIAGNOSIS — Z8673 Personal history of transient ischemic attack (TIA), and cerebral infarction without residual deficits: Secondary | ICD-10-CM | POA: Diagnosis not present

## 2023-01-23 DIAGNOSIS — R2689 Other abnormalities of gait and mobility: Secondary | ICD-10-CM | POA: Diagnosis not present

## 2023-01-23 DIAGNOSIS — M6281 Muscle weakness (generalized): Secondary | ICD-10-CM | POA: Diagnosis not present

## 2023-01-23 DIAGNOSIS — R279 Unspecified lack of coordination: Secondary | ICD-10-CM | POA: Diagnosis not present

## 2023-01-26 DIAGNOSIS — M6281 Muscle weakness (generalized): Secondary | ICD-10-CM | POA: Diagnosis not present

## 2023-01-26 DIAGNOSIS — R279 Unspecified lack of coordination: Secondary | ICD-10-CM | POA: Diagnosis not present

## 2023-01-26 DIAGNOSIS — R2689 Other abnormalities of gait and mobility: Secondary | ICD-10-CM | POA: Diagnosis not present

## 2023-01-28 DIAGNOSIS — R2689 Other abnormalities of gait and mobility: Secondary | ICD-10-CM | POA: Diagnosis not present

## 2023-01-28 DIAGNOSIS — R279 Unspecified lack of coordination: Secondary | ICD-10-CM | POA: Diagnosis not present

## 2023-01-28 DIAGNOSIS — M6281 Muscle weakness (generalized): Secondary | ICD-10-CM | POA: Diagnosis not present

## 2023-01-28 DIAGNOSIS — Z79899 Other long term (current) drug therapy: Secondary | ICD-10-CM | POA: Diagnosis not present

## 2023-01-29 DIAGNOSIS — R011 Cardiac murmur, unspecified: Secondary | ICD-10-CM | POA: Diagnosis not present

## 2023-01-29 DIAGNOSIS — K5901 Slow transit constipation: Secondary | ICD-10-CM | POA: Diagnosis not present

## 2023-01-29 DIAGNOSIS — I480 Paroxysmal atrial fibrillation: Secondary | ICD-10-CM | POA: Diagnosis not present

## 2023-01-29 DIAGNOSIS — Z7901 Long term (current) use of anticoagulants: Secondary | ICD-10-CM | POA: Diagnosis not present

## 2023-01-29 DIAGNOSIS — N1832 Chronic kidney disease, stage 3b: Secondary | ICD-10-CM | POA: Diagnosis not present

## 2023-01-29 DIAGNOSIS — E782 Mixed hyperlipidemia: Secondary | ICD-10-CM | POA: Diagnosis not present

## 2023-02-03 DIAGNOSIS — D509 Iron deficiency anemia, unspecified: Secondary | ICD-10-CM | POA: Diagnosis not present

## 2023-02-03 DIAGNOSIS — R2689 Other abnormalities of gait and mobility: Secondary | ICD-10-CM | POA: Diagnosis not present

## 2023-02-03 DIAGNOSIS — I4581 Long QT syndrome: Secondary | ICD-10-CM | POA: Diagnosis not present

## 2023-02-03 DIAGNOSIS — K5900 Constipation, unspecified: Secondary | ICD-10-CM | POA: Diagnosis not present

## 2023-02-03 DIAGNOSIS — M6281 Muscle weakness (generalized): Secondary | ICD-10-CM | POA: Diagnosis not present

## 2023-02-03 DIAGNOSIS — I350 Nonrheumatic aortic (valve) stenosis: Secondary | ICD-10-CM | POA: Diagnosis not present

## 2023-02-03 DIAGNOSIS — R279 Unspecified lack of coordination: Secondary | ICD-10-CM | POA: Diagnosis not present

## 2023-02-03 DIAGNOSIS — K649 Unspecified hemorrhoids: Secondary | ICD-10-CM | POA: Diagnosis not present

## 2023-02-04 DIAGNOSIS — M6281 Muscle weakness (generalized): Secondary | ICD-10-CM | POA: Diagnosis not present

## 2023-02-04 DIAGNOSIS — R279 Unspecified lack of coordination: Secondary | ICD-10-CM | POA: Diagnosis not present

## 2023-02-04 DIAGNOSIS — R2689 Other abnormalities of gait and mobility: Secondary | ICD-10-CM | POA: Diagnosis not present

## 2023-02-10 DIAGNOSIS — R279 Unspecified lack of coordination: Secondary | ICD-10-CM | POA: Diagnosis not present

## 2023-02-10 DIAGNOSIS — M6281 Muscle weakness (generalized): Secondary | ICD-10-CM | POA: Diagnosis not present

## 2023-02-10 DIAGNOSIS — R2689 Other abnormalities of gait and mobility: Secondary | ICD-10-CM | POA: Diagnosis not present

## 2023-02-12 DIAGNOSIS — M6281 Muscle weakness (generalized): Secondary | ICD-10-CM | POA: Diagnosis not present

## 2023-02-12 DIAGNOSIS — R2689 Other abnormalities of gait and mobility: Secondary | ICD-10-CM | POA: Diagnosis not present

## 2023-02-12 DIAGNOSIS — R279 Unspecified lack of coordination: Secondary | ICD-10-CM | POA: Diagnosis not present

## 2023-02-16 DIAGNOSIS — E782 Mixed hyperlipidemia: Secondary | ICD-10-CM | POA: Diagnosis not present

## 2023-02-16 DIAGNOSIS — D649 Anemia, unspecified: Secondary | ICD-10-CM | POA: Diagnosis not present

## 2023-02-17 DIAGNOSIS — I480 Paroxysmal atrial fibrillation: Secondary | ICD-10-CM | POA: Diagnosis not present

## 2023-02-17 DIAGNOSIS — I350 Nonrheumatic aortic (valve) stenosis: Secondary | ICD-10-CM | POA: Diagnosis not present

## 2023-02-17 DIAGNOSIS — K5900 Constipation, unspecified: Secondary | ICD-10-CM | POA: Diagnosis not present

## 2023-02-18 DIAGNOSIS — R2689 Other abnormalities of gait and mobility: Secondary | ICD-10-CM | POA: Diagnosis not present

## 2023-02-18 DIAGNOSIS — M6281 Muscle weakness (generalized): Secondary | ICD-10-CM | POA: Diagnosis not present

## 2023-02-18 DIAGNOSIS — R279 Unspecified lack of coordination: Secondary | ICD-10-CM | POA: Diagnosis not present

## 2023-02-20 DIAGNOSIS — M6281 Muscle weakness (generalized): Secondary | ICD-10-CM | POA: Diagnosis not present

## 2023-02-20 DIAGNOSIS — R279 Unspecified lack of coordination: Secondary | ICD-10-CM | POA: Diagnosis not present

## 2023-02-20 DIAGNOSIS — R2689 Other abnormalities of gait and mobility: Secondary | ICD-10-CM | POA: Diagnosis not present

## 2023-02-25 DIAGNOSIS — R2689 Other abnormalities of gait and mobility: Secondary | ICD-10-CM | POA: Diagnosis not present

## 2023-02-25 DIAGNOSIS — M6281 Muscle weakness (generalized): Secondary | ICD-10-CM | POA: Diagnosis not present

## 2023-02-25 DIAGNOSIS — R279 Unspecified lack of coordination: Secondary | ICD-10-CM | POA: Diagnosis not present

## 2023-02-27 DIAGNOSIS — R2689 Other abnormalities of gait and mobility: Secondary | ICD-10-CM | POA: Diagnosis not present

## 2023-02-27 DIAGNOSIS — R279 Unspecified lack of coordination: Secondary | ICD-10-CM | POA: Diagnosis not present

## 2023-02-27 DIAGNOSIS — M6281 Muscle weakness (generalized): Secondary | ICD-10-CM | POA: Diagnosis not present

## 2023-03-03 DIAGNOSIS — M6281 Muscle weakness (generalized): Secondary | ICD-10-CM | POA: Diagnosis not present

## 2023-03-03 DIAGNOSIS — R2689 Other abnormalities of gait and mobility: Secondary | ICD-10-CM | POA: Diagnosis not present

## 2023-03-03 DIAGNOSIS — R279 Unspecified lack of coordination: Secondary | ICD-10-CM | POA: Diagnosis not present

## 2023-03-04 DIAGNOSIS — I129 Hypertensive chronic kidney disease with stage 1 through stage 4 chronic kidney disease, or unspecified chronic kidney disease: Secondary | ICD-10-CM | POA: Diagnosis not present

## 2023-03-04 DIAGNOSIS — D631 Anemia in chronic kidney disease: Secondary | ICD-10-CM | POA: Diagnosis not present

## 2023-03-04 DIAGNOSIS — N183 Chronic kidney disease, stage 3 unspecified: Secondary | ICD-10-CM | POA: Diagnosis not present

## 2023-03-04 DIAGNOSIS — I509 Heart failure, unspecified: Secondary | ICD-10-CM | POA: Diagnosis not present

## 2023-03-04 DIAGNOSIS — N2581 Secondary hyperparathyroidism of renal origin: Secondary | ICD-10-CM | POA: Diagnosis not present

## 2023-03-04 DIAGNOSIS — I480 Paroxysmal atrial fibrillation: Secondary | ICD-10-CM | POA: Diagnosis not present

## 2023-03-05 DIAGNOSIS — R279 Unspecified lack of coordination: Secondary | ICD-10-CM | POA: Diagnosis not present

## 2023-03-05 DIAGNOSIS — R2689 Other abnormalities of gait and mobility: Secondary | ICD-10-CM | POA: Diagnosis not present

## 2023-03-05 DIAGNOSIS — M6281 Muscle weakness (generalized): Secondary | ICD-10-CM | POA: Diagnosis not present

## 2023-03-17 DIAGNOSIS — F0280 Dementia in other diseases classified elsewhere without behavioral disturbance: Secondary | ICD-10-CM | POA: Diagnosis not present

## 2023-03-17 DIAGNOSIS — E782 Mixed hyperlipidemia: Secondary | ICD-10-CM | POA: Diagnosis not present

## 2023-03-17 DIAGNOSIS — D509 Iron deficiency anemia, unspecified: Secondary | ICD-10-CM | POA: Diagnosis not present

## 2023-03-17 DIAGNOSIS — R7303 Prediabetes: Secondary | ICD-10-CM | POA: Diagnosis not present

## 2023-03-26 ENCOUNTER — Emergency Department (HOSPITAL_COMMUNITY): Payer: Medicare Other

## 2023-03-26 ENCOUNTER — Emergency Department (HOSPITAL_COMMUNITY)
Admission: EM | Admit: 2023-03-26 | Discharge: 2023-03-27 | Disposition: A | Discharge status: Home / Self Care | Payer: Medicare Other | Attending: Emergency Medicine | Admitting: Emergency Medicine

## 2023-03-26 DIAGNOSIS — I712 Thoracic aortic aneurysm, without rupture, unspecified: Secondary | ICD-10-CM | POA: Diagnosis not present

## 2023-03-26 DIAGNOSIS — R0789 Other chest pain: Secondary | ICD-10-CM | POA: Diagnosis not present

## 2023-03-26 DIAGNOSIS — Z7982 Long term (current) use of aspirin: Secondary | ICD-10-CM | POA: Diagnosis not present

## 2023-03-26 DIAGNOSIS — I7143 Infrarenal abdominal aortic aneurysm, without rupture: Secondary | ICD-10-CM | POA: Diagnosis not present

## 2023-03-26 DIAGNOSIS — R079 Chest pain, unspecified: Secondary | ICD-10-CM | POA: Diagnosis not present

## 2023-03-26 DIAGNOSIS — R000 Tachycardia, unspecified: Secondary | ICD-10-CM | POA: Insufficient documentation

## 2023-03-26 DIAGNOSIS — Z7901 Long term (current) use of anticoagulants: Secondary | ICD-10-CM | POA: Diagnosis not present

## 2023-03-26 LAB — CBC WITH DIFFERENTIAL/PLATELET
Abs Immature Granulocytes: 0.05 10*3/uL (ref 0.00–0.07)
Basophils Absolute: 0.1 10*3/uL (ref 0.0–0.1)
Basophils Relative: 1 %
Eosinophils Absolute: 0.4 10*3/uL (ref 0.0–0.5)
Eosinophils Relative: 7 %
HCT: 35.7 % — ABNORMAL LOW (ref 36.0–46.0)
Hemoglobin: 11 g/dL — ABNORMAL LOW (ref 12.0–15.0)
Immature Granulocytes: 1 %
Lymphocytes Relative: 9 %
Lymphs Abs: 0.6 10*3/uL — ABNORMAL LOW (ref 0.7–4.0)
MCH: 26.7 pg (ref 26.0–34.0)
MCHC: 30.8 g/dL (ref 30.0–36.0)
MCV: 86.7 fL (ref 80.0–100.0)
Monocytes Absolute: 0.7 10*3/uL (ref 0.1–1.0)
Monocytes Relative: 11 %
Neutro Abs: 4.8 10*3/uL (ref 1.7–7.7)
Neutrophils Relative %: 71 %
Platelets: 330 10*3/uL (ref 150–400)
RBC: 4.12 MIL/uL (ref 3.87–5.11)
RDW: 17.8 % — ABNORMAL HIGH (ref 11.5–15.5)
WBC: 6.7 10*3/uL (ref 4.0–10.5)
nRBC: 0 % (ref 0.0–0.2)

## 2023-03-26 LAB — BASIC METABOLIC PANEL
Anion gap: 11 (ref 5–15)
BUN: 25 mg/dL — ABNORMAL HIGH (ref 8–23)
CO2: 26 mmol/L (ref 22–32)
Calcium: 8.7 mg/dL — ABNORMAL LOW (ref 8.9–10.3)
Chloride: 103 mmol/L (ref 98–111)
Creatinine, Ser: 1.5 mg/dL — ABNORMAL HIGH (ref 0.44–1.00)
GFR, Estimated: 33 mL/min — ABNORMAL LOW (ref >60)
Glucose, Bld: 85 mg/dL (ref 70–99)
Potassium: 3.9 mmol/L (ref 3.5–5.1)
Sodium: 140 mmol/L (ref 135–145)

## 2023-03-26 LAB — TROPONIN I (HIGH SENSITIVITY)
Troponin I (High Sensitivity): 11 ng/L (ref <18)
Troponin I (High Sensitivity): 11 ng/L (ref <18)

## 2023-03-26 MED ORDER — SODIUM CHLORIDE 0.9 % IV BOLUS
500.0000 mL | Freq: Once | INTRAVENOUS | Status: AC
  Administered 2023-03-26: 500 mL via INTRAVENOUS

## 2023-03-26 MED ORDER — IOHEXOL 350 MG/ML SOLN
75.0000 mL | Freq: Once | INTRAVENOUS | Status: AC | PRN
  Administered 2023-03-26: 75 mL via INTRAVENOUS

## 2023-03-26 MED ORDER — LORAZEPAM 2 MG/ML IJ SOLN
0.5000 mg | Freq: Once | INTRAMUSCULAR | Status: AC
  Administered 2023-03-26: 0.5 mg via INTRAVENOUS
  Filled 2023-03-26: qty 1

## 2023-03-26 NOTE — ED Provider Notes (Signed)
[facility] Provider Note   CSN: [id number] Arrival date & time: 03/26/23  1904     History  Chief Complaint  Patient presents with   Chest Pain    KEYRY LENKER is a 89 y.o. female.  89 year old female with prior medical history as detailed below presents for evaluation.  Patient is a resident at [facility].  Patient complained of right-sided chest discomfort.  Chest discomfort lasted approximately 30 seconds.  She has no chest pain on evaluation here in the ED.  She denies associated symptoms such as nausea, vomiting, shortness of breath, diaphoresis, other complaint.    The history is provided by the patient and medical records.       Home Medications Prior to Admission medications   Medication Sig Start Date End Date Taking? Authorizing Provider  acetaminophen (TYLENOL) 325 MG tablet Take 650 mg by mouth every 6 (six) hours as needed for mild pain or fever. For pain when needed.    [provider]  apixaban (ELIQUIS) 5 MG TABS tablet Take 1 tablet (5 mg total) by mouth 2 (two) times daily. 02/06/20   Geradine Girt, DO  aspirin EC 81 MG EC tablet Take 1 tablet (81 mg total) by mouth daily. 02/06/20   Geradine Girt, DO  cephALEXin (KEFLEX) 500 MG capsule Take 1 capsule (500 mg total) by mouth 4 (four) times daily. 12/25/22   Leanord Asal K, DO  docusate sodium (COLACE) 100 MG capsule Take 100 mg by mouth daily as needed for mild constipation.    [provider]  mirtazapine (REMERON) 7.5 MG tablet Take 7.5 mg by mouth at bedtime. 12/02/22   [provider]  Multiple Vitamin (MULTIVITAMIN WITH MINERALS) TABS tablet Take 1 tablet by mouth daily. 12/12/22   Debbe Odea, MD  polyethylene glycol (MIRALAX) 17 g packet Take 17 g by mouth daily. 12/25/22   Leanord Asal K, DO  pravastatin (PRAVACHOL) 40 MG tablet Take 40 mg by mouth daily. 01/02/20   [provider]  psyllium  (METAMUCIL) 0.52 g capsule Take 1 capsule (0.52 g total) by mouth daily. 12/25/22   Kemper Durie, DO      Allergies    Lipitor [atorvastatin]    Review of Systems   Review of Systems  All other systems reviewed and are negative.   Physical Exam Updated Vital Signs BP (!) 146/99 (BP Location: Right Arm)   Pulse 91   Temp 98.1 F (36.7 C) (Oral)   Resp 20   Ht 5\' 3"  (1.6 m)   Wt 59 kg   SpO2 100%   BMI 23.03 kg/m  Physical Exam Vitals and nursing note reviewed.  Constitutional:      General: She is not in acute distress.    Appearance: Normal appearance. She is well-developed.  HENT:     Head: Normocephalic and atraumatic.  Eyes:     Conjunctiva/sclera: Conjunctivae normal.     Pupils: Pupils are equal, round, and reactive to light.  Cardiovascular:     Rate and Rhythm: Normal rate and regular rhythm.     Heart sounds: Normal heart sounds.  Pulmonary:     Effort: Pulmonary effort is normal. No respiratory distress.     Breath sounds: Normal breath sounds.  Abdominal:     General: There is no distension.     Palpations: Abdomen is soft.     Tenderness: There is no abdominal tenderness.  Musculoskeletal:  General: No deformity. Normal range of motion.     Cervical back: Normal range of motion and neck supple.  Skin:    General: Skin is warm and dry.  Neurological:     General: No focal deficit present.     Mental Status: She is alert and oriented to person, place, and time.     ED Results / Procedures / Treatments   Labs (all labs ordered are listed, but only abnormal results are displayed) Labs Reviewed  CBC WITH DIFFERENTIAL/PLATELET - Abnormal; Notable for the following components:      Result Value   Hemoglobin 11.0 (*)    HCT 35.7 (*)    RDW 17.8 (*)    Lymphs Abs 0.6 (*)    All other components within normal limits  BASIC METABOLIC PANEL - Abnormal; Notable for the following components:   BUN 25 (*)    Creatinine, Ser 1.50 (*)     Calcium 8.7 (*)    GFR, Estimated 33 (*)    All other components within normal limits  TROPONIN I (HIGH SENSITIVITY)  TROPONIN I (HIGH SENSITIVITY)    EKG EKG Interpretation  Date/Time:  Thursday March 26 2023 19:12:01 EDT Ventricular Rate:  102 PR Interval:  159 QRS Duration: 107 QT Interval:  362 QTC Calculation: 472 R Axis:   -19 Text Interpretation: Sinus tachycardia Multiple ventricular premature complexes Low voltage, precordial leads Probable left ventricular hypertrophy Confirmed by Dene Gentry [phone]) on 03/26/2023 7:14:41 PM  Radiology CT Angio Chest/Abd/Pel for Dissection W and/or Wo Contrast  Result Date: 03/26/2023 CLINICAL DATA:  Acute aortic syndrome, chest and upper back pain EXAM: CT ANGIOGRAPHY CHEST, ABDOMEN AND PELVIS TECHNIQUE: Non-contrast CT of the chest was initially obtained. Multidetector CT imaging through the chest, abdomen and pelvis was performed using the standard protocol during bolus administration of intravenous contrast. Multiplanar reconstructed images and MIPs were obtained and reviewed to evaluate the vascular anatomy. RADIATION DOSE REDUCTION: This exam was performed according to the departmental dose-optimization program which includes automated exposure control, adjustment of the mA and/or kV according to patient size and/or use of iterative reconstruction technique. CONTRAST:  70mL OMNIPAQUE IOHEXOL 350 MG/ML SOLN COMPARISON:  12/09/2022 FINDINGS: CTA CHEST FINDINGS Cardiovascular: Stable fusiform aneurysm of the ascending thoracic aorta measuring 4.4 cm in greatest dimension. Descending thoracic aorta is of normal caliber. No intramural hematoma or dissection. Extensive mixed atherosclerotic plaque with areas of plaque ulceration identified within the aortic arch and descending thoracic aorta best seen at axial image # 22 and # 35, series # 6 and sagittal image # 28 and # 93, series # 10. Extensive circumferential soft plaque results in near occlusion  of the left subclavian artery proximally, axial image # 19/6 and extensive mixed atherosclerotic plaque results in occlusion of this vessel distal to the thoraco acromial artery. Extensive multi-vessel coronary artery calcification. Global cardiac size within normal limits. Degenerative calcification of the aortic valve leaflets. No pericardial effusion. Central pulmonary arteries are of normal caliber. Mediastinum/Nodes: No pathologic mediastinal adenopathy. Esophagus unremarkable. Small hiatal hernia. Lungs/Pleura: Stable partially calcified pleural based right middle lobe pulmonary mass measuring 2.3 x 3.7 cm previously biopsied on 04/27/2018 which demonstrated amyloid deposition and no evidence of malignancy. This is stable since prior examination. Stable 1 cm pulmonary nodule within the right apex, axial image # 14/7. Stable left upper lobe paramediastinal volume loss and fibrosis. Interval development of ground-glass pulmonary infiltrate and more focal peripheral consolidation within the left lower lobe, possibly infectious or inflammatory  in the acute setting. Stable trace left pleural effusion. No pneumothorax. No central obstructing lesion. Musculoskeletal: No acute bone abnormality. No lytic or blastic bone lesion. Review of the MIP images confirms the above findings. CTA ABDOMEN AND PELVIS FINDINGS VASCULAR Aorta: Saccular infrarenal abdominal aortic aneurysm is stable, measuring 4.1 x 4.1 cm in diameter at axial image # 102/6 demonstrating similar configuration. Extensive superimposed atherosclerotic calcification. No periaortic inflammatory change or fluid collections. Celiac: 50% stenosis at the origin.  Distally patent. SMA: Irregular plaque at the origin results in a 50-75% stenosis with subsequent scattered moderate mixed atherosclerotic plaque throughout the proximal and mid superior mesenteric artery. Distally patent. Renals: Mixed atherosclerotic plaque results in 50-75% stenoses of the renal  artery origins bilaterally. Distally normal vascular morphology. No aneurysm or dissection. IMA: Greater than 75% stenosis at the origin. Distally widely patent. Inflow: Patent without evidence of aneurysm, dissection, vasculitis or significant stenosis. Veins: No obvious venous abnormality within the limitations of this arterial phase study. Review of the MIP images confirms the above findings. NON-VASCULAR Hepatobiliary: No focal liver abnormality is seen. No gallstones, gallbladder wall thickening, or biliary dilatation. Pancreas: Unremarkable Spleen: Unremarkable Adrenals/Urinary Tract: Adrenal glands are unremarkable. Kidneys are normal, without renal calculi, focal lesion, or hydronephrosis. Bladder is unremarkable. Stomach/Bowel: Extensive sigmoid diverticulosis without superimposed acute inflammatory change. Moderate colonic stool burden without evidence of obstruction. There is circumferential wall thickening involving the gastric antrum, best seen on axial image # 99/6 which may reflect underlying antritis. Infiltrative disease, however, is not excluded and this is not well assessed on this examination. The stomach, small bowel, and large bowel are otherwise unremarkable. No free intraperitoneal gas or fluid. Appendix absent. Lymphatic: No pathologic adenopathy within the abdomen and pelvis. Reproductive: Status post hysterectomy. No adnexal masses. Other: No abdominal wall hernia Musculoskeletal: Osseous structures are diffusely osteopenic. There is progressive loss of height of the L1 superior endplate fracture which now demonstrates approximately 25% loss of height. Posterior wall appears intact and there is no retropulsion or listhesis. No lytic or blastic bone lesion. Review of the MIP images confirms the above findings. IMPRESSION: 1. No evidence of thoracoabdominal aortic dissection or intramural hematoma. Extensive mixed atherosclerotic plaque with areas of plaque ulceration within the aortic arch  and descending thoracic aorta. 2. Stable 4.4 cm fusiform aneurysm of the ascending thoracic aorta. Recommend annual imaging followup by CTA or MRA. This recommendation follows 2010 ACCF/AHA/AATS/ACR/ASA/SCA/SCAI/SIR/STS/SVM Guidelines for the Diagnosis and Management of Patients with Thoracic Aortic Disease. Circulation. 2010; [id number]. Aortic aneurysm NOS (ICD10-I71.9) 3. Extensive multi-vessel coronary artery calcification. 4. Degenerative calcification of the aortic valve leaflets. 5. Stable partially calcified pleural based right middle lobe pulmonary mass previously biopsied on 04/27/2018 which demonstrated amyloid deposition and no evidence of malignancy. 6. Stable 1 cm right apical pulmonary nodule since prior examination of 12/09/2022. Follow-up examination in 6 months may be helpful to document continued stability if clinically indicated given the patient's comorbidities. 7. Interval development of ground-glass pulmonary infiltrate and more focal peripheral consolidation within the left lower lobe, possibly infectious or inflammatory in the acute setting. Stable trace left pleural effusion. 8. Stable 4.1 cm infrarenal abdominal aortic aneurysm. Given the saccular configuration, recommend referral to a vascular specialist if clinically indicated given the patient's comorbidities. This recommendation follows ACR consensus guidelines: White Paper of the ACR Incidental Findings Committee II on Vascular Findings. J Am Coll Radiol [id number]:6262728. 9. Peripheral vascular disease. Occlusion of the left subclavian artery. Hemodynamically significant stenoses of the mesenteric  artery origins and renal artery origins bilaterally. Clinical correlation for signs and symptoms of subclavian steal, chronic mesenteric ischemia, or hemodynamically significant renal artery stenosis may be helpful. 10. Circumferential wall thickening involving the gastric antrum which may reflect underlying antritis. Infiltrative  disease, however, is not excluded and this is not well assessed on this examination and endoscopy may be helpful for further evaluation. 11. Extensive sigmoid diverticulosis without superimposed acute inflammatory change. Aortic Atherosclerosis (ICD10-I70.0). Electronically Signed   By: Fidela Salisbury M.D.   On: 03/26/2023 22:27   DG Chest Port 1 View  Result Date: 03/26/2023 CLINICAL DATA:  Chest pain EXAM: PORTABLE CHEST 1 VIEW COMPARISON:  12/08/2022 FINDINGS: Minimal left basilar scarring, unchanged. Lungs are otherwise clear. No pneumothorax or pleural effusion. Cardiac size is within normal limits. Ascending thoracic aortic aneurysm is better assessed on prior CT examination of 12/09/2022. Extensive atherosclerotic calcification. Pulmonary vascularity is normal. No acute bone abnormality. Remote healed left rib fracture and advanced degenerative changes within the left shoulder noted. IMPRESSION: 1. No radiographic evidence of acute cardiopulmonary disease. 2. Ascending thoracic aortic aneurysm Electronically Signed   By: Fidela Salisbury M.D.   On: 03/26/2023 20:04    Procedures Procedures    Medications Ordered in ED Medications  LORazepam (ATIVAN) injection 0.5 mg (0.5 mg Intravenous Given 03/26/23 1929)  sodium chloride 0.9 % bolus 500 mL (500 mLs Intravenous New Bag/Given 03/26/23 2046)  iohexol (OMNIPAQUE) 350 MG/ML injection 75 mL (75 mLs Intravenous Contrast Given 03/26/23 2144)    ED Course/ Medical Decision Making/ A&P                             Medical Decision Making Amount and/or Complexity of Data Reviewed Labs: ordered. Radiology: ordered.  Risk Prescription drug management.    Medical Screen Complete  This patient presented to the ED with complaint of chest pain.  This complaint involves an extensive number of treatment options. The initial differential diagnosis includes, but is not limited to, ACS, angina, aortic pathology, metabolic abnormality, etc.  This  presentation is: Acute, Chronic, Self-Limited, Previously Undiagnosed, Uncertain Prognosis, Complicated, Systemic Symptoms, and Threat to Life/Bodily Function  Patient presents with complaint of atypical chest discomfort.  EKG is not suggestive of acute ischemia.  Troponin x 2 was without significant elevation or delta.  CT imaging does not reveal significant acute pathology.  Specifically patient's known thoracic aortic aneurysm is without change.  Patient reports that her outpatient care providers are currently following this aneurysm.  CT does suggest possible new consolidation in left lung.  Patient without symptoms concerning for likely pulmonary infection.  Patient offered overnight observation and/or additional workup.  She declined same.  She desires discharge home.  Contact attempted with patient's daughter.  Message left on patient's daughter's phone.  Importance of close outpatient follow-up stressed.  Strict return precautions given and understood.  Additional history obtained:  External records from outside sources obtained and reviewed including prior ED visits and prior Inpatient records.    Lab Tests:  I ordered and personally interpreted labs.  The pertinent results include: CBC, BMP, troponin x 2   Imaging Studies ordered:  I ordered imaging studies including chest x-ray, CT angio chest abdomen pelvis I independently visualized and interpreted obtained imaging which showed no acute pathology I agree with the radiologist interpretation.   Cardiac Monitoring:  The patient was maintained on a cardiac monitor.  I personally viewed and interpreted the cardiac monitor  which showed an underlying rhythm of: NSR   Medicines ordered:  I ordered medication including Ativan for anxiety Reevaluation of the patient after these medicines showed that the patient: improved   Problem List / ED Course:  Atypical chest pain   Reevaluation:  After the interventions  noted above, I reevaluated the patient and found that they have: improved   Disposition:  After consideration of the diagnostic results and the patients response to treatment, I feel that the patent would benefit from close outpatient follow-up.          Final Clinical Impression(s) / ED Diagnoses Final diagnoses:  Atypical chest pain    Rx / DC Orders ED Discharge Orders     None         Valarie Merino, MD 03/26/23 2340

## 2023-03-26 NOTE — ED Triage Notes (Signed)
Patient bib [facility] from [facility]. Patient states the pain lasted 30 seconds. Patient states it was in the right chest and did not radiate. Patient denies chest pain now. Denies N/V. A&Ox4

## 2023-03-26 NOTE — Discharge Instructions (Signed)
Return for any problem.  ?

## 2023-03-26 NOTE — ED Notes (Signed)
[facility] called, No ETA 

## 2023-03-26 NOTE — ED Notes (Signed)
Report called to [facility]. Report given to [facility]

## 2023-03-27 DIAGNOSIS — Z7401 Bed confinement status: Secondary | ICD-10-CM | POA: Diagnosis not present

## 2023-03-27 DIAGNOSIS — R0789 Other chest pain: Secondary | ICD-10-CM | POA: Diagnosis not present

## 2023-03-31 DIAGNOSIS — D509 Iron deficiency anemia, unspecified: Secondary | ICD-10-CM | POA: Diagnosis not present

## 2023-03-31 DIAGNOSIS — I69311 Memory deficit following cerebral infarction: Secondary | ICD-10-CM | POA: Diagnosis not present

## 2023-03-31 DIAGNOSIS — E785 Hyperlipidemia, unspecified: Secondary | ICD-10-CM | POA: Diagnosis not present

## 2023-03-31 DIAGNOSIS — K5904 Chronic idiopathic constipation: Secondary | ICD-10-CM | POA: Diagnosis not present

## 2023-04-14 DIAGNOSIS — D509 Iron deficiency anemia, unspecified: Secondary | ICD-10-CM | POA: Diagnosis not present

## 2023-04-14 DIAGNOSIS — Z8673 Personal history of transient ischemic attack (TIA), and cerebral infarction without residual deficits: Secondary | ICD-10-CM | POA: Diagnosis not present

## 2023-04-14 DIAGNOSIS — G301 Alzheimer's disease with late onset: Secondary | ICD-10-CM | POA: Diagnosis not present

## 2023-04-14 DIAGNOSIS — I480 Paroxysmal atrial fibrillation: Secondary | ICD-10-CM | POA: Diagnosis not present

## 2023-04-14 DIAGNOSIS — I69311 Memory deficit following cerebral infarction: Secondary | ICD-10-CM | POA: Diagnosis not present

## 2023-04-14 DIAGNOSIS — K5900 Constipation, unspecified: Secondary | ICD-10-CM | POA: Diagnosis not present

## 2023-04-15 DIAGNOSIS — Z7902 Long term (current) use of antithrombotics/antiplatelets: Secondary | ICD-10-CM | POA: Diagnosis not present

## 2023-04-15 DIAGNOSIS — Z7901 Long term (current) use of anticoagulants: Secondary | ICD-10-CM | POA: Diagnosis not present

## 2023-04-15 DIAGNOSIS — I480 Paroxysmal atrial fibrillation: Secondary | ICD-10-CM | POA: Diagnosis not present

## 2023-04-15 DIAGNOSIS — E782 Mixed hyperlipidemia: Secondary | ICD-10-CM | POA: Diagnosis not present

## 2023-04-15 DIAGNOSIS — D509 Iron deficiency anemia, unspecified: Secondary | ICD-10-CM | POA: Diagnosis not present

## 2023-04-15 DIAGNOSIS — K5901 Slow transit constipation: Secondary | ICD-10-CM | POA: Diagnosis not present

## 2023-04-15 DIAGNOSIS — N1832 Chronic kidney disease, stage 3b: Secondary | ICD-10-CM | POA: Diagnosis not present

## 2023-04-23 DIAGNOSIS — R300 Dysuria: Secondary | ICD-10-CM | POA: Diagnosis not present

## 2023-05-12 DIAGNOSIS — F0150 Vascular dementia without behavioral disturbance: Secondary | ICD-10-CM | POA: Diagnosis not present

## 2023-05-12 DIAGNOSIS — I480 Paroxysmal atrial fibrillation: Secondary | ICD-10-CM | POA: Diagnosis not present

## 2023-05-12 DIAGNOSIS — K5900 Constipation, unspecified: Secondary | ICD-10-CM | POA: Diagnosis not present

## 2023-05-14 DIAGNOSIS — R300 Dysuria: Secondary | ICD-10-CM | POA: Diagnosis not present

## 2023-06-09 DIAGNOSIS — Z7902 Long term (current) use of antithrombotics/antiplatelets: Secondary | ICD-10-CM | POA: Diagnosis not present

## 2023-06-09 DIAGNOSIS — N390 Urinary tract infection, site not specified: Secondary | ICD-10-CM | POA: Diagnosis not present

## 2023-06-09 DIAGNOSIS — Z7901 Long term (current) use of anticoagulants: Secondary | ICD-10-CM | POA: Diagnosis not present

## 2023-06-09 DIAGNOSIS — E782 Mixed hyperlipidemia: Secondary | ICD-10-CM | POA: Diagnosis not present

## 2023-06-09 DIAGNOSIS — I480 Paroxysmal atrial fibrillation: Secondary | ICD-10-CM | POA: Diagnosis not present

## 2023-06-09 DIAGNOSIS — K5900 Constipation, unspecified: Secondary | ICD-10-CM | POA: Diagnosis not present

## 2023-06-09 DIAGNOSIS — Z79899 Other long term (current) drug therapy: Secondary | ICD-10-CM | POA: Diagnosis not present

## 2023-07-10 DIAGNOSIS — R4189 Other symptoms and signs involving cognitive functions and awareness: Secondary | ICD-10-CM | POA: Diagnosis not present

## 2023-07-10 DIAGNOSIS — I350 Nonrheumatic aortic (valve) stenosis: Secondary | ICD-10-CM | POA: Diagnosis not present

## 2023-07-10 DIAGNOSIS — Z7901 Long term (current) use of anticoagulants: Secondary | ICD-10-CM | POA: Diagnosis not present

## 2023-08-04 DIAGNOSIS — I480 Paroxysmal atrial fibrillation: Secondary | ICD-10-CM | POA: Diagnosis not present

## 2023-08-04 DIAGNOSIS — K5900 Constipation, unspecified: Secondary | ICD-10-CM | POA: Diagnosis not present

## 2023-08-04 DIAGNOSIS — Z79899 Other long term (current) drug therapy: Secondary | ICD-10-CM | POA: Diagnosis not present

## 2023-08-04 DIAGNOSIS — I350 Nonrheumatic aortic (valve) stenosis: Secondary | ICD-10-CM | POA: Diagnosis not present

## 2023-08-04 DIAGNOSIS — Z7901 Long term (current) use of anticoagulants: Secondary | ICD-10-CM | POA: Diagnosis not present

## 2023-08-05 DIAGNOSIS — K5901 Slow transit constipation: Secondary | ICD-10-CM | POA: Diagnosis not present

## 2023-08-05 DIAGNOSIS — D508 Other iron deficiency anemias: Secondary | ICD-10-CM | POA: Diagnosis not present

## 2023-08-05 DIAGNOSIS — Z7982 Long term (current) use of aspirin: Secondary | ICD-10-CM | POA: Diagnosis not present

## 2023-08-05 DIAGNOSIS — N1832 Chronic kidney disease, stage 3b: Secondary | ICD-10-CM | POA: Diagnosis not present

## 2023-08-05 DIAGNOSIS — I480 Paroxysmal atrial fibrillation: Secondary | ICD-10-CM | POA: Diagnosis not present

## 2023-08-05 DIAGNOSIS — Z7901 Long term (current) use of anticoagulants: Secondary | ICD-10-CM | POA: Diagnosis not present

## 2023-08-05 DIAGNOSIS — E782 Mixed hyperlipidemia: Secondary | ICD-10-CM | POA: Diagnosis not present

## 2023-08-07 DIAGNOSIS — E785 Hyperlipidemia, unspecified: Secondary | ICD-10-CM | POA: Diagnosis not present

## 2023-08-07 DIAGNOSIS — D509 Iron deficiency anemia, unspecified: Secondary | ICD-10-CM | POA: Diagnosis not present

## 2023-09-01 DIAGNOSIS — K5901 Slow transit constipation: Secondary | ICD-10-CM | POA: Diagnosis not present

## 2023-09-01 DIAGNOSIS — D508 Other iron deficiency anemias: Secondary | ICD-10-CM | POA: Diagnosis not present

## 2023-09-01 DIAGNOSIS — I480 Paroxysmal atrial fibrillation: Secondary | ICD-10-CM | POA: Diagnosis not present

## 2023-09-15 DIAGNOSIS — R2681 Unsteadiness on feet: Secondary | ICD-10-CM | POA: Diagnosis not present

## 2023-09-15 DIAGNOSIS — M62512 Muscle wasting and atrophy, not elsewhere classified, left shoulder: Secondary | ICD-10-CM | POA: Diagnosis not present

## 2023-09-16 DIAGNOSIS — M62512 Muscle wasting and atrophy, not elsewhere classified, left shoulder: Secondary | ICD-10-CM | POA: Diagnosis not present

## 2023-09-16 DIAGNOSIS — R2681 Unsteadiness on feet: Secondary | ICD-10-CM | POA: Diagnosis not present

## 2023-09-17 DIAGNOSIS — M62512 Muscle wasting and atrophy, not elsewhere classified, left shoulder: Secondary | ICD-10-CM | POA: Diagnosis not present

## 2023-09-17 DIAGNOSIS — R2681 Unsteadiness on feet: Secondary | ICD-10-CM | POA: Diagnosis not present

## 2023-09-18 DIAGNOSIS — R2681 Unsteadiness on feet: Secondary | ICD-10-CM | POA: Diagnosis not present

## 2023-09-18 DIAGNOSIS — M62512 Muscle wasting and atrophy, not elsewhere classified, left shoulder: Secondary | ICD-10-CM | POA: Diagnosis not present

## 2023-09-21 DIAGNOSIS — M62512 Muscle wasting and atrophy, not elsewhere classified, left shoulder: Secondary | ICD-10-CM | POA: Diagnosis not present

## 2023-09-21 DIAGNOSIS — D509 Iron deficiency anemia, unspecified: Secondary | ICD-10-CM | POA: Diagnosis not present

## 2023-09-21 DIAGNOSIS — E785 Hyperlipidemia, unspecified: Secondary | ICD-10-CM | POA: Diagnosis not present

## 2023-09-21 DIAGNOSIS — R2681 Unsteadiness on feet: Secondary | ICD-10-CM | POA: Diagnosis not present

## 2023-09-22 DIAGNOSIS — I350 Nonrheumatic aortic (valve) stenosis: Secondary | ICD-10-CM | POA: Diagnosis not present

## 2023-09-22 DIAGNOSIS — K5901 Slow transit constipation: Secondary | ICD-10-CM | POA: Diagnosis not present

## 2023-09-22 DIAGNOSIS — I480 Paroxysmal atrial fibrillation: Secondary | ICD-10-CM | POA: Diagnosis not present

## 2023-09-22 DIAGNOSIS — M62512 Muscle wasting and atrophy, not elsewhere classified, left shoulder: Secondary | ICD-10-CM | POA: Diagnosis not present

## 2023-09-22 DIAGNOSIS — R2681 Unsteadiness on feet: Secondary | ICD-10-CM | POA: Diagnosis not present

## 2023-09-24 DIAGNOSIS — M62512 Muscle wasting and atrophy, not elsewhere classified, left shoulder: Secondary | ICD-10-CM | POA: Diagnosis not present

## 2023-09-24 DIAGNOSIS — R2681 Unsteadiness on feet: Secondary | ICD-10-CM | POA: Diagnosis not present

## 2023-09-29 DIAGNOSIS — M62512 Muscle wasting and atrophy, not elsewhere classified, left shoulder: Secondary | ICD-10-CM | POA: Diagnosis not present

## 2023-09-29 DIAGNOSIS — R2681 Unsteadiness on feet: Secondary | ICD-10-CM | POA: Diagnosis not present

## 2023-10-01 DIAGNOSIS — R2681 Unsteadiness on feet: Secondary | ICD-10-CM | POA: Diagnosis not present

## 2023-10-01 DIAGNOSIS — M62512 Muscle wasting and atrophy, not elsewhere classified, left shoulder: Secondary | ICD-10-CM | POA: Diagnosis not present

## 2023-10-02 DIAGNOSIS — E785 Hyperlipidemia, unspecified: Secondary | ICD-10-CM | POA: Diagnosis not present

## 2023-10-02 DIAGNOSIS — D509 Iron deficiency anemia, unspecified: Secondary | ICD-10-CM | POA: Diagnosis not present

## 2023-10-06 DIAGNOSIS — I480 Paroxysmal atrial fibrillation: Secondary | ICD-10-CM | POA: Diagnosis not present

## 2023-10-06 DIAGNOSIS — F0280 Dementia in other diseases classified elsewhere without behavioral disturbance: Secondary | ICD-10-CM | POA: Diagnosis not present

## 2023-10-06 DIAGNOSIS — R2681 Unsteadiness on feet: Secondary | ICD-10-CM | POA: Diagnosis not present

## 2023-10-06 DIAGNOSIS — L03116 Cellulitis of left lower limb: Secondary | ICD-10-CM | POA: Diagnosis not present

## 2023-10-06 DIAGNOSIS — M62512 Muscle wasting and atrophy, not elsewhere classified, left shoulder: Secondary | ICD-10-CM | POA: Diagnosis not present

## 2023-10-06 DIAGNOSIS — Z8673 Personal history of transient ischemic attack (TIA), and cerebral infarction without residual deficits: Secondary | ICD-10-CM | POA: Diagnosis not present

## 2023-10-06 DIAGNOSIS — F01518 Vascular dementia, unspecified severity, with other behavioral disturbance: Secondary | ICD-10-CM | POA: Diagnosis not present

## 2023-10-06 DIAGNOSIS — I129 Hypertensive chronic kidney disease with stage 1 through stage 4 chronic kidney disease, or unspecified chronic kidney disease: Secondary | ICD-10-CM | POA: Diagnosis not present

## 2023-10-06 DIAGNOSIS — N1832 Chronic kidney disease, stage 3b: Secondary | ICD-10-CM | POA: Diagnosis not present

## 2023-10-06 DIAGNOSIS — Z7901 Long term (current) use of anticoagulants: Secondary | ICD-10-CM | POA: Diagnosis not present

## 2023-10-08 DIAGNOSIS — R2681 Unsteadiness on feet: Secondary | ICD-10-CM | POA: Diagnosis not present

## 2023-10-08 DIAGNOSIS — M62512 Muscle wasting and atrophy, not elsewhere classified, left shoulder: Secondary | ICD-10-CM | POA: Diagnosis not present

## 2023-10-12 DIAGNOSIS — M62512 Muscle wasting and atrophy, not elsewhere classified, left shoulder: Secondary | ICD-10-CM | POA: Diagnosis not present

## 2023-10-12 DIAGNOSIS — R2681 Unsteadiness on feet: Secondary | ICD-10-CM | POA: Diagnosis not present

## 2023-10-15 DIAGNOSIS — R2681 Unsteadiness on feet: Secondary | ICD-10-CM | POA: Diagnosis not present

## 2023-10-15 DIAGNOSIS — Z23 Encounter for immunization: Secondary | ICD-10-CM | POA: Diagnosis not present

## 2023-10-15 DIAGNOSIS — M62512 Muscle wasting and atrophy, not elsewhere classified, left shoulder: Secondary | ICD-10-CM | POA: Diagnosis not present

## 2023-10-21 DIAGNOSIS — M62512 Muscle wasting and atrophy, not elsewhere classified, left shoulder: Secondary | ICD-10-CM | POA: Diagnosis not present

## 2023-10-21 DIAGNOSIS — R2681 Unsteadiness on feet: Secondary | ICD-10-CM | POA: Diagnosis not present

## 2023-10-22 DIAGNOSIS — R2681 Unsteadiness on feet: Secondary | ICD-10-CM | POA: Diagnosis not present

## 2023-10-22 DIAGNOSIS — M62512 Muscle wasting and atrophy, not elsewhere classified, left shoulder: Secondary | ICD-10-CM | POA: Diagnosis not present

## 2023-10-27 DIAGNOSIS — K5901 Slow transit constipation: Secondary | ICD-10-CM | POA: Diagnosis not present

## 2023-10-27 DIAGNOSIS — F0280 Dementia in other diseases classified elsewhere without behavioral disturbance: Secondary | ICD-10-CM | POA: Diagnosis not present

## 2023-10-27 DIAGNOSIS — I129 Hypertensive chronic kidney disease with stage 1 through stage 4 chronic kidney disease, or unspecified chronic kidney disease: Secondary | ICD-10-CM | POA: Diagnosis not present

## 2023-10-27 DIAGNOSIS — F0150 Vascular dementia without behavioral disturbance: Secondary | ICD-10-CM | POA: Diagnosis not present

## 2023-10-27 DIAGNOSIS — Z8673 Personal history of transient ischemic attack (TIA), and cerebral infarction without residual deficits: Secondary | ICD-10-CM | POA: Diagnosis not present

## 2023-10-27 DIAGNOSIS — N1832 Chronic kidney disease, stage 3b: Secondary | ICD-10-CM | POA: Diagnosis not present

## 2023-10-29 DIAGNOSIS — M62512 Muscle wasting and atrophy, not elsewhere classified, left shoulder: Secondary | ICD-10-CM | POA: Diagnosis not present

## 2023-10-29 DIAGNOSIS — R2681 Unsteadiness on feet: Secondary | ICD-10-CM | POA: Diagnosis not present

## 2023-10-30 DIAGNOSIS — M62512 Muscle wasting and atrophy, not elsewhere classified, left shoulder: Secondary | ICD-10-CM | POA: Diagnosis not present

## 2023-10-30 DIAGNOSIS — R2681 Unsteadiness on feet: Secondary | ICD-10-CM | POA: Diagnosis not present

## 2023-11-02 DIAGNOSIS — R2681 Unsteadiness on feet: Secondary | ICD-10-CM | POA: Diagnosis not present

## 2023-11-02 DIAGNOSIS — M62512 Muscle wasting and atrophy, not elsewhere classified, left shoulder: Secondary | ICD-10-CM | POA: Diagnosis not present

## 2023-11-04 DIAGNOSIS — R2681 Unsteadiness on feet: Secondary | ICD-10-CM | POA: Diagnosis not present

## 2023-11-04 DIAGNOSIS — M62512 Muscle wasting and atrophy, not elsewhere classified, left shoulder: Secondary | ICD-10-CM | POA: Diagnosis not present

## 2023-11-09 DIAGNOSIS — R2681 Unsteadiness on feet: Secondary | ICD-10-CM | POA: Diagnosis not present

## 2023-11-09 DIAGNOSIS — M62512 Muscle wasting and atrophy, not elsewhere classified, left shoulder: Secondary | ICD-10-CM | POA: Diagnosis not present

## 2023-11-11 DIAGNOSIS — R2681 Unsteadiness on feet: Secondary | ICD-10-CM | POA: Diagnosis not present

## 2023-11-11 DIAGNOSIS — M62512 Muscle wasting and atrophy, not elsewhere classified, left shoulder: Secondary | ICD-10-CM | POA: Diagnosis not present

## 2023-11-16 DIAGNOSIS — M62512 Muscle wasting and atrophy, not elsewhere classified, left shoulder: Secondary | ICD-10-CM | POA: Diagnosis not present

## 2023-11-16 DIAGNOSIS — R2681 Unsteadiness on feet: Secondary | ICD-10-CM | POA: Diagnosis not present

## 2023-11-18 DIAGNOSIS — M62512 Muscle wasting and atrophy, not elsewhere classified, left shoulder: Secondary | ICD-10-CM | POA: Diagnosis not present

## 2023-11-18 DIAGNOSIS — R2681 Unsteadiness on feet: Secondary | ICD-10-CM | POA: Diagnosis not present

## 2023-11-24 DIAGNOSIS — Z8673 Personal history of transient ischemic attack (TIA), and cerebral infarction without residual deficits: Secondary | ICD-10-CM | POA: Diagnosis not present

## 2023-11-24 DIAGNOSIS — N1832 Chronic kidney disease, stage 3b: Secondary | ICD-10-CM | POA: Diagnosis not present

## 2023-11-24 DIAGNOSIS — I129 Hypertensive chronic kidney disease with stage 1 through stage 4 chronic kidney disease, or unspecified chronic kidney disease: Secondary | ICD-10-CM | POA: Diagnosis not present

## 2023-11-24 DIAGNOSIS — F0150 Vascular dementia without behavioral disturbance: Secondary | ICD-10-CM | POA: Diagnosis not present

## 2023-11-24 DIAGNOSIS — Z7901 Long term (current) use of anticoagulants: Secondary | ICD-10-CM | POA: Diagnosis not present

## 2023-11-24 DIAGNOSIS — I480 Paroxysmal atrial fibrillation: Secondary | ICD-10-CM | POA: Diagnosis not present

## 2023-11-24 DIAGNOSIS — F0280 Dementia in other diseases classified elsewhere without behavioral disturbance: Secondary | ICD-10-CM | POA: Diagnosis not present

## 2023-11-25 DIAGNOSIS — R2681 Unsteadiness on feet: Secondary | ICD-10-CM | POA: Diagnosis not present

## 2023-11-25 DIAGNOSIS — E782 Mixed hyperlipidemia: Secondary | ICD-10-CM | POA: Diagnosis not present

## 2023-11-25 DIAGNOSIS — Z7982 Long term (current) use of aspirin: Secondary | ICD-10-CM | POA: Diagnosis not present

## 2023-11-25 DIAGNOSIS — Z7901 Long term (current) use of anticoagulants: Secondary | ICD-10-CM | POA: Diagnosis not present

## 2023-11-25 DIAGNOSIS — I480 Paroxysmal atrial fibrillation: Secondary | ICD-10-CM | POA: Diagnosis not present

## 2023-11-25 DIAGNOSIS — R600 Localized edema: Secondary | ICD-10-CM | POA: Diagnosis not present

## 2023-11-25 DIAGNOSIS — N1832 Chronic kidney disease, stage 3b: Secondary | ICD-10-CM | POA: Diagnosis not present

## 2023-11-25 DIAGNOSIS — M62512 Muscle wasting and atrophy, not elsewhere classified, left shoulder: Secondary | ICD-10-CM | POA: Diagnosis not present

## 2023-11-25 DIAGNOSIS — R011 Cardiac murmur, unspecified: Secondary | ICD-10-CM | POA: Diagnosis not present

## 2023-11-27 DIAGNOSIS — M62512 Muscle wasting and atrophy, not elsewhere classified, left shoulder: Secondary | ICD-10-CM | POA: Diagnosis not present

## 2023-11-27 DIAGNOSIS — R2681 Unsteadiness on feet: Secondary | ICD-10-CM | POA: Diagnosis not present

## 2023-11-30 DIAGNOSIS — R2681 Unsteadiness on feet: Secondary | ICD-10-CM | POA: Diagnosis not present

## 2023-11-30 DIAGNOSIS — M62512 Muscle wasting and atrophy, not elsewhere classified, left shoulder: Secondary | ICD-10-CM | POA: Diagnosis not present

## 2023-12-01 DIAGNOSIS — E785 Hyperlipidemia, unspecified: Secondary | ICD-10-CM | POA: Diagnosis not present

## 2023-12-01 DIAGNOSIS — I10 Essential (primary) hypertension: Secondary | ICD-10-CM | POA: Diagnosis not present

## 2023-12-02 DIAGNOSIS — M62512 Muscle wasting and atrophy, not elsewhere classified, left shoulder: Secondary | ICD-10-CM | POA: Diagnosis not present

## 2023-12-02 DIAGNOSIS — R2681 Unsteadiness on feet: Secondary | ICD-10-CM | POA: Diagnosis not present

## 2023-12-04 DIAGNOSIS — M62512 Muscle wasting and atrophy, not elsewhere classified, left shoulder: Secondary | ICD-10-CM | POA: Diagnosis not present

## 2023-12-04 DIAGNOSIS — R2681 Unsteadiness on feet: Secondary | ICD-10-CM | POA: Diagnosis not present

## 2023-12-08 DIAGNOSIS — R2681 Unsteadiness on feet: Secondary | ICD-10-CM | POA: Diagnosis not present

## 2023-12-08 DIAGNOSIS — M62512 Muscle wasting and atrophy, not elsewhere classified, left shoulder: Secondary | ICD-10-CM | POA: Diagnosis not present

## 2023-12-10 DIAGNOSIS — R2681 Unsteadiness on feet: Secondary | ICD-10-CM | POA: Diagnosis not present

## 2023-12-10 DIAGNOSIS — M62512 Muscle wasting and atrophy, not elsewhere classified, left shoulder: Secondary | ICD-10-CM | POA: Diagnosis not present

## 2023-12-11 DIAGNOSIS — M62512 Muscle wasting and atrophy, not elsewhere classified, left shoulder: Secondary | ICD-10-CM | POA: Diagnosis not present

## 2023-12-11 DIAGNOSIS — R2681 Unsteadiness on feet: Secondary | ICD-10-CM | POA: Diagnosis not present

## 2023-12-22 DIAGNOSIS — K5901 Slow transit constipation: Secondary | ICD-10-CM | POA: Diagnosis not present

## 2023-12-22 DIAGNOSIS — F0280 Dementia in other diseases classified elsewhere without behavioral disturbance: Secondary | ICD-10-CM | POA: Diagnosis not present

## 2023-12-22 DIAGNOSIS — N1832 Chronic kidney disease, stage 3b: Secondary | ICD-10-CM | POA: Diagnosis not present

## 2023-12-22 DIAGNOSIS — F0150 Vascular dementia without behavioral disturbance: Secondary | ICD-10-CM | POA: Diagnosis not present

## 2023-12-22 DIAGNOSIS — I129 Hypertensive chronic kidney disease with stage 1 through stage 4 chronic kidney disease, or unspecified chronic kidney disease: Secondary | ICD-10-CM | POA: Diagnosis not present

## 2024-01-18 ENCOUNTER — Encounter (HOSPITAL_COMMUNITY): Payer: Self-pay | Admitting: Emergency Medicine

## 2024-01-18 ENCOUNTER — Inpatient Hospital Stay (HOSPITAL_COMMUNITY)
Admission: EM | Admit: 2024-01-18 | Discharge: 2024-01-26 | DRG: 480 | Disposition: A | Discharge status: Skilled Nursing Facility | Payer: Medicare Other | Source: Skilled Nursing Facility | Attending: Student | Admitting: Student

## 2024-01-18 ENCOUNTER — Other Ambulatory Visit: Payer: Self-pay

## 2024-01-18 ENCOUNTER — Emergency Department (HOSPITAL_COMMUNITY): Payer: Medicare Other

## 2024-01-18 DIAGNOSIS — F0390 Unspecified dementia without behavioral disturbance: Secondary | ICD-10-CM | POA: Diagnosis present

## 2024-01-18 DIAGNOSIS — S72141A Displaced intertrochanteric fracture of right femur, initial encounter for closed fracture: Principal | ICD-10-CM | POA: Diagnosis present

## 2024-01-18 DIAGNOSIS — D649 Anemia, unspecified: Secondary | ICD-10-CM | POA: Diagnosis present

## 2024-01-18 DIAGNOSIS — S7291XA Unspecified fracture of right femur, initial encounter for closed fracture: Secondary | ICD-10-CM | POA: Diagnosis present

## 2024-01-18 DIAGNOSIS — S72001A Fracture of unspecified part of neck of right femur, initial encounter for closed fracture: Secondary | ICD-10-CM

## 2024-01-18 DIAGNOSIS — F05 Delirium due to known physiological condition: Secondary | ICD-10-CM | POA: Diagnosis not present

## 2024-01-18 DIAGNOSIS — W19XXXA Unspecified fall, initial encounter: Principal | ICD-10-CM

## 2024-01-18 DIAGNOSIS — F03918 Unspecified dementia, unspecified severity, with other behavioral disturbance: Secondary | ICD-10-CM | POA: Diagnosis present

## 2024-01-18 DIAGNOSIS — Z8249 Family history of ischemic heart disease and other diseases of the circulatory system: Secondary | ICD-10-CM

## 2024-01-18 DIAGNOSIS — Z8673 Personal history of transient ischemic attack (TIA), and cerebral infarction without residual deficits: Secondary | ICD-10-CM

## 2024-01-18 DIAGNOSIS — I639 Cerebral infarction, unspecified: Secondary | ICD-10-CM | POA: Diagnosis present

## 2024-01-18 DIAGNOSIS — Z853 Personal history of malignant neoplasm of breast: Secondary | ICD-10-CM

## 2024-01-18 DIAGNOSIS — S72009A Fracture of unspecified part of neck of unspecified femur, initial encounter for closed fracture: Secondary | ICD-10-CM | POA: Diagnosis not present

## 2024-01-18 DIAGNOSIS — Z7982 Long term (current) use of aspirin: Secondary | ICD-10-CM

## 2024-01-18 DIAGNOSIS — N1832 Chronic kidney disease, stage 3b: Secondary | ICD-10-CM | POA: Diagnosis present

## 2024-01-18 DIAGNOSIS — I480 Paroxysmal atrial fibrillation: Secondary | ICD-10-CM | POA: Diagnosis present

## 2024-01-18 DIAGNOSIS — Z888 Allergy status to other drugs, medicaments and biological substances status: Secondary | ICD-10-CM

## 2024-01-18 DIAGNOSIS — I2510 Atherosclerotic heart disease of native coronary artery without angina pectoris: Secondary | ICD-10-CM | POA: Diagnosis present

## 2024-01-18 DIAGNOSIS — I5022 Chronic systolic (congestive) heart failure: Secondary | ICD-10-CM | POA: Diagnosis present

## 2024-01-18 DIAGNOSIS — I2584 Coronary atherosclerosis due to calcified coronary lesion: Secondary | ICD-10-CM | POA: Diagnosis present

## 2024-01-18 DIAGNOSIS — I959 Hypotension, unspecified: Secondary | ICD-10-CM | POA: Diagnosis not present

## 2024-01-18 DIAGNOSIS — G9341 Metabolic encephalopathy: Secondary | ICD-10-CM | POA: Diagnosis present

## 2024-01-18 DIAGNOSIS — G4700 Insomnia, unspecified: Secondary | ICD-10-CM | POA: Diagnosis present

## 2024-01-18 DIAGNOSIS — I2721 Secondary pulmonary arterial hypertension: Secondary | ICD-10-CM | POA: Diagnosis present

## 2024-01-18 DIAGNOSIS — E7800 Pure hypercholesterolemia, unspecified: Secondary | ICD-10-CM | POA: Diagnosis present

## 2024-01-18 DIAGNOSIS — Z9012 Acquired absence of left breast and nipple: Secondary | ICD-10-CM

## 2024-01-18 DIAGNOSIS — I272 Pulmonary hypertension, unspecified: Secondary | ICD-10-CM | POA: Insufficient documentation

## 2024-01-18 DIAGNOSIS — Y92099 Unspecified place in other non-institutional residence as the place of occurrence of the external cause: Secondary | ICD-10-CM

## 2024-01-18 DIAGNOSIS — Z955 Presence of coronary angioplasty implant and graft: Secondary | ICD-10-CM

## 2024-01-18 DIAGNOSIS — Z79899 Other long term (current) drug therapy: Secondary | ICD-10-CM

## 2024-01-18 DIAGNOSIS — Z66 Do not resuscitate: Secondary | ICD-10-CM | POA: Diagnosis present

## 2024-01-18 DIAGNOSIS — I350 Nonrheumatic aortic (valve) stenosis: Secondary | ICD-10-CM | POA: Diagnosis present

## 2024-01-18 DIAGNOSIS — Z86718 Personal history of other venous thrombosis and embolism: Secondary | ICD-10-CM

## 2024-01-18 DIAGNOSIS — W1830XA Fall on same level, unspecified, initial encounter: Secondary | ICD-10-CM | POA: Diagnosis present

## 2024-01-18 DIAGNOSIS — Z7901 Long term (current) use of anticoagulants: Secondary | ICD-10-CM

## 2024-01-18 DIAGNOSIS — Z87891 Personal history of nicotine dependence: Secondary | ICD-10-CM

## 2024-01-18 MED ORDER — HYDROMORPHONE HCL 1 MG/ML IJ SOLN
0.5000 mg | Freq: Once | INTRAMUSCULAR | Status: AC
  Administered 2024-01-19: 0.5 mg via INTRAVENOUS
  Filled 2024-01-18: qty 1

## 2024-01-18 NOTE — Progress Notes (Signed)
   01/18/24 2130  Spiritual Encounters  Type of Visit Initial  Care provided to: Pt not available  Referral source Trauma page  Reason for visit Trauma  OnCall Visit No   Chaplain responded to a level two trauma. The patient was attended to by the medical team. No family is present. If a chaplain is requested someone will respond.   Valerie Roys [facility]  [phone]

## 2024-01-18 NOTE — ED Triage Notes (Addendum)
BIB EMS from assisted living after fall. Unsure if hit head. Reporting right leg pain. Upon EMS arrival 89% on RA. Typically does not require oxygen. Currently on 2 L [place] satting 93%. Denies chest pain and shortness of breath.

## 2024-01-18 NOTE — Progress Notes (Signed)
Orthopedic Tech Progress Note Patient Details:  Leslie Carr 05-12-1931 [id number]  Patient ID: [name], female   DOB: 1931-04-13, 89 y.o.   MRN: [id number] I attended trauma page. Trinna Post 01/18/2024, 10:02 PM

## 2024-01-18 NOTE — ED Provider Notes (Signed)
[place] EMERGENCY DEPARTMENT AT [facility] Provider Note   CSN: [id number] Arrival date & time: 01/18/24  2133     History Chief Complaint  Patient presents with   Fall    HPI Leslie Carr is a 89 y.o. female presenting for ground-level fall from a skilled nursing facility.  89 year old female on anticoagulation.  She does not remember the event to the fall but per staff she hit her head.  She denies any pain at this time.  Is able to follow commands states that nothing hurts.  Was ambulatory after the event she reports..   Patient's recorded medical, surgical, social, medication list and allergies were reviewed in the Snapshot window as part of the initial history.   Review of Systems   Review of Systems  Constitutional:  Negative for chills and fever.  HENT:  Negative for ear pain and sore throat.   Eyes:  Negative for pain and visual disturbance.  Respiratory:  Negative for cough and shortness of breath.   Cardiovascular:  Negative for chest pain and palpitations.  Gastrointestinal:  Negative for abdominal pain and vomiting.  Genitourinary:  Negative for dysuria and hematuria.  Musculoskeletal:  Negative for arthralgias and back pain.  Skin:  Negative for color change and rash.  Neurological:  Negative for seizures and syncope.  All other systems reviewed and are negative.   Physical Exam Updated Vital Signs BP (!) 137/90 (BP Location: Right Arm)   Pulse (!) 117   Temp 98.3 F (36.8 C) (Oral)   Resp (!) 22   SpO2 93%  Physical Exam Vitals and nursing note reviewed.  Constitutional:      General: She is not in acute distress.    Appearance: She is well-developed.  HENT:     Head: Normocephalic and atraumatic.  Eyes:     Conjunctiva/sclera: Conjunctivae normal.  Cardiovascular:     Rate and Rhythm: Normal rate and regular rhythm.     Heart sounds: No murmur heard. Pulmonary:     Effort: Pulmonary effort is normal. No respiratory distress.      Breath sounds: Normal breath sounds.  Abdominal:     General: There is no distension.     Palpations: Abdomen is soft.     Tenderness: There is no abdominal tenderness. There is no right CVA tenderness or left CVA tenderness.  Musculoskeletal:        General: No swelling or tenderness. Normal range of motion.     Cervical back: Neck supple.  Skin:    General: Skin is warm and dry.  Neurological:     General: No focal deficit present.     Mental Status: She is alert and oriented to person, place, and time. Mental status is at baseline.     Cranial Nerves: No cranial nerve deficit.      ED Course/ Medical Decision Making/ A&P Clinical Course as of 01/18/24 2302  Mon Jan 18, 2024  2301 DG Hip Unilat W or Wo Pelvis 2-3 Views Right [CC]    Clinical Course User Index [CC] Leslie Ade, MD    Procedures Procedures   Medications Ordered in ED Medications - No data to display Medical Decision Making:   Leslie Carr is a 89 y.o. female who presented to the ED today with a fall at their living facility detailed above. They are on a blood thinner. Handoff received from EMS.  Patient placed on continuous vitals and telemetry monitoring while in ED which was reviewed  periodically.   Complete initial physical exam performed, notably the patient  was hemodynamically stable  in no acute distress. No obvious deformities or injuries appreciated on extensive physical exam including active range of motion of all joints.     Reviewed and confirmed nursing documentation for past medical history, family history, social history.    Initial Assessment/Plan:   This is a patient presenting with a moderate blunt mechanism trauma.  As such, I have considered intracranial injuries including intracranial hemorrhage, intrathoracic injuries including blunt myocardial or blunt lung injury, blunt abdominal injuries including aortic dissection, bladder injury, spleen injury, liver injury and I have  considered orthopedic injuries including extremity or spinal injury. This is most consistent with an acute life/limb threatening illness complicated by underlying chronic conditions.  With the patient's presentation of moderate mechanism trauma but an otherwise reassuring exam, patient warrants targeted evaluation for potential traumatic injuries. Will proceed with targeted evaluation for potential injuries. Will proceed with CT Head, Cervical Spine CT, and Pelvis XR.   Images reviewed and agree with radiology interpretation.  DG Chest Portable 1 View Result Date: 01/18/2024 CLINICAL DATA:  Status post fall with right hip pain. EXAM: PORTABLE CHEST 1 VIEW COMPARISON:  Radiograph in CT 03/26/2023 FINDINGS: Stable heart size and mediastinal contours. Aortic atherosclerosis. Assessment of the left mid lower lung zone is limited due to overlying artifact, possibly related to patient's bra. There is vascular congestion without overt edema. The right middle lobe partially calcified mass on prior CT is only faintly visualized by radiograph, but grossly stable. No pneumothorax or large pleural effusion. IMPRESSION: 1. Left lung assessment is limited due to overlying artifact. 2. Vascular congestion without edema.  Stable prominent heart size. 3. Chronic chronic partially calcified right middle lobe lesion is faintly visualized. Electronically Signed   By: Narda Rutherford M.D.   On: 01/18/2024 22:57   DG Hip Unilat W or Wo Pelvis 2-3 Views Right Result Date: 01/18/2024 CLINICAL DATA:  Fall EXAM: DG HIP (WITH OR WITHOUT PELVIS) 2-3V RIGHT COMPARISON:  CT chest abdomen and pelvis 03/26/2023. FINDINGS: The right femoral neck appears slightly foreshortened and a subtle impacted fracture can not be excluded. No discrete fracture line visualized. There is no dislocation. Joint spaces are maintained. There are vascular calcifications in the lower extremities and pelvis. IMPRESSION: The right femoral neck appears slightly  foreshortened and a subtle impacted fracture can not be excluded. Recommend further evaluation with CT. Electronically Signed   By: Darliss Cheney M.D.   On: 01/18/2024 22:53    Care patient handed off to oncoming provider pending CT results.    Clinical Impression:  1. Fall, initial encounter      Data Unavailable   Final Clinical Impression(s) / ED Diagnoses Final diagnoses:  Fall, initial encounter    Rx / DC Orders ED Discharge Orders     None         Leslie Ade, MD 01/18/24 2302

## 2024-01-19 ENCOUNTER — Inpatient Hospital Stay (HOSPITAL_COMMUNITY): Payer: Medicare Other | Admitting: Anesthesiology

## 2024-01-19 ENCOUNTER — Emergency Department (HOSPITAL_COMMUNITY): Payer: Medicare Other

## 2024-01-19 DIAGNOSIS — S72141A Displaced intertrochanteric fracture of right femur, initial encounter for closed fracture: Secondary | ICD-10-CM | POA: Diagnosis present

## 2024-01-19 DIAGNOSIS — Z7189 Other specified counseling: Secondary | ICD-10-CM | POA: Diagnosis not present

## 2024-01-19 DIAGNOSIS — Z955 Presence of coronary angioplasty implant and graft: Secondary | ICD-10-CM | POA: Diagnosis not present

## 2024-01-19 DIAGNOSIS — Z8673 Personal history of transient ischemic attack (TIA), and cerebral infarction without residual deficits: Secondary | ICD-10-CM | POA: Diagnosis not present

## 2024-01-19 DIAGNOSIS — S72001D Fracture of unspecified part of neck of right femur, subsequent encounter for closed fracture with routine healing: Secondary | ICD-10-CM | POA: Diagnosis not present

## 2024-01-19 DIAGNOSIS — I350 Nonrheumatic aortic (valve) stenosis: Secondary | ICD-10-CM | POA: Diagnosis present

## 2024-01-19 DIAGNOSIS — I5022 Chronic systolic (congestive) heart failure: Secondary | ICD-10-CM | POA: Diagnosis present

## 2024-01-19 DIAGNOSIS — Z86718 Personal history of other venous thrombosis and embolism: Secondary | ICD-10-CM | POA: Diagnosis not present

## 2024-01-19 DIAGNOSIS — I959 Hypotension, unspecified: Secondary | ICD-10-CM | POA: Diagnosis not present

## 2024-01-19 DIAGNOSIS — S72009A Fracture of unspecified part of neck of unspecified femur, initial encounter for closed fracture: Secondary | ICD-10-CM | POA: Diagnosis present

## 2024-01-19 DIAGNOSIS — S7291XA Unspecified fracture of right femur, initial encounter for closed fracture: Secondary | ICD-10-CM | POA: Diagnosis present

## 2024-01-19 DIAGNOSIS — E7800 Pure hypercholesterolemia, unspecified: Secondary | ICD-10-CM | POA: Diagnosis present

## 2024-01-19 DIAGNOSIS — E785 Hyperlipidemia, unspecified: Secondary | ICD-10-CM | POA: Diagnosis not present

## 2024-01-19 DIAGNOSIS — I2721 Secondary pulmonary arterial hypertension: Secondary | ICD-10-CM | POA: Diagnosis present

## 2024-01-19 DIAGNOSIS — F0390 Unspecified dementia without behavioral disturbance: Secondary | ICD-10-CM | POA: Diagnosis not present

## 2024-01-19 DIAGNOSIS — I2584 Coronary atherosclerosis due to calcified coronary lesion: Secondary | ICD-10-CM | POA: Diagnosis present

## 2024-01-19 DIAGNOSIS — F03918 Unspecified dementia, unspecified severity, with other behavioral disturbance: Secondary | ICD-10-CM | POA: Diagnosis present

## 2024-01-19 DIAGNOSIS — Z888 Allergy status to other drugs, medicaments and biological substances status: Secondary | ICD-10-CM | POA: Diagnosis not present

## 2024-01-19 DIAGNOSIS — Z66 Do not resuscitate: Secondary | ICD-10-CM | POA: Diagnosis present

## 2024-01-19 DIAGNOSIS — I2510 Atherosclerotic heart disease of native coronary artery without angina pectoris: Secondary | ICD-10-CM | POA: Diagnosis present

## 2024-01-19 DIAGNOSIS — I63019 Cerebral infarction due to thrombosis of unspecified vertebral artery: Secondary | ICD-10-CM | POA: Diagnosis not present

## 2024-01-19 DIAGNOSIS — I509 Heart failure, unspecified: Secondary | ICD-10-CM | POA: Diagnosis not present

## 2024-01-19 DIAGNOSIS — G4700 Insomnia, unspecified: Secondary | ICD-10-CM | POA: Diagnosis present

## 2024-01-19 DIAGNOSIS — Z7901 Long term (current) use of anticoagulants: Secondary | ICD-10-CM | POA: Diagnosis not present

## 2024-01-19 DIAGNOSIS — D649 Anemia, unspecified: Secondary | ICD-10-CM | POA: Diagnosis present

## 2024-01-19 DIAGNOSIS — S72001A Fracture of unspecified part of neck of right femur, initial encounter for closed fracture: Secondary | ICD-10-CM | POA: Diagnosis not present

## 2024-01-19 DIAGNOSIS — Z8249 Family history of ischemic heart disease and other diseases of the circulatory system: Secondary | ICD-10-CM | POA: Diagnosis not present

## 2024-01-19 DIAGNOSIS — N1832 Chronic kidney disease, stage 3b: Secondary | ICD-10-CM | POA: Diagnosis present

## 2024-01-19 DIAGNOSIS — F05 Delirium due to known physiological condition: Secondary | ICD-10-CM | POA: Diagnosis not present

## 2024-01-19 DIAGNOSIS — Y92099 Unspecified place in other non-institutional residence as the place of occurrence of the external cause: Secondary | ICD-10-CM | POA: Diagnosis not present

## 2024-01-19 DIAGNOSIS — W1830XA Fall on same level, unspecified, initial encounter: Secondary | ICD-10-CM | POA: Diagnosis present

## 2024-01-19 DIAGNOSIS — G9341 Metabolic encephalopathy: Secondary | ICD-10-CM | POA: Diagnosis present

## 2024-01-19 DIAGNOSIS — Z9012 Acquired absence of left breast and nipple: Secondary | ICD-10-CM | POA: Diagnosis not present

## 2024-01-19 DIAGNOSIS — Z87891 Personal history of nicotine dependence: Secondary | ICD-10-CM | POA: Diagnosis not present

## 2024-01-19 DIAGNOSIS — I480 Paroxysmal atrial fibrillation: Secondary | ICD-10-CM | POA: Diagnosis present

## 2024-01-19 DIAGNOSIS — I272 Pulmonary hypertension, unspecified: Secondary | ICD-10-CM | POA: Diagnosis not present

## 2024-01-19 LAB — BASIC METABOLIC PANEL WITH GFR
Anion gap: 11 (ref 5–15)
BUN: 19 mg/dL (ref 8–23)
CO2: 21 mmol/L — ABNORMAL LOW (ref 22–32)
Calcium: 8.4 mg/dL — ABNORMAL LOW (ref 8.9–10.3)
Chloride: 105 mmol/L (ref 98–111)
Creatinine, Ser: 1.41 mg/dL — ABNORMAL HIGH (ref 0.44–1.00)
GFR, Estimated: 35 mL/min — ABNORMAL LOW
Glucose, Bld: 135 mg/dL — ABNORMAL HIGH (ref 70–99)
Potassium: 4 mmol/L (ref 3.5–5.1)
Sodium: 137 mmol/L (ref 135–145)

## 2024-01-19 LAB — CBC WITH DIFFERENTIAL/PLATELET
Abs Immature Granulocytes: 0.08 K/uL — ABNORMAL HIGH (ref 0.00–0.07)
Basophils Absolute: 0 K/uL (ref 0.0–0.1)
Basophils Relative: 0 %
Eosinophils Absolute: 0.1 K/uL (ref 0.0–0.5)
Eosinophils Relative: 1 %
HCT: 36.5 % (ref 36.0–46.0)
Hemoglobin: 11.2 g/dL — ABNORMAL LOW (ref 12.0–15.0)
Immature Granulocytes: 1 %
Lymphocytes Relative: 3 %
Lymphs Abs: 0.3 K/uL — ABNORMAL LOW (ref 0.7–4.0)
MCH: 26.6 pg (ref 26.0–34.0)
MCHC: 30.7 g/dL (ref 30.0–36.0)
MCV: 86.7 fL (ref 80.0–100.0)
Monocytes Absolute: 0.9 K/uL (ref 0.1–1.0)
Monocytes Relative: 7 %
Neutro Abs: 11.6 K/uL — ABNORMAL HIGH (ref 1.7–7.7)
Neutrophils Relative %: 88 %
Platelets: 245 K/uL (ref 150–400)
RBC: 4.21 MIL/uL (ref 3.87–5.11)
RDW: 16.1 % — ABNORMAL HIGH (ref 11.5–15.5)
WBC: 13 K/uL — ABNORMAL HIGH (ref 4.0–10.5)
nRBC: 0 % (ref 0.0–0.2)

## 2024-01-19 LAB — BASIC METABOLIC PANEL
Anion gap: 13 (ref 5–15)
BUN: 19 mg/dL (ref 8–23)
CO2: 19 mmol/L — ABNORMAL LOW (ref 22–32)
Calcium: 8.2 mg/dL — ABNORMAL LOW (ref 8.9–10.3)
Chloride: 104 mmol/L (ref 98–111)
Creatinine, Ser: 1.42 mg/dL — ABNORMAL HIGH (ref 0.44–1.00)
GFR, Estimated: 35 mL/min — ABNORMAL LOW (ref >60)
Glucose, Bld: 176 mg/dL — ABNORMAL HIGH (ref 70–99)
Potassium: 4.3 mmol/L (ref 3.5–5.1)
Sodium: 136 mmol/L (ref 135–145)

## 2024-01-19 LAB — CBC
HCT: 32.2 % — ABNORMAL LOW (ref 36.0–46.0)
Hemoglobin: 10 g/dL — ABNORMAL LOW (ref 12.0–15.0)
MCH: 26.6 pg (ref 26.0–34.0)
MCHC: 31.1 g/dL (ref 30.0–36.0)
MCV: 85.6 fL (ref 80.0–100.0)
Platelets: 234 10*3/uL (ref 150–400)
RBC: 3.76 MIL/uL — ABNORMAL LOW (ref 3.87–5.11)
RDW: 15.9 % — ABNORMAL HIGH (ref 11.5–15.5)
WBC: 11.4 10*3/uL — ABNORMAL HIGH (ref 4.0–10.5)
nRBC: 0 % (ref 0.0–0.2)

## 2024-01-19 LAB — TROPONIN I (HIGH SENSITIVITY)
Troponin I (High Sensitivity): 23 ng/L — ABNORMAL HIGH (ref <18)
Troponin I (High Sensitivity): 25 ng/L — ABNORMAL HIGH (ref <18)

## 2024-01-19 MED ORDER — ACETAMINOPHEN 325 MG PO TABS
650.0000 mg | ORAL_TABLET | Freq: Four times a day (QID) | ORAL | Status: DC | PRN
  Administered 2024-01-21 (×3): 650 mg via ORAL
  Filled 2024-01-19 (×4): qty 2

## 2024-01-19 MED ORDER — ACETAMINOPHEN 650 MG RE SUPP: 650.0000 mg | Freq: Four times a day (QID) | RECTAL | Status: DC | PRN

## 2024-01-19 MED ORDER — TRANEXAMIC ACID-NACL 1000-0.7 MG/100ML-% IV SOLN
1000.0000 mg | INTRAVENOUS | Status: AC
  Administered 2024-01-20: 1000 mg via INTRAVENOUS
  Filled 2024-01-19: qty 100

## 2024-01-19 MED ORDER — ONDANSETRON HCL 4 MG PO TABS: 4.0000 mg | ORAL_TABLET | Freq: Four times a day (QID) | ORAL | Status: DC | PRN

## 2024-01-19 MED ORDER — FENTANYL CITRATE PF 50 MCG/ML IJ SOSY
12.5000 ug | PREFILLED_SYRINGE | INTRAMUSCULAR | Status: DC | PRN
  Administered 2024-01-19 (×2): 50 ug via INTRAVENOUS
  Filled 2024-01-19 (×2): qty 1

## 2024-01-19 MED ORDER — CHLORHEXIDINE GLUCONATE CLOTH 2 % EX PADS
6.0000 | MEDICATED_PAD | Freq: Every day | CUTANEOUS | Status: DC
  Administered 2024-01-20 – 2024-01-26 (×7): 6 via TOPICAL

## 2024-01-19 MED ORDER — DEXAMETHASONE SODIUM PHOSPHATE 4 MG/ML IJ SOLN
INTRAMUSCULAR | Status: DC | PRN
  Administered 2024-01-19: 10 mg via PERINEURAL

## 2024-01-19 MED ORDER — ROPIVACAINE HCL 5 MG/ML IJ SOLN
INTRAMUSCULAR | Status: DC | PRN
  Administered 2024-01-19: 30 mL via PERINEURAL

## 2024-01-19 MED ORDER — ONDANSETRON HCL 4 MG/2ML IJ SOLN
4.0000 mg | Freq: Four times a day (QID) | INTRAMUSCULAR | Status: DC | PRN
  Administered 2024-01-20: 4 mg via INTRAVENOUS

## 2024-01-19 MED ORDER — CHLORHEXIDINE GLUCONATE 4 % EX SOLN
60.0000 mL | Freq: Once | CUTANEOUS | Status: DC
Start: 2024-01-19 — End: 2024-01-20

## 2024-01-19 MED ORDER — POVIDONE-IODINE 10 % EX SWAB
2.0000 | Freq: Once | CUTANEOUS | Status: DC
Start: 2024-01-19 — End: 2024-01-20

## 2024-01-19 MED ORDER — CEFAZOLIN SODIUM-DEXTROSE 2-4 GM/100ML-% IV SOLN
2.0000 g | INTRAVENOUS | Status: AC
  Administered 2024-01-20: 2 g via INTRAVENOUS
  Filled 2024-01-19: qty 100

## 2024-01-19 MED ORDER — HALOPERIDOL LACTATE 5 MG/ML IJ SOLN
5.0000 mg | Freq: Once | INTRAMUSCULAR | Status: AC
  Administered 2024-01-19: 5 mg via INTRAVENOUS
  Filled 2024-01-19: qty 1

## 2024-01-19 MED ORDER — OXYCODONE HCL 5 MG PO TABS
5.0000 mg | ORAL_TABLET | ORAL | Status: DC | PRN
  Administered 2024-01-19 – 2024-01-21 (×3): 5 mg via ORAL
  Filled 2024-01-19 (×3): qty 1

## 2024-01-19 MED ORDER — PRAVASTATIN SODIUM 40 MG PO TABS
40.0000 mg | ORAL_TABLET | Freq: Every day | ORAL | Status: DC
  Administered 2024-01-19 – 2024-01-26 (×7): 40 mg via ORAL
  Filled 2024-01-19 (×7): qty 1

## 2024-01-19 MED ORDER — MIRTAZAPINE 15 MG PO TABS
7.5000 mg | ORAL_TABLET | Freq: Every day | ORAL | Status: DC
  Administered 2024-01-19 – 2024-01-25 (×7): 7.5 mg via ORAL
  Filled 2024-01-19 (×7): qty 1

## 2024-01-19 NOTE — ED Provider Notes (Signed)
Care assumed from Dr. Doran Durand.  Patient here with ground-level fall striking head on anticoagulation.  Also with right hip pain.  CT scan confirms right intertrochanteric hip fracture.  CT head and C-spine are negative.  Discussed with orthopedics Dr. Odis Hollingshead.  Will arrange for surgical repair likely later today.  Hold Eliquis.  Tachycardic.  EKG shows ST depressions inferiorly laterally.  Will trend troponin. hemoGlobin is stable.  Creatinine is stable.  Admission discussed with Dr. Rennis Petty, MD 01/19/24 [phone]

## 2024-01-19 NOTE — Consult Note (Signed)
 Reason for Consult:Right hip fx Referring Physician: Ava Swayze Time called: 1308 Time at bedside: 0920   Leslie Carr is an 89 y.o. female.  HPI: Leslie Carr fell at the [facility] where [facility]. She had immediate right hip pain and could not get up. She was brought to the ED where x-rays showed a right hip fx and orthopedic surgery was consulted. She is confused this morning and cannot contribute meaningfully to history.  Past Medical History:  Diagnosis Date   Acute metabolic encephalopathy 02/04/2020   Breast cancer (HCC)    CHF (congestive heart failure) (HCC)    Cognitive impairment    Coronary artery disease due to calcified coronary lesion 09/2018   ACS PCI of OM2 on 09/21/2018 followed by staged orbital atherectomy based PCI of ostial to distal RCA.  (Details in Surgical History)   Diastolic dysfunction with chronic heart failure (HCC)    History of bronchitis    Hypercholesteremia    Mass of middle lobe of right lung    Mild aortic stenosis by prior echocardiogram 09/2018   Mean gradient 14 million mercury, peak 24 mmHg   Numbness and tingling in left hand    Pleural effusion on left    Shortness of breath dyspnea    "only going up a flight of stairs"   Stroke [facility]) 2014   left sided weakness - mainly left hand   TIA (transient ischemic attack) 01/2020    Past Surgical History:  Procedure Laterality Date   ABDOMINAL HYSTERECTOMY  1976   CATARACT EXTRACTION Right    COLONOSCOPY     CORONARY ANGIOPLASTY WITH STENT PLACEMENT     CORONARY ATHERECTOMY N/A 09/24/2018   Procedure: CORONARY ATHERECTOMY-WITH CORONARY STENT PLACEMENT;  Surgeon: Marykay Lex, MD;  Location: [facility] INVASIVE CV LAB;; staged PCI: Ostial RCA 70%, prox-mid RCA 70% and mid RCA 95 and 50%. ->  Orbital atherectomy followed by 3 overlapping DES stents (proximal to distal): Xience Sierra 2.75 mm a 28 mm (2.9 mm), 2.5 mm x 33 mm (2.8 mm), 2.25 mm a 12 mm (tapered from 2.6 to 2.4 mm)   CORONARY STENT  INTERVENTION N/A 09/21/2018   Procedure: CORONARY STENT INTERVENTION;  Surgeon: Marykay Lex, MD;  Location: [facility] INVASIVE CV LAB;  Service: Cardiovascular;; Ostial OM2 85% (DES PCI, Xience Sierra 2.5 mm x 18 mm-minimal residual stenosis)   DECORTICATION Left 04/17/2016   Procedure: Visceral Pleural DECORTICATION;  Surgeon: Loreli Slot, MD;  [facility] OR;  Service: Thoracic;  Laterality: Left;   ESOPHAGOGASTRODUODENOSCOPY N/A 04/20/2019   Procedure: ESOPHAGOGASTRODUODENOSCOPY (EGD);  Surgeon: Malissa Hippo, MD;  [facility] ENDO [facility];  Service: Endoscopy;  Laterality: N/A;  APC- circumferential   FOOT SURGERY Right    "bone spur removal"   LEFT HEART CATH AND CORONARY ANGIOGRAPHY N/A 09/21/2018   Procedure: LEFT HEART CATH AND CORONARY ANGIOGRAPHY;  Surgeon: Marykay Lex, MD;  Location: [facility] INVASIVE CV LAB;  Service: Cardiovascular;; Ostial OM2 85% (DES PCI), dLCx 45%.  Lesion #2-3: Ostial RCA 70%, prox-mid RCA 70% and mid RCA 95 and 50%.  Distal RCA 30%, ostial RPDA 30%.  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  GR 1 DD.  Mild aortic s stenosis (mean gradient 14 mmHg, peak gradient 24 mmHg).  Mild LA dilation.  Lipomatous hypertrophy of intra-atrial septum.;; b) February 2021: EF 60 to 65%.  No R WMA.  Mild concentric LVH.  GR 1 DD.  Normal RV function.  Mild LA dilation.  Mild calcific aortic stenosis (mean gradient 10 mmHg-but visually appears to be worse).  No change   VIDEO ASSISTED THORACOSCOPY Left 04/17/2016    Procedure:  Left VIDEO ASSISTED THORACOSCOPY with Drainage of Pleural Effusion, Pleural Biopsy, Visceral Pleural Decortication and Placement of On-Q pain pump;  Surgeon: Loreli Slot, MD;  Location: [facility] OR;  Service: Thoracic;  Laterality: Left;    Family History  Problem Relation Age of Onset   Hypertension Mother    Hypertension Father    Heart disease Neg Hx    Heart attack Neg Hx    Hyperlipidemia Neg Hx    Heart failure Neg Hx     Social History:  reports that she has quit smoking. She has never used smokeless tobacco. She reports that she does not drink alcohol and does not use drugs.  Allergies:  Allergies  Allergen Reactions   Lipitor [Atorvastatin] Other (See Comments)    Dizzy and make her head feel huge     Medications: I have reviewed the patient's current medications.  Results for orders placed or performed during the hospital encounter of 01/18/24 (from the past 48 hours)  CBC with Differential     Status: Abnormal   Collection Time: 01/19/24 12:33 AM  Result Value Ref Range   WBC 13.0 (H) 4.0 - 10.5 K/uL   RBC 4.21 3.87 - 5.11 MIL/uL   Hemoglobin 11.2 (L) 12.0 - 15.0 g/dL   HCT 19.1 47.8 - 29.5 %   MCV 86.7 80.0 - 100.0 fL   MCH 26.6 26.0 - 34.0 pg   MCHC 30.7 30.0 - 36.0 g/dL   RDW 62.1 (H) 30.8 - 65.7 %   Platelets 245 150 - 400 K/uL   nRBC 0.0 0.0 - 0.2 %   Neutrophils Relative % 88 %   Neutro Abs 11.6 (H) 1.7 - 7.7 K/uL   Lymphocytes Relative 3 %   Lymphs Abs 0.3 (L) 0.7 - 4.0 K/uL   Monocytes Relative 7 %   Monocytes Absolute 0.9 0.1 - 1.0 K/uL   Eosinophils Relative 1 %   Eosinophils Absolute 0.1 0.0 - 0.5 K/uL   Basophils Relative 0 %   Basophils Absolute 0.0 0.0 - 0.1 K/uL   Immature Granulocytes 1 %   Abs Immature Granulocytes 0.08 (H) 0.00 - 0.07 K/uL    Comment: Performed at [facility], [address] [place], [place] [address]  Basic metabolic panel     Status: Abnormal   Collection Time: 01/19/24 12:33 AM  Result Value  Ref Range   Sodium 137 135 - 145 mmol/L   Potassium 4.0 3.5 - 5.1 mmol/L   Chloride 105 98 - 111 mmol/L   CO2 21 (L) 22 - 32 mmol/L   Glucose, Bld 135 (H) 70 - 99 mg/dL    Comment: Glucose reference range applies only to samples taken after fasting for at least 8 hours.   BUN 19 8 - 23 mg/dL   Creatinine, Ser 2.95 (H) 0.44 - 1.00 mg/dL  Calcium 8.4 (L) 8.9 - 10.3 mg/dL   GFR, Estimated 35 (L) >60 mL/min    Comment: (NOTE) Calculated using the CKD-EPI Creatinine Equation (2021)    Anion gap 11 5 - 15    Comment: Performed at [facility], [address], [place], [place] [address]  Basic metabolic panel     Status: Abnormal   Collection Time: 01/19/24  2:31 AM  Result Value Ref Range   Sodium 136 135 - 145 mmol/L   Potassium 4.3 3.5 - 5.1 mmol/L   Chloride 104 98 - 111 mmol/L   CO2 19 (L) 22 - 32 mmol/L   Glucose, Bld 176 (H) 70 - 99 mg/dL    Comment: Glucose reference range applies only to samples taken after fasting for at least 8 hours.   BUN 19 8 - 23 mg/dL   Creatinine, Ser 6.04 (H) 0.44 - 1.00 mg/dL   Calcium 8.2 (L) 8.9 - 10.3 mg/dL   GFR, Estimated 35 (L) >60 mL/min    Comment: (NOTE) Calculated using the CKD-EPI Creatinine Equation (2021)    Anion gap 13 5 - 15    Comment: Performed at [facility], [address], [place], [place] [address]  Troponin I (High Sensitivity)     Status: Abnormal   Collection Time: 01/19/24  2:31 AM  Result Value Ref Range   Troponin I (High Sensitivity) 23 (H) <18 ng/L    Comment: (NOTE) Elevated high sensitivity troponin I (hsTnI) values and significant  changes across serial measurements may suggest ACS but many other  chronic and acute conditions are known to elevate hsTnI results.  Refer to the "Links" section for chest pain algorithms and additional  guidance. Performed at [facility], [address], [place], [place] [address]   CBC     Status: Abnormal   Collection Time: 01/19/24  3:58 AM  Result Value  Ref Range   WBC 11.4 (H) 4.0 - 10.5 K/uL   RBC 3.76 (L) 3.87 - 5.11 MIL/uL   Hemoglobin 10.0 (L) 12.0 - 15.0 g/dL   HCT 78.2 (L) 95.6 - 21.3 %   MCV 85.6 80.0 - 100.0 fL   MCH 26.6 26.0 - 34.0 pg   MCHC 31.1 30.0 - 36.0 g/dL   RDW 08.6 (H) 57.8 - 46.9 %   Platelets 234 150 - 400 K/uL   nRBC 0.0 0.0 - 0.2 %    Comment: Performed at [facility], [address] [place], [place] [address]  Troponin I (High Sensitivity)     Status: Abnormal   Collection Time: 01/19/24  3:58 AM  Result Value Ref Range   Troponin I (High Sensitivity) 25 (H) <18 ng/L    Comment: (NOTE) Elevated high sensitivity troponin I (hsTnI) values and significant  changes across serial measurements may suggest ACS but many other  chronic and acute conditions are known to elevate hsTnI results.  Refer to the "Links" section for chest pain algorithms and additional  guidance. Performed at [facility], [address] [place], [place] [address]     CT HEAD WO CONTRAST ( ) Result Date: 01/19/2024 CLINICAL DATA:  Head trauma, minor (Age >= 65y); Polytrauma, blunt. EXAM: CT HEAD WITHOUT CONTRAST CT CERVICAL SPINE WITHOUT CONTRAST TECHNIQUE: Multidetector CT imaging of the head and cervical spine was performed following the standard protocol without intravenous contrast. Multiplanar CT image reconstructions of the cervical spine were also generated. RADIATION DOSE REDUCTION: This exam was performed  according to the departmental dose-optimization program which includes automated exposure control, adjustment of the mA and/or kV according to patient size and/or use of iterative reconstruction technique. COMPARISON:  CT head and CT cervical spine 12/09/2022. FINDINGS: CT HEAD FINDINGS Motion limited. Brain: No evidence of acute infarction, hemorrhage, hydrocephalus, extra-axial collection or mass lesion/mass effect. Chronic microvascular ischemic change, similar. Vascular: No hyperdense vessel. Skull: No acute  fracture. Sinuses/Orbits: Clear sinuses.  No acute orbital findings. Other: No mastoid effusions. CT CERVICAL SPINE FINDINGS Alignment: Similar mild in anterolisthesis of C3 on C4. No substantial sagittal subluxation. Skull base and vertebrae: No evidence of acute fracture. Soft tissues and spinal canal: No prevertebral fluid or swelling. No visible canal hematoma. Disc levels:  Moderate multilevel degenerative change. Upper chest: Chronic changes in the left lung apex, similar to prior CT chest. IMPRESSION: No evidence of acute abnormality intracranially in the cervical spine. Electronically Signed   By: Feliberto Harts M.D.   On: 01/19/2024 00:57   CT CERVICAL SPINE WO CONTRAST Result Date: 01/19/2024 CLINICAL DATA:  Head trauma, minor (Age >= 65y); Polytrauma, blunt. EXAM: CT HEAD WITHOUT CONTRAST CT CERVICAL SPINE WITHOUT CONTRAST TECHNIQUE: Multidetector CT imaging of the head and cervical spine was performed following the standard protocol without intravenous contrast. Multiplanar CT image reconstructions of the cervical spine were also generated. RADIATION DOSE REDUCTION: This exam was performed according to the departmental dose-optimization program which includes automated exposure control, adjustment of the mA and/or kV according to patient size and/or use of iterative reconstruction technique. COMPARISON:  CT head and CT cervical spine 12/09/2022. FINDINGS: CT HEAD FINDINGS Motion limited. Brain: No evidence of acute infarction, hemorrhage, hydrocephalus, extra-axial collection or mass lesion/mass effect. Chronic microvascular ischemic change, similar. Vascular: No hyperdense vessel. Skull: No acute fracture. Sinuses/Orbits: Clear sinuses.  No acute orbital findings. Other: No mastoid effusions. CT CERVICAL SPINE FINDINGS Alignment: Similar mild in anterolisthesis of C3 on C4. No substantial sagittal subluxation. Skull base and vertebrae: No evidence of acute fracture. Soft tissues and spinal canal:  No prevertebral fluid or swelling. No visible canal hematoma. Disc levels:  Moderate multilevel degenerative change. Upper chest: Chronic changes in the left lung apex, similar to prior CT chest. IMPRESSION: No evidence of acute abnormality intracranially in the cervical spine. Electronically Signed   By: Feliberto Harts M.D.   On: 01/19/2024 00:57   CT Hip Right Wo Contrast Result Date: 01/19/2024 CLINICAL DATA:  EVALUATE FOR FRACTURE. EXAM: CT OF THE RIGHT HIP WITHOUT CONTRAST TECHNIQUE: Multidetector CT imaging of the right hip was performed according to the standard protocol. Multiplanar CT image reconstructions were also generated. RADIATION DOSE REDUCTION: This exam was performed according to the departmental dose-optimization program which includes automated exposure control, adjustment of the mA and/or kV according to patient size and/or use of iterative reconstruction technique. COMPARISON:  X-ray same day. CT angiogram chest abdomen and pelvis 03/26/2023. FINDINGS: Bones/Joint/Cartilage Bones are diffusely osteopenic. There is a comminuted intratrochanteric fracture. The fracture appears impacted with apex anterior angulation. There is no significant displacement or dislocation. The bones are diffusely osteopenic. There are mild degenerative changes of the right hip with joint space narrowing and subchondral cystic change. Ligaments Suboptimally assessed by CT. Muscles and Tendons There is intramuscular edema surrounding the fracture. Soft tissues No focal hematoma or fluid collection identified. Colonic diverticulosis is present. There are atherosclerotic calcifications of the aorta and iliac arteries. IMPRESSION: 1. Comminuted, impacted, and angulated intratrochanteric fracture of the right femur. 2. Diffuse osteopenia. 3. Colonic  diverticulosis. 4. Aortic atherosclerosis. Aortic Atherosclerosis (ICD10-I70.0). Electronically Signed   By: Darliss Cheney M.D.   On: 01/19/2024 00:44   DG Chest  Portable 1 View Result Date: 01/18/2024 CLINICAL DATA:  Status post fall with right hip pain. EXAM: PORTABLE CHEST 1 VIEW COMPARISON:  Radiograph in CT 03/26/2023 FINDINGS: Stable heart size and mediastinal contours. Aortic atherosclerosis. Assessment of the left mid lower lung zone is limited due to overlying artifact, possibly related to patient's bra. There is vascular congestion without overt edema. The right middle lobe partially calcified mass on prior CT is only faintly visualized by radiograph, but grossly stable. No pneumothorax or large pleural effusion. IMPRESSION: 1. Left lung assessment is limited due to overlying artifact. 2. Vascular congestion without edema.  Stable prominent heart size. 3. Chronic chronic partially calcified right middle lobe lesion is faintly visualized. Electronically Signed   By: Narda Rutherford M.D.   On: 01/18/2024 22:57   DG Hip Unilat W or Wo Pelvis 2-3 Views Right Result Date: 01/18/2024 CLINICAL DATA:  Fall EXAM: DG HIP (WITH OR WITHOUT PELVIS) 2-3V RIGHT COMPARISON:  CT chest abdomen and pelvis 03/26/2023. FINDINGS: The right femoral neck appears slightly foreshortened and a subtle impacted fracture can not be excluded. No discrete fracture line visualized. There is no dislocation. Joint spaces are maintained. There are vascular calcifications in the lower extremities and pelvis. IMPRESSION: The right femoral neck appears slightly foreshortened and a subtle impacted fracture can not be excluded. Recommend further evaluation with CT. Electronically Signed   By: Darliss Cheney M.D.   On: 01/18/2024 22:53    Review of Systems  Unable to perform ROS: Mental status change   Blood pressure 95/61, pulse 97, temperature 97.6 F (36.4 C), temperature source Oral, resp. rate 19, SpO2 96%. Physical Exam Constitutional:      General: She is not in acute distress.    Appearance: She is well-developed. She is not diaphoretic.  HENT:     Head: Normocephalic and  atraumatic.  Eyes:     General: No scleral icterus.       Right eye: No discharge.        Left eye: No discharge.     Conjunctiva/sclera: Conjunctivae normal.  Cardiovascular:     Rate and Rhythm: Normal rate and regular rhythm.  Pulmonary:     Effort: Pulmonary effort is normal. No respiratory distress.  Musculoskeletal:     Cervical back: Normal range of motion.     Comments: RLE No traumatic wounds, ecchymosis, or rash  Nontender  No knee or ankle effusion  Knee stable to varus/ valgus and anterior/posterior stress  Sens DPN, SPN, TN intact  Motor EHL, ext, flex, evers 5/5  DP 2+, PT 2+, No significant edema  Skin:    General: Skin is warm and dry.  Neurological:     Mental Status: She is alert.  Psychiatric:        Mood and Affect: Mood is anxious.        Behavior: Behavior is agitated.     Assessment/Plan: Right hip fx -- Plan hip hemi tomorrow with Dr. Jena Gauss. Please keep NPO after MN. Multiple medical problems including CHF, CAD, hyperlipidemia, prior TIA on Eliquis -- per primary service. Please hold Eliquis until day after surgery.    Freeman Caldron, PA-C Orthopedic Surgery [phone] 01/19/2024, 9:24 AM

## 2024-01-19 NOTE — TOC CAGE-AID Note (Signed)
Transition of Care [facility]) - CAGE-AID Screening   Patient Details  Name: Leslie Carr MRN: [id number] Date of Birth: May 23, 1931  Transition of Care [facility]) CM/SW Contact:    Janora Norlander, RN Phone Number: [phone] 01/19/2024, 7:10 PM   Clinical Narrative: Pt here after a GLF.  Pt has confusion therefore cage aid unable to be complete.    CAGE-AID Screening: Substance Abuse Screening unable to be completed due to: : Patient unable to participate

## 2024-01-19 NOTE — Anesthesia Preprocedure Evaluation (Signed)
Anesthesia Evaluation  Patient identified by MRN, date of birth, ID band Patient awake    Reviewed: Allergy & Precautions, H&P , Patient's Chart, lab work & pertinent test results  Airway Mallampati: II  TM Distance: >3 FB Neck ROM: Full    Dental no notable dental hx.    Pulmonary neg pulmonary ROS, former smoker   Pulmonary exam normal breath sounds clear to auscultation       Cardiovascular negative cardio ROS Normal cardiovascular exam Rhythm:Regular Rate:Normal     Neuro/Psych  PSYCHIATRIC DISORDERS     Dementia negative neurological ROS     GI/Hepatic negative GI ROS, Neg liver ROS,,,  Endo/Other  negative endocrine ROS    Renal/GU negative Renal ROS  negative genitourinary   Musculoskeletal R hip fx   Abdominal   Peds negative pediatric ROS (+)  Hematology negative hematology ROS (+)   Anesthesia Other Findings   Reproductive/Obstetrics negative OB ROS                             Anesthesia Physical Anesthesia Plan  ASA: 3  Anesthesia Plan: Regional   Post-op Pain Management:    Induction:   PONV Risk Score and Plan: 2  Airway Management Planned: Natural Airway  Additional Equipment: None  Intra-op Plan:   Post-operative Plan:   Informed Consent: I have reviewed the patients History and Physical, chart, labs and discussed the procedure including the risks, benefits and alternatives for the proposed anesthesia with the patient or authorized representative who has indicated his/her understanding and acceptance.     Consent reviewed with POA  Plan Discussed with: CRNA  Anesthesia Plan Comments: (R peng block for hip fx, surgery scheduled for tomorrow. Pt demented, POA consented)       Anesthesia Quick Evaluation

## 2024-01-19 NOTE — Progress Notes (Signed)
PT Cancellation Note  Patient Details Name: Leslie Carr MRN: [id number] DOB: 1931/06/14   Cancelled Treatment:    Reason Eval/Treat Not Completed: Medical issues which prohibited therapy. Pt for hip surgery tomorrow. PT will plan to see on 01/21/24 if medically ready after surgery.    Donnella Sham 01/19/2024, 9:57 AM Lavona Mound, PT   Acute Rehabilitation Services  Office [phone] 01/19/2024

## 2024-01-19 NOTE — ED Notes (Signed)
Assumed care of pt and pt currently resting in bed with soft mitts on at this time. Skin intact and mitts fit loosely

## 2024-01-19 NOTE — Progress Notes (Addendum)
Presents after a fall. Suffered a R hip fx - Plan: hip hemi , 1/29. Hx of CHF, CAD, hyperlipidemia, TIA .   01/19/24 1424  TOC Brief Assessment  Insurance and Status Reviewed  Patient has primary care physician Yes  Home environment has been reviewed From [facility]  Prior level of function: Required min. assistance with ADL's  Prior/Current Home Services No current home services  Social Drivers of Health Review SDOH reviewed no interventions necessary  Readmission risk has been reviewed No  Transition of care needs transition of care needs identified, TOC will continue to follow   [facility] team following and will assist with needs... Gae Gallop RN,BSN,CM [phone]

## 2024-01-19 NOTE — Anesthesia Procedure Notes (Signed)
Anesthesia Regional Block: Peng block   Pre-Anesthetic Checklist: , timeout performed,  Correct Patient, Correct Site, Correct Laterality,  Correct Procedure, Correct Position, site marked,  Risks and benefits discussed,  Surgical consent,  Pre-op evaluation,  At surgeon's request and post-op pain management  Laterality: Right  Prep: Maximum Sterile Barrier Precautions used, chloraprep       Needles:  Injection technique: Single-shot  Needle Type: Echogenic Stimulator Needle     Needle Length: 9cm  Needle Gauge: 22     Additional Needles:   Procedures:,,,, ultrasound used (permanent image in chart),,    Narrative:  Start time: 01/19/2024 3:10 PM End time: 01/19/2024 3:15 PM Injection made incrementally with aspirations every 5 mL.  Performed by: Personally  Anesthesiologist: Lannie Fields, DO  Additional Notes: Monitors applied. No increased pain on injection. No increased resistance to injection. Injection made in 5cc increments. Good needle visualization. Patient tolerated procedure well.

## 2024-01-19 NOTE — Progress Notes (Signed)
The patient is a 89 yr old woman who presented to [facility] ED from a skilled nursing facility via ambulance. She had suffered a witnessed ground level fall there. It was reported that she hit her head. She was alert and oriented in the ED. She was tachycardic, but hemodynamically stable. She was complaining in her right hip. X-ray demonstrated impacted intertrochanteric fracture of her right femur.   She was admitted by my colleague, Dr. Mickle Mallory, early this morning. She is seen in her room on the floor. She has just been medicated and is very somnolent. No new complaints. She is noted to have a 3/6 AS murmur with radiation to the carotids. Her last echocardiogram was in 2021. At that time her AS was guaged to be mild to moderate. She is scheduled to go to the OR tomorrow. This information will be helpful in guiding her volume management. Otherwise her heart and lung exam was within normal limits. Abdomen was soft, non tender, non distended. Family is at bedside. All questions were answered to the best of my ability.  Vital signs are within normal limits. She is requiring 2L O2 by nasal cannula to maintain a saturation of 92%.  She is made to be NPO after midnight. She is on a heart healthy diet. Plan is for surgery tomorrow.

## 2024-01-19 NOTE — H&P (Signed)
History and Physical    ZYKIRIA BRUENING ZOX:[id number] DOB: 03/02/1931 DOA: 01/18/2024  PCP: Pcp, No   Chief Complaint:  fall  HPI: Leslie Carr is a 89 y.o. female with medical history significant of CHF, CAD, hyperlipidemia, prior TIA who presented emergency department due to a fall.  Patient had a ground-level fall at her skilled nursing facility.  She is on anticoagulation for history of DVT.  It was reportedly witnessed and she hit her head.  She was brought to the ER for further assessment.  On arrival she was alert and oriented.  She was tachycardic in the low 100s and otherwise hemodynamically stable.  Labs were obtained which showed WBC 13.0, hemoglobin 11.2, creatinine 1.4, bicarb 21, CT hip showed impacted right femur fracture.  CT spine showed no other fractures.  CT head showed no acute intracranial abnormalities.  Orthopedics was consulted and recommended holding anticoagulation for operative intervention tomorrow.  On evaluation she was endorsing pain overlying her right hip.  She denies any fever chills or other complaints.  She has no chest pain shortness of breath on exertion.   Review of Systems: Review of Systems  Constitutional:  Negative for chills, fever, malaise/fatigue and weight loss.  HENT: Negative.    Eyes: Negative.   Respiratory: Negative.    Cardiovascular: Negative.   Gastrointestinal: Negative.   Genitourinary: Negative.   Musculoskeletal:  Positive for falls and joint pain.  Skin: Negative.  Negative for rash.  Neurological: Negative.   Endo/Heme/Allergies: Negative.   Psychiatric/Behavioral: Negative.    All other systems reviewed and are negative.    As per HPI otherwise 10 point review of systems negative.   Allergies  Allergen Reactions   Lipitor [Atorvastatin] Other (See Comments)    Dizzy and make her head feel huge     Past Medical History:  Diagnosis Date   Acute metabolic encephalopathy 02/04/2020   Breast cancer (HCC)    CHF  (congestive heart failure) (HCC)    Cognitive impairment    Coronary artery disease due to calcified coronary lesion 09/2018   ACS PCI of OM2 on 09/21/2018 followed by staged orbital atherectomy based PCI of ostial to distal RCA.  (Details in Surgical History)   Diastolic dysfunction with chronic heart failure (HCC)    History of bronchitis    Hypercholesteremia    Mass of middle lobe of right lung    Mild aortic stenosis by prior echocardiogram 09/2018   Mean gradient 14 million mercury, peak 24 mmHg   Numbness and tingling in left hand    Pleural effusion on left    Shortness of breath dyspnea    "only going up a flight of stairs"   Stroke [facility]) 2014   left sided weakness - mainly left hand   TIA (transient ischemic attack) 01/2020    Past Surgical History:  Procedure Laterality Date   ABDOMINAL HYSTERECTOMY  1976   CATARACT EXTRACTION Right    COLONOSCOPY     CORONARY ANGIOPLASTY WITH STENT PLACEMENT     CORONARY ATHERECTOMY N/A 09/24/2018   Procedure: CORONARY ATHERECTOMY-WITH CORONARY STENT PLACEMENT;  Surgeon: Marykay Lex, MD;  Location: [facility] INVASIVE CV LAB;; staged PCI: Ostial RCA 70%, prox-mid RCA 70% and mid RCA 95 and 50%. ->  Orbital atherectomy followed by 3 overlapping DES stents (proximal to distal): Xience Sierra 2.75 mm a 28 mm (2.9 mm), 2.5 mm x 33 mm (2.8 mm), 2.25 mm a 12 mm (tapered from 2.6 to 2.4 mm)  CORONARY STENT INTERVENTION N/A 09/21/2018   Procedure: CORONARY STENT INTERVENTION;  Surgeon: Marykay Lex, MD;  Location: [facility] INVASIVE CV LAB;  Service: Cardiovascular;; Ostial OM2 85% (DES PCI, Xience Sierra 2.5 mm x 18 mm-minimal residual stenosis)   DECORTICATION Left 04/17/2016   Procedure: Visceral Pleural DECORTICATION;  Surgeon: Loreli Slot, MD;  Location: [facility] OR;  Service: Thoracic;  Laterality: Left;   ESOPHAGOGASTRODUODENOSCOPY N/A 04/20/2019   Procedure: ESOPHAGOGASTRODUODENOSCOPY (EGD);  Surgeon: Malissa Hippo, MD;  Location: [facility] ENDO  SUITE;  Service: Endoscopy;  Laterality: N/A;  APC- circumferential   FOOT SURGERY Right    "bone spur removal"   LEFT HEART CATH AND CORONARY ANGIOGRAPHY N/A 09/21/2018   Procedure: LEFT HEART CATH AND CORONARY ANGIOGRAPHY;  Surgeon: Marykay Lex, MD;  Location: [facility] INVASIVE CV LAB;  Service: Cardiovascular;; Ostial OM2 85% (DES PCI), dLCx 45%.  Lesion #2-3: Ostial RCA 70%, prox-mid RCA 70% and mid RCA 95 and 50%.  Distal RCA 30%, ostial RPDA 30%.  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  GR 1 DD.  Mild aortic s stenosis (mean gradient 14 mmHg, peak gradient 24 mmHg).  Mild LA dilation.  Lipomatous hypertrophy of intra-atrial septum.;; b) February 2021: EF 60 to 65%.  No R WMA.  Mild concentric LVH.  GR 1 DD.  Normal RV function.  Mild LA dilation.  Mild calcific aortic stenosis (mean gradient 10 mmHg-but visually appears to be worse).  No change   VIDEO ASSISTED THORACOSCOPY Left 04/17/2016   Procedure:  Left VIDEO ASSISTED THORACOSCOPY with Drainage of Pleural Effusion, Pleural Biopsy, Visceral Pleural Decortication and Placement of On-Q pain pump;  Surgeon: Loreli Slot, MD;  Location: [facility] OR;  Service: Thoracic;  Laterality: Left;     reports that she has quit smoking. She has never used smokeless tobacco. She reports that she does not drink alcohol and does not use drugs.  Family History  Problem Relation Age of Onset   Hypertension Mother    Hypertension Father    Heart disease Neg Hx    Heart attack Neg Hx     Hyperlipidemia Neg Hx    Heart failure Neg Hx     Prior to Admission medications   Medication Sig Start Date End Date Taking? Authorizing Provider  acetaminophen (TYLENOL) 325 MG tablet Take 650 mg by mouth every 6 (six) hours as needed for mild pain or fever. For pain when needed.    [provider]  apixaban (ELIQUIS) 5 MG TABS tablet Take 1 tablet (5 mg total) by mouth 2 (two) times daily. 02/06/20   Joseph Art, DO  aspirin EC 81 MG EC tablet Take 1 tablet (81 mg total) by mouth daily. 02/06/20   Joseph Art, DO  cephALEXin (KEFLEX) 500 MG capsule Take 1 capsule (500 mg total) by mouth 4 (four) times daily. 12/25/22   Elayne Snare K, DO  docusate sodium (COLACE) 100 MG capsule Take 100 mg by mouth daily as needed for mild constipation.  [provider]  mirtazapine (REMERON) 7.5 MG tablet Take 7.5 mg by mouth at bedtime. 12/02/22   [provider]  Multiple Vitamin (MULTIVITAMIN WITH MINERALS) TABS tablet Take 1 tablet by mouth daily. 12/12/22   Calvert Cantor, MD  polyethylene glycol (MIRALAX) 17 g packet Take 17 g by mouth daily. 12/25/22   Elayne Snare K, DO  pravastatin (PRAVACHOL) 40 MG tablet Take 40 mg by mouth daily. 01/02/20   [provider]  psyllium (METAMUCIL) 0.52 g capsule Take 1 capsule (0.52 g total) by mouth daily. 12/25/22   Rexford Maus, DO    Physical Exam: Vitals:   01/18/24 2138 01/18/24 2145 01/18/24 2317 01/19/24 0058  BP: (!) 137/90  122/70 130/78  Pulse: (!) 117  (!) 102 (!) 114  Resp: (!) 22  (!) 22 (!) 22  Temp: 98.3 F (36.8 C)     TempSrc: Oral     SpO2: 93% 93% 91% 93%   Physical Exam Constitutional:      Appearance: She is normal weight.  HENT:     Head: Normocephalic.     Nose: Nose normal.     Mouth/Throat:     Mouth: Mucous membranes are moist.     Pharynx: Oropharynx is clear.  Eyes:     Extraocular Movements: Extraocular movements intact.     Pupils: Pupils are equal, round, and  reactive to light.  Cardiovascular:     Rate and Rhythm: Normal rate and regular rhythm.     Pulses: Normal pulses.     Heart sounds: Normal heart sounds.  Pulmonary:     Effort: Pulmonary effort is normal.     Breath sounds: Normal breath sounds.  Abdominal:     General: Abdomen is flat. Bowel sounds are normal.  Musculoskeletal:        General: Normal range of motion.     Cervical back: Normal range of motion.  Skin:    General: Skin is warm.     Capillary Refill: Capillary refill takes less than 2 seconds.     Coloration: Skin is not jaundiced.  Neurological:     General: No focal deficit present.     Mental Status: She is alert.  Psychiatric:        Mood and Affect: Mood normal.        Behavior: Behavior normal.       Labs on Admission: I have personally reviewed the patients's labs and imaging studies.  Assessment/Plan Principal Problem:   Hip fracture (HCC)   # Commuted, impacted right intratrochanteric fracture of right femur - Patient ground-level fall - CT head showed no intracranial abnormalities - Reportedly mechanical in nature - Orthopedics consulted  Plan: Remain n.p.o. Hold anticoagulation As needed fentanyl for pain  # Insomnia-continue Remeron  # Hyperlipidemia-continue pravastatin  # History of paroxysmal A-fib-continue Eliquis.  Will hold in setting of possible operative intervention  # Dementia without behavioral disturbances-continue to monitor.  Patient is oriented to self  # History of CVA-continue to monitor.   Admission status: Observation Med-Surg  Certification: The appropriate patient status for this patient is OBSERVATION. Observation status is judged to be reasonable and necessary in order to provide the required intensity of service to ensure the patient's safety. The patient's presenting symptoms, physical exam findings, and initial radiographic and laboratory data in the context of their medical condition is felt to place them  at decreased risk for further clinical deterioration. Furthermore, it is anticipated that the patient will be  medically stable for discharge from the hospital within 2 midnights of admission.     Alan Mulder MD [facility] If 7PM-7AM, please contact night-coverage [email or web address]  01/19/2024, 1:30 AM

## 2024-01-19 NOTE — Care Plan (Signed)
Orthopaedic Surgery Plan of Care Note   -history and imaging reviewed with primary team (ER) -pt has right displaced basicervical femoral neck fx -PMH extensive & complex -admit to Hospitalist team -please keep NPO and hold VTE ppx -plan for OR Jan 28 pending discussion w trauma/arthroplasty colleagues -full consult note to follow   Netta Cedars, MD Orthopaedic Surgery [facility]

## 2024-01-19 NOTE — H&P (View-Only) (Signed)
Reason for Consult:Right hip fx Referring Physician: Ava Swayze Time called: 1308 Time at bedside: 0920   Leslie Carr is an 89 y.o. female.  HPI: Leslie Carr fell at the [facility]. She had immediate right hip pain and could not get up. She was brought to the ED where x-rays showed a right hip fx and orthopedic surgery was consulted. She is confused this morning and cannot contribute meaningfully to history.  Past Medical History:  Diagnosis Date   Acute metabolic encephalopathy 02/04/2020   Breast cancer (HCC)    CHF (congestive heart failure) (HCC)    Cognitive impairment    Coronary artery disease due to calcified coronary lesion 09/2018   ACS PCI of OM2 on 09/21/2018 followed by staged orbital atherectomy based PCI of ostial to distal RCA.  (Details in Surgical History)   Diastolic dysfunction with chronic heart failure (HCC)    History of bronchitis    Hypercholesteremia    Mass of middle lobe of right lung    Mild aortic stenosis by prior echocardiogram 09/2018   Mean gradient 14 million mercury, peak 24 mmHg   Numbness and tingling in left hand    Pleural effusion on left    Shortness of breath dyspnea    "only going up a flight of stairs"   Stroke [facility]) 2014   left sided weakness - mainly left hand   TIA (transient ischemic attack) 01/2020    Past Surgical History:  Procedure Laterality Date   ABDOMINAL HYSTERECTOMY  1976   CATARACT EXTRACTION Right    COLONOSCOPY     CORONARY ANGIOPLASTY WITH STENT PLACEMENT     CORONARY ATHERECTOMY N/A 09/24/2018   Procedure: CORONARY ATHERECTOMY-WITH CORONARY STENT PLACEMENT;  Surgeon: Marykay Lex, MD;  Location: [facility] INVASIVE CV LAB;; staged PCI: Ostial RCA 70%, prox-mid RCA 70% and mid RCA 95 and 50%. ->  Orbital atherectomy followed by 3 overlapping DES stents (proximal to distal): Xience Sierra 2.75 mm a 28 mm (2.9 mm), 2.5 mm x 33 mm (2.8 mm), 2.25 mm a 12 mm (tapered from 2.6 to 2.4 mm)   CORONARY STENT  INTERVENTION N/A 09/21/2018   Procedure: CORONARY STENT INTERVENTION;  Surgeon: Marykay Lex, MD;  Location: [facility] INVASIVE CV LAB;  Service: Cardiovascular;; Ostial OM2 85% (DES PCI, Xience Sierra 2.5 mm x 18 mm-minimal residual stenosis)   DECORTICATION Left 04/17/2016   Procedure: Visceral Pleural DECORTICATION;  Surgeon: Loreli Slot, MD;  Location[facility] OR;  Service: Thoracic;  Laterality: Left;   ESOPHAGOGASTRODUODENOSCOPY N/A 04/20/2019   Procedure: ESOPHAGOGASTRODUODENOSCOPY (EGD);  Surgeon: Malissa Hippo, MD;  [facility] ENDO [facility];  Service: Endoscopy;  Laterality: N/A;  APC- circumferential   FOOT SURGERY Right    "bone spur removal"   LEFT HEART CATH AND CORONARY ANGIOGRAPHY N/A 09/21/2018   Procedure: LEFT HEART CATH AND CORONARY ANGIOGRAPHY;  Surgeon: Marykay Lex, MD;  Location: [facility] INVASIVE CV LAB;  Service: Cardiovascular;; Ostial OM2 85% (DES PCI), dLCx 45%.  Lesion #2-3: Ostial RCA 70%, prox-mid RCA 70% and mid RCA 95 and 50%.  Distal RCA 30%, ostial RPDA 30%.  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  GR 1 DD.  Mild aortic s stenosis (mean gradient 14 mmHg, peak gradient 24 mmHg).  Mild LA dilation.  Lipomatous hypertrophy of intra-atrial septum.;; b) February 2021: EF 60 to 65%.  No R WMA.  Mild concentric LVH.  GR 1 DD.  Normal RV function.  Mild LA dilation.  Mild calcific aortic stenosis (mean gradient 10 mmHg-but visually appears to be worse).  No change   VIDEO ASSISTED THORACOSCOPY Left 04/17/2016    Procedure:  Left VIDEO ASSISTED THORACOSCOPY with Drainage of Pleural Effusion, Pleural Biopsy, Visceral Pleural Decortication and Placement of On-Q pain pump;  Surgeon: Loreli Slot, MD;  Location: [facility] OR;  Service: Thoracic;  Laterality: Left;    Family History  Problem Relation Age of Onset   Hypertension Mother    Hypertension Father    Heart disease Neg Hx    Heart attack Neg Hx    Hyperlipidemia Neg Hx    Heart failure Neg Hx     Social History:  reports that she has quit smoking. She has never used smokeless tobacco. She reports that she does not drink alcohol and does not use drugs.  Allergies:  Allergies  Allergen Reactions   Lipitor [Atorvastatin] Other (See Comments)    Dizzy and make her head feel huge     Medications: I have reviewed the patient's current medications.  Results for orders placed or performed during the hospital encounter of 01/18/24 (from the past 48 hours)  CBC with Differential     Status: Abnormal   Collection Time: 01/19/24 12:33 AM  Result Value Ref Range   WBC 13.0 (H) 4.0 - 10.5 K/uL   RBC 4.21 3.87 - 5.11 MIL/uL   Hemoglobin 11.2 (L) 12.0 - 15.0 g/dL   HCT 40.9 81.1 - 91.4 %   MCV 86.7 80.0 - 100.0 fL   MCH 26.6 26.0 - 34.0 pg   MCHC 30.7 30.0 - 36.0 g/dL   RDW 78.2 (H) 95.6 - 21.3 %   Platelets 245 150 - 400 K/uL   nRBC 0.0 0.0 - 0.2 %   Neutrophils Relative % 88 %   Neutro Abs 11.6 (H) 1.7 - 7.7 K/uL   Lymphocytes Relative 3 %   Lymphs Abs 0.3 (L) 0.7 - 4.0 K/uL   Monocytes Relative 7 %   Monocytes Absolute 0.9 0.1 - 1.0 K/uL   Eosinophils Relative 1 %   Eosinophils Absolute 0.1 0.0 - 0.5 K/uL   Basophils Relative 0 %   Basophils Absolute 0.0 0.0 - 0.1 K/uL   Immature Granulocytes 1 %   Abs Immature Granulocytes 0.08 (H) 0.00 - 0.07 K/uL    Comment: Performed at [facility], [address] [place], [place] [address]  Basic metabolic panel     Status: Abnormal   Collection Time: 01/19/24 12:33 AM  Result Value  Ref Range   Sodium 137 135 - 145 mmol/L   Potassium 4.0 3.5 - 5.1 mmol/L   Chloride 105 98 - 111 mmol/L   CO2 21 (L) 22 - 32 mmol/L   Glucose, Bld 135 (H) 70 - 99 mg/dL    Comment: Glucose reference range applies only to samples taken after fasting for at least 8 hours.   BUN 19 8 - 23 mg/dL   Creatinine, Ser 8.46 (H) 0.44 - 1.00 mg/dL  Calcium 8.4 (L) 8.9 - 10.3 mg/dL   GFR, Estimated 35 (L) >60 mL/min    Comment: (NOTE) Calculated using the CKD-EPI Creatinine Equation (2021)    Anion gap 11 5 - 15    Comment: Performed at [facility], [address], [place], [place] [address]  Basic metabolic panel     Status: Abnormal   Collection Time: 01/19/24  2:31 AM  Result Value Ref Range   Sodium 136 135 - 145 mmol/L   Potassium 4.3 3.5 - 5.1 mmol/L   Chloride 104 98 - 111 mmol/L   CO2 19 (L) 22 - 32 mmol/L   Glucose, Bld 176 (H) 70 - 99 mg/dL    Comment: Glucose reference range applies only to samples taken after fasting for at least 8 hours.   BUN 19 8 - 23 mg/dL   Creatinine, Ser 8.41 (H) 0.44 - 1.00 mg/dL   Calcium 8.2 (L) 8.9 - 10.3 mg/dL   GFR, Estimated 35 (L) >60 mL/min    Comment: (NOTE) Calculated using the CKD-EPI Creatinine Equation (2021)    Anion gap 13 5 - 15    Comment: Performed at [facility], [address] [place], [place] [address]  Troponin I (High Sensitivity)     Status: Abnormal   Collection Time: 01/19/24  2:31 AM  Result Value Ref Range   Troponin I (High Sensitivity) 23 (H) <18 ng/L    Comment: (NOTE) Elevated high sensitivity troponin I (hsTnI) values and significant  changes across serial measurements may suggest ACS but many other  chronic and acute conditions are known to elevate hsTnI results.  Refer to the "Links" section for chest pain algorithms and additional  guidance. Performed at [facility], [address], [place], [place] [address]   CBC     Status: Abnormal   Collection Time: 01/19/24  3:58 AM  Result Value  Ref Range   WBC 11.4 (H) 4.0 - 10.5 K/uL   RBC 3.76 (L) 3.87 - 5.11 MIL/uL   Hemoglobin 10.0 (L) 12.0 - 15.0 g/dL   HCT 53.6 (L) 64.4 - 03.4 %   MCV 85.6 80.0 - 100.0 fL   MCH 26.6 26.0 - 34.0 pg   MCHC 31.1 30.0 - 36.0 g/dL   RDW 74.2 (H) 59.5 - 63.8 %   Platelets 234 150 - 400 K/uL   nRBC 0.0 0.0 - 0.2 %    Comment: Performed at [facility], [address], [place], [place] [address]  Troponin I (High Sensitivity)     Status: Abnormal   Collection Time: 01/19/24  3:58 AM  Result Value Ref Range   Troponin I (High Sensitivity) 25 (H) <18 ng/L    Comment: (NOTE) Elevated high sensitivity troponin I (hsTnI) values and significant  changes across serial measurements may suggest ACS but many other  chronic and acute conditions are known to elevate hsTnI results.  Refer to the "Links" section for chest pain algorithms and additional  guidance. Performed at [facility], [address], [place], [place] [address]     CT HEAD WO CONTRAST ( ) Result Date: 01/19/2024 CLINICAL DATA:  Head trauma, minor (Age >= 65y); Polytrauma, blunt. EXAM: CT HEAD WITHOUT CONTRAST CT CERVICAL SPINE WITHOUT CONTRAST TECHNIQUE: Multidetector CT imaging of the head and cervical spine was performed following the standard protocol without intravenous contrast. Multiplanar CT image reconstructions of the cervical spine were also generated. RADIATION DOSE REDUCTION: This exam was performed  according to the departmental dose-optimization program which includes automated exposure control, adjustment of the mA and/or kV according to patient size and/or use of iterative reconstruction technique. COMPARISON:  CT head and CT cervical spine 12/09/2022. FINDINGS: CT HEAD FINDINGS Motion limited. Brain: No evidence of acute infarction, hemorrhage, hydrocephalus, extra-axial collection or mass lesion/mass effect. Chronic microvascular ischemic change, similar. Vascular: No hyperdense vessel. Skull: No acute  fracture. Sinuses/Orbits: Clear sinuses.  No acute orbital findings. Other: No mastoid effusions. CT CERVICAL SPINE FINDINGS Alignment: Similar mild in anterolisthesis of C3 on C4. No substantial sagittal subluxation. Skull base and vertebrae: No evidence of acute fracture. Soft tissues and spinal canal: No prevertebral fluid or swelling. No visible canal hematoma. Disc levels:  Moderate multilevel degenerative change. Upper chest: Chronic changes in the left lung apex, similar to prior CT chest. IMPRESSION: No evidence of acute abnormality intracranially in the cervical spine. Electronically Signed   By: Feliberto Harts M.D.   On: 01/19/2024 00:57   CT CERVICAL SPINE WO CONTRAST Result Date: 01/19/2024 CLINICAL DATA:  Head trauma, minor (Age >= 65y); Polytrauma, blunt. EXAM: CT HEAD WITHOUT CONTRAST CT CERVICAL SPINE WITHOUT CONTRAST TECHNIQUE: Multidetector CT imaging of the head and cervical spine was performed following the standard protocol without intravenous contrast. Multiplanar CT image reconstructions of the cervical spine were also generated. RADIATION DOSE REDUCTION: This exam was performed according to the departmental dose-optimization program which includes automated exposure control, adjustment of the mA and/or kV according to patient size and/or use of iterative reconstruction technique. COMPARISON:  CT head and CT cervical spine 12/09/2022. FINDINGS: CT HEAD FINDINGS Motion limited. Brain: No evidence of acute infarction, hemorrhage, hydrocephalus, extra-axial collection or mass lesion/mass effect. Chronic microvascular ischemic change, similar. Vascular: No hyperdense vessel. Skull: No acute fracture. Sinuses/Orbits: Clear sinuses.  No acute orbital findings. Other: No mastoid effusions. CT CERVICAL SPINE FINDINGS Alignment: Similar mild in anterolisthesis of C3 on C4. No substantial sagittal subluxation. Skull base and vertebrae: No evidence of acute fracture. Soft tissues and spinal canal:  No prevertebral fluid or swelling. No visible canal hematoma. Disc levels:  Moderate multilevel degenerative change. Upper chest: Chronic changes in the left lung apex, similar to prior CT chest. IMPRESSION: No evidence of acute abnormality intracranially in the cervical spine. Electronically Signed   By: Feliberto Harts M.D.   On: 01/19/2024 00:57   CT Hip Right Wo Contrast Result Date: 01/19/2024 CLINICAL DATA:  EVALUATE FOR FRACTURE. EXAM: CT OF THE RIGHT HIP WITHOUT CONTRAST TECHNIQUE: Multidetector CT imaging of the right hip was performed according to the standard protocol. Multiplanar CT image reconstructions were also generated. RADIATION DOSE REDUCTION: This exam was performed according to the departmental dose-optimization program which includes automated exposure control, adjustment of the mA and/or kV according to patient size and/or use of iterative reconstruction technique. COMPARISON:  X-ray same day. CT angiogram chest abdomen and pelvis 03/26/2023. FINDINGS: Bones/Joint/Cartilage Bones are diffusely osteopenic. There is a comminuted intratrochanteric fracture. The fracture appears impacted with apex anterior angulation. There is no significant displacement or dislocation. The bones are diffusely osteopenic. There are mild degenerative changes of the right hip with joint space narrowing and subchondral cystic change. Ligaments Suboptimally assessed by CT. Muscles and Tendons There is intramuscular edema surrounding the fracture. Soft tissues No focal hematoma or fluid collection identified. Colonic diverticulosis is present. There are atherosclerotic calcifications of the aorta and iliac arteries. IMPRESSION: 1. Comminuted, impacted, and angulated intratrochanteric fracture of the right femur. 2. Diffuse osteopenia. 3. Colonic  diverticulosis. 4. Aortic atherosclerosis. Aortic Atherosclerosis (ICD10-I70.0). Electronically Signed   By: Darliss Cheney M.D.   On: 01/19/2024 00:44   DG Chest  Portable 1 View Result Date: 01/18/2024 CLINICAL DATA:  Status post fall with right hip pain. EXAM: PORTABLE CHEST 1 VIEW COMPARISON:  Radiograph in CT 03/26/2023 FINDINGS: Stable heart size and mediastinal contours. Aortic atherosclerosis. Assessment of the left mid lower lung zone is limited due to overlying artifact, possibly related to patient's bra. There is vascular congestion without overt edema. The right middle lobe partially calcified mass on prior CT is only faintly visualized by radiograph, but grossly stable. No pneumothorax or large pleural effusion. IMPRESSION: 1. Left lung assessment is limited due to overlying artifact. 2. Vascular congestion without edema.  Stable prominent heart size. 3. Chronic chronic partially calcified right middle lobe lesion is faintly visualized. Electronically Signed   By: Narda Rutherford M.D.   On: 01/18/2024 22:57   DG Hip Unilat W or Wo Pelvis 2-3 Views Right Result Date: 01/18/2024 CLINICAL DATA:  Fall EXAM: DG HIP (WITH OR WITHOUT PELVIS) 2-3V RIGHT COMPARISON:  CT chest abdomen and pelvis 03/26/2023. FINDINGS: The right femoral neck appears slightly foreshortened and a subtle impacted fracture can not be excluded. No discrete fracture line visualized. There is no dislocation. Joint spaces are maintained. There are vascular calcifications in the lower extremities and pelvis. IMPRESSION: The right femoral neck appears slightly foreshortened and a subtle impacted fracture can not be excluded. Recommend further evaluation with CT. Electronically Signed   By: Darliss Cheney M.D.   On: 01/18/2024 22:53    Review of Systems  Unable to perform ROS: Mental status change   Blood pressure 95/61, pulse 97, temperature 97.6 F (36.4 C), temperature source Oral, resp. rate 19, SpO2 96%. Physical Exam Constitutional:      General: She is not in acute distress.    Appearance: She is well-developed. She is not diaphoretic.  HENT:     Head: Normocephalic and  atraumatic.  Eyes:     General: No scleral icterus.       Right eye: No discharge.        Left eye: No discharge.     Conjunctiva/sclera: Conjunctivae normal.  Cardiovascular:     Rate and Rhythm: Normal rate and regular rhythm.  Pulmonary:     Effort: Pulmonary effort is normal. No respiratory distress.  Musculoskeletal:     Cervical back: Normal range of motion.     Comments: RLE No traumatic wounds, ecchymosis, or rash  Nontender  No knee or ankle effusion  Knee stable to varus/ valgus and anterior/posterior stress  Sens DPN, SPN, TN intact  Motor EHL, ext, flex, evers 5/5  DP 2+, PT 2+, No significant edema  Skin:    General: Skin is warm and dry.  Neurological:     Mental Status: She is alert.  Psychiatric:        Mood and Affect: Mood is anxious.        Behavior: Behavior is agitated.     Assessment/Plan: Right hip fx -- Plan hip hemi tomorrow with Dr. Jena Gauss. Please keep NPO after MN. Multiple medical problems including CHF, CAD, hyperlipidemia, prior TIA on Eliquis -- per primary service. Please hold Eliquis until day after surgery.    Freeman Caldron, PA-C Orthopedic Surgery [phone] 01/19/2024, 9:24 AM

## 2024-01-20 ENCOUNTER — Inpatient Hospital Stay (HOSPITAL_COMMUNITY): Payer: Medicare Other

## 2024-01-20 ENCOUNTER — Other Ambulatory Visit: Payer: Self-pay

## 2024-01-20 ENCOUNTER — Encounter (HOSPITAL_COMMUNITY)
Admission: EM | Disposition: A | Discharge status: Skilled Nursing Facility | Payer: Self-pay | Source: Skilled Nursing Facility | Attending: Student

## 2024-01-20 ENCOUNTER — Encounter (HOSPITAL_COMMUNITY): Payer: Self-pay | Admitting: Internal Medicine

## 2024-01-20 DIAGNOSIS — I2510 Atherosclerotic heart disease of native coronary artery without angina pectoris: Secondary | ICD-10-CM | POA: Diagnosis not present

## 2024-01-20 DIAGNOSIS — I509 Heart failure, unspecified: Secondary | ICD-10-CM

## 2024-01-20 DIAGNOSIS — E785 Hyperlipidemia, unspecified: Secondary | ICD-10-CM | POA: Diagnosis not present

## 2024-01-20 DIAGNOSIS — I350 Nonrheumatic aortic (valve) stenosis: Secondary | ICD-10-CM

## 2024-01-20 DIAGNOSIS — I272 Pulmonary hypertension, unspecified: Secondary | ICD-10-CM | POA: Diagnosis not present

## 2024-01-20 DIAGNOSIS — S72001D Fracture of unspecified part of neck of right femur, subsequent encounter for closed fracture with routine healing: Secondary | ICD-10-CM | POA: Diagnosis not present

## 2024-01-20 DIAGNOSIS — S72141A Displaced intertrochanteric fracture of right femur, initial encounter for closed fracture: Secondary | ICD-10-CM

## 2024-01-20 DIAGNOSIS — I5022 Chronic systolic (congestive) heart failure: Secondary | ICD-10-CM | POA: Diagnosis not present

## 2024-01-20 HISTORY — PX: INTRAMEDULLARY (IM) NAIL INTERTROCHANTERIC: SHX5875

## 2024-01-20 LAB — CBC WITH DIFFERENTIAL/PLATELET
Abs Immature Granulocytes: 0.1 10*3/uL — ABNORMAL HIGH (ref 0.00–0.07)
Basophils Absolute: 0 10*3/uL (ref 0.0–0.1)
Basophils Relative: 0 %
Eosinophils Absolute: 0 10*3/uL (ref 0.0–0.5)
Eosinophils Relative: 0 %
HCT: 36.2 % (ref 36.0–46.0)
Hemoglobin: 11.6 g/dL — ABNORMAL LOW (ref 12.0–15.0)
Immature Granulocytes: 1 %
Lymphocytes Relative: 1 %
Lymphs Abs: 0.1 10*3/uL — ABNORMAL LOW (ref 0.7–4.0)
MCH: 27.4 pg (ref 26.0–34.0)
MCHC: 32 g/dL (ref 30.0–36.0)
MCV: 85.4 fL (ref 80.0–100.0)
Monocytes Absolute: 0.4 10*3/uL (ref 0.1–1.0)
Monocytes Relative: 5 %
Neutro Abs: 8.8 10*3/uL — ABNORMAL HIGH (ref 1.7–7.7)
Neutrophils Relative %: 93 %
Platelets: 244 10*3/uL (ref 150–400)
RBC: 4.24 MIL/uL (ref 3.87–5.11)
RDW: 16.1 % — ABNORMAL HIGH (ref 11.5–15.5)
WBC: 9.4 10*3/uL (ref 4.0–10.5)
nRBC: 0 % (ref 0.0–0.2)

## 2024-01-20 LAB — ECHOCARDIOGRAM COMPLETE
AR max vel: 0.7 cm2
AV Area VTI: 0.77 cm2
AV Area mean vel: 0.66 cm2
AV Mean grad: 22 mm[Hg]
AV Peak grad: 35.4 mm[Hg]
Ao pk vel: 2.97 m/s
Area-P 1/2: 4.49 cm2
Calc EF: 19.1 %
MV M vel: 4.62 m/s
MV Peak grad: 85.4 mm[Hg]
MV VTI: 1.92 cm2
P 1/2 time: 293 ms
S' Lateral: 4 cm
Single Plane A2C EF: 16.2 %
Single Plane A4C EF: 22.5 %

## 2024-01-20 LAB — BASIC METABOLIC PANEL
Anion gap: 13 (ref 5–15)
BUN: 20 mg/dL (ref 8–23)
CO2: 20 mmol/L — ABNORMAL LOW (ref 22–32)
Calcium: 8.7 mg/dL — ABNORMAL LOW (ref 8.9–10.3)
Chloride: 107 mmol/L (ref 98–111)
Creatinine, Ser: 1.31 mg/dL — ABNORMAL HIGH (ref 0.44–1.00)
GFR, Estimated: 39 mL/min — ABNORMAL LOW (ref >60)
Glucose, Bld: 139 mg/dL — ABNORMAL HIGH (ref 70–99)
Potassium: 4 mmol/L (ref 3.5–5.1)
Sodium: 140 mmol/L (ref 135–145)

## 2024-01-20 LAB — SURGICAL PCR SCREEN
MRSA, PCR: NEGATIVE
Staphylococcus aureus: POSITIVE — AB

## 2024-01-20 SURGERY — FIXATION, FRACTURE, INTERTROCHANTERIC, WITH INTRAMEDULLARY ROD
Anesthesia: General | Site: Leg Upper | Laterality: Right

## 2024-01-20 MED ORDER — VANCOMYCIN HCL 1000 MG IV SOLR
INTRAVENOUS | Status: AC
  Filled 2024-01-20: qty 20

## 2024-01-20 MED ORDER — PROPOFOL 10 MG/ML IV BOLUS
INTRAVENOUS | Status: AC
Start: 2024-01-20 — End: ?
  Filled 2024-01-20: qty 20

## 2024-01-20 MED ORDER — ROCURONIUM BROMIDE 10 MG/ML (PF) SYRINGE
PREFILLED_SYRINGE | INTRAVENOUS | Status: AC
  Filled 2024-01-20: qty 10

## 2024-01-20 MED ORDER — ONDANSETRON HCL 4 MG/2ML IJ SOLN
4.0000 mg | Freq: Four times a day (QID) | INTRAMUSCULAR | Status: DC | PRN
Start: 2024-01-20 — End: 2024-01-26

## 2024-01-20 MED ORDER — MIDAZOLAM HCL 2 MG/2ML IJ SOLN
INTRAMUSCULAR | Status: AC
  Filled 2024-01-20: qty 2

## 2024-01-20 MED ORDER — LIDOCAINE 2% (20 MG/ML) 5 ML SYRINGE
INTRAMUSCULAR | Status: DC | PRN
  Administered 2024-01-20: 10 mg via INTRAVENOUS

## 2024-01-20 MED ORDER — 0.9 % SODIUM CHLORIDE (POUR BTL) OPTIME
TOPICAL | Status: DC | PRN
  Administered 2024-01-20: 1000 mL

## 2024-01-20 MED ORDER — ETOMIDATE 2 MG/ML IV SOLN
INTRAVENOUS | Status: AC
  Filled 2024-01-20: qty 10

## 2024-01-20 MED ORDER — ROCURONIUM BROMIDE 10 MG/ML (PF) SYRINGE
PREFILLED_SYRINGE | INTRAVENOUS | Status: DC | PRN
  Administered 2024-01-20: 50 mg via INTRAVENOUS

## 2024-01-20 MED ORDER — LACTATED RINGERS IV SOLN: INTRAVENOUS | Status: DC

## 2024-01-20 MED ORDER — FENTANYL CITRATE (PF) 100 MCG/2ML IJ SOLN
INTRAMUSCULAR | Status: AC
  Filled 2024-01-20: qty 2

## 2024-01-20 MED ORDER — DEXAMETHASONE SODIUM PHOSPHATE 10 MG/ML IJ SOLN
INTRAMUSCULAR | Status: DC | PRN
  Administered 2024-01-20 (×2): 10 mg via INTRAVENOUS

## 2024-01-20 MED ORDER — ETOMIDATE 2 MG/ML IV SOLN
INTRAVENOUS | Status: DC | PRN
  Administered 2024-01-20: 16 mg via INTRAVENOUS

## 2024-01-20 MED ORDER — PHENYLEPHRINE 80 MCG/ML (10ML) SYRINGE FOR IV PUSH (FOR BLOOD PRESSURE SUPPORT)
PREFILLED_SYRINGE | INTRAVENOUS | Status: AC
  Filled 2024-01-20: qty 10

## 2024-01-20 MED ORDER — DEXAMETHASONE SODIUM PHOSPHATE 10 MG/ML IJ SOLN
INTRAMUSCULAR | Status: AC
  Filled 2024-01-20: qty 1

## 2024-01-20 MED ORDER — FENTANYL CITRATE (PF) 250 MCG/5ML IJ SOLN
INTRAMUSCULAR | Status: AC
  Filled 2024-01-20: qty 5

## 2024-01-20 MED ORDER — ONDANSETRON HCL 4 MG PO TABS: 4.0000 mg | ORAL_TABLET | Freq: Four times a day (QID) | ORAL | Status: DC | PRN

## 2024-01-20 MED ORDER — POLYETHYLENE GLYCOL 3350 17 G PO PACK
17.0000 g | PACK | Freq: Every day | ORAL | Status: DC | PRN
  Administered 2024-01-24 – 2024-01-26 (×2): 17 g via ORAL
  Filled 2024-01-20 (×2): qty 1

## 2024-01-20 MED ORDER — PHENYLEPHRINE HCL-NACL 20-0.9 MG/250ML-% IV SOLN
INTRAVENOUS | Status: DC | PRN
  Administered 2024-01-20: 40 ug/min via INTRAVENOUS

## 2024-01-20 MED ORDER — LIDOCAINE 2% (20 MG/ML) 5 ML SYRINGE
INTRAMUSCULAR | Status: AC
  Filled 2024-01-20: qty 5

## 2024-01-20 MED ORDER — METOCLOPRAMIDE HCL 5 MG/ML IJ SOLN: 5.0000 mg | Freq: Three times a day (TID) | INTRAMUSCULAR | Status: DC | PRN

## 2024-01-20 MED ORDER — DOCUSATE SODIUM 100 MG PO CAPS
100.0000 mg | ORAL_CAPSULE | Freq: Two times a day (BID) | ORAL | Status: DC
  Administered 2024-01-20 – 2024-01-22 (×4): 100 mg via ORAL
  Filled 2024-01-20 (×4): qty 1

## 2024-01-20 MED ORDER — ORAL CARE MOUTH RINSE: 15.0000 mL | Freq: Once | OROMUCOSAL | Status: AC

## 2024-01-20 MED ORDER — SODIUM CHLORIDE 0.9 % IV SOLN: INTRAVENOUS | Status: DC

## 2024-01-20 MED ORDER — VANCOMYCIN HCL 1000 MG IV SOLR
INTRAVENOUS | Status: DC | PRN
  Administered 2024-01-20: 1000 mg via TOPICAL

## 2024-01-20 MED ORDER — MUPIROCIN 2 % EX OINT: 1.0000 | TOPICAL_OINTMENT | Freq: Two times a day (BID) | CUTANEOUS | 0 refills | Status: AC

## 2024-01-20 MED ORDER — CHLORHEXIDINE GLUCONATE 0.12 % MT SOLN
15.0000 mL | Freq: Once | OROMUCOSAL | Status: AC
Start: 2024-01-20 — End: 2024-01-20
  Administered 2024-01-20: 15 mL via OROMUCOSAL

## 2024-01-20 MED ORDER — ONDANSETRON HCL 4 MG/2ML IJ SOLN
INTRAMUSCULAR | Status: AC
  Filled 2024-01-20: qty 2

## 2024-01-20 MED ORDER — FENTANYL CITRATE (PF) 250 MCG/5ML IJ SOLN
INTRAMUSCULAR | Status: DC | PRN
  Administered 2024-01-20 (×2): 50 ug via INTRAVENOUS

## 2024-01-20 MED ORDER — CHLORHEXIDINE GLUCONATE 4 % EX SOLN: 1.0000 | CUTANEOUS | 1 refills | Status: AC

## 2024-01-20 MED ORDER — OLANZAPINE 10 MG IM SOLR
5.0000 mg | Freq: Once | INTRAMUSCULAR | Status: AC
  Administered 2024-01-20: 5 mg via INTRAMUSCULAR
  Filled 2024-01-20: qty 10

## 2024-01-20 MED ORDER — FENTANYL CITRATE (PF) 100 MCG/2ML IJ SOLN
25.0000 ug | INTRAMUSCULAR | Status: DC | PRN
  Administered 2024-01-20 (×2): 25 ug via INTRAVENOUS

## 2024-01-20 MED ORDER — SUGAMMADEX SODIUM 200 MG/2ML IV SOLN
INTRAVENOUS | Status: DC | PRN
  Administered 2024-01-20: 200 mg via INTRAVENOUS

## 2024-01-20 MED ORDER — PERFLUTREN LIPID MICROSPHERE
1.0000 mL | INTRAVENOUS | Status: AC | PRN
  Administered 2024-01-20: 3 mL via INTRAVENOUS

## 2024-01-20 MED ORDER — METOCLOPRAMIDE HCL 5 MG PO TABS: 5.0000 mg | ORAL_TABLET | Freq: Three times a day (TID) | ORAL | Status: DC | PRN

## 2024-01-20 SURGICAL SUPPLY — 43 items
BAG COUNTER SPONGE SURGICOUNT (BAG) IMPLANT
BIT DRILL INTERTAN LAG SCREW (BIT) IMPLANT
BIT DRILL LONG 4.0 (BIT) IMPLANT
BRUSH SCRUB EZ PLAIN DRY (MISCELLANEOUS) ×4 IMPLANT
CHLORAPREP W/TINT 26 (MISCELLANEOUS) ×2 IMPLANT
COVER PERINEAL POST (MISCELLANEOUS) ×2 IMPLANT
COVER SURGICAL LIGHT HANDLE (MISCELLANEOUS) ×2 IMPLANT
DERMABOND ADVANCED .7 DNX12 (GAUZE/BANDAGES/DRESSINGS) ×2 IMPLANT
DERMABOND ADVANCED .7 DNX6 (GAUZE/BANDAGES/DRESSINGS) IMPLANT
DRAPE C-ARM 35X43 STRL (DRAPES) ×2 IMPLANT
DRAPE IMP U-DRAPE 54X76 (DRAPES) ×4 IMPLANT
DRAPE INCISE IOBAN 66X45 STRL (DRAPES) ×2 IMPLANT
DRAPE STERI IOBAN 125X83 (DRAPES) ×2 IMPLANT
DRAPE SURG 17X23 STRL (DRAPES) ×4 IMPLANT
DRAPE U-SHAPE 47X51 STRL (DRAPES) ×2 IMPLANT
DRESSING MEPILEX FLEX 4X4 (GAUZE/BANDAGES/DRESSINGS) ×2 IMPLANT
DRILL BIT LONG 4.0 (BIT) ×1
DRSG MEPILEX FLEX 4X4 (GAUZE/BANDAGES/DRESSINGS) ×1
DRSG MEPILEX POST OP 4X8 (GAUZE/BANDAGES/DRESSINGS) ×2 IMPLANT
ELECT REM PT RETURN 9FT ADLT (ELECTROSURGICAL)
ELECTRODE REM PT RTRN 9FT ADLT (ELECTROSURGICAL) ×2 IMPLANT
GLOVE BIO SURGEON STRL SZ 6.5 (GLOVE) ×6 IMPLANT
GLOVE BIO SURGEON STRL SZ7.5 (GLOVE) ×8 IMPLANT
GLOVE BIOGEL PI IND STRL 6.5 (GLOVE) ×2 IMPLANT
GLOVE BIOGEL PI IND STRL 7.5 (GLOVE) ×2 IMPLANT
GOWN STRL REUS W/ TWL LRG LVL3 (GOWN DISPOSABLE) ×2 IMPLANT
GUIDE PIN 3.2X343 (PIN) ×2
KIT BASIN OR (CUSTOM PROCEDURE TRAY) ×2 IMPLANT
KIT TURNOVER KIT B (KITS) ×2 IMPLANT
MANIFOLD NEPTUNE II (INSTRUMENTS) ×2 IMPLANT
NAIL INTERTAN 10X18 130D 10S (Nail) IMPLANT
NS IRRIG 1000ML POUR BTL (IV SOLUTION) ×2 IMPLANT
PACK GENERAL/GYN (CUSTOM PROCEDURE TRAY) ×2 IMPLANT
PAD ARMBOARD 7.5X6 YLW CONV (MISCELLANEOUS) ×4 IMPLANT
PIN GUIDE 3.2X343MM (PIN) IMPLANT
SCREW LAG COMPR KIT 90/85 (Screw) IMPLANT
SCREW TRIGEN LOW PROF 5.0X30 (Screw) IMPLANT
SUT MNCRL AB 3-0 PS2 18 (SUTURE) ×2 IMPLANT
SUT MON AB 2-0 CT1 36 (SUTURE) IMPLANT
SUT VIC AB 0 CT1 27XBRD ANBCTR (SUTURE) IMPLANT
SUT VIC AB 2-0 CT1 TAPERPNT 27 (SUTURE) ×4 IMPLANT
TOWEL GREEN STERILE (TOWEL DISPOSABLE) ×4 IMPLANT
WATER STERILE IRR 1000ML POUR (IV SOLUTION) ×2 IMPLANT

## 2024-01-20 NOTE — Transfer of Care (Signed)
Immediate Anesthesia Transfer of Care Note  Patient: Leslie Carr  Procedure(s) Performed: INTRAMEDULLARY (IM) NAIL INTERTROCHANTERIC (Right: Leg Upper)  Patient Location: PACU  Anesthesia Type:General  Level of Consciousness: confused  Airway & Oxygen Therapy: Patient Spontanous Breathing and Patient connected to nasal cannula oxygen  Post-op Assessment: Report given to RN and Post -op Vital signs reviewed and stable  Post vital signs: Reviewed and stable  Last Vitals:  Vitals Value Taken Time  BP 150/108 01/20/24 1321  Temp    Pulse 147 01/20/24 1326  Resp 29 01/20/24 1326  SpO2 86 % 01/20/24 1326  Vitals shown include unfiled device data.  Last Pain:  Vitals:   01/20/24 1047  TempSrc: Oral  PainSc:          Complications: No notable events documented.

## 2024-01-20 NOTE — Anesthesia Postprocedure Evaluation (Signed)
Anesthesia Post Note  Patient: Leslie Carr  Procedure(s) Performed: INTRAMEDULLARY (IM) NAIL INTERTROCHANTERIC (Right: Leg Upper)     Patient location during evaluation: PACU Anesthesia Type: General Level of consciousness: awake and alert, patient cooperative and oriented Pain management: pain level controlled Vital Signs Assessment: post-procedure vital signs reviewed and stable Respiratory status: spontaneous breathing, nonlabored ventilation, respiratory function stable and patient connected to nasal cannula oxygen Cardiovascular status: blood pressure returned to baseline and stable Postop Assessment: no apparent nausea or vomiting Anesthetic complications: no   No notable events documented.  Last Vitals:  Vitals:   01/20/24 1418 01/20/24 1430  BP:  (!) 95/51  Pulse: (!) 102   Resp: (!) 31 (!) 24  Temp:  37.3 C  SpO2: 97%     Last Pain:  Vitals:   01/20/24 1047  TempSrc: Oral                 Lillias Difrancesco,[name]

## 2024-01-20 NOTE — Progress Notes (Signed)
OT Cancellation Note  Patient Details Name: Leslie Carr MRN: [id number] DOB: 1931-12-19   Cancelled Treatment:    Reason Eval/Treat Not Completed: Patient not medically ready (pending surgery)  Mateo Flow 01/20/2024, 8:01 AM

## 2024-01-20 NOTE — Op Note (Signed)
Orthopaedic Surgery Operative Note (CSN: [id number] ) Date of Surgery: 01/20/2024  Admit Date: 01/18/2024   Diagnoses: Pre-Op Diagnoses: Right intertrochanteric femur fracture   Post-Op Diagnosis: Same  Procedures: CPT 27245-Cephalomedullary nailing of right intertrochanteric femur fracture  Surgeons : Primary: Roby Lofts, MD  Assistant: Ulyses Southward, PA-C  Location: OR 3   Anesthesia: General   Antibiotics: Ancef 2g preop with 1 gm vancomycin powder placed topically   Tourniquet time: None    Estimated Blood Loss: 50 mL  Complications:* No complications entered in OR log *   Specimens:* No specimens in log *   Implants: Implant Name Type Inv. Item Serial No. Manufacturer Lot No. LRB No. Used Action  NAIL INTERTAN 10X18 130D 10S - [id number] Nail NAIL INTERTAN 10X18 130D 10S  [employer] [id number] Right 1 Implanted  SCREW LAG COMPR KIT 90/85 - [id number] Screw SCREW LAG COMPR KIT 90/85  [employer] [id number] Right 1 Implanted  SCREW TRIGEN LOW PROF 5.0X30 - [id number] Screw SCREW TRIGEN LOW PROF 5.0X30  [employer] [id number] Right 1 Implanted     Indications for Surgery: 89 year old female who sustained a ground-level fall with a right basicervical femoral neck/intertrochanteric femur fracture.  Due to the unstable nature of her injury I recommend proceeding with cephalomedullary nailing.  Risks and benefits were discussed with the patient and her daughter.  Risks include but not limited to bleeding, infection, malunion, nonunion, hardware failure, hardware rotation, nerve and blood vessel injury, DVT, and the possibility anesthetic complications.  They agreed to proceed with surgery and consent was obtained.  Operative Findings: Cephalomedullary nailing of right intertrochanteric femur fracture using [employer] InterTAN 10 x 180 mm nail with a 90 mm lag screw and 85 mm compression screw  Procedure: The patient  was identified in the preoperative holding area. Consent was confirmed with the patient and their family and all questions were answered. The operative extremity was marked after confirmation with the patient. she was then brought back to the operating room by our anesthesia colleagues.  She was placed under general anesthetic and carefully transferred over to a Hana table.  All bony prominences were well-padded.  Traction was applied to the right lower extremity and fluoroscopic imaging showed adequate alignment of the fracture.  The right lower extremity was then prepped and draped in usual sterile fashion.  A timeout was performed to verify the patient, the procedure, and the extremity.  Preoperative antibiotics were dosed.  A small incision proximal to the greater trochanter was made and carried down through skin and subcutaneous tissue.  A threaded guidewire was directed at the tip of the greater trochanter and advanced into the proximal metaphysis.  An entry reamer was then used to enter the medullary canal.  I then passed a 10 x 180 mm nail down the center of the canal.  I then used the targeting arm to direct a threaded guidewire into the head/neck segment.  I confirmed tip apex distance with fluoroscopy and measured the length and decided to use a 90 mm lag screw.  I then drilled the path for the compression screw and placed an antirotation bar.  I then drilled the path for the lag screw and placed the 90 mm lag screw.  I then placed the compression screw and compressed approximately 5 mm.  Excellent fixation was obtained.  I then statically locked the proximal portion of the nail.  I then used the targeting arm to place a  distal interlocking screw from lateral to medial.  I then removed the targeting arm and obtained final fluoroscopic imaging.  The incisions were copiously irrigated.  A gram of vancomycin powder was placed into the incisions.  A layered closure of 2-0 Monocryl and Dermabond was used  to close the skin.  Sterile dressings were applied.  The patient was then awoke from anesthesia and taken to the PACU in stable condition.   Post Op Plan/Instructions: The patient will be weightbearing as tolerated to the right lower extremity.  She will receive postoperative Ancef.  She will be placed back on her Eliquis postoperative day 1 if her hemoglobin is stable.  Will have her mobilize with physical and Occupational Therapy.  I was present and performed the entire surgery.  Ulyses Southward, PA-C did assist me throughout the case. An assistant was necessary given the difficulty in approach, maintenance of reduction and ability to instrument the fracture.   Truitt Merle, MD [facility]

## 2024-01-20 NOTE — Anesthesia Preprocedure Evaluation (Addendum)
Anesthesia Evaluation  Patient identified by MRN, date of birth, ID band Patient awake    Reviewed: Allergy & Precautions, NPO status , Patient's Chart, lab work & pertinent test results  Airway Mallampati: II  TM Distance: >3 FB Neck ROM: Full    Dental  (+) Caps, Dental Advisory Given   Pulmonary former smoker   breath sounds clear to auscultation       Cardiovascular (-) hypertension(-) angina + CAD (PCI), + Cardiac Stents and +CHF  + Valvular Problems/Murmurs AS  Rhythm:Regular Rate:Normal + Systolic murmurs 7/61/6073 ECHO: EF 25-30%, severely reduced LVF, global hypokinesis, mild LVH, Grade 1 DD. Normal RVF, mod Pulm HTN, degenerative MV, with mild-mod MS, mean grad 5 mmHg, tricuspid AV with severe low flow AS, AVA 0.77 cm2   Neuro/Psych       Dementia TIACVA (L sided weakness), Residual Symptoms    GI/Hepatic negative GI ROS, Neg liver ROS,,,  Endo/Other  negative endocrine ROS    Renal/GU Renal InsufficiencyRenal disease     Musculoskeletal   Abdominal   Peds  Hematology Eliquis Hb 11.6, plt 244k   Anesthesia Other Findings H/o breast cancer  Reproductive/Obstetrics                              Anesthesia Physical Anesthesia Plan  ASA: 3  Anesthesia Plan: General   Post-op Pain Management: Tylenol PO (pre-op)*   Induction: Intravenous  PONV Risk Score and Plan: 3 and Ondansetron, Dexamethasone and Treatment may vary due to age or medical condition  Airway Management Planned: Oral ETT  Additional Equipment: ClearSight  Intra-op Plan:   Post-operative Plan: Extubation in OR  Informed Consent: I have reviewed the patients History and Physical, chart, labs and discussed the procedure including the risks, benefits and alternatives for the proposed anesthesia with the patient or authorized representative who has indicated his/her understanding and acceptance.     Dental  advisory given and Consent reviewed with POA  Plan Discussed with: Surgeon and CRNA  Anesthesia Plan Comments: (Pt with Aortic stenosis, anticoagulated.  GETA)         Anesthesia Quick Evaluation

## 2024-01-20 NOTE — Anesthesia Procedure Notes (Signed)
Procedure Name: Intubation Date/Time: 01/20/2024 12:17 PM  Performed by: Georgianne Fick D, CRNAPre-anesthesia Checklist: Patient identified, Emergency Drugs available, Suction available and Patient being monitored Patient Re-evaluated:Patient Re-evaluated prior to induction Oxygen Delivery Method: Circle System Utilized Preoxygenation: Pre-oxygenation with 100% oxygen Induction Type: IV induction Ventilation: Mask ventilation without difficulty Laryngoscope Size: Mac and 3 Grade View: Grade I Tube type: Oral Tube size: 7.0 mm Number of attempts: 1 Airway Equipment and Method: Stylet and Oral airway Placement Confirmation: ETT inserted through vocal cords under direct vision, positive ETCO2 and breath sounds checked- equal and bilateral Secured at: 21 cm Tube secured with: Tape Dental Injury: Teeth and Oropharynx as per pre-operative assessment

## 2024-01-20 NOTE — Interval H&P Note (Signed)
History and Physical Interval Note:  01/20/2024 11:26 AM  Leslie Carr  has presented today for surgery, with the diagnosis of Right Hip Fracture.  The various methods of treatment have been discussed with the patient and family. After consideration of risks, benefits and other options for treatment, the patient has consented to  Procedure(s): INTRAMEDULLARY (IM) NAIL INTERTROCHANTERIC (Right) as a surgical intervention.  The patient's history has been reviewed, patient examined, no change in status, stable for surgery.  I have reviewed the patient's chart and labs.  Questions were answered to the patient's satisfaction.     Caryn Bee P Emmylou Bieker

## 2024-01-20 NOTE — Progress Notes (Signed)
  Echocardiogram 2D Echocardiogram has been performed.  Leslie Carr 01/20/2024, 10:13 AM

## 2024-01-20 NOTE — Progress Notes (Signed)
Orthopedic Tech Progress Note Patient Details:  Leslie Carr 04-02-1931 [id number]  Patient has an order for an OVER HEAD FRAME WITH TRAPEZE, due to patient age she is not able to use frame   Patient ID: Leslie Carr, female   DOB: February 24, 1931, 89 y.o.   MRN: [id number]  Leslie Carr 01/20/2024, 4:13 PM

## 2024-01-20 NOTE — Progress Notes (Signed)
  PROGRESS NOTE  Leslie Carr VHQ:[id number] DOB: 1931/11/15 DOA: 01/18/2024 PCP: Pcp, No  Brief History   ***  A & P  ***  ***  DVT prophylaxis: *** Code Status: *** Family Communication: *** Disposition Plan: ***    Zi Newbury, DO [facility] Direct contact: see [email or web address]  7PM-7AM contact night coverage as above 01/20/2024, 6:07 PM  LOS: 1 day   Consultants  ***  Procedures  ***  Antibiotics  ***  Interval History/Subjective  ***  Objective   Vitals:  Vitals:   01/20/24 1430 01/20/24 1608  BP: (!) 95/51 104/60  Pulse:    Resp: (!) 24 18  Temp: 99.2 F (37.3 C) 98.1 F (36.7 C)  SpO2:  94%    Exam:  Constitutional:  Appears calm and comfortable Eyes:  pupils and irises appear normal Normal lids and conjunctivae ENMT:  grossly normal hearing  Lips appear normal external ears, nose appear normal Oropharynx: mucosa, tongue,posterior pharynx appear normal Neck:  neck appears normal, no masses, normal ROM, supple no thyromegaly Respiratory:  CTA bilaterally, no w/r/r.  Respiratory effort normal. No retractions or accessory muscle use Cardiovascular:  RRR, no m/r/g No LE extremity edema   Normal pedal pulses Abdomen:  Abdomen appears normal; no tenderness or masses No hernias No HSM Musculoskeletal:  Digits/nails BUE: no clubbing, cyanosis, petechiae, infection exam of joints, bones, muscles of at least one of following: head/neck, RUE, LUE, RLE, LLE   strength and tone normal, no atrophy, no abnormal movements No tenderness, masses Normal ROM, no contractures  gait and station Skin:  No rashes, lesions, ulcers palpation of skin: no induration or nodules Neurologic:  CN 2-12 intact Sensation all 4 extremities intact Psychiatric:  Mental status Mood, affect appropriate Orientation to person, place, time  judgment and insight appear intact  ***   I have personally reviewed the following:   Today's Data  ***  Lab  Data  ***  Micro Data  ***  Imaging  ***  Cardiology Data  ***  Other Data  ***  Scheduled Meds:  Chlorhexidine Gluconate Cloth  6 each Topical Q0600   docusate sodium  100 mg Oral BID   fentaNYL       mirtazapine  7.5 mg Oral QHS   pravastatin  40 mg Oral Daily   Continuous Infusions:  sodium chloride 50 mL/hr at 01/20/24 1647    Principal Problem:   Hip fracture (HCC) Active Problems:   Femur fracture, right (HCC)   LOS: 1 day

## 2024-01-21 ENCOUNTER — Encounter (HOSPITAL_COMMUNITY): Payer: Self-pay | Admitting: Anesthesiology

## 2024-01-21 ENCOUNTER — Encounter (HOSPITAL_COMMUNITY): Payer: Self-pay | Admitting: Student

## 2024-01-21 DIAGNOSIS — Z7189 Other specified counseling: Secondary | ICD-10-CM | POA: Diagnosis not present

## 2024-01-21 DIAGNOSIS — I350 Nonrheumatic aortic (valve) stenosis: Secondary | ICD-10-CM

## 2024-01-21 DIAGNOSIS — S72001D Fracture of unspecified part of neck of right femur, subsequent encounter for closed fracture with routine healing: Secondary | ICD-10-CM

## 2024-01-21 DIAGNOSIS — I5022 Chronic systolic (congestive) heart failure: Secondary | ICD-10-CM | POA: Diagnosis not present

## 2024-01-21 DIAGNOSIS — I272 Pulmonary hypertension, unspecified: Secondary | ICD-10-CM

## 2024-01-21 LAB — CBC
HCT: 35 % — ABNORMAL LOW (ref 36.0–46.0)
Hemoglobin: 10.8 g/dL — ABNORMAL LOW (ref 12.0–15.0)
MCH: 26.5 pg (ref 26.0–34.0)
MCHC: 30.9 g/dL (ref 30.0–36.0)
MCV: 86 fL (ref 80.0–100.0)
Platelets: 250 10*3/uL (ref 150–400)
RBC: 4.07 MIL/uL (ref 3.87–5.11)
RDW: 16.4 % — ABNORMAL HIGH (ref 11.5–15.5)
WBC: 13.2 10*3/uL — ABNORMAL HIGH (ref 4.0–10.5)
nRBC: 0 % (ref 0.0–0.2)

## 2024-01-21 LAB — BASIC METABOLIC PANEL
Anion gap: 10 (ref 5–15)
BUN: 28 mg/dL — ABNORMAL HIGH (ref 8–23)
CO2: 23 mmol/L (ref 22–32)
Calcium: 8.6 mg/dL — ABNORMAL LOW (ref 8.9–10.3)
Chloride: 108 mmol/L (ref 98–111)
Creatinine, Ser: 1.42 mg/dL — ABNORMAL HIGH (ref 0.44–1.00)
GFR, Estimated: 35 mL/min — ABNORMAL LOW (ref >60)
Glucose, Bld: 94 mg/dL (ref 70–99)
Potassium: 4.4 mmol/L (ref 3.5–5.1)
Sodium: 141 mmol/L (ref 135–145)

## 2024-01-21 LAB — MAGNESIUM: Magnesium: 2.1 mg/dL (ref 1.7–2.4)

## 2024-01-21 MED ORDER — METHOCARBAMOL 500 MG PO TABS
500.0000 mg | ORAL_TABLET | Freq: Three times a day (TID) | ORAL | Status: DC | PRN
  Administered 2024-01-21 – 2024-01-24 (×4): 500 mg via ORAL
  Filled 2024-01-21 (×5): qty 1

## 2024-01-21 MED ORDER — APIXABAN 2.5 MG PO TABS
2.5000 mg | ORAL_TABLET | Freq: Two times a day (BID) | ORAL | Status: DC
  Administered 2024-01-21 – 2024-01-26 (×11): 2.5 mg via ORAL
  Filled 2024-01-21 (×11): qty 1

## 2024-01-21 MED ORDER — ASPIRIN 81 MG PO TBEC
81.0000 mg | DELAYED_RELEASE_TABLET | Freq: Every day | ORAL | Status: DC
  Administered 2024-01-21 – 2024-01-26 (×6): 81 mg via ORAL
  Filled 2024-01-21 (×6): qty 1

## 2024-01-21 MED ORDER — HALOPERIDOL LACTATE 5 MG/ML IJ SOLN
2.0000 mg | Freq: Four times a day (QID) | INTRAMUSCULAR | Status: DC | PRN
  Administered 2024-01-21 – 2024-01-23 (×3): 2 mg via INTRAVENOUS
  Filled 2024-01-21 (×4): qty 1

## 2024-01-21 NOTE — TOC Initial Note (Signed)
Transition of Care [facility]) - Initial/Assessment Note    Patient Details  Name: Leslie Carr MRN: [id number] Date of Birth: [birth year]  Transition of Care [facility]) CM/SW Contact:    Baldemar Lenis, LCSW Phone Number: 01/21/2024, 4:11 PM  Clinical Narrative:          CSW met with patient's daughter, Clydie Braun, at bedside to discuss recommendation for SNF. Patient's daughter in agreement, the patient is not at a level that she can return to ALF at this time. Daughter lives in [place], would like to check out [facility] as that would be close for her, but she also wants the patient to be in a good facility as her father was somewhere previously that they were not happy with and he ended up dying there, she does not want to go through that experience again. CSW to fax out referral, will follow with bed offers.         Expected Discharge Plan: Skilled Nursing Facility Barriers to Discharge: Continued Medical Work up, English as a second language teacher   Patient Goals and CMS Choice Patient states their goals for this hospitalization and ongoing recovery are:: patient unable to participate in goal setting, not fully oriented CMS Medicare.gov Compare Post Acute Care list provided to:: Patient Represenative (must comment) Choice offered to / list presented to : Adult Children [place] ownership interest in [facility].provided to:: Adult Children    Expected Discharge Plan and Services     Post Acute Care Choice: Skilled Nursing Facility Living arrangements for the past 2 months: Assisted Living Facility                                      Prior Living Arrangements/Services Living arrangements for the past 2 months: Assisted Living Facility Lives with:: Facility Resident Patient language and need for interpreter reviewed:: No Do you feel safe going back to the place where you live?: Yes      Need for Family Participation in Patient Care: Yes (Comment) Care giver support  system in place?: Yes (comment)   Criminal Activity/Legal Involvement Pertinent to Current Situation/Hospitalization: No - Comment as needed  Activities of Daily Living   ADL Screening (condition at time of admission) Independently performs ADLs?: No Does the patient have a NEW difficulty with bathing/dressing/toileting/self-feeding that is expected to last >3 days?: Yes (Initiates electronic notice to provider for possible OT consult) (hip fracture) Does the patient have a NEW difficulty with getting in/out of bed, walking, or climbing stairs that is expected to last >3 days?: Yes (Initiates electronic notice to provider for possible PT consult) (hip fracture) Does the patient have a NEW difficulty with communication that is expected to last >3 days?: No Is the patient deaf or have difficulty hearing?: No Does the patient have difficulty seeing, even when wearing glasses/contacts?: Yes Does the patient have difficulty concentrating, remembering, or making decisions?: Yes  Permission Sought/Granted Permission sought to share information with : Facility Medical sales representative, Family Supports Permission granted to share information with : Yes, Verbal Permission Granted  Share Information with NAME: Clydie Braun  Permission granted to share info w AGENCY: SNF  Permission granted to share info w Relationship: Daughter     Emotional Assessment Appearance:: Appears stated age Attitude/Demeanor/Rapport: Unable to Assess Affect (typically observed): Unable to Assess Orientation: : Oriented to Self Alcohol / Substance Use: Not Applicable Psych Involvement: No (comment)  Admission diagnosis:  Hip fracture (  HCC) [S72.009A] Femur fracture, right (HCC) [S72.91XA] Fall, initial encounter [W19.XXXA] Closed fracture of right hip, initial encounter [facility]) [S72.001A] Patient Active Problem List   Diagnosis Date Noted   Pulmonary hypertension (HCC) 01/21/2024   Hip fracture (HCC) 01/19/2024   Femur  fracture, right (HCC) 01/19/2024   Dementia without behavioral disturbance (HCC) 12/10/2022   COVID-19 virus infection 12/09/2022   TIA (transient ischemic attack) 02/04/2020   Acute metabolic encephalopathy 02/04/2020   Esophagitis    AAA (abdominal aortic aneurysm) (HCC) 04/19/2019   Gastrointestinal hemorrhage    NSTEMI (non-ST elevated myocardial infarction) (HCC) 09/21/2018   Unstable angina (HCC) 09/19/2018   Ascending aortic aneurysm (HCC) 09/19/2018   Chronic diastolic (congestive) heart failure (HCC) 09/19/2018   Coronary artery disease involving native heart without angina pectoris 09/19/2018   CVA (cerebral vascular accident) (HCC) 04/22/2016   PAF (paroxysmal atrial fibrillation) (HCC); CHA2DS2-VASc Score = 7.  Now on DOAC -after recurrent TIA (2021) 04/22/2016   Severe aortic stenosis 04/22/2016   Recurrent pleural effusion on left 04/17/2016   Recurrent left pleural effusion 04/09/2016   Lung nodule, solitary 04/09/2016   Status post CVA 04/09/2016   Hyperlipidemia 04/09/2016   History of breast cancer 04/09/2016   PCP:  Pcp, No Pharmacy:  No Pharmacies Listed    Social Drivers of Health (SDOH) Social History: SDOH Screenings   Food Insecurity: Patient Unable To Answer (01/19/2024)  Housing: Patient Unable To Answer (01/19/2024)  Transportation Needs: Patient Unable To Answer (01/19/2024)  Utilities: Patient Unable To Answer (01/19/2024)  Financial Resource Strain: Low Risk  (09/19/2018)  Physical Activity: Sufficiently Active (09/19/2018)  Social Connections: Unknown (01/19/2024)  Stress: No Stress Concern Present (09/19/2018)  Tobacco Use: Medium Risk (01/20/2024)   SDOH Interventions:     Readmission Risk Interventions    12/11/2022    2:08 PM 12/10/2022   11:30 AM  Readmission Risk Prevention Plan  Transportation Screening Complete Complete  PCP or Specialist Appt within 5-7 Days  Complete  Home Care Screening  Complete  Medication Review (RN CM)   Complete  HRI or Home Care Consult Complete   Social Work Consult for Recovery Care Planning/Counseling Complete   Palliative Care Screening Not Applicable

## 2024-01-21 NOTE — Progress Notes (Signed)
Heart Failure Navigator Progress Note  Assessed for Heart & Vascular TOC clinic readiness.  Patient does not meet criteria due to per MD note patient with Dementia. .   Navigator will sign off at this time.   Rhae Hammock, BSN, Scientist, clinical (histocompatibility and immunogenetics) Only

## 2024-01-21 NOTE — Evaluation (Signed)
Physical Therapy Evaluation  Patient Details Name: Leslie Carr MRN: [id number] DOB: [birth year] Today's Date: 01/21/2024  History of Present Illness  Pt is a 89 y/o female who presents 01/18/2024 s/p unwitnessed fall at ALF. She sustained a R intertrochanteric femur fracture and is now s/p cephalomedullary nailing on 01/20/2024. PMH significant for dementia, breast CA s/p L mastectomy, CHF, CAD, mass of middle lobe R lung, CVA, pleural effusion on L.   Clinical Impression  Pt admitted with above diagnosis. Pt currently with functional limitations due to the deficits listed below (see PT Problem List). At the time of PT eval pt was able to perform transfers with up to +2 max assist. Bed pad utilized for added support under hips to facilitate step-pivot transfer bed>chair. Pt will benefit from post-acute therapy <3 hours/day to maximize functional independence, safety, and return to PLOF. Pt will benefit from acute skilled PT to increase their independence and safety with mobility to allow discharge.           If plan is discharge home, recommend the following: Two people to help with walking and/or transfers;A lot of help with bathing/dressing/bathroom;Assist for transportation;Help with stairs or ramp for entrance;Supervision due to cognitive status   Can travel by private vehicle   No    Equipment Recommendations None recommended by PT (TBD by next venue of care)  Recommendations for Other Services       Functional Status Assessment Patient has had a recent decline in their functional status and demonstrates the ability to make significant improvements in function in a reasonable and predictable amount of time.     Precautions / Restrictions Precautions Precautions: Fall Restrictions Weight Bearing Restrictions Per Provider Order: Yes RLE Weight Bearing Per Provider Order: Weight bearing as tolerated      Mobility  Bed Mobility Overal bed mobility: Needs Assistance Bed  Mobility: Supine to Sit     Supine to sit: Max assist, +2 for physical assistance, HOB elevated, Used rails     General bed mobility comments: Multimodal cues and use of bed pad for transition to EOB. When trunk elevated to full sitting position, pt reports dizziness, stating room is spinning. No nystagmus noted.    Transfers Overall transfer level: Needs assistance Equipment used: 2 person hand held assist Transfers: Sit to/from Stand, Bed to chair/wheelchair/BSC Sit to Stand: Max assist, +2 physical assistance   Step pivot transfers: Max assist, +2 physical assistance       General transfer comment: +2 assist required for power up to full stand. Pt initiated steps around to the recliner chair.    Ambulation/Gait               General Gait Details: Unable to progress to ambulation at this time.  Stairs            Wheelchair Mobility     Tilt Bed    Modified Rankin (Stroke Patients Only)       Balance Overall balance assessment: Needs assistance Sitting-balance support: Feet supported, Bilateral upper extremity supported Sitting balance-Leahy Scale: Poor Sitting balance - Comments: Pt dizzy at EOB and required truncal support to maintain static sitting.   Standing balance support: During functional activity, Bilateral upper extremity supported Standing balance-Leahy Scale: Zero Standing balance comment: +2 required                             Pertinent Vitals/Pain Pain Assessment Pain Assessment: Faces Faces Pain Scale:  Hurts even more Pain Location: R hip Pain Descriptors / Indicators: Operative site guarding, Sore Pain Intervention(s): Limited activity within patient's tolerance, Monitored during session, Repositioned    Home Living Family/patient expects to be discharged to:: Assisted living                 Home Equipment: Rollator (4 wheels);Shower seat;Cane - single point      Prior Function Prior Level of Function :  Needs assist             Mobility Comments: daughter reports she normally uses cane at home, family recommending use of rollator but pt not using it.  tends to leave cane ADLs Comments: able to manage basic ADLs, facility assists with meds, IADLs, and showerse     Extremity/Trunk Assessment   Upper Extremity Assessment Upper Extremity Assessment: LUE deficits/detail LUE Deficits / Details: Frozen shoulder on the L, restricted extremity on the L 2 mastectomy    Lower Extremity Assessment Lower Extremity Assessment: Generalized weakness;RLE deficits/detail RLE Deficits / Details: Acute pain, decreased strength and AROM consistent with above mentioned surgery.    Cervical / Trunk Assessment Cervical / Trunk Assessment: Kyphotic  Communication   Communication Communication: No apparent difficulties Cueing Techniques: Verbal cues;Gestural cues  Cognition Arousal: Alert Behavior During Therapy: Anxious Overall Cognitive Status: History of cognitive impairments - at baseline                                          General Comments      Exercises     Assessment/Plan    PT Assessment Patient needs continued PT services  PT Problem List Decreased strength;Decreased range of motion;Decreased activity tolerance;Decreased balance;Decreased mobility;Decreased knowledge of use of DME;Decreased knowledge of precautions;Decreased safety awareness;Pain       PT Treatment Interventions DME instruction;Gait training;Functional mobility training;Stair training;Therapeutic activities;Therapeutic exercise;Balance training;Patient/family education    PT Goals (Current goals can be found in the Care Plan section)  Acute Rehab PT Goals Patient Stated Goal: Decrease pain PT Goal Formulation: With patient/family Time For Goal Achievement: 02/04/24 Potential to Achieve Goals: Good    Frequency Min 1X/week     Co-evaluation PT/OT/SLP Co-Evaluation/Treatment:  Yes Reason for Co-Treatment: Necessary to address cognition/behavior during functional activity;For patient/therapist safety;To address functional/ADL transfers PT goals addressed during session: Balance;Mobility/safety with mobility;Strengthening/ROM         AM-PAC PT "6 Clicks" Mobility  Outcome Measure Help needed turning from your back to your side while in a flat bed without using bedrails?: Total Help needed moving from lying on your back to sitting on the side of a flat bed without using bedrails?: Total Help needed moving to and from a bed to a chair (including a wheelchair)?: Total Help needed standing up from a chair using your arms (e.g., wheelchair or bedside chair)?: Total Help needed to walk in hospital room?: Total Help needed climbing 3-5 steps with a railing? : Total 6 Click Score: 6    End of Session Equipment Utilized During Treatment: Gait belt;Oxygen Activity Tolerance: Patient tolerated treatment well Patient left: in chair;with call bell/phone within reach;with chair alarm set;with family/visitor present Nurse Communication: Mobility status PT Visit Diagnosis: Unsteadiness on feet (R26.81);Pain Pain - Right/Left: Right Pain - part of body: Hip    Time: 5409-8119 PT Time Calculation (min) (ACUTE ONLY): 37 min   Charges:   PT Evaluation $PT Eval Moderate  Complexity: 1 Mod   PT General Charges $$ ACUTE PT VISIT: 1 Visit         Conni Slipper, PT, DPT Acute Rehabilitation Services Secure Chat Preferred Office: [phone]   Marylynn Pearson 01/21/2024, 1:39 PM

## 2024-01-21 NOTE — Progress Notes (Signed)
Orthopaedic Trauma Progress Note  SUBJECTIVE: Doing okay this morning.  Pain controlled at rest.  Is experiencing some muscle spasms to the right thigh which we discussed can be normal given her injury and surgery.  Muscle relaxer is available as needed.  Patient has not been up out of bed yet since surgery.  Has not had any breakfast this morning but does feel a little hungry.  Denies any significant numbness or tingling throughout the right lower extremity.  No chest pain. No SOB. No nausea/vomiting.  No other issues of note.  OBJECTIVE:  Vitals:   01/21/24 0000 01/21/24 0337  BP: 99/64 (!) 96/53  Pulse: 93 75  Resp: 18 18  Temp: 98.2 F (36.8 C) 98.2 F (36.8 C)  SpO2: 100% 100%    General: Sitting up in bed, no acute distress.  Pleasant and cooperative CV: Notable systolic murmur consistent with aortic stenosis Respiratory: No increased work of breathing.  Right lower extremity: Dressings clean, dry, intact.  Mildly tender over the hip and throughout the thigh as expected.  Not tender over the knee or throughout the calf.  Ankle dorsiflexion/plantarflexion intact.  She is neurovascularly intact.  2+ DP pulse  IMAGING: Stable post op imaging.   LABS: No results found for this or any previous visit (from the past 24 hours).  ASSESSMENT: Leslie Carr is a 89 y.o. female, 1 Day Post-Op s/p INTRAMEDULLARY NAIL RIGHT INTERTROCHANTERIC FEMUR FRACTURE  CV/Blood loss: Hemoglobin 11.6 preoperatively.  CBC pending this a.m.  Hemodynamically stable  PLAN: Weightbearing: WBAT RLE ROM: Okay for hip and knee motion as tolerated Incisional and dressing care: Reinforce dressings as needed  Showering: Okay to begin showering getting incisions wet 01/23/2024 Orthopedic device(s): None  Pain management:  1. Tylenol 650 mg q 6 hours PRN 2. Robaxin 500 mg q 8 hours PRN 3. Oxycodone 5 mg q 4 hours PRN 4. Fentanyl 12.5-50 mcg q 2 hours PRN VTE prophylaxis:  Restart home dose Eliquis today ,  SCDs ID:  Ancef 2gm post op Foley/Lines:  No foley, KVO IVFs Impediments to Fracture Healing: Vitamin D level pending, will start supplementation as indicated Dispo: PT/OT evaluation today, plan is for her to return to SNF at discharge.  Okay for discharge from ortho standpoint once cleared by medicine team and therapies  D/C recommendations: -Oxycodone 2.5 to 5 mg for pain control - home dose Eliquis for DVT prophylaxis - Possible need for  Vit D supplementation  Follow - up plan: 2 weeks after discharge for wound check and repeat x-rays   Contact information:  Truitt Merle MD, Thyra Breed PA-C. After hours and holidays please check [email or web address] for group call information for [facility]   Thompson Caul, PA-C [phone] (office) [email or web address]

## 2024-01-21 NOTE — [place] FL2 (Addendum)
[place] MEDICAID FL2 LEVEL OF CARE FORM     IDENTIFICATION  Patient Name: Leslie Carr Birthdate: Aug 05, 1931 Sex: female Admission Date (Current Location): 01/18/2024  [facility] and [place] Number:  Chiropodist and Address:  [facility], [address], [place], [place] [address]      Provider Number: [id number]  Attending Physician Name and Address:  Almon Hercules, MD  Relative Name and Phone Number:       Current Level of Care: Hospital Recommended Level of Care: Skilled Nursing Facility Prior Approval Number:    Date Approved/Denied:   PASRR Number: [id number]  Discharge Plan: SNF    Current Diagnoses: Patient Active Problem List   Diagnosis Date Noted   Pulmonary hypertension (HCC) 01/21/2024   Hip fracture (HCC) 01/19/2024   Femur fracture, right (HCC) 01/19/2024   Dementia without behavioral disturbance (HCC) 12/10/2022   COVID-19 virus infection 12/09/2022   TIA (transient ischemic attack) 02/04/2020   Acute metabolic encephalopathy 02/04/2020   Esophagitis    AAA (abdominal aortic aneurysm) (HCC) 04/19/2019   Gastrointestinal hemorrhage    NSTEMI (non-ST elevated myocardial infarction) (HCC) 09/21/2018   Unstable angina (HCC) 09/19/2018   Ascending aortic aneurysm (HCC) 09/19/2018   Chronic diastolic (congestive) heart failure (HCC) 09/19/2018   Coronary artery disease involving native heart without angina pectoris 09/19/2018   CVA (cerebral vascular accident) (HCC) 04/22/2016   PAF (paroxysmal atrial fibrillation) (HCC); CHA2DS2-VASc Score = 7.  Now on DOAC -after recurrent TIA (2021) 04/22/2016   Severe aortic stenosis 04/22/2016   Recurrent pleural effusion on left 04/17/2016   Recurrent left pleural effusion 04/09/2016   Lung nodule, solitary 04/09/2016   Status post CVA 04/09/2016   Hyperlipidemia 04/09/2016   History of breast cancer 04/09/2016    Orientation RESPIRATION BLADDER Height & Weight     Self   Normal Continent Weight:   Height:     BEHAVIORAL SYMPTOMS/MOOD NEUROLOGICAL BOWEL NUTRITION STATUS      Continent Diet (Heart)  AMBULATORY STATUS COMMUNICATION OF NEEDS Skin   Total Care Verbally Surgical wounds (Right Thigh)                       Personal Care Assistance Level of Assistance  Feeding, Bathing, Dressing Bathing Assistance: Maximum assistance Feeding assistance: Limited assistance Dressing Assistance: Maximum assistance     Functional Limitations Info  Sight Sight Info: Impaired        SPECIAL CARE FACTORS FREQUENCY  PT (By licensed PT), OT (By licensed OT)     PT Frequency: 5x/week OT Frequency: 5x/week            Contractures      Additional Factors Info  Code Status, Allergies Code Status Info: DNR Allergies Info: Lipitor (Atorvastatin)           Current Medications (01/21/2024):  This is the current hospital active medication list Current Facility-Administered Medications  Medication Dose Route Frequency Provider Last Rate Last Admin   acetaminophen (TYLENOL) tablet 650 mg  650 mg Oral Q6H PRN [place] Bali, PA-C   650 mg at 01/21/24 5621   Or   acetaminophen (TYLENOL) suppository 650 mg  650 mg Rectal Q6H PRN [place], PA-C       apixaban (ELIQUIS) tablet 2.5 mg  2.5 mg Oral BID Candelaria Stagers T, MD   2.5 mg at 01/21/24 1245   aspirin EC tablet 81 mg  81 mg Oral Daily Swayze, Ava,  DO   81 mg at 01/21/24 1245   Chlorhexidine Gluconate Cloth 2 % PADS 6 each  6 each Topical Q0600 [place] Bali, PA-C   6 each at 01/21/24 1018   docusate sodium (COLACE) capsule 100 mg  100 mg Oral BID [place] Bali, PA-C   100 mg at 01/21/24 1610   fentaNYL (SUBLIMAZE) injection 12.5-50 mcg  12.5-50 mcg Intravenous Q2H PRN [place] B[place], PA-C   50 mcg at 01/19/24 9604   haloperidol lactate (HALDOL) injection 2 mg  2 mg Intravenous Q6H PRN Almon Hercules, MD       methocarbamol (ROBAXIN) tablet 500 mg  500 mg Oral Q8H PRN Sharon Seller, Sarah A, PA-C        metoCLOPramide (REGLAN) tablet 5-10 mg  5-10 mg Oral Q8H PRN Sharon Seller, Sarah A, PA-C       Or   metoCLOPramide (REGLAN) injection 5-10 mg  5-10 mg Intravenous Q8H PRN McClung, Sarah A, PA-C       mirtazapine (REMERON) tablet 7.5 mg  7.5 mg Oral QHS Thyra Breed A, PA-C   7.5 mg at 01/20/24 2034   ondansetron (ZOFRAN) tablet 4 mg  4 mg Oral Q6H PRN [place], PA-C       Or   ondansetron (ZOFRAN) injection 4 mg  4 mg Intravenous Q6H PRN Sharon Seller, Sarah A, PA-C       oxyCODONE (Oxy IR/ROXICODONE) immediate release tablet 5 mg  5 mg Oral Q4H PRN [place], PA-C   5 mg at 01/20/24 1609   polyethylene glycol (MIRALAX / GLYCOLAX) packet 17 g  17 g Oral Daily PRN Thyra Breed A, PA-C       pravastatin (PRAVACHOL) tablet 40 mg  40 mg Oral Daily Thyra Breed A, PA-C   40 mg at 01/21/24 5409     Discharge Medications: Please see discharge summary for a list of discharge medications.  Relevant Imaging Results:  Relevant Lab Results:   Additional Information SSN: [id number]  Antion Felipa [employer], Student-Social Work

## 2024-01-21 NOTE — Progress Notes (Signed)
PROGRESS NOTE  Leslie Carr ZOX:[id number] DOB: 1931/05/27   PCP: Pcp, No  Patient is from: ALF  DOA: 01/18/2024 LOS: 2  Chief complaints Chief Complaint  Patient presents with   Fall     Brief Narrative / Interim history: 89 year old F with PMH of dementia, systolic CHF, heart murmur, DVT/A-fib on Eliquis, CAD, HLD and TIA/CVA brought to ED from ALF due to right hip pain after witnessed ground-level fall, and admitted with impacted intertrochanteric fracture of right femur as noted on x-ray.  Orthopedic surgery was consulted and the patient was admitted by [facility]. She was observed to have a 4/6 murmur with radiation to the carotids. Her last echocardiogram was in 2021. It was repeated. The echocardiogram performed on 01/19/2024 demonstrated an EF of 25-30% which represents a large drop from the EF 4 years ago of 60-65%. It also demonstrates a worsening of her AS from midl to moderate in 2021 to severe with a mean gradient of 22 mmHg, peak gradient of 35.4, and aortic valve area of 0.77 cm2.   The patient underwent Cephalomedullary nailing of the right intertrochanteric femur fracture on 01/20/2024. In the PACU the patient had a short period of tachycardia, tachypnea, and increased oxygen requirements. This resolved and she was stable at the time that she left the PACU.    Once in her room she had an episode of severe confusion and agitation. She was given 5 mg Zyprexa IM. This resolved the agitation.   Subjective: Seen and examined earlier this morning.  No major events overnight of this morning.  Has no complaints.  Pain fairly controlled.  She is awake and alert but only oriented to self, family and place.  I met with patient's daughter and granddaughter later in the morning.  We have discussed about echocardiogram finding including severely reduced ejection fraction and severe aortic stenosis.  Given age and dementia family not interested in invasive workup or procedure.   Currently, she has no cardiopulmonary symptoms.  Per daughter, she takes Lasix for leg swelling intermittently.  We have also discussed about CODE STATUS.  Patient's daughter believes DNR/DNI is to patient's best interest given her age and underlying comorbidity.  Objective: Vitals:   01/21/24 0337 01/21/24 0848 01/21/24 0900 01/21/24 0924  BP: (!) 96/53 (!) 65/39  103/62  Pulse: 75   73  Resp: 18     Temp: 98.2 F (36.8 C) (!) 97.3 F (36.3 C) (!) 97.3 F (36.3 C)   TempSrc: Oral Oral Oral   SpO2: 100%       Examination:  GENERAL: No apparent distress.  Nontoxic. HEENT: MMM.  Vision and hearing grossly intact.  NECK: Supple.  No apparent JVD.  RESP:  No IWOB.  Fair aeration bilaterally. CVS: RRR.  3/6 SEM over RUSB and LUSB. ABD/GI/GU: BS+. Abd soft, NTND.  Foley catheter in place. MSK/EXT:  Moves extremities. No apparent deformity. No edema.  Dressing over right hip DCI. SKIN: Dressing over right hip/thigh DCI. NEURO: Awake, alert and oriented to self, place and person.  No apparent focal neuro deficit. PSYCH: Calm. Normal affect.   Procedures:  1/29-Cephalomedullary nailing of the right intertrochanteric femur fracture by Dr. Jena Gauss on 01/20/2024   Microbiology summarized: MRSA PCR screen negative  Assessment and plan: Witnessed ground level fall at assisted living facility Impacted right femur fracture due to fall -S/P cephalomedullary nailing.  -Postop pain management and VTE prophylaxis per surgery.  Already on Eliquis at home. -PT/OT to eval and treat.  Dementia with behavioral disturbance: Had episode of agitation after surgery that has resolved after 1 dose of IM Zyprexa.  Currently awake and alert and oriented to self, person and place. -Reorientation and delirium precaution -Pain management  Chronic systolic CHF/severe PAH: TTE with LVEF of 20 to 25%, GH, G1 DD, severe AAS and RVSP of 60 mmHg.  Discussed this with patient's daughter and granddaughter at  bedside.  DNR trusted in invasive procedure or evaluationPatient has no cardiopulmonary symptoms at the moment.  Appears euvolemic.   -Will use p.o. Lasix as needed -Discontinue IV fluid -Closely monitor fluid and respiratory status. -DNR/DNI.  Severe aortic stenosis: Not a candidate for valve replacement. -Management as above.  Paroxysmal A-fib: Appears to be in sinus rhythm.  Not on rate or rhythm control meds. -Continue Eliquis for anticoagulation  Hyperlipidemia/history of CVA: No residual deficit. -Continue home aspirin and statin.  CKD-3B: Stable Recent Labs    03/26/23 1931 01/19/24 0033 01/19/24 0231 01/20/24 0631 01/21/24 1136  BUN 25* 19 19 20  28*  CREATININE 1.50* 1.41* 1.42* 1.31* 1.42*  -Continue monitoring.  Insomnia Continue Remeron   Hyperlipidemia -Continue Pravastatin   Advance care planning: Patient with onset dementia.  Discussed goal of care with focus on CODE STATUS with patient's daughter and granddaughter at bedside.  Discussed pros and cons of CPR or intubation in light of her age, comorbidity and new echocardiogram finding.  Daughter and granddaughter clearly wants patient to be DNR/DNI which is appropriate.  Also not interested in invasive evaluation for echocardiogram finding -CODE STATUS changed to DNR/DNI -Patient's RN notified.  There is no height or weight on file to calculate BMI.          DVT prophylaxis:  SCDs Start: 01/20/24 1608 SCDs Start: 01/19/24 0128  Code Status: DNR/DNI Family Communication: Updated patient's daughter and granddaughter at bedside Level of care: Med-Surg Status is: Inpatient Remains inpatient appropriate because: Due to right hip fracture   Final disposition: SNF Consultants:  Orthopedic surgery  55 minutes with more than 50% spent in reviewing records, counseling patient/family and coordinating care.   Sch Meds:  Scheduled Meds:  aspirin EC  81 mg Oral Daily   Chlorhexidine Gluconate Cloth  6  each Topical Q0600   docusate sodium  100 mg Oral BID   mirtazapine  7.5 mg Oral QHS   pravastatin  40 mg Oral Daily   Continuous Infusions: PRN Meds:.acetaminophen **OR** acetaminophen, fentaNYL (SUBLIMAZE) injection, methocarbamol, metoCLOPramide **OR** metoCLOPramide (REGLAN) injection, ondansetron **OR** ondansetron (ZOFRAN) IV, oxyCODONE, polyethylene glycol  Antimicrobials: Anti-infectives (From admission, onward)    Start     Dose/Rate Route Frequency Ordered Stop   01/20/24 1259  vancomycin (VANCOCIN) powder  Status:  Discontinued          As needed 01/20/24 1259 01/20/24 1313   01/20/24 0600  ceFAZolin (ANCEF) IVPB 2g/100 mL premix        2 g 200 mL/hr over 30 Minutes Intravenous On call to O.R. 01/19/24 1346 01/20/24 1222        I have personally reviewed the following labs and images: CBC: Recent Labs  Lab 01/19/24 0033 01/19/24 0358 01/20/24 0631 01/21/24 1136  WBC 13.0* 11.4* 9.4 13.2*  NEUTROABS 11.6*  --  8.8*  --   HGB 11.2* 10.0* 11.6* 10.8*  HCT 36.5 32.2* 36.2 35.0*  MCV 86.7 85.6 85.4 86.0  PLT 245 234 244 250   BMP &GFR Recent Labs  Lab 01/19/24 0033 01/19/24 0231 01/20/24 0631  NA 137  136 140  K 4.0 4.3 4.0  CL 105 104 107  CO2 21* 19* 20*  GLUCOSE 135* 176* 139*  BUN 19 19 20   CREATININE 1.41* 1.42* 1.31*  CALCIUM 8.4* 8.2* 8.7*   CrCl cannot be calculated (Unknown ideal weight.). Liver & Pancreas: No results for input(s): "AST", "ALT", "ALKPHOS", "BILITOT", "PROT", "ALBUMIN" in the last 168 hours. No results for input(s): "LIPASE", "AMYLASE" in the last 168 hours. No results for input(s): "AMMONIA" in the last 168 hours. Diabetic: No results for input(s): "HGBA1C" in the last 72 hours. No results for input(s): "GLUCAP" in the last 168 hours. Cardiac Enzymes: No results for input(s): "CKTOTAL", "CKMB", "CKMBINDEX", "TROPONINI" in the last 168 hours. No results for input(s): "PROBNP" in the last 8760 hours. Coagulation Profile: No  results for input(s): "INR", "PROTIME" in the last 168 hours. Thyroid Function Tests: No results for input(s): "TSH", "T4TOTAL", "FREET4", "T3FREE", "THYROIDAB" in the last 72 hours. Lipid Profile: No results for input(s): "CHOL", "HDL", "LDLCALC", "TRIG", "CHOLHDL", "LDLDIRECT" in the last 72 hours. Anemia Panel: No results for input(s): "VITAMINB12", "FOLATE", "FERRITIN", "TIBC", "IRON", "RETICCTPCT" in the last 72 hours. Urine analysis:    Component Value Date/Time   COLORURINE YELLOW 12/08/2022 1917   APPEARANCEUR CLEAR 12/08/2022 1917   LABSPEC 1.017 12/08/2022 1917   PHURINE 5.0 12/08/2022 1917   GLUCOSEU NEGATIVE 12/08/2022 1917   HGBUR MODERATE (A) 12/08/2022 1917   BILIRUBINUR NEGATIVE 12/08/2022 1917   KETONESUR 20 (A) 12/08/2022 1917   PROTEINUR NEGATIVE 12/08/2022 1917   NITRITE NEGATIVE 12/08/2022 1917   LEUKOCYTESUR SMALL (A) 12/08/2022 1917   Sepsis Labs: Invalid input(s): "PROCALCITONIN", "LACTICIDVEN"  Microbiology: Recent Results (from the past 240 hours)  Surgical pcr screen     Status: Abnormal   Collection Time: 01/19/24  2:07 PM   Specimen: Nasal Mucosa; Nasal Swab  Result Value Ref Range Status   MRSA, PCR NEGATIVE NEGATIVE Final   Staphylococcus aureus POSITIVE (A) NEGATIVE Final    Comment: (NOTE) The Xpert SA Assay (FDA approved for NASAL specimens in patients 3 years of age and older), is one component of a comprehensive surveillance program. It is not intended to diagnose infection nor to guide or monitor treatment. Performed at [facility], [address] [place], [place] [address]     Radiology Studies: DG FEMUR, [place] 2 VIEWS RIGHT Result Date: 01/20/2024 CLINICAL DATA:  Elective surgery. EXAM: RIGHT FEMUR 2 VIEWS COMPARISON:  Preoperative imaging. FINDINGS: Five fluoroscopic spot views of the right proximal femur obtained in the operating room. Femoral intramedullary nail with trans trochanteric and distal locking screw fixation  traverse proximal femur fracture. Fluoroscopy time 50.2 seconds. Dose 5.26 mGy. IMPRESSION: Procedural fluoroscopy during right proximal femur fracture fixation. Electronically Signed   By: Narda Rutherford M.D.   On: 01/20/2024 15:59   DG HIP PORT UNILAT W OR W/O PELVIS 1V RIGHT Result Date: 01/20/2024 CLINICAL DATA:  Status post right femoral intramedullary nail. EXAM: DG HIP (WITH OR WITHOUT PELVIS) 1V PORT RIGHT COMPARISON:  Intraoperative fluoroscopy right hip 01/20/2024, pelvis and right hip radiographs 01/18/2024, CT right hip 01/19/2024 FINDINGS: There is diffuse decreased bone mineralization. Interval cephalomedullary nail fixation of the previously seen intertrochanteric fracture of the proximal right femur. Improved, now normal alignment. Mild persistent fracture line lucency extending through the superior aspect of the greater trochanter. No perihardware lucency is seen to indicate hardware failure or loosening. Minimal superomedial bilateral femoroacetabular joint space narrowing. Moderate pubic symphysis joint space narrowing. Expected postoperative subcutaneous  air within the lateral right hip and thigh. Moderate atherosclerotic calcifications. Vascular phleboliths overlie the pelvis. IMPRESSION: Interval cephalomedullary nail fixation of the previously seen intertrochanteric fracture of the proximal right femur. Improved, now normal alignment. No evidence of hardware failure. Electronically Signed   By: Neita Garnet M.D.   On: 01/20/2024 14:46   DG C-Arm 1-60 Min-No Report Result Date: 01/20/2024 Fluoroscopy was utilized by the requesting physician.  No radiographic interpretation.      Cobain Morici T. Elzada [employer] [facility]  If 7PM-7AM, please contact night-coverage [email or web address] 01/21/2024, 12:14 PM

## 2024-01-21 NOTE — Evaluation (Signed)
Occupational Therapy Evaluation Patient Details Name: Leslie Carr MRN: [id number] DOB: 1931/12/05 Today's Date: 01/21/2024   History of Present Illness Pt is a 89 y/o female who presents 01/18/2024 s/p unwitnessed fall at ALF. She sustained a R intertrochanteric femur fracture and is now s/p cephalomedullary nailing on 01/20/2024. PMH significant for dementia, breast CA s/p L mastectomy, CHF, CAD, mass of middle lobe R lung, CVA, pleural effusion on L.   Clinical Impression   PTA patient independent with ADLs, using cane for functional mobility; facility staff assisting with medications and meals, as well as showers.  Admitted for above and presents with problem list below.  She requires max assist +2 for bed mobility and transfers, min to total assist for ADLs.  Pt oriented to self only, requires redirection and is easily distracted; but known hx of dementia. Based on performance today, believe patient will best benefit from continued OT services acutely and after dc at inpatient setting with <3hrs/day to optimize independence, safety with ADLs and mobility.        If plan is discharge home, recommend the following: Two people to help with walking and/or transfers;Two people to help with bathing/dressing/bathroom;Direct supervision/assist for medications management;Direct supervision/assist for financial management;Assist for transportation;Help with stairs or ramp for entrance;Supervision due to cognitive status    Functional Status Assessment  Patient has had a recent decline in their functional status and demonstrates the ability to make significant improvements in function in a reasonable and predictable amount of time.  Equipment Recommendations  Other (comment) (defer)    Recommendations for Other Services       Precautions / Restrictions Precautions Precautions: Fall Restrictions Weight Bearing Restrictions Per Provider Order: Yes RLE Weight Bearing Per Provider Order: Weight  bearing as tolerated      Mobility Bed Mobility Overal bed mobility: Needs Assistance Bed Mobility: Supine to Sit     Supine to sit: Max assist, +2 for physical assistance, HOB elevated, Used rails     General bed mobility comments: Multimodal cues and use of bed pad for transition to EOB. When trunk elevated to full sitting position, pt reports dizziness, stating room is spinning. No nystagmus noted.    Transfers Overall transfer level: Needs assistance Equipment used: 2 person hand held assist Transfers: Sit to/from Stand, Bed to chair/wheelchair/BSC Sit to Stand: Max assist, +2 physical assistance     Step pivot transfers: Max assist, +2 physical assistance     General transfer comment: +2 assist required for power up to full stand. Pt initiated steps around to the recliner chair.      Balance Overall balance assessment: Needs assistance Sitting-balance support: Feet supported, Bilateral upper extremity supported Sitting balance-Leahy Scale: Poor Sitting balance - Comments: Pt dizzy at EOB and required truncal support to maintain static sitting.   Standing balance support: During functional activity, Bilateral upper extremity supported Standing balance-Leahy Scale: Zero Standing balance comment: +2 required                           ADL either performed or assessed with clinical judgement   ADL Overall ADL's : Needs assistance/impaired     Grooming: Minimal assistance;Sitting           Upper Body Dressing : Minimal assistance;Sitting   Lower Body Dressing: Total assistance;+2 for physical assistance;Sit to/from stand   Toilet Transfer: Maximal assistance;+2 for physical assistance Toilet Transfer Details (indicate cue type and reason): to recliner, bil hand held support  Functional mobility during ADLs: Maximal assistance;+2 for physical assistance       Vision   Vision Assessment?: No apparent visual deficits Additional  Comments: not formally assessed     Perception         Praxis         Pertinent Vitals/Pain Pain Assessment Pain Assessment: Faces Faces Pain Scale: Hurts even more Pain Location: R hip Pain Descriptors / Indicators: Operative site guarding, Sore Pain Intervention(s): Limited activity within patient's tolerance, Monitored during session, Repositioned     Extremity/Trunk Assessment Upper Extremity Assessment Upper Extremity Assessment: Right hand dominant;LUE deficits/detail LUE Deficits / Details: Frozen shoulder on the L, restricted extremity on the L 2 mastectomy LUE Coordination: decreased gross motor   Lower Extremity Assessment Lower Extremity Assessment: Defer to PT evaluation RLE Deficits / Details: Acute pain, decreased strength and AROM consistent with above mentioned surgery.   Cervical / Trunk Assessment Cervical / Trunk Assessment: Kyphotic   Communication Communication Communication: No apparent difficulties Cueing Techniques: Verbal cues;Gestural cues;Tactile cues   Cognition Arousal: Alert Behavior During Therapy: Anxious Overall Cognitive Status: History of cognitive impairments - at baseline                                 General Comments: pt oriented to self, easily distrated and requires cueing for safety. very fearful of falling.     General Comments  family at side and supportive    Exercises     Shoulder Instructions      Home Living Family/patient expects to be discharged to:: Assisted living                             Home Equipment: Rollator (4 wheels);Shower seat;Cane - single point          Prior Functioning/Environment Prior Level of Function : Needs assist             Mobility Comments: daughter reports she normally uses cane at home, family recommending use of rollator but pt not using it.  tends to leave cane ADLs Comments: able to manage basic ADLs, facility assists with meds, IADLs, and  showers        OT Problem List: Decreased strength;Decreased activity tolerance;Impaired balance (sitting and/or standing);Pain;Decreased knowledge of precautions;Decreased knowledge of use of DME or AE;Cardiopulmonary status limiting activity;Decreased cognition;Decreased safety awareness      OT Treatment/Interventions: Therapeutic exercise;Self-care/ADL training;DME and/or AE instruction;Therapeutic activities;Cognitive remediation/compensation;Balance training;Patient/family education    OT Goals(Current goals can be found in the care plan section) Acute Rehab OT Goals Patient Stated Goal: get better OT Goal Formulation: With patient/family Time For Goal Achievement: 02/04/24 Potential to Achieve Goals: Fair  OT Frequency: Min 1X/week    Co-evaluation PT/OT/SLP Co-Evaluation/Treatment: Yes Reason for Co-Treatment: Necessary to address cognition/behavior during functional activity;For patient/therapist safety;To address functional/ADL transfers PT goals addressed during session: Balance;Mobility/safety with mobility;Strengthening/ROM OT goals addressed during session: ADL's and self-care      AM-PAC OT "6 Clicks" Daily Activity     Outcome Measure Help from another person eating meals?: A Little Help from another person taking care of personal grooming?: A Little Help from another person toileting, which includes using toliet, bedpan, or urinal?: Total Help from another person bathing (including washing, rinsing, drying)?: A Lot Help from another person to put on and taking off regular upper body clothing?: A Little Help from another  person to put on and taking off regular lower body clothing?: Total 6 Click Score: 13   End of Session Equipment Utilized During Treatment: Gait belt;Rolling walker (2 wheels);Oxygen Nurse Communication: Mobility status;Need for lift equipment;Precautions  Activity Tolerance: Patient tolerated treatment well Patient left: in chair;with call  bell/phone within reach;with chair alarm set;with family/visitor present  OT Visit Diagnosis: Other abnormalities of gait and mobility (R26.89);Muscle weakness (generalized) (M62.81);Pain;Other symptoms and signs involving cognitive function Pain - Right/Left: Right Pain - part of body: Hip;Leg                Time: 4098-1191 OT Time Calculation (min): 36 min Charges:  OT General Charges $OT Visit: 1 Visit OT Evaluation $OT Eval Moderate Complexity: 1 Mod  Barry Brunner, OT Acute Rehabilitation Services Office [phone]   Chancy Milroy 01/21/2024, 1:54 PM

## 2024-01-21 NOTE — Progress Notes (Signed)
DNR bracelet placed on left arm per MD order, daughter present at bedside and confirmed. Leslie Carr is confused and per daughter that is baseline prior to admission.

## 2024-01-22 DIAGNOSIS — I480 Paroxysmal atrial fibrillation: Secondary | ICD-10-CM

## 2024-01-22 DIAGNOSIS — Z7189 Other specified counseling: Secondary | ICD-10-CM | POA: Diagnosis not present

## 2024-01-22 DIAGNOSIS — I63019 Cerebral infarction due to thrombosis of unspecified vertebral artery: Secondary | ICD-10-CM

## 2024-01-22 DIAGNOSIS — S72001D Fracture of unspecified part of neck of right femur, subsequent encounter for closed fracture with routine healing: Secondary | ICD-10-CM | POA: Diagnosis not present

## 2024-01-22 DIAGNOSIS — I350 Nonrheumatic aortic (valve) stenosis: Secondary | ICD-10-CM | POA: Diagnosis not present

## 2024-01-22 DIAGNOSIS — I5022 Chronic systolic (congestive) heart failure: Secondary | ICD-10-CM | POA: Diagnosis not present

## 2024-01-22 LAB — RENAL FUNCTION PANEL
Albumin: 2.5 g/dL — ABNORMAL LOW (ref 3.5–5.0)
Anion gap: 8 (ref 5–15)
BUN: 30 mg/dL — ABNORMAL HIGH (ref 8–23)
CO2: 21 mmol/L — ABNORMAL LOW (ref 22–32)
Calcium: 8 mg/dL — ABNORMAL LOW (ref 8.9–10.3)
Chloride: 107 mmol/L (ref 98–111)
Creatinine, Ser: 1.43 mg/dL — ABNORMAL HIGH (ref 0.44–1.00)
GFR, Estimated: 35 mL/min — ABNORMAL LOW (ref >60)
Glucose, Bld: 107 mg/dL — ABNORMAL HIGH (ref 70–99)
Phosphorus: 2.6 mg/dL (ref 2.5–4.6)
Potassium: 3.8 mmol/L (ref 3.5–5.1)
Sodium: 136 mmol/L (ref 135–145)

## 2024-01-22 LAB — CBC
HCT: 34.5 % — ABNORMAL LOW (ref 36.0–46.0)
Hemoglobin: 11 g/dL — ABNORMAL LOW (ref 12.0–15.0)
MCH: 27 pg (ref 26.0–34.0)
MCHC: 31.9 g/dL (ref 30.0–36.0)
MCV: 84.6 fL (ref 80.0–100.0)
Platelets: 232 10*3/uL (ref 150–400)
RBC: 4.08 MIL/uL (ref 3.87–5.11)
RDW: 16.4 % — ABNORMAL HIGH (ref 11.5–15.5)
WBC: 9.6 10*3/uL (ref 4.0–10.5)
nRBC: 0 % (ref 0.0–0.2)

## 2024-01-22 LAB — MAGNESIUM: Magnesium: 2 mg/dL (ref 1.7–2.4)

## 2024-01-22 LAB — VITAMIN D 25 HYDROXY (VIT D DEFICIENCY, FRACTURES): Vit D, 25-Hydroxy: 25.75 ng/mL — ABNORMAL LOW (ref 30–100)

## 2024-01-22 MED ORDER — ACETAMINOPHEN 325 MG PO TABS
650.0000 mg | ORAL_TABLET | Freq: Four times a day (QID) | ORAL | Status: DC
  Administered 2024-01-22 – 2024-01-26 (×17): 650 mg via ORAL
  Filled 2024-01-22 (×17): qty 2

## 2024-01-22 MED ORDER — OXYCODONE HCL 5 MG PO TABS: 2.5000 mg | ORAL_TABLET | ORAL | 0 refills | Status: AC | PRN

## 2024-01-22 MED ORDER — LACTATED RINGERS IV BOLUS
250.0000 mL | Freq: Once | INTRAVENOUS | Status: AC
  Administered 2024-01-22: 250 mL via INTRAVENOUS

## 2024-01-22 MED ORDER — VITAMIN D 25 MCG (1000 UNIT) PO TABS
2000.0000 [IU] | ORAL_TABLET | Freq: Every day | ORAL | Status: DC
  Administered 2024-01-22 – 2024-01-26 (×5): 2000 [IU] via ORAL
  Filled 2024-01-22 (×5): qty 2

## 2024-01-22 MED ORDER — METHOCARBAMOL 500 MG PO TABS: 500.0000 mg | ORAL_TABLET | Freq: Three times a day (TID) | ORAL | 0 refills | Status: AC | PRN

## 2024-01-22 MED ORDER — FENTANYL CITRATE PF 50 MCG/ML IJ SOSY
25.0000 ug | PREFILLED_SYRINGE | INTRAMUSCULAR | Status: DC | PRN
  Administered 2024-01-24 – 2024-01-25 (×3): 25 ug via INTRAVENOUS
  Filled 2024-01-22 (×3): qty 1

## 2024-01-22 MED ORDER — OXYCODONE HCL 5 MG PO TABS
5.0000 mg | ORAL_TABLET | Freq: Four times a day (QID) | ORAL | Status: DC | PRN
Start: 2024-01-22 — End: 2024-01-26
  Administered 2024-01-23 – 2024-01-24 (×2): 5 mg via ORAL
  Filled 2024-01-22 (×2): qty 1

## 2024-01-22 NOTE — Progress Notes (Signed)
PROGRESS NOTE  Leslie Carr WRU:[id number] DOB: 06/11/1931   PCP: Pcp, No  Patient is from: ALF  DOA: 01/18/2024 LOS: 3  Chief complaints Chief Complaint  Patient presents with   Fall     Brief Narrative / Interim history: 89 year old F with PMH of dementia, systolic CHF, heart murmur, DVT/A-fib on Eliquis, CAD, HLD and TIA/CVA brought to ED from ALF due to right hip pain after witnessed ground-level fall, and admitted with impacted intertrochanteric fracture of right femur as noted on x-ray.  Orthopedic surgery was consulted and the patient was admitted by [facility]. She was observed to have a 4/6 murmur with radiation to the carotids. Her last echocardiogram was in 2021. It was repeated. The echocardiogram performed on 01/19/2024 demonstrated an EF of 25-30% which represents a large drop from the EF 4 years ago of 60-65%. It also demonstrates a worsening of her AS from midl to moderate in 2021 to severe with a mean gradient of 22 mmHg, peak gradient of 35.4, and aortic valve area of 0.77 cm2.   The patient underwent Cephalomedullary nailing of the right intertrochanteric femur fracture on 01/20/2024. In the PACU the patient had a short period of tachycardia, tachypnea, and increased oxygen requirements. This resolved and she was stable at the time that she left the PACU.    Patient has had intermittent confusion and agitation.  She required Zyprexa on 1/29 and Haldol on 1/30.  Subjective: Seen and examined earlier this morning.  No major events overnight of this morning.  Has bilateral mittens.  She is awake but groggy.  She is oriented to self and "hospital".  She follows commands.  She is asking for her clothes  Objective: Vitals:   01/21/24 2005 01/22/24 0605 01/22/24 0844 01/22/24 0852  BP: 97/77 91/61 (!) 92/59 (!) 119/96  Pulse: 64 95 (!) 108 (!) 53  Resp: 20 20 10    Temp: 98 F (36.7 C) 98.4 F (36.9 C) 98.1 F (36.7 C)   TempSrc: Oral Oral Oral   SpO2: 94%  97% 92% 94%    Examination:  GENERAL: No apparent distress.  Nontoxic. HEENT: MMM.  Vision and hearing grossly intact.  NECK: Supple.  No apparent JVD.  RESP:  No IWOB.  Fair aeration bilaterally. CVS: RRR.  3/6 SEM over RUSB and LUSB. ABD/GI/GU: BS+. Abd soft, NTND.  Foley catheter in place. MSK/EXT:  Moves extremities. No apparent deformity. No edema.  Dressing over right hip DCI. SKIN: Dressing over right hip/thigh DCI. NEURO: Awake but not quite alert.  Somewhat groggy.  Oriented to self and "hospital".  Moves extremities.  No apparent focal neuro deficit. PSYCH: Calm. Normal affect.   Procedures:  1/29-Cephalomedullary nailing of the right intertrochanteric femur fracture by Dr. Jena Gauss on 01/20/2024   Microbiology summarized: MRSA PCR screen negative  Assessment and plan: Witnessed ground level fall at assisted living facility Impacted right femur fracture due to fall -S/P cephalomedullary nailing.  -Scheduled Tylenol  -Use Robaxin and opiate as needed.  -Already on Eliquis which would serve VTE prophylaxis -PT/OT to eval and treat.   Acute metabolic encephalopathy/dementia with behavioral disturbance: Has had intermittent confusion and agitation.  Required Zyprexa on 1/29 and Haldol on 1/30.  Now with bilateral mittens.  She is awake but not alert.  She is somewhat groggy.  Oriented to self and "hospital". -Adjusted pain medication as above. -Reorientation and delirium precaution -Pain management  Chronic systolic CHF/severe PAH: TTE with LVEF of 20 to 25%, GH, G1 DD,  severe AAS and RVSP of 60 mmHg.  Discussed this with patient's daughter and granddaughter at bedside.  DNR trusted in invasive procedure or evaluationPatient has no cardiopulmonary symptoms at the moment.  Appears a little dry today. -Will give gentle bolus 250 cc x 1.  Will redose as appropriate in the afternoon -Closely monitor fluid and respiratory status. -DNR/DNI.  Severe aortic stenosis: Not a  candidate for valve replacement. -Management as above.  Paroxysmal A-fib: Appears to be in sinus rhythm.  Not on rate or rhythm control meds. -Continue Eliquis for anticoagulation  Hyperlipidemia/history of CVA: No residual deficit. -Continue home aspirin and statin.  CKD-3B: Relatively stable. Recent Labs    03/26/23 1931 01/19/24 0033 01/19/24 0231 01/20/24 0631 01/21/24 1136 01/22/24 0557  BUN 25* 19 19 20  28* 30*  CREATININE 1.50* 1.41* 1.42* 1.31* 1.42* 1.43*  -Continue monitoring. -LR bolus as above  Insomnia Continue Remeron   Hyperlipidemia -Continue Pravastatin   Advance care planning: DNR/DNI. -See discussion from 1/30.  There is no height or weight on file to calculate BMI.          DVT prophylaxis:  apixaban (ELIQUIS) tablet 2.5 mg Start: 01/21/24 1330 SCDs Start: 01/20/24 1608 SCDs Start: 01/19/24 0128 apixaban (ELIQUIS) tablet 2.5 mg  Code Status: DNR/DNI Family Communication: None at bedside today Level of care: Med-Surg Status is: Inpatient Remains inpatient appropriate because: Due to right hip fracture and encephalopathy   Final disposition: SNF Consultants:  Orthopedic surgery  35 minutes with more than 50% spent in reviewing records, counseling patient/family and coordinating care.   Sch Meds:  Scheduled Meds:  acetaminophen  650 mg Oral Q6H WA   apixaban  2.5 mg Oral BID   aspirin EC  81 mg Oral Daily   Chlorhexidine Gluconate Cloth  6 each Topical Q0600   cholecalciferol  2,000 Units Oral Daily   mirtazapine  7.5 mg Oral QHS   pravastatin  40 mg Oral Daily   Continuous Infusions:  lactated ringers     PRN Meds:.fentaNYL (SUBLIMAZE) injection, haloperidol lactate, methocarbamol, metoCLOPramide **OR** metoCLOPramide (REGLAN) injection, ondansetron **OR** ondansetron (ZOFRAN) IV, oxyCODONE, polyethylene glycol  Antimicrobials: Anti-infectives (From admission, onward)    Start     Dose/Rate Route Frequency Ordered Stop    01/20/24 1259  vancomycin (VANCOCIN) powder  Status:  Discontinued          As needed 01/20/24 1259 01/20/24 1313   01/20/24 0600  ceFAZolin (ANCEF) IVPB 2g/100 mL premix        2 g 200 mL/hr over 30 Minutes Intravenous On call to O.R. 01/19/24 1346 01/20/24 1222        I have personally reviewed the following labs and images: CBC: Recent Labs  Lab 01/19/24 0033 01/19/24 0358 01/20/24 0631 01/21/24 1136 01/22/24 0557  WBC 13.0* 11.4* 9.4 13.2* 9.6  NEUTROABS 11.6*  --  8.8*  --   --   HGB 11.2* 10.0* 11.6* 10.8* 11.0*  HCT 36.5 32.2* 36.2 35.0* 34.5*  MCV 86.7 85.6 85.4 86.0 84.6  PLT 245 234 244 250 232   BMP &GFR Recent Labs  Lab 01/19/24 0033 01/19/24 0231 01/20/24 0631 01/21/24 1136 01/22/24 0557  NA 137 136 140 141 136  K 4.0 4.3 4.0 4.4 3.8  CL 105 104 107 108 107  CO2 21* 19* 20* 23 21*  GLUCOSE 135* 176* 139* 94 107*  BUN 19 19 20  28* 30*  CREATININE 1.41* 1.42* 1.31* 1.42* 1.43*  CALCIUM 8.4* 8.2* 8.7* 8.6* 8.0*  MG  --   --   --  2.1 2.0  PHOS  --   --   --   --  2.6   CrCl cannot be calculated (Unknown ideal weight.). Liver & Pancreas: Recent Labs  Lab 01/22/24 0557  ALBUMIN 2.5*   No results for input(s): "LIPASE", "AMYLASE" in the last 168 hours. No results for input(s): "AMMONIA" in the last 168 hours. Diabetic: No results for input(s): "HGBA1C" in the last 72 hours. No results for input(s): "GLUCAP" in the last 168 hours. Cardiac Enzymes: No results for input(s): "CKTOTAL", "CKMB", "CKMBINDEX", "TROPONINI" in the last 168 hours. No results for input(s): "PROBNP" in the last 8760 hours. Coagulation Profile: No results for input(s): "INR", "PROTIME" in the last 168 hours. Thyroid Function Tests: No results for input(s): "TSH", "T4TOTAL", "FREET4", "T3FREE", "THYROIDAB" in the last 72 hours. Lipid Profile: No results for input(s): "CHOL", "HDL", "LDLCALC", "TRIG", "CHOLHDL", "LDLDIRECT" in the last 72 hours. Anemia Panel: No results for  input(s): "VITAMINB12", "FOLATE", "FERRITIN", "TIBC", "IRON", "RETICCTPCT" in the last 72 hours. Urine analysis:    Component Value Date/Time   COLORURINE YELLOW 12/08/2022 1917   APPEARANCEUR CLEAR 12/08/2022 1917   LABSPEC 1.017 12/08/2022 1917   PHURINE 5.0 12/08/2022 1917   GLUCOSEU NEGATIVE 12/08/2022 1917   HGBUR MODERATE (A) 12/08/2022 1917   BILIRUBINUR NEGATIVE 12/08/2022 1917   KETONESUR 20 (A) 12/08/2022 1917   PROTEINUR NEGATIVE 12/08/2022 1917   NITRITE NEGATIVE 12/08/2022 1917   LEUKOCYTESUR SMALL (A) 12/08/2022 1917   Sepsis Labs: Invalid input(s): "PROCALCITONIN", "LACTICIDVEN"  Microbiology: Recent Results (from the past 240 hours)  Surgical pcr screen     Status: Abnormal   Collection Time: 01/19/24  2:07 PM   Specimen: Nasal Mucosa; Nasal Swab  Result Value Ref Range Status   MRSA, PCR NEGATIVE NEGATIVE Final   Staphylococcus aureus POSITIVE (A) NEGATIVE Final    Comment: (NOTE) The Xpert SA Assay (FDA approved for NASAL specimens in patients 65 years of age and older), is one component of a comprehensive surveillance program. It is not intended to diagnose infection nor to guide or monitor treatment. Performed at [facility], [address], [place], [place] [address]     Radiology Studies: No results found.     Juanito Gonyer T. Letia Guidry [facility]  If 7PM-7AM, please contact night-coverage [email or web address] 01/22/2024, 12:23 PM

## 2024-01-22 NOTE — Progress Notes (Cosign Needed Addendum)
Orthopaedic Trauma Progress Note  SUBJECTIVE: Doing okay this morning.  Is slightly disoriented, wants to get out of the bed to go to the bathroom. Patient easily re-directed. Pain controlled at rest. Denies any significant numbness or tingling throughout the right lower extremity.  No chest pain. Denies SOB. No nausea/vomiting.  No other issues of note.  OBJECTIVE:  Vitals:   01/22/24 0844 01/22/24 0852  BP: (!) 92/59 (!) 119/96  Pulse: (!) 108 (!) 53  Resp: 10   Temp: 98.1 F (36.7 C)   SpO2: 92% 94%    General: Sitting up in bed, no acute distress.  Alert  and oriented to person CV: Notable murmur consistent with aortic stenosis Respiratory: Slight increased work of breathing.  Right lower extremity: Dressings clean, dry, intact.  Mildly tender over the hip and throughout the thigh as expected.  Not tender over the knee or throughout the calf.  Ankle dorsiflexion/plantarflexion intact.  She is neurovascularly intact.  2+ DP pulse  IMAGING: Stable post op imaging.   LABS:  Results for orders placed or performed during the hospital encounter of 01/18/24 (from the past 24 hours)  Basic metabolic panel     Status: Abnormal   Collection Time: 01/21/24 11:36 AM  Result Value Ref Range   Sodium 141 135 - 145 mmol/L   Potassium 4.4 3.5 - 5.1 mmol/L   Chloride 108 98 - 111 mmol/L   CO2 23 22 - 32 mmol/L   Glucose, Bld 94 70 - 99 mg/dL   BUN 28 (H) 8 - 23 mg/dL   Creatinine, Ser 4.09 (H) 0.44 - 1.00 mg/dL   Calcium 8.6 (L) 8.9 - 10.3 mg/dL   GFR, Estimated 35 (L) >60 mL/min   Anion gap 10 5 - 15  CBC     Status: Abnormal   Collection Time: 01/21/24 11:36 AM  Result Value Ref Range   WBC 13.2 (H) 4.0 - 10.5 K/uL   RBC 4.07 3.87 - 5.11 MIL/uL   Hemoglobin 10.8 (L) 12.0 - 15.0 g/dL   HCT 81.1 (L) 91.4 - 78.2 %   MCV 86.0 80.0 - 100.0 fL   MCH 26.5 26.0 - 34.0 pg   MCHC 30.9 30.0 - 36.0 g/dL   RDW 95.6 (H) 21.3 - 08.6 %   Platelets 250 150 - 400 K/uL   nRBC 0.0 0.0 - 0.2 %   Magnesium     Status: None   Collection Time: 01/21/24 11:36 AM  Result Value Ref Range   Magnesium 2.1 1.7 - 2.4 mg/dL  CBC     Status: Abnormal   Collection Time: 01/22/24  5:57 AM  Result Value Ref Range   WBC 9.6 4.0 - 10.5 K/uL   RBC 4.08 3.87 - 5.11 MIL/uL   Hemoglobin 11.0 (L) 12.0 - 15.0 g/dL   HCT 57.8 (L) 46.9 - 62.9 %   MCV 84.6 80.0 - 100.0 fL   MCH 27.0 26.0 - 34.0 pg   MCHC 31.9 30.0 - 36.0 g/dL   RDW 52.8 (H) 41.3 - 24.4 %   Platelets 232 150 - 400 K/uL   nRBC 0.0 0.0 - 0.2 %  VITAMIN D 25 Hydroxy (Vit-D Deficiency, Fractures)     Status: Abnormal   Collection Time: 01/22/24  5:57 AM  Result Value Ref Range   Vit D, 25-Hydroxy 25.75 (L) 30 - 100 ng/mL  Magnesium     Status: None   Collection Time: 01/22/24  5:57 AM  Result Value Ref Range  Magnesium 2.0 1.7 - 2.4 mg/dL  Renal function panel     Status: Abnormal   Collection Time: 01/22/24  5:57 AM  Result Value Ref Range   Sodium 136 135 - 145 mmol/L   Potassium 3.8 3.5 - 5.1 mmol/L   Chloride 107 98 - 111 mmol/L   CO2 21 (L) 22 - 32 mmol/L   Glucose, Bld 107 (H) 70 - 99 mg/dL   BUN 30 (H) 8 - 23 mg/dL   Creatinine, Ser 7.82 (H) 0.44 - 1.00 mg/dL   Calcium 8.0 (L) 8.9 - 10.3 mg/dL   Phosphorus 2.6 2.5 - 4.6 mg/dL   Albumin 2.5 (L) 3.5 - 5.0 g/dL   GFR, Estimated 35 (L) >60 mL/min   Anion gap 8 5 - 15    ASSESSMENT: Leslie Carr is a 89 y.o. female, 2 Days Post-Op s/p INTRAMEDULLARY NAIL RIGHT INTERTROCHANTERIC FEMUR FRACTURE  CV/Blood loss: Hemoglobin 11.0 this AM. Stable over the last 24 hours.   PLAN: Weightbearing: WBAT RLE ROM: Okay for hip and knee motion as tolerated Incisional and dressing care: Change PRN Showering: Okay to begin showering getting incisions wet 01/23/2024 Orthopedic device(s): None  Pain management:  1. Tylenol 650 mg q 6 hours PRN 2. Robaxin 500 mg q 8 hours PRN 3. Oxycodone 5 mg q 4 hours PRN 4. Fentanyl 12.5-50 mcg q 2 hours PRN VTE prophylaxis:  Eliquis ,  SCDs ID:  Ancef 2gm post op Foley/Lines:  No foley, KVO IVFs Impediments to Fracture Healing: Vitamin D level 25, started on supplementation  Dispo: PT/OT evaluation ongoing, plan is for her to return to SNF at discharge.  Okay for discharge from ortho standpoint once cleared by medicine team and therapies. D/c rx for pain medication and muscle relaxer have been placed in patient's chart  D/C recommendations: -Oxycodone 2.5 to 5 mg for pain control - home dose Eliquis for DVT prophylaxis - Continue 2000 units daily Vit D supplementation x 90 days  Follow - up plan: 2 weeks after discharge for wound check and repeat x-rays   Contact information:  Truitt Merle MD, Thyra Breed PA-C. After hours and holidays please check [email or web address] for group call information for [facility]   Thompson Caul, PA-C [phone] (office) [email or web address]

## 2024-01-22 NOTE — TOC Progression Note (Signed)
Transition of Care [facility]) - Progression Note    Patient Details  Name: MARIGNY BORRE MRN: [id number] Date of Birth: Nov 05, 1931  Transition of Care [facility]) CM/SW Contact  Baldemar Len[place], [place] Phone Number: 01/22/2024, 3:30 PM  Clinical Narrative:   CSW met with patient's daughter, Clydie Braun, to discuss placement. Clydie Braun has a tour scheduled with [facility] for Monday morning, and CSW provided her with other bed offers, as well. Referral for [facility] is still pending, CSW contacted [facility] to ask them to review. CSW answered questions and will continue to follow.    Expected Discharge Plan: Skilled Nursing Facility Barriers to Discharge: Continued Medical Work up, English as a second language teacher  Expected Discharge Plan and Services     Post Acute Care Choice: Skilled Nursing Facility Living arrangements for the past 2 months: Assisted Living Facility                                       Social Determinants of Health (SDOH) Interventions SDOH Screenings   Food Insecurity: Patient Unable To Answer (01/19/2024)  Housing: Patient Unable To Answer (01/19/2024)  Transportation Needs: Patient Unable To Answer (01/19/2024)  Utilities: Patient Unable To Answer (01/19/2024)  Financial Resource Strain: Low Risk  (09/19/2018)  Physical Activity: Sufficiently Active (09/19/2018)  Social Connections: Unknown (01/19/2024)  Stress: No Stress Concern Present (09/19/2018)  Tobacco Use: Medium Risk (01/20/2024)    Readmission Risk Interventions    12/11/2022    2:08 PM 12/10/2022   11:30 AM  Readmission Risk Prevention Plan  Transportation Screening Complete Complete  PCP or Specialist Appt within 5-7 Days  Complete  Home Care Screening  Complete  Medication Review (RN CM)  Complete  HRI or Home Care Consult Complete   Social Work Consult for Recovery Care Planning/Counseling Complete   Palliative Care Screening Not Applicable

## 2024-01-22 NOTE — Care Management Important Message (Signed)
Important Message  Patient Details  Name: Leslie Carr MRN: [id number] Date of Birth: 05-27-1931   Important Message Given:  Yes - Medicare IM     Dorena Bodo 01/22/2024, 2:48 PM

## 2024-01-23 DIAGNOSIS — I350 Nonrheumatic aortic (valve) stenosis: Secondary | ICD-10-CM | POA: Diagnosis not present

## 2024-01-23 DIAGNOSIS — I5022 Chronic systolic (congestive) heart failure: Secondary | ICD-10-CM | POA: Diagnosis not present

## 2024-01-23 DIAGNOSIS — Z7189 Other specified counseling: Secondary | ICD-10-CM | POA: Diagnosis not present

## 2024-01-23 DIAGNOSIS — S72001D Fracture of unspecified part of neck of right femur, subsequent encounter for closed fracture with routine healing: Secondary | ICD-10-CM | POA: Diagnosis not present

## 2024-01-23 LAB — CBC
HCT: 31.6 % — ABNORMAL LOW (ref 36.0–46.0)
Hemoglobin: 10.1 g/dL — ABNORMAL LOW (ref 12.0–15.0)
MCH: 27.1 pg (ref 26.0–34.0)
MCHC: 32 g/dL (ref 30.0–36.0)
MCV: 84.7 fL (ref 80.0–100.0)
Platelets: 227 10*3/uL (ref 150–400)
RBC: 3.73 MIL/uL — ABNORMAL LOW (ref 3.87–5.11)
RDW: 16.3 % — ABNORMAL HIGH (ref 11.5–15.5)
WBC: 7.5 10*3/uL (ref 4.0–10.5)
nRBC: 0 % (ref 0.0–0.2)

## 2024-01-23 LAB — MAGNESIUM: Magnesium: 2.1 mg/dL (ref 1.7–2.4)

## 2024-01-23 LAB — RENAL FUNCTION PANEL
Albumin: 2.2 g/dL — ABNORMAL LOW (ref 3.5–5.0)
Anion gap: 8 (ref 5–15)
BUN: 26 mg/dL — ABNORMAL HIGH (ref 8–23)
CO2: 23 mmol/L (ref 22–32)
Calcium: 8 mg/dL — ABNORMAL LOW (ref 8.9–10.3)
Chloride: 109 mmol/L (ref 98–111)
Creatinine, Ser: 1.22 mg/dL — ABNORMAL HIGH (ref 0.44–1.00)
GFR, Estimated: 42 mL/min — ABNORMAL LOW (ref >60)
Glucose, Bld: 92 mg/dL (ref 70–99)
Phosphorus: 2.8 mg/dL (ref 2.5–4.6)
Potassium: 3.8 mmol/L (ref 3.5–5.1)
Sodium: 140 mmol/L (ref 135–145)

## 2024-01-23 NOTE — Progress Notes (Signed)
PROGRESS NOTE  Leslie Carr QMV:[id number] DOB: 14-Nov-1931   PCP: Pcp, No  Patient is from: ALF  DOA: 01/18/2024 LOS: 4  Chief complaints Chief Complaint  Patient presents with   Fall     Brief Narrative / Interim history: 89 year old F with PMH of dementia, systolic CHF, heart murmur, DVT/A-fib on Eliquis, CAD, HLD and TIA/CVA brought to ED from ALF due to right hip pain after witnessed ground-level fall, and admitted with impacted intertrochanteric fracture of right femur as noted on x-ray.  Orthopedic surgery was consulted and the patient was admitted by [facility]. She was observed to have a 4/6 murmur with radiation to the carotids. Her last echocardiogram was in 2021. It was repeated. The echocardiogram performed on 01/19/2024 demonstrated an EF of 25-30% which represents a large drop from the EF 4 years ago of 60-65%. It also demonstrates a worsening of her AS from midl to moderate in 2021 to severe with a mean gradient of 22 mmHg, peak gradient of 35.4, and aortic valve area of 0.77 cm2.   The patient underwent Cephalomedullary nailing of the right intertrochanteric femur fracture on 01/20/2024. In the PACU the patient had a short period of tachycardia, tachypnea, and increased oxygen requirements. This resolved and she was stable at the time that she left the PACU.    Patient has had intermittent confusion and agitation.  She required Zyprexa on 1/29 and Haldol on 1/30.  Currently stable for discharge pending insurance authorization and SNF bed.  Subjective: Seen and examined earlier this morning.  No major events overnight of this morning.  Awake and alert but only oriented to self and "hospital".  Has no complaints.  She denies pain.   Objective: Vitals:   01/22/24 1237 01/22/24 1500 01/22/24 1930 01/23/24 0723  BP: 108/65 113/80 110/68 115/65  Pulse: 69 (!) 111  87  Resp: 12 17 18    Temp: 98.5 F (36.9 C) 98 F (36.7 C) 98.6 F (37 C) (!) 97.5 F (36.4 C)   TempSrc: Oral Oral Oral Oral  SpO2: 95% 92% 94% 92%    Examination:  GENERAL: No apparent distress.  Nontoxic. HEENT: MMM.  Vision and hearing grossly intact.  NECK: Supple.  No apparent JVD.  RESP:  No IWOB.  Fair aeration bilaterally. CVS: RRR.  3/6 SEM over RUSB and LUSB. ABD/GI/GU: BS+. Abd soft, NTND.  MSK/EXT:  Moves extremities. No apparent deformity. No edema.  Dressing over right hip DCI. SKIN: Dressing over right hip/thigh DCI. NEURO: Awake and alert.  Oriented to self and "hospital".  Moves extremities.  No apparent focal neuro deficit. PSYCH: Calm. Normal affect.   Procedures:  1/29-Cephalomedullary nailing of the right intertrochanteric femur fracture by Dr. Jena Gauss on 01/20/2024   Microbiology summarized: MRSA PCR screen negative  Assessment and plan: Witnessed ground level fall at assisted living facility Impacted right femur fracture due to fall -S/P cephalomedullary nailing.  -Scheduled Tylenol  -Use Robaxin and opiate as needed.  -Already on Eliquis which would serve VTE prophylaxis -PT/OT to eval and treat.   Acute metabolic encephalopathy/dementia with behavioral disturbance: Has had intermittent confusion and agitation.  Required Zyprexa on 1/29 and Haldol on 1/30.  Awake and alert but only oriented to self and "hospital". -Adjusted pain medication as above. -Reorientation and delirium precaution -Pain management  Chronic systolic CHF/severe PAH: TTE with LVEF of 20 to 25%, GH, G1 DD, severe AAS and RVSP of 60 mmHg.  Discussed this with patient's daughter and granddaughter at bedside.  DNR trusted in invasive procedure or evaluationPatient has no cardiopulmonary symptoms at the moment.   -Monitor off IV fluid. -DNR/DNI.  Severe aortic stenosis: Not a candidate for valve replacement. -Management as above.  Paroxysmal A-fib: Appears to be in sinus rhythm.  Not on rate or rhythm control meds. -Continue Eliquis for  anticoagulation  Hyperlipidemia/history of CVA: No residual deficit. -Continue home aspirin and statin.  CKD-3B: Improved after small IV bolus on 1/31. Recent Labs    03/26/23 1931 01/19/24 0033 01/19/24 0231 01/20/24 0631 01/21/24 1136 01/22/24 0557 01/23/24 0442  BUN 25* 19 19 20  28* 30* 26*  CREATININE 1.50* 1.41* 1.42* 1.31* 1.42* 1.43* 1.22*  -Continue monitoring.  Insomnia Continue Remeron   Hyperlipidemia -Continue Pravastatin   Advance care planning: DNR/DNI. -See discussion from 1/30.  There is no height or weight on file to calculate BMI.          DVT prophylaxis:  apixaban (ELIQUIS) tablet 2.5 mg Start: 01/21/24 1330 SCDs Start: 01/20/24 1608 SCDs Start: 01/19/24 0128 apixaban (ELIQUIS) tablet 2.5 mg  Code Status: DNR/DNI Family Communication: None at bedside today Level of care: Med-Surg Status is: Inpatient Remains inpatient appropriate because: Safe disposition/SNF   Final disposition: SNF Consultants:  Orthopedic surgery  35 minutes with more than 50% spent in reviewing records, counseling patient/family and coordinating care.   Sch Meds:  Scheduled Meds:  acetaminophen  650 mg Oral Q6H WA   apixaban  2.5 mg Oral BID   aspirin EC  81 mg Oral Daily   Chlorhexidine Gluconate Cloth  6 each Topical Q0600   cholecalciferol  2,000 Units Oral Daily   mirtazapine  7.5 mg Oral QHS   pravastatin  40 mg Oral Daily   Continuous Infusions:   PRN Meds:.fentaNYL (SUBLIMAZE) injection, haloperidol lactate, methocarbamol, metoCLOPramide **OR** metoCLOPramide (REGLAN) injection, ondansetron **OR** ondansetron (ZOFRAN) IV, oxyCODONE, polyethylene glycol  Antimicrobials: Anti-infectives (From admission, onward)    Start     Dose/Rate Route Frequency Ordered Stop   01/20/24 1259  vancomycin (VANCOCIN) powder  Status:  Discontinued          As needed 01/20/24 1259 01/20/24 1313   01/20/24 0600  ceFAZolin (ANCEF) IVPB 2g/100 mL premix        2 g 200  mL/hr over 30 Minutes Intravenous On call to O.R. 01/19/24 1346 01/20/24 1222        I have personally reviewed the following labs and images: CBC: Recent Labs  Lab 01/19/24 0033 01/19/24 0358 01/20/24 0631 01/21/24 1136 01/22/24 0557 01/23/24 0442  WBC 13.0* 11.4* 9.4 13.2* 9.6 7.5  NEUTROABS 11.6*  --  8.8*  --   --   --   HGB 11.2* 10.0* 11.6* 10.8* 11.0* 10.1*  HCT 36.5 32.2* 36.2 35.0* 34.5* 31.6*  MCV 86.7 85.6 85.4 86.0 84.6 84.7  PLT 245 234 244 250 232 227   BMP &GFR Recent Labs  Lab 01/19/24 0231 01/20/24 0631 01/21/24 1136 01/22/24 0557 01/23/24 0442  NA 136 140 141 136 140  K 4.3 4.0 4.4 3.8 3.8  CL 104 107 108 107 109  CO2 19* 20* 23 21* 23  GLUCOSE 176* 139* 94 107* 92  BUN 19 20 28* 30* 26*  CREATININE 1.42* 1.31* 1.42* 1.43* 1.22*  CALCIUM 8.2* 8.7* 8.6* 8.0* 8.0*  MG  --   --  2.1 2.0 2.1  PHOS  --   --   --  2.6 2.8   CrCl cannot be calculated (Unknown ideal weight.). Liver &  Pancreas: Recent Labs  Lab 01/22/24 0557 01/23/24 0442  ALBUMIN 2.5* 2.2*   No results for input(s): "LIPASE", "AMYLASE" in the last 168 hours. No results for input(s): "AMMONIA" in the last 168 hours. Diabetic: No results for input(s): "HGBA1C" in the last 72 hours. No results for input(s): "GLUCAP" in the last 168 hours. Cardiac Enzymes: No results for input(s): "CKTOTAL", "CKMB", "CKMBINDEX", "TROPONINI" in the last 168 hours. No results for input(s): "PROBNP" in the last 8760 hours. Coagulation Profile: No results for input(s): "INR", "PROTIME" in the last 168 hours. Thyroid Function Tests: No results for input(s): "TSH", "T4TOTAL", "FREET4", "T3FREE", "THYROIDAB" in the last 72 hours. Lipid Profile: No results for input(s): "CHOL", "HDL", "LDLCALC", "TRIG", "CHOLHDL", "LDLDIRECT" in the last 72 hours. Anemia Panel: No results for input(s): "VITAMINB12", "FOLATE", "FERRITIN", "TIBC", "IRON", "RETICCTPCT" in the last 72 hours. Urine analysis:    Component  Value Date/Time   COLORURINE YELLOW 12/08/2022 1917   APPEARANCEUR CLEAR 12/08/2022 1917   LABSPEC 1.017 12/08/2022 1917   PHURINE 5.0 12/08/2022 1917   GLUCOSEU NEGATIVE 12/08/2022 1917   HGBUR MODERATE (A) 12/08/2022 1917   BILIRUBINUR NEGATIVE 12/08/2022 1917   KETONESUR 20 (A) 12/08/2022 1917   PROTEINUR NEGATIVE 12/08/2022 1917   NITRITE NEGATIVE 12/08/2022 1917   LEUKOCYTESUR SMALL (A) 12/08/2022 1917   Sepsis Labs: Invalid input(s): "PROCALCITONIN", "LACTICIDVEN"  Microbiology: Recent Results (from the past 240 hours)  Surgical pcr screen     Status: Abnormal   Collection Time: 01/19/24  2:07 PM   Specimen: Nasal Mucosa; Nasal Swab  Result Value Ref Range Status   MRSA, PCR NEGATIVE NEGATIVE Final   Staphylococcus aureus POSITIVE (A) NEGATIVE Final    Comment: (NOTE) The Xpert SA Assay (FDA approved for NASAL specimens in patients 75 years of age and older), is one component of a comprehensive surveillance program. It is not intended to diagnose infection nor to guide or monitor treatment. Performed at [facility], [address] [place], [place] [address]     Radiology Studies: No results found.     Aliviana Burdell T. Patryk Conant [facility]  If 7PM-7AM, please contact night-coverage [email or web address] 01/23/2024, 2:04 PM

## 2024-01-23 NOTE — Plan of Care (Signed)
  Problem: Safety: Goal: Non-violent Restraint(s) Outcome: Completed/Met   Problem: Education: Goal: Knowledge of General Education information will improve Description: Including pain rating scale, medication(s)/side effects and non-pharmacologic comfort measures Outcome: Not Progressing   Problem: Activity: Goal: Risk for activity intolerance will decrease Outcome: Progressing   Problem: Nutrition: Goal: Adequate nutrition will be maintained Outcome: Progressing

## 2024-01-24 ENCOUNTER — Inpatient Hospital Stay (HOSPITAL_COMMUNITY): Payer: Medicare Other

## 2024-01-24 DIAGNOSIS — F0390 Unspecified dementia without behavioral disturbance: Secondary | ICD-10-CM | POA: Diagnosis not present

## 2024-01-24 DIAGNOSIS — I480 Paroxysmal atrial fibrillation: Secondary | ICD-10-CM | POA: Diagnosis not present

## 2024-01-24 DIAGNOSIS — S72001D Fracture of unspecified part of neck of right femur, subsequent encounter for closed fracture with routine healing: Secondary | ICD-10-CM | POA: Diagnosis not present

## 2024-01-24 DIAGNOSIS — I272 Pulmonary hypertension, unspecified: Secondary | ICD-10-CM | POA: Diagnosis not present

## 2024-01-24 MED ORDER — BISACODYL 10 MG RE SUPP
10.0000 mg | Freq: Once | RECTAL | Status: AC
  Administered 2024-01-24: 10 mg via RECTAL
  Filled 2024-01-24: qty 1

## 2024-01-24 MED ORDER — HALOPERIDOL LACTATE 5 MG/ML IJ SOLN
1.0000 mg | Freq: Once | INTRAMUSCULAR | Status: AC
  Administered 2024-01-24: 1 mg via INTRAVENOUS
  Filled 2024-01-24: qty 1

## 2024-01-24 MED ORDER — MAGNESIUM HYDROXIDE 400 MG/5ML PO SUSP: 30.0000 mL | Freq: Every day | ORAL | Status: DC | PRN

## 2024-01-24 MED ORDER — ENSURE ENLIVE PO LIQD
237.0000 mL | Freq: Two times a day (BID) | ORAL | Status: DC
  Administered 2024-01-25 – 2024-01-26 (×4): 237 mL via ORAL

## 2024-01-24 MED ORDER — HYDROXYZINE HCL 25 MG PO TABS
25.0000 mg | ORAL_TABLET | Freq: Three times a day (TID) | ORAL | Status: DC | PRN
  Administered 2024-01-24: 25 mg via ORAL
  Filled 2024-01-24: qty 1

## 2024-01-24 MED ORDER — HALOPERIDOL 1 MG PO TABS: 1.0000 mg | ORAL_TABLET | Freq: Three times a day (TID) | ORAL | Status: DC | PRN

## 2024-01-24 NOTE — Progress Notes (Signed)
[facility]                                                                               Leslie Carr, is a 89 y.o. female, DOB - 26-Aug-1931, HYQ:[id number] Admit date - 01/18/2024    Outpatient Primary MD for the patient is Pcp, No  LOS - 5  days    Brief summary    89 year old F with PMH of dementia, systolic CHF, heart murmur, DVT/A-fib on Eliquis, CAD, HLD and TIA/CVA brought to ED from ALF due to right hip pain after witnessed ground-level fall, and admitted with impacted intertrochanteric fracture of right femur as noted on x-ray.  Orthopedic surgery was consulted and the patient was admitted by [facility]. She was observed to have a 4/6 murmur with radiation to the carotids. Her last echocardiogram was in 2021. It was repeated. The echocardiogram performed on 01/19/2024 demonstrated an EF of 25-30% which represents a large drop from  4 years ago  with EF  of 60-65%. It also demonstrates a worsening of her AS.    The patient underwent Cephalomedullary nailing of the right intertrochanteric femur fracture on 01/20/2024. In the PACU the patient had a short period of tachycardia, tachypnea, and increased oxygen requirements. This resolved and she was stable at the time that she left the PACU.    Patient has had intermittent confusion and agitation.  She required Zyprexa on 1/29 and Haldol on 1/30.  Currently stable for discharge pending insurance authorization and SNF bed.    Assessment & Plan    Assessment and Plan:  Fall with right femur fracture S/p cephalomedullary nailing of the right femur Pain control and on Eliquis.  Therapy evaluations recommending SNF.    Acute metabolic encephalopathy in the setting of underlying dementia with behavioral abnormalities Patient is alert and oriented to self only at this time Patient requires frequent reorientation with delirium precautions Patient has intermittent confusion with agitation.   Chronic systolic  congestive heart failure with severe pulmonary artery hypertension Discussed with patient's family. Outpatient follow-up with cardiology as recommended.   Severe aortic stenosis Unfortunately not a candidate for valve replacement.    Paroxysmal atrial fibrillation Continue with Eliquis for anticoagulation    History of CVA Continue with aspirin and statin.   Normocytic anemia Hemoglobin between 10-11 and stable continue to monitor    Stage IIIb CKD Creatinine appears to be at baseline    Estimated body mass index is 23.03 kg/m as calculated from the following:   Height as of 03/26/23: 5\' 3"  (1.6 m).   Weight as of 03/26/23: 59 kg.  Code Status: DNR  DVT Prophylaxis:  apixaban (ELIQUIS) tablet 2.5 mg Start: 01/21/24 1330 SCDs Start: 01/20/24 1608 SCDs Start: 01/19/24 0128 apixaban (ELIQUIS) tablet 2.5 mg   Level of Care: Level of care: Med-Surg Family Communication: none at bedside.   Disposition Plan:     Remains inpatient appropriate:  waiting for SNF.   Procedures:  Cephalomedullary nailing of the right intertrochanteric femur fracture on 01/20/2024.  Consultants:   ORTHOPEDICS   Antimicrobials:   Anti-infectives (From admission, onward)    Start     Dose/Rate Route Frequency Ordered  Stop   01/20/24 1259  vancomycin (VANCOCIN) powder  Status:  Discontinued          As needed 01/20/24 1259 01/20/24 1313   01/20/24 0600  ceFAZolin (ANCEF) IVPB 2g/100 mL premix        2 g 200 mL/hr over 30 Minutes Intravenous On call to O.R. 01/19/24 1346 01/20/24 1222        Medications  Scheduled Meds:  acetaminophen  650 mg Oral Q6H WA   apixaban  2.5 mg Oral BID   aspirin EC  81 mg Oral Daily   Chlorhexidine Gluconate Cloth  6 each Topical Q0600   cholecalciferol  2,000 Units Oral Daily   mirtazapine  7.5 mg Oral QHS   pravastatin  40 mg Oral Daily   Continuous Infusions: PRN Meds:.fentaNYL (SUBLIMAZE) injection, haloperidol lactate, methocarbamol,  metoCLOPramide **OR** metoCLOPramide (REGLAN) injection, ondansetron **OR** ondansetron (ZOFRAN) IV, oxyCODONE, polyethylene glycol    Subjective:   Leslie Carr was seen and examined today.  Remains confused, no new complaints at this time  Objective:   Vitals:   01/22/24 1500 01/22/24 1930 01/23/24 0723 01/23/24 1420  BP: 113/80 110/68 115/65 98/65  Pulse: (!) 111  87 84  Resp: 17 18    Temp: 98 F (36.7 C) 98.6 F (37 C) (!) 97.5 F (36.4 C) (!) 97.3 F (36.3 C)  TempSrc: Oral Oral Oral Oral  SpO2: 92% 94% 92% 93%    Intake/Output Summary (Last 24 hours) at 01/24/2024 0900 Last data filed at 01/24/2024 0006 Gross per 24 hour  Intake --  Output 800 ml  Net -800 ml   There were no vitals filed for this visit.   Exam General exam: Appears calm and comfortable  Respiratory system: Clear to auscultation. Respiratory effort normal. Cardiovascular system: S1 & S2 heard, RRR. No JVD,  Gastrointestinal system: Abdomen is nondistended, soft and nontender.  Central nervous system: Alert and oriented To self Skin: No rashes, Psychiatry: Restless    Data Reviewed:  I have personally reviewed following labs and imaging studies   CBC Lab Results  Component Value Date   WBC 7.5 01/23/2024   RBC 3.73 (L) 01/23/2024   HGB 10.1 (L) 01/23/2024   HCT 31.6 (L) 01/23/2024   MCV 84.7 01/23/2024   MCH 27.1 01/23/2024   PLT 227 01/23/2024   MCHC 32.0 01/23/2024   RDW 16.3 (H) 01/23/2024   LYMPHSABS 0.1 (L) 01/20/2024   MONOABS 0.4 01/20/2024   EOSABS 0.0 01/20/2024   BASOSABS 0.0 01/20/2024     Last metabolic panel Lab Results  Component Value Date   NA 140 01/23/2024   K 3.8 01/23/2024   CL 109 01/23/2024   CO2 23 01/23/2024   BUN 26 (H) 01/23/2024   CREATININE 1.22 (H) 01/23/2024   GLUCOSE 92 01/23/2024   GFRNONAA 42 (L) 01/23/2024   GFRAA 35 (L) 02/25/2020   CALCIUM 8.0 (L) 01/23/2024   PHOS 2.8 01/23/2024   PROT 6.7 12/25/2022   ALBUMIN 2.2 (L) 01/23/2024    LABGLOB 2.8 01/09/2020   AGRATIO 1.5 01/09/2020   BILITOT 0.6 12/25/2022   ALKPHOS 64 12/25/2022   AST 16 12/25/2022   ALT 11 12/25/2022   ANIONGAP 8 01/23/2024    CBG (last 3)  No results for input(s): "GLUCAP" in the last 72 hours.    Coagulation Profile: No results for input(s): "INR", "PROTIME" in the last 168 hours.   Radiology Studies: No results found.     Kathlen Mody M.D. [facility]  01/24/2024, 9:00 AM  Available via Epic secure chat 7am-7pm After 7 pm, please refer to night coverage provider listed on amion.

## 2024-01-25 DIAGNOSIS — Z7189 Other specified counseling: Secondary | ICD-10-CM | POA: Diagnosis not present

## 2024-01-25 DIAGNOSIS — S72001D Fracture of unspecified part of neck of right femur, subsequent encounter for closed fracture with routine healing: Secondary | ICD-10-CM | POA: Diagnosis not present

## 2024-01-25 DIAGNOSIS — I350 Nonrheumatic aortic (valve) stenosis: Secondary | ICD-10-CM | POA: Diagnosis not present

## 2024-01-25 DIAGNOSIS — I5022 Chronic systolic (congestive) heart failure: Secondary | ICD-10-CM | POA: Diagnosis not present

## 2024-01-25 MED ORDER — LACTATED RINGERS IV BOLUS
250.0000 mL | INTRAVENOUS | Status: AC
  Administered 2024-01-25 (×2): 250 mL via INTRAVENOUS

## 2024-01-25 NOTE — Progress Notes (Signed)
PROGRESS NOTE  Leslie Carr UJW:[id number] DOB: 1931-09-04   PCP: Pcp, No  Patient is from: ALF  DOA: 01/18/2024 LOS: 6  Chief complaints Chief Complaint  Patient presents with   Fall     Brief Narrative / Interim history: 89 year old F with PMH of dementia, systolic CHF, heart murmur, DVT/A-fib on Eliquis, CAD, HLD and TIA/CVA brought to ED from ALF due to right hip pain after witnessed ground-level fall, and admitted with impacted intertrochanteric fracture of right femur as noted on x-ray.  Orthopedic surgery was consulted and the patient was admitted by [facility]. She was observed to have a 4/6 murmur with radiation to the carotids. Her last echocardiogram was in 2021. It was repeated. The echocardiogram performed on 01/19/2024 demonstrated an EF of 25-30% which represents a large drop from the EF 4 years ago of 60-65%. It also demonstrates a worsening of her AS from midl to moderate in 2021 to severe with a mean gradient of 22 mmHg, peak gradient of 35.4, and aortic valve area of 0.77 cm2.   The patient underwent Cephalomedullary nailing of the right intertrochanteric femur fracture on 01/20/2024. In the PACU the patient had a short period of tachycardia, tachypnea, and increased oxygen requirements. This resolved and she was stable at the time that she left the PACU.    Patient has had intermittent confusion and agitation.  She required Zyprexa on 1/29 and Haldol on 1/30.  Currently stable for discharge pending insurance authorization and SNF bed.  Subjective: Seen and examined earlier this morning.  No major events overnight of this morning.  Soft blood pressure this morning but does not seem to be symptomatic.  She is not a great historian either.  Sleepy but wakes to voice.  Only oriented to self.  Follows commands.  Objective: Vitals:   01/24/24 0900 01/24/24 1300 01/24/24 2125 01/25/24 0735  BP: 127/69 103/63 (!) 94/58 (!) 87/64  Pulse: 87 79 88 79  Resp:  17 18    Temp: 97.7 F (36.5 C) 98.3 F (36.8 C) 98.4 F (36.9 C) 97.9 F (36.6 C)  TempSrc: Oral Oral Axillary Oral  SpO2: 93% 97% 94% 95%    Examination:  GENERAL: No apparent distress.  Nontoxic. HEENT: MMM.  Vision and hearing grossly intact.  NECK: Supple.  No apparent JVD.  RESP:  No IWOB.  Fair aeration bilaterally. CVS: RRR.  3/6 SEM over RUSB and LUSB. ABD/GI/GU: BS+. Abd soft, NTND.  MSK/EXT:  Moves extremities. No apparent deformity. No edema.  Dressing over right hip DCI. SKIN: Dressing over right hip/thigh DCI. NEURO: Sleepy but wakes to voice.  Oriented to self.  Follows commands.  Moves extremities.  No apparent focal neuro deficit. PSYCH: Calm. Normal affect.   Procedures:  1/29-Cephalomedullary nailing of the right intertrochanteric femur fracture by Dr. Jena Gauss on 01/20/2024   Microbiology summarized: MRSA PCR screen negative  Assessment and plan: Witnessed ground level fall at assisted living facility Impacted right femur fracture due to fall -S/P cephalomedullary nailing.  -Scheduled Tylenol  -Use Robaxin and opiate as needed.  -Already on Eliquis which would serve VTE prophylaxis -PT/OT to eval and treat.   Acute metabolic encephalopathy/dementia with behavioral disturbance: Has had intermittent confusion and agitation.  Required Zyprexa on 1/29 and Haldol on 1/30.  Sleepy this morning.  She wakes to voice but only oriented to self.  Follows commands. -Adjusted pain medication as above. -Reorientation and delirium precaution -Pain management  Chronic systolic CHF/severe PAH: TTE with LVEF of  20 to 25%, GH, G1 DD, severe AAS and RVSP of 60 mmHg.  Discussed this with patient's daughter and granddaughter at bedside.  DNR trusted in invasive procedure or evaluationPatient has no cardiopulmonary symptoms at the moment.   -Monitor off IV fluid. -DNR/DNI.  Severe aortic stenosis: Not a candidate for valve replacement. -Management as above.  Paroxysmal A-fib:  Appears to be in sinus rhythm.  Not on rate or rhythm control meds. -Continue Eliquis for anticoagulation  Hyperlipidemia/history of CVA: No residual deficit. -Continue home aspirin and statin.  CKD-3B: Improved after small IV bolus on 1/31. Recent Labs    03/26/23 1931 01/19/24 0033 01/19/24 0231 01/20/24 0631 01/21/24 1136 01/22/24 0557 01/23/24 0442  BUN 25* 19 19 20  28* 30* 26*  CREATININE 1.50* 1.41* 1.42* 1.31* 1.42* 1.43* 1.22*  -Continue monitoring.  Hypotension: Mild.  Not on antihypertensive meds.  Does not seem to be symptomatic but not a great historian. -Will give LR bolus 250 cc x 2.  Insomnia Continue Remeron   Hyperlipidemia -Continue Pravastatin   Advance care planning: DNR/DNI. -See discussion from 1/30.  There is no height or weight on file to calculate BMI.          DVT prophylaxis:  apixaban (ELIQUIS) tablet 2.5 mg Start: 01/21/24 1330 SCDs Start: 01/20/24 1608 SCDs Start: 01/19/24 0128 apixaban (ELIQUIS) tablet 2.5 mg  Code Status: DNR/DNI Family Communication: None at bedside today Level of care: Med-Surg Status is: Inpatient Remains inpatient appropriate because: Safe disposition/SNF   Final disposition: SNF Consultants:  Orthopedic surgery  35 minutes with more than 50% spent in reviewing records, counseling patient/family and coordinating care.   Sch Meds:  Scheduled Meds:  acetaminophen  650 mg Oral Q6H WA   apixaban  2.5 mg Oral BID   aspirin EC  81 mg Oral Daily   Chlorhexidine Gluconate Cloth  6 each Topical Q0600   cholecalciferol  2,000 Units Oral Daily   feeding supplement  237 mL Oral BID BM   mirtazapine  7.5 mg Oral QHS   pravastatin  40 mg Oral Daily   Continuous Infusions:   PRN Meds:.fentaNYL (SUBLIMAZE) injection, haloperidol, haloperidol lactate, hydrOXYzine, magnesium hydroxide, methocarbamol, metoCLOPramide **OR** metoCLOPramide (REGLAN) injection, ondansetron **OR** ondansetron (ZOFRAN) IV, oxyCODONE,  polyethylene glycol  Antimicrobials: Anti-infectives (From admission, onward)    Start     Dose/Rate Route Frequency Ordered Stop   01/20/24 1259  vancomycin (VANCOCIN) powder  Status:  Discontinued          As needed 01/20/24 1259 01/20/24 1313   01/20/24 0600  ceFAZolin (ANCEF) IVPB 2g/100 mL premix        2 g 200 mL/hr over 30 Minutes Intravenous On call to O.R. 01/19/24 1346 01/20/24 1222        I have personally reviewed the following labs and images: CBC: Recent Labs  Lab 01/19/24 0033 01/19/24 0358 01/20/24 0631 01/21/24 1136 01/22/24 0557 01/23/24 0442  WBC 13.0* 11.4* 9.4 13.2* 9.6 7.5  NEUTROABS 11.6*  --  8.8*  --   --   --   HGB 11.2* 10.0* 11.6* 10.8* 11.0* 10.1*  HCT 36.5 32.2* 36.2 35.0* 34.5* 31.6*  MCV 86.7 85.6 85.4 86.0 84.6 84.7  PLT 245 234 244 250 232 227   BMP &GFR Recent Labs  Lab 01/19/24 0231 01/20/24 0631 01/21/24 1136 01/22/24 0557 01/23/24 0442  NA 136 140 141 136 140  K 4.3 4.0 4.4 3.8 3.8  CL 104 107 108 107 109  CO2 19* 20*  23 21* 23  GLUCOSE 176* 139* 94 107* 92  BUN 19 20 28* 30* 26*  CREATININE 1.42* 1.31* 1.42* 1.43* 1.22*  CALCIUM 8.2* 8.7* 8.6* 8.0* 8.0*  MG  --   --  2.1 2.0 2.1  PHOS  --   --   --  2.6 2.8   CrCl cannot be calculated (Unknown ideal weight.). Liver & Pancreas: Recent Labs  Lab 01/22/24 0557 01/23/24 0442  ALBUMIN 2.5* 2.2*   No results for input(s): "LIPASE", "AMYLASE" in the last 168 hours. No results for input(s): "AMMONIA" in the last 168 hours. Diabetic: No results for input(s): "HGBA1C" in the last 72 hours. No results for input(s): "GLUCAP" in the last 168 hours. Cardiac Enzymes: No results for input(s): "CKTOTAL", "CKMB", "CKMBINDEX", "TROPONINI" in the last 168 hours. No results for input(s): "PROBNP" in the last 8760 hours. Coagulation Profile: No results for input(s): "INR", "PROTIME" in the last 168 hours. Thyroid Function Tests: No results for input(s): "TSH", "T4TOTAL",  "FREET4", "T3FREE", "THYROIDAB" in the last 72 hours. Lipid Profile: No results for input(s): "CHOL", "HDL", "LDLCALC", "TRIG", "CHOLHDL", "LDLDIRECT" in the last 72 hours. Anemia Panel: No results for input(s): "VITAMINB12", "FOLATE", "FERRITIN", "TIBC", "IRON", "RETICCTPCT" in the last 72 hours. Urine analysis:    Component Value Date/Time   COLORURINE YELLOW 12/08/2022 1917   APPEARANCEUR CLEAR 12/08/2022 1917   LABSPEC 1.017 12/08/2022 1917   PHURINE 5.0 12/08/2022 1917   GLUCOSEU NEGATIVE 12/08/2022 1917   HGBUR MODERATE (A) 12/08/2022 1917   BILIRUBINUR NEGATIVE 12/08/2022 1917   KETONESUR 20 (A) 12/08/2022 1917   PROTEINUR NEGATIVE 12/08/2022 1917   NITRITE NEGATIVE 12/08/2022 1917   LEUKOCYTESUR SMALL (A) 12/08/2022 1917   Sepsis Labs: Invalid input(s): "PROCALCITONIN", "LACTICIDVEN"  Microbiology: Recent Results (from the past 240 hours)  Surgical pcr screen     Status: Abnormal   Collection Time: 01/19/24  2:07 PM   Specimen: Nasal Mucosa; Nasal Swab  Result Value Ref Range Status   MRSA, PCR NEGATIVE NEGATIVE Final   Staphylococcus aureus POSITIVE (A) NEGATIVE Final    Comment: (NOTE) The Xpert SA Assay (FDA approved for NASAL specimens in patients 23 years of age and older), is one component of a comprehensive surveillance program. It is not intended to diagnose infection nor to guide or monitor treatment. Performed at [facility], [address] [place], [place] [address]     Radiology Studies: DG CHEST PORT 1 VIEW Result Date: 01/24/2024 CLINICAL DATA:  Dyspnea. EXAM: PORTABLE CHEST 1 VIEW COMPARISON:  January 18, 2024. FINDINGS: Stable cardiomediastinal silhouette. Old left rib fractures are noted. Stable right middle lobe pleural base mass is again noted. Mild left basilar atelectasis or scarring is noted with associated pleural thickening or possible small effusion. IMPRESSION: Stable left basilar opacity as noted above. Stable right middle lobe  pleural based mass. Aortic Atherosclerosis (ICD10-I70.0). Electronically Signed   By: Lupita Raider M.D.   On: 01/24/2024 15:37       Elise Knobloch T. Markel Mergenthaler [facility]  If 7PM-7AM, please contact night-coverage [email or web address] 01/25/2024, 1:28 PM

## 2024-01-25 NOTE — Progress Notes (Signed)
Physical Therapy Treatment Patient Details Name: Leslie Carr MRN: [id number] DOB: [birth year] Today's Date: 01/25/2024   History of Present Illness Pt is a 89 y/o female who presents 01/18/2024 s/p unwitnessed fall at ALF. She sustained a R intertrochanteric femur fracture and is now s/p cephalomedullary nailing on 01/20/2024. PMH significant for dementia, breast CA s/p L mastectomy, CHF, CAD, mass of middle lobe R lung, CVA, pleural effusion on L.    PT Comments  Pt received in supine, lethargic but oriented to self, pt agreeable to therapy session with encouragement and clearance per RN after pt premedicated. Pt still reporting severe RLE pain with AAROM during bed mobility and transfers, and pt at times resistive to cues due to anxiety and fear of falls. Pt given reassurance and +2 maxA for transfers from elevated surface, defer RW today due to pt lethargy and used Stedy standing platform lift to pivot pt to/from [facility] as pt reports bowel urgency. RN to room for hygiene assist and pt safety after toileting. Pt remains anxious throughout due to fear of pain and fear of falls, despite presence of staff and frequent reassurance. Pt will continue to benefit from skilled rehab in a post acute setting < 3 hours per day to maximize functional gains before returning home, pt remains below her functional baseline.     If plan is discharge home, recommend the following: Two people to help with walking and/or transfers;A lot of help with bathing/dressing/bathroom;Assist for transportation;Help with stairs or ramp for entrance;Supervision due to cognitive status   Can travel by private vehicle     No  Equipment Recommendations  None recommended by PT (TBD, currently would need hoyer lift and wheelchair and hospital bed if home)    Recommendations for Other Services       Precautions / Restrictions Precautions Precautions: Fall Precaution Comments: no L arm BP (hx mastectomy) Restrictions Weight  Bearing Restrictions Per Provider Order: Yes RLE Weight Bearing Per Provider Order: Weight bearing as tolerated     Mobility  Bed Mobility Overal bed mobility: Needs Assistance Bed Mobility: Supine to Sit, Sit to Supine     Supine to sit: Max assist, +2 for physical assistance, HOB elevated, Used rails Sit to supine: Max assist, +2 for physical assistance   General bed mobility comments: Multimodal cues and use of bed pad for transition to EOB, and cues for use of bed rail and sequencing; poor carryover of cues as pt perseverating on pain and fear of falls.    Transfers Overall transfer level: Needs assistance Equipment used: 2 person hand held assist Transfers: Sit to/from Stand, Bed to chair/wheelchair/BSC Sit to Stand: Max assist, +2 physical assistance, From elevated surface           General transfer comment: +2 assist required for power up to full stand. Pt anxious and poor command following while standing due to pt concern for needing to have BM, so used Stedy to transfer to/from [facility] and back to EOB. Pt has difficulty standing upright more than a few seconds due to c/o RLE pain Transfer via Lift Equipment: Stedy  Ambulation/Gait               General Gait Details: Unable to progress to ambulation at this time due to RLE pain   Stairs             Wheelchair Mobility     Tilt Bed    Modified Rankin (Stroke Patients Only)       Balance  Overall balance assessment: Needs assistance Sitting-balance support: Feet supported, Bilateral upper extremity supported Sitting balance-Leahy Scale: Fair Sitting balance - Comments: CGA to minA   Standing balance support: During functional activity, Bilateral upper extremity supported Standing balance-Leahy Scale: Zero Standing balance comment: +2-3 required while standing for peri-care                            Cognition Arousal: Lethargic Behavior During Therapy: Anxious, Lability Overall  Cognitive Status: History of cognitive impairments - at baseline                                 General Comments: pt maintains eyes closed but opens them briefly to cues; oriented to self, expresses severe fear of falls, pt often requesting female staff members to assist instead; needs dense multimodal cues        Exercises Other Exercises Other Exercises: pt reports BLE too painful to perform AROM; AA for hip ab/adduction ~5 reps prior to getting to EOB    General Comments General comments (skin integrity, edema, etc.): pt unable to perform her own peri-care and +2 assist to stand, so RN assisted with hygiene while PTA and mobility specialist assisting her to stand to get to/from [facility]      Pertinent Vitals/Pain Pain Assessment Pain Assessment: Faces Faces Pain Scale: Hurts whole lot Pain Location: R hip, L arm with AAROM (to reach for UE support) Pain Descriptors / Indicators: Operative site guarding, Sore, Grimacing, Guarding, Moaning Pain Intervention(s): Limited activity within patient's tolerance, Monitored during session, Repositioned, Premedicated before session, Other (comment) (pt refusing ice pack for R hip pain)    Home Living                          Prior Function            PT Goals (current goals can now be found in the care plan section) Acute Rehab PT Goals Patient Stated Goal: Decrease pain PT Goal Formulation: With patient/family Time For Goal Achievement: 02/04/24 Progress towards PT goals: Progressing toward goals    Frequency    Min 1X/week      PT Plan         AM-PAC PT "6 Clicks" Mobility   Outcome Measure  Help needed turning from your back to your side while in a flat bed without using bedrails?: A Lot Help needed moving from lying on your back to sitting on the side of a flat bed without using bedrails?: Total Help needed moving to and from a bed to a chair (including a wheelchair)?: Total Help needed standing up  from a chair using your arms (e.g., wheelchair or bedside chair)?: Total Help needed to walk in hospital room?: Total Help needed climbing 3-5 steps with a railing? : Total 6 Click Score: 7    End of Session Equipment Utilized During Treatment: Gait belt Activity Tolerance: Patient limited by pain;Patient limited by lethargy Patient left: in bed;with call bell/phone within reach;with bed alarm set;Other (comment);with SCD's reapplied (pt LUE elevated due to some edema; pt heels floated) Nurse Communication: Mobility status;Need for lift equipment;Other (comment) (pain score) PT Visit Diagnosis: Unsteadiness on feet (R26.81);Pain Pain - Right/Left: Right Pain - part of body: Hip     Time: 9562-1308 PT Time Calculation (min) (ACUTE ONLY): 25 min  Charges:    $Therapeutic  Activity: 23-37 mins PT General Charges $$ ACUTE PT VISIT: 1 Visit                     Easton Fetty P., PTA Acute Rehabilitation Services Secure Chat Preferred 9a-5:30pm Office: [phone]    Dorathy Kinsman [facility] 01/25/2024, 3:39 PM

## 2024-01-25 NOTE — TOC Progression Note (Addendum)
Transition of Care [facility]) - Progression Note    Patient Details  Name: Leslie Carr MRN: [id number] Date of Birth: [birth year]  Transition of Care [facility]) CM/SW Contact  Lorri Frederick, LCSW Phone Number: 01/25/2024, 11:48 AM  Clinical Narrative:   CSW message with Kristin/[facility], no bed likely until the end of the week.  CSW spoke with pt, who requested CSW talk with her daughter Clydie Braun about bed offers.  CSW spoke with Clydie Braun, she reports [facility] told her possible bed soon.  Discussed other bed offers and Clydie Braun will look into these options.    1300: TC Karen.  She is at [facility] now and does want to accept offer here.  Nikki/[facility] confirmed she has bed.  New PT note needed for insurance auth.  PT is aware.   1600: SNF auth request submitted in [employer] and approved: [id number], 3 days: 2/4-2/6.    Expected Discharge Plan: Skilled Nursing Facility Barriers to Discharge: Continued Medical Work up, English as a second language teacher  Expected Discharge Plan and Services     Post Acute Care Choice: Skilled Nursing Facility Living arrangements for the past 2 months: Assisted Living Facility Expected Discharge Date: 01/25/24                                     Social Determinants of Health (SDOH) Interventions SDOH Screenings   Food Insecurity: Patient Unable To Answer (01/19/2024)  Housing: Patient Unable To Answer (01/19/2024)  Transportation Needs: Patient Unable To Answer (01/19/2024)  Utilities: Patient Unable To Answer (01/19/2024)  Financial Resource Strain: Low Risk  (09/19/2018)  Physical Activity: Sufficiently Active (09/19/2018)  Social Connections: Unknown (01/19/2024)  Stress: No Stress Concern Present (09/19/2018)  Tobacco Use: Medium Risk (01/20/2024)    Readmission Risk Interventions    12/11/2022    2:08 PM 12/10/2022   11:30 AM  Readmission Risk Prevention Plan  Transportation Screening Complete Complete  PCP or Specialist Appt within 5-7 Days  Complete   Home Care Screening  Complete  Medication Review (RN CM)  Complete  HRI or Home Care Consult Complete   Social Work Consult for Recovery Care Planning/Counseling Complete   Palliative Care Screening Not Applicable

## 2024-01-26 DIAGNOSIS — S72001D Fracture of unspecified part of neck of right femur, subsequent encounter for closed fracture with routine healing: Secondary | ICD-10-CM | POA: Diagnosis not present

## 2024-01-26 DIAGNOSIS — I63019 Cerebral infarction due to thrombosis of unspecified vertebral artery: Secondary | ICD-10-CM | POA: Diagnosis not present

## 2024-01-26 DIAGNOSIS — F0390 Unspecified dementia without behavioral disturbance: Secondary | ICD-10-CM | POA: Diagnosis not present

## 2024-01-26 DIAGNOSIS — I350 Nonrheumatic aortic (valve) stenosis: Secondary | ICD-10-CM | POA: Diagnosis not present

## 2024-01-26 MED ORDER — ENSURE ENLIVE PO LIQD: 237.0000 mL | Freq: Two times a day (BID) | ORAL | Status: AC

## 2024-01-26 MED ORDER — VITAMIN D3 25 MCG PO TABS: 2000.0000 [IU] | ORAL_TABLET | Freq: Every day | ORAL | Status: AC

## 2024-01-26 NOTE — Progress Notes (Signed)
 Mobility Specialist Progress Note:    01/26/24 0945  Mobility  Activity Transferred from chair to bed;Transferred to/from [facility]  Level of Assistance +2 (takes two people)  Press Photographer wheel walker;Stedy  Distance Ambulated (ft) 4 ft  RLE Weight Bearing Per Provider Order WBAT  Activity Response Tolerated well  Mobility visit 1 Mobility  Mobility Specialist Start Time (ACUTE ONLY) [id number]  Mobility Specialist Stop Time (ACUTE ONLY) 0940  Mobility Specialist Time Calculation (min) (ACUTE ONLY) 15 min   Pt received in chair, requesting assistance to [facility]. Able to stand and pivot w/ modA+2 and RW. Pt very fearful throughout. BM unsuccessful. Pt then able to stand into stedy w/ modA as NT assisted w/ peri care. Pt left in bed with call bell and NT present.  D'Vante Nicholaus Mobility Specialist Please contact via Special Educational Needs Teacher or Rehab office at [phone]

## 2024-01-26 NOTE — Discharge Planning (Signed)
Patient being transported to [facility], via ptar, discharge packet ready for ptar, IV access removed. Report given to nurse Atanza at [facility].

## 2024-01-26 NOTE — TOC Progression Note (Signed)
 Transition of Care [facility]) - Progression Note    Patient Details  Name: Leslie Carr MRN: [id number] Date of Birth: Mar 23, 1931  Transition of Care [facility]) CM/SW Contact  Bridget Cordella Simmonds, LCSW Phone Number: 01/26/2024, 2:19 PM  Clinical Narrative:   CSW confirmed with Nikki/[facility] that they can receive pt today.    CSW informed by RN pt needs to void prior to DC.  1415: pt cleared for DC.    Expected Discharge Plan: Skilled Nursing Facility Barriers to Discharge: Continued Medical Work up, English As A Second Language Teacher  Expected Discharge Plan and Services     Post Acute Care Choice: Skilled Nursing Facility Living arrangements for the past 2 months: Assisted Living Facility Expected Discharge Date: 01/26/24                                     Social Determinants of Health (SDOH) Interventions SDOH Screenings   Food Insecurity: Patient Unable To Answer (01/19/2024)  Housing: Patient Unable To Answer (01/19/2024)  Transportation Needs: Patient Unable To Answer (01/19/2024)  Utilities: Patient Unable To Answer (01/19/2024)  Financial Resource Strain: Low Risk  (09/19/2018)  Physical Activity: Sufficiently Active (09/19/2018)  Social Connections: Moderately Integrated (01/25/2024)  Stress: No Stress Concern Present (09/19/2018)  Tobacco Use: Medium Risk (01/20/2024)    Readmission Risk Interventions    12/11/2022    2:08 PM 12/10/2022   11:30 AM  Readmission Risk Prevention Plan  Transportation Screening Complete Complete  PCP or Specialist Appt within 5-7 Days  Complete  Home Care Screening  Complete  Medication Review (RN CM)  Complete  HRI or Home Care Consult Complete   Social Work Consult for Recovery Care Planning/Counseling Complete   Palliative Care Screening Not Applicable

## 2024-01-26 NOTE — Discharge Summary (Signed)
 Physician Discharge Summary  [name] FMW:[id number] DOB: [birth year] DOA: 01/18/2024  PCP: Pcp, No  Admit date: 01/18/2024 Discharge date: 01/26/24  Admitted From: ALF Disposition: SNF Recommendations for Outpatient Follow-up:  Outpatient follow-up as below Check CMP and CBC in 1 week Please follow up on the following pending results: None   Discharge Condition: Stable CODE STATUS: DNR/DNI  Follow-up Information     Connect with your PCP/Specialist as discussed. Schedule an appointment as soon as possible for a visit .   Contact information: [email or web address] Call our physician referral line at [phone].        [name], [name] SQUIBB, MD. Schedule an appointment as soon as possible for a visit in 2 week(s).   Specialty: Orthopedic Surgery Contact information: [address] [place] [address] [phone]                 Hospital course 89 year old F with PMH of dementia, systolic CHF, heart murmur, DVT/A-fib on Eliquis , CAD, HLD and TIA/CVA brought to ED from ALF due to right hip pain after witnessed ground-level fall, and admitted with impacted intertrochanteric fracture of right femur as noted on x-ray.  Orthopedic surgery was consulted and the patient was admitted by [facility]. She was observed to have a 4/6 murmur with radiation to the carotids. Her last echocardiogram was in 2021. It was repeated. The echocardiogram performed on 01/19/2024 demonstrated an EF of 25-30% which represents a large drop from the EF 4 years ago of 60-65%. It also demonstrates a worsening of her AS from midl to moderate in 2021 to severe with a mean gradient of 22 mmHg, peak gradient of 35.4, and aortic valve area of 0.77 cm2.   The patient underwent Cephalomedullary nailing of the right intertrochanteric femur fracture on 01/20/2024. In the PACU the patient had a short period of tachycardia, tachypnea, and increased oxygen requirements. This  resolved and she was stable at the time that she left the PACU.    Patient has had intermittent confusion and agitation.  She required Zyprexa  on 1/29 and Haldol  on 1/30.  She did not require further as needed medications.  She is discharged to SNF per recommendation by therapy.  Patient follow-up with orthopedic surgery in 2 weeks.  See individual problem list below for more.   Problems addressed during this hospitalization Witnessed ground level fall at assisted living facility Impacted right femur fracture due to fall -S/P cephalomedullary nailing.  -Pain management per orthopedic surgery. -Already on Eliquis  which would serve VTE prophylaxis -Continue PT/OT at SNF -Outpatient follow-up with orthopedic surgery   Acute metabolic encephalopathy/dementia with behavioral disturbance: Has had intermittent confusion and agitation.  Required Zyprexa  on 1/29 and Haldol  on 1/30.  Awake and alert.  Sitting on bedside chair.  Oriented to self and hospital.  Follows commands. -Reorientation and delirium precaution.   Chronic systolic CHF/severe PAH: TTE with LVEF of 20 to 25%, GH, G1 DD, severe AAS and RVSP of 60 mmHg.  Discussed this with patient's daughter and granddaughter at bedside.  DNR trusted in invasive procedure or evaluationPatient has no cardiopulmonary symptoms at the moment.     Severe aortic stenosis: Not a candidate for valve replacement given 89 and dementia.  Discussed with patient's daughter who is in agreement.   Paroxysmal A-fib: Appears to be in sinus rhythm.  Not on rate or rhythm control meds. -Continue Eliquis  for anticoagulation   Hyperlipidemia/history of CVA: No residual deficit. -Continue home aspirin  and statin.   CKD-3B: Stable.  Hypotension: Resolved.   Insomnia Continue Remeron    Hyperlipidemia -Continue Pravastatin    Advance care planning: DNR/DNI after discussion with patient's daughter.            Time spent 35 minutes  Vital  signs Vitals:   01/25/24 2247 01/25/24 2327 01/26/24 0441 01/26/24 0705  BP: (!) 102/90 (!) 120/90 (!) 90/51 98/67  Pulse: 77 77  77  Temp: 98.5 F (36.9 C) 98.4 F (36.9 C) 98.7 F (37.1 C) 97.6 F (36.4 C)  Resp: 16 16 18    SpO2: 92%  94% 90%  TempSrc: Oral Oral Oral Oral     Discharge exam  GENERAL: No apparent distress.  Nontoxic. HEENT: MMM.  Vision and hearing grossly intact.  NECK: Supple.  No apparent JVD.  RESP:  No IWOB.  Fair aeration bilaterally. CVS: RRR.  3/6 SEM over RUSB and LUSB. ABD/GI/GU: BS+. Abd soft, NTND.  MSK/EXT:  Moves extremities. No apparent deformity. No edema.  Dressing over right hip DCI. SKIN: Dressing over right hip/thigh DCI. NEURO: Sleepy but wakes to voice.  Oriented to self.  Follows commands.  Moves extremities.  No apparent focal neuro deficit. PSYCH: Calm. Normal affect.    Discharge Instructions Discharge Instructions     Diet general   Complete by: As directed    Increase activity slowly   Complete by: As directed       Allergies as of 01/26/2024       Reactions   Lipitor [atorvastatin] Other (See Comments)   Dizzy and make her head feel huge         Medication List     STOP taking these medications    docusate sodium  100 MG capsule Commonly known as: COLACE   Nyamyc powder Generic drug: nystatin       TAKE these medications    acetaminophen  325 MG tablet Commonly known as: TYLENOL  Take 650 mg by mouth every 6 (six) hours as needed for mild pain or fever. For pain when needed.   aspirin  EC 81 MG tablet Take 1 tablet (81 mg total) by mouth daily.   chlorhexidine  4 % external liquid Commonly known as: HIBICLENS  Apply 15 mLs (1 Application total) topically as directed for 30 doses. Use as directed daily for 5 days every other week for 6 weeks.   Eliquis  2.5 MG Tabs tablet Generic drug: apixaban  Take 2.5 mg by mouth 2 (two) times daily.   feeding supplement Liqd Take 237 mLs by mouth 2 (two) times daily  between meals.   ferrous sulfate  325 (65 FE) MG tablet Take 325 mg by mouth daily with breakfast.   Metamucil 0.52 g capsule Generic drug: psyllium Take 1 capsule (0.52 g total) by mouth daily.   methocarbamol  500 MG tablet Commonly known as: ROBAXIN  Take 1 tablet (500 mg total) by mouth every 8 (eight) hours as needed for muscle spasms.   mirtazapine  7.5 MG tablet Commonly known as: REMERON  Take 7.5 mg by mouth at bedtime.   multivitamin with minerals Tabs tablet Take 1 tablet by mouth daily.   mupirocin  ointment 2 % Commonly known as: BACTROBAN  Place 1 Application into the nose 2 (two) times daily for 60 doses. Use as directed 2 times daily for 5 days every other week for 6 weeks.   oxyCODONE  5 MG immediate release tablet Commonly known as: Oxy IR/ROXICODONE  Take 0.5-1 tablets (2.5-5 mg total) by mouth every 4 (four) hours as needed for moderate pain (pain score 4-6) or severe pain (pain score  7-10).   polyethylene glycol 17 g packet Commonly known as: MiraLax  Take 17 g by mouth daily.   pravastatin  40 MG tablet Commonly known as: PRAVACHOL  Take 40 mg by mouth daily.   triamcinolone ointment 0.5 % Commonly known as: KENALOG Apply 1 Application topically 2 (two) times daily as needed (irritation).   vitamin C  250 MG tablet Commonly known as: ASCORBIC ACID  Take 250 mg by mouth every other day.   vitamin D3 25 MCG tablet Commonly known as: CHOLECALCIFEROL  Take 2 tablets (2,000 Units total) by mouth daily. Start taking on: January 27, 2024        Consultations: Orthopedic surgery  Procedures/Studies: 1/29-Cephalomedullary nailing of the right intertrochanteric femur fracture by Dr. Kendal on 01/20/2024    DG CHEST PORT 1 VIEW Result Date: 01/24/2024 CLINICAL DATA:  Dyspnea. EXAM: PORTABLE CHEST 1 VIEW COMPARISON:  January 18, 2024. FINDINGS: Stable cardiomediastinal silhouette. Old left rib fractures are noted. Stable right middle lobe pleural base mass is  again noted. Mild left basilar atelectasis or scarring is noted with associated pleural thickening or possible small effusion. IMPRESSION: Stable left basilar opacity as noted above. Stable right middle lobe pleural based mass. Aortic Atherosclerosis (ICD10-I70.0). Electronically Signed   By: Lynwood Landy Raddle M.D.   On: 01/24/2024 15:37   DG FEMUR, MIN 2 VIEWS RIGHT Result Date: 01/20/2024 CLINICAL DATA:  Elective surgery. EXAM: RIGHT FEMUR 2 VIEWS COMPARISON:  Preoperative imaging. FINDINGS: Five fluoroscopic spot views of the right proximal femur obtained in the operating room. Femoral intramedullary nail with trans trochanteric and distal locking screw fixation traverse proximal femur fracture. Fluoroscopy time 50.2 seconds. Dose 5.26 mGy. IMPRESSION: Procedural fluoroscopy during right proximal femur fracture fixation. Electronically Signed   By: Andrea Gasman M.D.   On: 01/20/2024 15:59   DG HIP PORT UNILAT W OR W/O PELVIS 1V RIGHT Result Date: 01/20/2024 CLINICAL DATA:  Status post right femoral intramedullary nail. EXAM: DG HIP (WITH OR WITHOUT PELVIS) 1V PORT RIGHT COMPARISON:  Intraoperative fluoroscopy right hip 01/20/2024, pelvis and right hip radiographs 01/18/2024, CT right hip 01/19/2024 FINDINGS: There is diffuse decreased bone mineralization. Interval cephalomedullary nail fixation of the previously seen intertrochanteric fracture of the proximal right femur. Improved, now normal alignment. Mild persistent fracture line lucency extending through the superior aspect of the greater trochanter. No perihardware lucency is seen to indicate hardware failure or loosening. Minimal superomedial bilateral femoroacetabular joint space narrowing. Moderate pubic symphysis joint space narrowing. Expected postoperative subcutaneous air within the lateral right hip and thigh. Moderate atherosclerotic calcifications. Vascular phleboliths overlie the pelvis. IMPRESSION: Interval cephalomedullary nail fixation  of the previously seen intertrochanteric fracture of the proximal right femur. Improved, now normal alignment. No evidence of hardware failure. Electronically Signed   By: Tanda Lyons M.D.   On: 01/20/2024 14:46   DG C-Arm 1-60 Min-No Report Result Date: 01/20/2024 Fluoroscopy was utilized by the requesting physician.  No radiographic interpretation.   ECHOCARDIOGRAM COMPLETE Result Date: 01/20/2024    ECHOCARDIOGRAM REPORT   Patient Name:   Leslie Carr [facility] Date of Exam: 01/20/2024 Medical Rec #:  [id number]        Height:       63.0 in Accession #:    [id number]       Weight:       130.0 lb Date of Birth:  [birth year]        BSA:          1.610 m Patient Age:    89 years  BP:           153/89 mmHg Patient Gender: F                HR:           83 bpm. Exam Location:  Inpatient Procedure: 2D Echo, Cardiac Doppler, Color Doppler and Intracardiac            Opacification Agent Indications:    Aortic Stenosis  History:        Patient has prior history of Echocardiogram examinations, most                 recent 02/04/2020. Risk Factors:Dyslipidemia and Former Smoker.  Sonographer:    Ozell Free Referring Phys: [phone] AVA SWAYZE  Sonographer Comments: Technically challenging study due to limited acoustic windows. Image acquisition challenging due to uncooperative patient. IMPRESSIONS  1. Left ventricular ejection fraction, by estimation, is 25 to 30%. The left ventricle has severely decreased function. The left ventricle demonstrates global hypokinesis. There is mild concentric left ventricular hypertrophy. Left ventricular diastolic  parameters are consistent with Grade I diastolic dysfunction (impaired relaxation).  2. Right ventricular systolic function is normal. The right ventricular size is normal. There is moderately elevated pulmonary artery systolic pressure. The estimated right ventricular systolic pressure is 59.6 mmHg.  3. Left atrial size was moderately dilated.  4. The mitral valve is  degenerative. Mild mitral valve regurgitation. Mild to moderate mitral stenosis. The mean mitral valve gradient is 5.0 mmHg, MVA 1.92 cm^2. Moderate mitral annular calcification.  5. The aortic valve is tricuspid. There is severe calcifcation of the aortic valve. Aortic valve regurgitation is not visualized. Severe low flow low gradient aortic valve stenosis. Aortic valve area, by VTI measures 0.77 cm. Aortic valve mean gradient  measures 22.0 mmHg.  6. The inferior vena cava is dilated in size with <50% respiratory variability, suggesting right atrial pressure of 15 mmHg. FINDINGS  Left Ventricle: Left ventricular ejection fraction, by estimation, is 25 to 30%. The left ventricle has severely decreased function. The left ventricle demonstrates global hypokinesis. Definity  contrast agent was given IV to delineate the left ventricular endocardial borders. The left ventricular internal cavity size was normal in size. There is mild concentric left ventricular hypertrophy. Left ventricular diastolic parameters are consistent with Grade I diastolic dysfunction (impaired relaxation). Right Ventricle: The right ventricular size is normal. No increase in right ventricular wall thickness. Right ventricular systolic function is normal. There is moderately elevated pulmonary artery systolic pressure. The tricuspid regurgitant velocity is 3.34 m/s, and with an assumed right atrial pressure of 15 mmHg, the estimated right ventricular systolic pressure is 59.6 mmHg. Left Atrium: Left atrial size was moderately dilated. Right Atrium: Right atrial size was normal in size. Pericardium: There is no evidence of pericardial effusion. Mitral Valve: The mitral valve is degenerative in appearance. There is moderate calcification of the mitral valve leaflet(s). Moderate mitral annular calcification. Mild mitral valve regurgitation. Mild to moderate mitral valve stenosis. MV peak gradient, 8.2 mmHg. The mean mitral valve gradient is 5.0  mmHg. Tricuspid Valve: The tricuspid valve is normal in structure. Tricuspid valve regurgitation is mild. Aortic Valve: The aortic valve is tricuspid. There is severe calcifcation of the aortic valve. Aortic valve regurgitation is not visualized. Aortic regurgitation PHT measures 293 msec. Severe aortic stenosis is present. Aortic valve mean gradient measures 22.0 mmHg. Aortic valve peak gradient measures 35.4 mmHg. Aortic valve area, by VTI measures 0.77 cm. Pulmonic Valve: The pulmonic valve was  normal in structure. Pulmonic valve regurgitation is not visualized. Aorta: The aortic root is normal in size and structure. Venous: The inferior vena cava is dilated in size with less than 50% respiratory variability, suggesting right atrial pressure of 15 mmHg. IAS/Shunts: No atrial level shunt detected by color flow Doppler.  LEFT VENTRICLE PLAX 2D LVIDd:         5.00 cm     Diastology LVIDs:         4.00 cm     LV e' medial:    4.20 cm/s LV PW:         1.10 cm     LV E/e' medial:  27.6 LV IVS:        1.10 cm     LV e' lateral:   10.10 cm/s LVOT diam:     2.00 cm     LV E/e' lateral: 11.5 LV SV:         50 LV SV Index:   31 LVOT Area:     3.14 cm  LV Volumes (MOD) LV vol d, MOD A2C: 86.6 ml LV vol d, MOD A4C: 97.5 ml LV vol s, MOD A2C: 72.6 ml LV vol s, MOD A4C: 75.6 ml LV SV MOD A2C:     14.0 ml LV SV MOD A4C:     97.5 ml LV SV MOD BP:      17.8 ml RIGHT VENTRICLE             IVC RV Basal diam:  3.90 cm     IVC diam: 2.50 cm RV S prime:     12.30 cm/s TAPSE (M-mode): 2.5 cm LEFT ATRIUM              Index        RIGHT ATRIUM           Index LA diam:        4.70 cm  2.92 cm/m   RA Area:     14.80 cm LA Vol (A2C):   117.0 ml 72.66 ml/m  RA Volume:   42.80 ml  26.58 ml/m LA Vol (A4C):   79.6 ml  49.44 ml/m LA Biplane Vol: 94.6 ml  58.75 ml/m  AORTIC VALVE AV Area (Vmax):    0.70 cm AV Area (Vmean):   0.66 cm AV Area (VTI):     0.77 cm AV Vmax:           297.33 cm/s AV Vmean:          213.667 cm/s AV VTI:             0.652 m AV Peak Grad:      35.4 mmHg AV Mean Grad:      22.0 mmHg LVOT Vmax:         66.50 cm/s LVOT Vmean:        44.600 cm/s LVOT VTI:          0.160 m LVOT/AV VTI ratio: 0.25 AI PHT:            293 msec  AORTA Ao Root diam: 3.20 cm MITRAL VALVE                TRICUSPID VALVE MV Area (PHT): 4.49 cm     TR Peak grad:   44.6 mmHg MV Area VTI:   1.92 cm     TR Vmax:        334.00 cm/s MV Peak grad:  8.2 mmHg MV Mean grad:  5.0 mmHg     SHUNTS MV Vmax:       1.43 m/s     Systemic VTI:  0.16 m MV Vmean:      109.0 cm/s   Systemic Diam: 2.00 cm MV Decel Time: 169 msec MR Peak grad: 85.4 mmHg MR Vmax:      462.00 cm/s MV E velocity: 116.00 cm/s MV A velocity: 124.00 cm/s MV E/A ratio:  0.94 Dalton McleanMD Electronically signed by Ezra Kanner Signature Date/Time: 01/20/2024/11:58:18 AM    Final    CT HEAD WO CONTRAST ( ) Result Date: 01/19/2024 CLINICAL DATA:  Head trauma, minor (Age >= 65y); Polytrauma, blunt. EXAM: CT HEAD WITHOUT CONTRAST CT CERVICAL SPINE WITHOUT CONTRAST TECHNIQUE: Multidetector CT imaging of the head and cervical spine was performed following the standard protocol without intravenous contrast. Multiplanar CT image reconstructions of the cervical spine were also generated. RADIATION DOSE REDUCTION: This exam was performed according to the departmental dose-optimization program which includes automated exposure control, adjustment of the mA and/or kV according to patient size and/or use of iterative reconstruction technique. COMPARISON:  CT head and CT cervical spine 12/09/2022. FINDINGS: CT HEAD FINDINGS Motion limited. Brain: No evidence of acute infarction, hemorrhage, hydrocephalus, extra-axial collection or mass lesion/mass effect. Chronic microvascular ischemic change, similar. Vascular: No hyperdense vessel. Skull: No acute fracture. Sinuses/Orbits: Clear sinuses.  No acute orbital findings. Other: No mastoid effusions. CT CERVICAL SPINE FINDINGS Alignment: Similar mild in  anterolisthesis of C3 on C4. No substantial sagittal subluxation. Skull base and vertebrae: No evidence of acute fracture. Soft tissues and spinal canal: No prevertebral fluid or swelling. No visible canal hematoma. Disc levels:  Moderate multilevel degenerative change. Upper chest: Chronic changes in the left lung apex, similar to prior CT chest. IMPRESSION: No evidence of acute abnormality intracranially in the cervical spine. Electronically Signed   By: Gilmore GORMAN Molt M.D.   On: 01/19/2024 00:57   CT CERVICAL SPINE WO CONTRAST Result Date: 01/19/2024 CLINICAL DATA:  Head trauma, minor (Age >= 65y); Polytrauma, blunt. EXAM: CT HEAD WITHOUT CONTRAST CT CERVICAL SPINE WITHOUT CONTRAST TECHNIQUE: Multidetector CT imaging of the head and cervical spine was performed following the standard protocol without intravenous contrast. Multiplanar CT image reconstructions of the cervical spine were also generated. RADIATION DOSE REDUCTION: This exam was performed according to the departmental dose-optimization program which includes automated exposure control, adjustment of the mA and/or kV according to patient size and/or use of iterative reconstruction technique. COMPARISON:  CT head and CT cervical spine 12/09/2022. FINDINGS: CT HEAD FINDINGS Motion limited. Brain: No evidence of acute infarction, hemorrhage, hydrocephalus, extra-axial collection or mass lesion/mass effect. Chronic microvascular ischemic change, similar. Vascular: No hyperdense vessel. Skull: No acute fracture. Sinuses/Orbits: Clear sinuses.  No acute orbital findings. Other: No mastoid effusions. CT CERVICAL SPINE FINDINGS Alignment: Similar mild in anterolisthesis of C3 on C4. No substantial sagittal subluxation. Skull base and vertebrae: No evidence of acute fracture. Soft tissues and spinal canal: No prevertebral fluid or swelling. No visible canal hematoma. Disc levels:  Moderate multilevel degenerative change. Upper chest: Chronic changes in the  left lung apex, similar to prior CT chest. IMPRESSION: No evidence of acute abnormality intracranially in the cervical spine. Electronically Signed   By: Gilmore GORMAN Molt M.D.   On: 01/19/2024 00:57   CT Hip Right Wo Contrast Result Date: 01/19/2024 CLINICAL DATA:  EVALUATE FOR FRACTURE. EXAM: CT OF THE RIGHT HIP WITHOUT CONTRAST TECHNIQUE: Multidetector CT imaging of the right hip was performed according to the  standard protocol. Multiplanar CT image reconstructions were also generated. RADIATION DOSE REDUCTION: This exam was performed according to the departmental dose-optimization program which includes automated exposure control, adjustment of the mA and/or kV according to patient size and/or use of iterative reconstruction technique. COMPARISON:  X-ray same day. CT angiogram chest abdomen and pelvis 03/26/2023. FINDINGS: Bones/Joint/Cartilage Bones are diffusely osteopenic. There is a comminuted intratrochanteric fracture. The fracture appears impacted with apex anterior angulation. There is no significant displacement or dislocation. The bones are diffusely osteopenic. There are mild degenerative changes of the right hip with joint space narrowing and subchondral cystic change. Ligaments Suboptimally assessed by CT. Muscles and Tendons There is intramuscular edema surrounding the fracture. Soft tissues No focal hematoma or fluid collection identified. Colonic diverticulosis is present. There are atherosclerotic calcifications of the aorta and iliac arteries. IMPRESSION: 1. Comminuted, impacted, and angulated intratrochanteric fracture of the right femur. 2. Diffuse osteopenia. 3. Colonic diverticulosis. 4. Aortic atherosclerosis. Aortic Atherosclerosis (ICD10-I70.0). Electronically Signed   By: Greig Pique M.D.   On: 01/19/2024 00:44   DG Chest Portable 1 View Result Date: 01/18/2024 CLINICAL DATA:  Status post fall with right hip pain. EXAM: PORTABLE CHEST 1 VIEW COMPARISON:  Radiograph in CT  03/26/2023 FINDINGS: Stable heart size and mediastinal contours. Aortic atherosclerosis. Assessment of the left mid lower lung zone is limited due to overlying artifact, possibly related to patient's bra. There is vascular congestion without overt edema. The right middle lobe partially calcified mass on prior CT is only faintly visualized by radiograph, but grossly stable. No pneumothorax or large pleural effusion. IMPRESSION: 1. Left lung assessment is limited due to overlying artifact. 2. Vascular congestion without edema.  Stable prominent heart size. 3. Chronic chronic partially calcified right middle lobe lesion is faintly visualized. Electronically Signed   By: Andrea Gasman M.D.   On: 01/18/2024 22:57   DG Hip Unilat W or Wo Pelvis 2-3 Views Right Result Date: 01/18/2024 CLINICAL DATA:  Fall EXAM: DG HIP (WITH OR WITHOUT PELVIS) 2-3V RIGHT COMPARISON:  CT chest abdomen and pelvis 03/26/2023. FINDINGS: The right femoral neck appears slightly foreshortened and a subtle impacted fracture can not be excluded. No discrete fracture line visualized. There is no dislocation. Joint spaces are maintained. There are vascular calcifications in the lower extremities and pelvis. IMPRESSION: The right femoral neck appears slightly foreshortened and a subtle impacted fracture can not be excluded. Recommend further evaluation with CT. Electronically Signed   By: Greig Pique M.D.   On: 01/18/2024 22:53       The results of significant diagnostics from this hospitalization (including imaging, microbiology, ancillary and laboratory) are listed below for reference.     Microbiology: Recent Results (from the past 240 hours)  Surgical pcr screen     Status: Abnormal   Collection Time: 01/19/24  2:07 PM   Specimen: Nasal Mucosa; Nasal Swab  Result Value Ref Range Status   MRSA, PCR NEGATIVE NEGATIVE Final   Staphylococcus aureus POSITIVE (A) NEGATIVE Final    Comment: (NOTE) The Xpert SA Assay (FDA approved  for NASAL specimens in patients 69 years of age and older), is one component of a comprehensive surveillance program. It is not intended to diagnose infection nor to guide or monitor treatment. Performed at [facility], [address] [place], [place] [address]      Labs:  CBC: Recent Labs  Lab 01/20/24 0631 01/21/24 1136 01/22/24 0557 01/23/24 0442  WBC 9.4 13.2* 9.6 7.5  NEUTROABS 8.8*  --   --   --  HGB 11.6* 10.8* 11.0* 10.1*  HCT 36.2 35.0* 34.5* 31.6*  MCV 85.4 86.0 84.6 84.7  PLT 244 250 232 227   BMP &GFR Recent Labs  Lab 01/20/24 0631 01/21/24 1136 01/22/24 0557 01/23/24 0442  NA 140 141 136 140  K 4.0 4.4 3.8 3.8  CL 107 108 107 109  CO2 20* 23 21* 23  GLUCOSE 139* 94 107* 92  BUN 20 28* 30* 26*  CREATININE 1.31* 1.42* 1.43* 1.22*  CALCIUM  8.7* 8.6* 8.0* 8.0*  MG  --  2.1 2.0 2.1  PHOS  --   --  2.6 2.8   CrCl cannot be calculated (Unknown ideal weight.). Liver & Pancreas: Recent Labs  Lab 01/22/24 0557 01/23/24 0442  ALBUMIN  2.5* 2.2*   No results for input(s): LIPASE, AMYLASE in the last 168 hours. No results for input(s): AMMONIA in the last 168 hours. Diabetic: No results for input(s): HGBA1C in the last 72 hours. No results for input(s): GLUCAP in the last 168 hours. Cardiac Enzymes: No results for input(s): CKTOTAL, CKMB, CKMBINDEX, TROPONINI in the last 168 hours. No results for input(s): PROBNP in the last 8760 hours. Coagulation Profile: No results for input(s): INR, PROTIME in the last 168 hours. Thyroid  Function Tests: No results for input(s): TSH, T4TOTAL, FREET4, T3FREE, THYROIDAB in the last 72 hours. Lipid Profile: No results for input(s): CHOL, HDL, LDLCALC, TRIG, CHOLHDL, LDLDIRECT in the last 72 hours. Anemia Panel: No results for input(s): VITAMINB12, FOLATE, FERRITIN, TIBC, IRON, RETICCTPCT in the last 72 hours. Urine analysis:    Component Value  Date/Time   COLORURINE YELLOW 12/08/2022 1917   APPEARANCEUR CLEAR 12/08/2022 1917   LABSPEC 1.017 12/08/2022 1917   PHURINE 5.0 12/08/2022 1917   GLUCOSEU NEGATIVE 12/08/2022 1917   HGBUR MODERATE (A) 12/08/2022 1917   BILIRUBINUR NEGATIVE 12/08/2022 1917   KETONESUR 20 (A) 12/08/2022 1917   PROTEINUR NEGATIVE 12/08/2022 1917   NITRITE NEGATIVE 12/08/2022 1917   LEUKOCYTESUR SMALL (A) 12/08/2022 1917   Sepsis Labs: Invalid input(s): PROCALCITONIN, LACTICIDVEN   SIGNED:  Jorge Retz T Chett Taniguchi, MD  [facility] 01/26/2024, 9:59 AM

## 2024-01-26 NOTE — Progress Notes (Signed)
 Occupational Therapy Treatment Patient Details Name: Leslie Carr MRN: [id number] DOB: 1931/11/22 Today's Date: 01/26/2024   History of present illness Pt is a 89 y/o female who presents 01/18/2024 s/p unwitnessed fall at ALF. She sustained a R intertrochanteric femur fracture and is now s/p cephalomedullary nailing on 01/20/2024. PMH significant for dementia, breast CA s/p L mastectomy, CHF, CAD, mass of middle lobe R lung, CVA, pleural effusion on L.   OT comments  Patient received in supine and agreeable to OT treatment. Patient required increased time to get to EOB due to fearful of pain and assistance with trunk and BLEs. Once on EOB patient is CGA for balance. Patient able to stand into Stedy with max assist of one and transfer to recliner. Once in recliner patient more relaxed and able to perform self care tasks with min assist. Patient demonstrating gains with bed mobility and transfers. Patient will benefit from continued inpatient follow up therapy, <3 hours/day. Acute OT to continue to follow to address established goals to facilitate DC to next venue of care.       If plan is discharge home, recommend the following:  Two people to help with walking and/or transfers;Two people to help with bathing/dressing/bathroom;Direct supervision/assist for medications management;Direct supervision/assist for financial management;Assist for transportation;Help with stairs or ramp for entrance;Supervision due to cognitive status   Equipment Recommendations  Other (comment) (defer)    Recommendations for Other Services      Precautions / Restrictions Precautions Precautions: Fall Precaution Comments: no L arm BP (hx mastectomy) Restrictions Weight Bearing Restrictions Per Provider Order: Yes RLE Weight Bearing Per Provider Order: Weight bearing as tolerated       Mobility Bed Mobility Overal bed mobility: Needs Assistance Bed Mobility: Supine to Sit     Supine to sit: Max assist, HOB  elevated, Used rails     General bed mobility comments: required assistance with BLEs and trunk with use of bed pad to scoot to EOB    Transfers Overall transfer level: Needs assistance Equipment used: Ambulation equipment used Transfers: Sit to/from Stand, Bed to chair/wheelchair/BSC Sit to Stand: Max assist, From elevated surface           General transfer comment: max assist to stand from raised bed and Stedy pads  with assitance at hips. Transfer via Lift Equipment: Stedy   Balance Overall balance assessment: Needs assistance Sitting-balance support: Feet supported, Bilateral upper extremity supported Sitting balance-Leahy Scale: Fair Sitting balance - Comments: once on EOB patient was CGA for balance   Standing balance support: During functional activity, Bilateral upper extremity supported Standing balance-Leahy Scale: Zero Standing balance comment: stedy for standing with max assist                           ADL either performed or assessed with clinical judgement   ADL Overall ADL's : Needs assistance/impaired     Grooming: Wash/dry hands;Wash/dry face;Oral care;Brushing hair;Minimal assistance;Set up;Sitting Grooming Details (indicate cue type and reason): setup and min assist with toothpaste         Upper Body Dressing : Minimal assistance;Sitting Upper Body Dressing Details (indicate cue type and reason): donned gown to cover back Lower Body Dressing: Maximal assistance;Sitting/lateral leans Lower Body Dressing Details (indicate cue type and reason): to donn socks Toilet Transfer: Maximal assistance Toilet Transfer Details (indicate cue type and reason): tx to recliner with Stedy and max assist of one  General ADL Comments: fearful of falling during transfers    Extremity/Trunk Assessment Upper Extremity Assessment LUE Deficits / Details: Frozen shoulder on the L, restricted extremity on the L 2 mastectomy LUE Coordination:  decreased gross motor            Vision       Perception     Praxis      Cognition Arousal: Alert Behavior During Therapy: Anxious Overall Cognitive Status: History of cognitive impairments - at baseline                                 General Comments: anxious over fear of falling, subsided once in recliner        Exercises      Shoulder Instructions       General Comments      Pertinent Vitals/ Pain       Pain Assessment Pain Assessment: Faces Faces Pain Scale: Hurts even more Pain Location: R hip, L arm with AAROM (to reach for UE support) Pain Descriptors / Indicators: Operative site guarding, Sore, Grimacing, Guarding, Moaning Pain Intervention(s): Limited activity within patient's tolerance, Monitored during session, Repositioned  Home Living                                          Prior Functioning/Environment              Frequency  Min 1X/week        Progress Toward Goals  OT Goals(current goals can now be found in the care plan section)  Progress towards OT goals: Progressing toward goals  Acute Rehab OT Goals Patient Stated Goal: not fall OT Goal Formulation: With patient Time For Goal Achievement: 02/04/24 Potential to Achieve Goals: Fair ADL Goals Pt Will Perform Grooming: with contact guard assist;sitting Pt Will Perform Upper Body Dressing: with set-up;sitting Pt Will Transfer to Toilet: with min assist;bedside commode;stand pivot transfer Pt Will Perform Toileting - Clothing Manipulation and hygiene: with mod assist;sitting/lateral leans;sit to/from stand Additional ADL Goal #1: Pt will follow 2 step commands with increased time and no more than minimal cueing.  Plan      Co-evaluation                 AM-PAC OT 6 Clicks Daily Activity     Outcome Measure   Help from another person eating meals?: A Little Help from another person taking care of personal grooming?: A Little Help  from another person toileting, which includes using toliet, bedpan, or urinal?: Total Help from another person bathing (including washing, rinsing, drying)?: A Lot Help from another person to put on and taking off regular upper body clothing?: A Little Help from another person to put on and taking off regular lower body clothing?: Total 6 Click Score: 13    End of Session Equipment Utilized During Treatment: Gait belt;Other (comment) Laurent)  OT Visit Diagnosis: Other abnormalities of gait and mobility (R26.89);Muscle weakness (generalized) (M62.81);Pain;Other symptoms and signs involving cognitive function Pain - Right/Left: Right Pain - part of body: Hip;Leg   Activity Tolerance Patient tolerated treatment well   Patient Left in chair;with call bell/phone within reach;with chair alarm set   Nurse Communication Mobility status;Need for lift equipment;Precautions        Time: 9173-9150 OT Time Calculation (min): 23 min  Charges:  OT General Charges $OT Visit: 1 Visit OT Treatments $Self Care/Home Management : 8-22 mins $Therapeutic Activity: 8-22 mins  Dick Carr, OTA Acute Rehabilitation Services  Office [phone]   Leslie Carr 01/26/2024, 11:03 AM

## 2024-01-26 NOTE — TOC Transition Note (Signed)
 Transition of Care [facility]) - Discharge Note   Patient Details  Name: Leslie Carr MRN: [id number] Date of Birth: September 13, 1931  Transition of Care [facility]) CM/SW Contact:  Bridget Cordella Simmonds, LCSW Phone Number: 01/26/2024, 2:31 PM   Clinical Narrative:  Pt discharging to [facility].  RN call report to [phone].      Final next level of care: Skilled Nursing Facility Barriers to Discharge: Barriers Resolved   Patient Goals and CMS Choice Patient states their goals for this hospitalization and ongoing recovery are:: patient unable to participate in goal setting, not fully oriented CMS Medicare.gov Compare Post Acute Care list provided to:: Patient Represenative (must comment) Choice offered to / list presented to : Adult Children [place] ownership interest in [facility].provided to:: Adult Children    Discharge Placement              Patient chooses bed at: [facility] Patient to be transferred to facility by: ptar Name of family member notified: daughter [name] Patient and family notified of of transfer: 01/26/24  Discharge Plan and Services Additional resources added to the After Visit Summary for       Post Acute Care Choice: Skilled Nursing Facility                               Social Drivers of Health (SDOH) Interventions SDOH Screenings   Food Insecurity: Patient Unable To Answer (01/19/2024)  Housing: Patient Unable To Answer (01/19/2024)  Transportation Needs: Patient Unable To Answer (01/19/2024)  Utilities: Patient Unable To Answer (01/19/2024)  Financial Resource Strain: Low Risk  (09/19/2018)  Physical Activity: Sufficiently Active (09/19/2018)  Social Connections: Moderately Integrated (01/25/2024)  Stress: No Stress Concern Present (09/19/2018)  Tobacco Use: Medium Risk (01/20/2024)     Readmission Risk Interventions    12/11/2022    2:08 PM 12/10/2022   11:30 AM  Readmission Risk Prevention Plan  Transportation  Screening Complete Complete  PCP or Specialist Appt within 5-7 Days  Complete  Home Care Screening  Complete  Medication Review (RN CM)  Complete  HRI or Home Care Consult Complete   Social Work Consult for Recovery Care Planning/Counseling Complete   Palliative Care Screening Not Applicable

## 2024-04-21 DEATH — deceased
